# Patient Record
Sex: Female | Born: 1955
Health system: Southern US, Community
[De-identification: ages and names within clinical notes are randomized; demographics above are authoritative.]

## PROBLEM LIST (undated history)

## (undated) DIAGNOSIS — I82403 Acute embolism and thrombosis of unspecified deep veins of lower extremity, bilateral: Secondary | ICD-10-CM

## (undated) DIAGNOSIS — N12 Tubulo-interstitial nephritis, not specified as acute or chronic: Secondary | ICD-10-CM

## (undated) DIAGNOSIS — J9601 Acute respiratory failure with hypoxia: Secondary | ICD-10-CM

## (undated) DIAGNOSIS — G43909 Migraine, unspecified, not intractable, without status migrainosus: Secondary | ICD-10-CM

## (undated) DIAGNOSIS — T827XXA Infection and inflammatory reaction due to other cardiac and vascular devices, implants and grafts, initial encounter: Secondary | ICD-10-CM

## (undated) DIAGNOSIS — J189 Pneumonia, unspecified organism: Secondary | ICD-10-CM

## (undated) DIAGNOSIS — I10 Essential (primary) hypertension: Secondary | ICD-10-CM

## (undated) DIAGNOSIS — E039 Hypothyroidism, unspecified: Secondary | ICD-10-CM

## (undated) DIAGNOSIS — N189 Chronic kidney disease, unspecified: Secondary | ICD-10-CM

## (undated) DIAGNOSIS — Z6839 Body mass index (BMI) 39.0-39.9, adult: Secondary | ICD-10-CM

## (undated) DIAGNOSIS — I509 Heart failure, unspecified: Secondary | ICD-10-CM

## (undated) DIAGNOSIS — R0600 Dyspnea, unspecified: Secondary | ICD-10-CM

## (undated) DIAGNOSIS — I2699 Other pulmonary embolism without acute cor pulmonale: Secondary | ICD-10-CM

## (undated) DIAGNOSIS — I639 Cerebral infarction, unspecified: Secondary | ICD-10-CM

## (undated) DIAGNOSIS — R079 Chest pain, unspecified: Secondary | ICD-10-CM

## (undated) DIAGNOSIS — R0602 Shortness of breath: Secondary | ICD-10-CM

## (undated) HISTORY — DX: Acute embolism and thrombosis of unspecified deep veins of lower extremity, bilateral: I82.403

## (undated) HISTORY — PX: CHOLECYSTECTOMY: SHX55

## (undated) HISTORY — DX: Pneumonia, unspecified organism: J18.9

## (undated) HISTORY — DX: Chest pain, unspecified: R07.9

## (undated) HISTORY — DX: Acute respiratory failure with hypoxia: J96.01

## (undated) HISTORY — DX: Shortness of breath: R06.02

## (undated) HISTORY — DX: Other pulmonary embolism without acute cor pulmonale: I26.99

## (undated) HISTORY — DX: Body mass index (BMI) 39.0-39.9, adult: Z68.39

## (undated) HISTORY — DX: Tubulo-interstitial nephritis, not specified as acute or chronic: N12

## (undated) HISTORY — DX: Infection and inflammatory reaction due to other cardiac and vascular devices, implants and grafts, initial encounter: T82.7XXA

---

## 2004-09-08 HISTORY — PX: CARDIAC CATHETERIZATION: SHX172

## 2015-05-16 DIAGNOSIS — L509 Urticaria, unspecified: Secondary | ICD-10-CM

## 2015-05-16 DIAGNOSIS — L299 Pruritus, unspecified: Secondary | ICD-10-CM | POA: Insufficient documentation

## 2015-05-16 HISTORY — DX: Urticaria, unspecified: L50.9

## 2015-05-16 HISTORY — DX: Pruritus, unspecified: L29.9

## 2015-06-18 ENCOUNTER — Ambulatory Visit: Payer: Medicare PPO | Admitting: Allergy and Immunology

## 2015-07-02 ENCOUNTER — Ambulatory Visit: Payer: Medicare PPO | Admitting: Allergy and Immunology

## 2015-11-08 DIAGNOSIS — M545 Low back pain: Secondary | ICD-10-CM | POA: Diagnosis not present

## 2015-11-08 DIAGNOSIS — R358 Other polyuria: Secondary | ICD-10-CM | POA: Diagnosis not present

## 2015-11-08 DIAGNOSIS — N3091 Cystitis, unspecified with hematuria: Secondary | ICD-10-CM | POA: Diagnosis not present

## 2015-11-21 DIAGNOSIS — K529 Noninfective gastroenteritis and colitis, unspecified: Secondary | ICD-10-CM | POA: Diagnosis not present

## 2015-11-22 DIAGNOSIS — R109 Unspecified abdominal pain: Secondary | ICD-10-CM | POA: Diagnosis not present

## 2015-11-22 DIAGNOSIS — K429 Umbilical hernia without obstruction or gangrene: Secondary | ICD-10-CM | POA: Diagnosis not present

## 2016-02-12 DIAGNOSIS — M542 Cervicalgia: Secondary | ICD-10-CM | POA: Diagnosis not present

## 2016-02-12 DIAGNOSIS — K861 Other chronic pancreatitis: Secondary | ICD-10-CM | POA: Diagnosis not present

## 2016-04-12 DIAGNOSIS — E785 Hyperlipidemia, unspecified: Secondary | ICD-10-CM | POA: Diagnosis not present

## 2016-04-12 DIAGNOSIS — N189 Chronic kidney disease, unspecified: Secondary | ICD-10-CM | POA: Diagnosis not present

## 2016-04-12 DIAGNOSIS — F419 Anxiety disorder, unspecified: Secondary | ICD-10-CM | POA: Diagnosis not present

## 2016-04-12 DIAGNOSIS — I509 Heart failure, unspecified: Secondary | ICD-10-CM | POA: Diagnosis not present

## 2016-04-12 DIAGNOSIS — N183 Chronic kidney disease, stage 3 (moderate): Secondary | ICD-10-CM | POA: Diagnosis not present

## 2016-04-12 DIAGNOSIS — R4182 Altered mental status, unspecified: Secondary | ICD-10-CM | POA: Diagnosis not present

## 2016-04-12 DIAGNOSIS — E7800 Pure hypercholesterolemia, unspecified: Secondary | ICD-10-CM | POA: Diagnosis not present

## 2016-04-12 DIAGNOSIS — E039 Hypothyroidism, unspecified: Secondary | ICD-10-CM | POA: Diagnosis not present

## 2016-04-12 DIAGNOSIS — I129 Hypertensive chronic kidney disease with stage 1 through stage 4 chronic kidney disease, or unspecified chronic kidney disease: Secondary | ICD-10-CM | POA: Diagnosis not present

## 2016-04-12 DIAGNOSIS — K219 Gastro-esophageal reflux disease without esophagitis: Secondary | ICD-10-CM | POA: Diagnosis not present

## 2016-04-12 DIAGNOSIS — I130 Hypertensive heart and chronic kidney disease with heart failure and stage 1 through stage 4 chronic kidney disease, or unspecified chronic kidney disease: Secondary | ICD-10-CM | POA: Diagnosis not present

## 2016-04-12 DIAGNOSIS — I639 Cerebral infarction, unspecified: Secondary | ICD-10-CM | POA: Diagnosis not present

## 2016-04-13 DIAGNOSIS — R9431 Abnormal electrocardiogram [ECG] [EKG]: Secondary | ICD-10-CM | POA: Diagnosis not present

## 2016-04-13 DIAGNOSIS — I639 Cerebral infarction, unspecified: Secondary | ICD-10-CM | POA: Diagnosis not present

## 2016-04-14 DIAGNOSIS — R4781 Slurred speech: Secondary | ICD-10-CM | POA: Diagnosis not present

## 2016-04-14 DIAGNOSIS — R079 Chest pain, unspecified: Secondary | ICD-10-CM | POA: Diagnosis not present

## 2016-04-22 DIAGNOSIS — E785 Hyperlipidemia, unspecified: Secondary | ICD-10-CM | POA: Diagnosis not present

## 2016-04-22 DIAGNOSIS — Z1389 Encounter for screening for other disorder: Secondary | ICD-10-CM | POA: Diagnosis not present

## 2016-04-22 DIAGNOSIS — Z8673 Personal history of transient ischemic attack (TIA), and cerebral infarction without residual deficits: Secondary | ICD-10-CM | POA: Diagnosis not present

## 2016-06-07 DIAGNOSIS — R1013 Epigastric pain: Secondary | ICD-10-CM | POA: Diagnosis not present

## 2016-06-07 DIAGNOSIS — R079 Chest pain, unspecified: Secondary | ICD-10-CM | POA: Diagnosis not present

## 2016-06-07 DIAGNOSIS — R0602 Shortness of breath: Secondary | ICD-10-CM | POA: Diagnosis not present

## 2016-06-09 ENCOUNTER — Other Ambulatory Visit: Payer: Self-pay | Admitting: Pharmacist

## 2016-06-09 NOTE — Patient Outreach (Signed)
Outreach call to [employer] regarding her request for follow up from the [facility] Medication Adherence Campaign. HIPAA identifiers verified and verbal consent received.   Patient reports that she has been taking her atorvastatin once daily as directed. However, reports that her dose has been reduced and she is currently taking atorvastatin 20 mg daily. Denies any missed doses or any barriers to taking her medications such as cost or side effects.   Patient reports that she has no medication questions or concerns at this time.  Harlow Asa, PharmD Clinical Pharmacist [facility] [phone]

## 2016-07-18 DIAGNOSIS — Z0000 Encounter for general adult medical examination without abnormal findings: Secondary | ICD-10-CM | POA: Diagnosis not present

## 2016-07-18 DIAGNOSIS — N390 Urinary tract infection, site not specified: Secondary | ICD-10-CM | POA: Diagnosis not present

## 2016-07-18 DIAGNOSIS — R300 Dysuria: Secondary | ICD-10-CM | POA: Diagnosis not present

## 2016-07-23 DIAGNOSIS — Z23 Encounter for immunization: Secondary | ICD-10-CM | POA: Diagnosis not present

## 2016-07-23 DIAGNOSIS — Z0000 Encounter for general adult medical examination without abnormal findings: Secondary | ICD-10-CM | POA: Diagnosis not present

## 2016-07-24 DIAGNOSIS — N318 Other neuromuscular dysfunction of bladder: Secondary | ICD-10-CM | POA: Diagnosis not present

## 2016-07-24 DIAGNOSIS — N3000 Acute cystitis without hematuria: Secondary | ICD-10-CM | POA: Diagnosis not present

## 2016-07-24 DIAGNOSIS — N358 Other urethral stricture: Secondary | ICD-10-CM | POA: Diagnosis not present

## 2016-07-24 DIAGNOSIS — N3021 Other chronic cystitis with hematuria: Secondary | ICD-10-CM | POA: Diagnosis not present

## 2016-07-29 DIAGNOSIS — M1711 Unilateral primary osteoarthritis, right knee: Secondary | ICD-10-CM | POA: Diagnosis not present

## 2016-08-04 DIAGNOSIS — Z1231 Encounter for screening mammogram for malignant neoplasm of breast: Secondary | ICD-10-CM | POA: Diagnosis not present

## 2016-08-05 DIAGNOSIS — N3020 Other chronic cystitis without hematuria: Secondary | ICD-10-CM | POA: Diagnosis not present

## 2016-08-08 DIAGNOSIS — S60222A Contusion of left hand, initial encounter: Secondary | ICD-10-CM | POA: Diagnosis not present

## 2016-09-18 DIAGNOSIS — N179 Acute kidney failure, unspecified: Secondary | ICD-10-CM | POA: Diagnosis not present

## 2016-09-18 DIAGNOSIS — R112 Nausea with vomiting, unspecified: Secondary | ICD-10-CM | POA: Diagnosis not present

## 2016-09-18 DIAGNOSIS — R1084 Generalized abdominal pain: Secondary | ICD-10-CM | POA: Diagnosis not present

## 2016-09-18 DIAGNOSIS — N3000 Acute cystitis without hematuria: Secondary | ICD-10-CM | POA: Diagnosis not present

## 2016-09-19 DIAGNOSIS — I10 Essential (primary) hypertension: Secondary | ICD-10-CM

## 2016-09-19 DIAGNOSIS — N390 Urinary tract infection, site not specified: Secondary | ICD-10-CM | POA: Diagnosis not present

## 2016-09-19 DIAGNOSIS — R109 Unspecified abdominal pain: Secondary | ICD-10-CM | POA: Diagnosis not present

## 2016-09-19 DIAGNOSIS — R1084 Generalized abdominal pain: Secondary | ICD-10-CM | POA: Diagnosis not present

## 2016-09-19 DIAGNOSIS — E785 Hyperlipidemia, unspecified: Secondary | ICD-10-CM

## 2016-09-19 DIAGNOSIS — Z8673 Personal history of transient ischemic attack (TIA), and cerebral infarction without residual deficits: Secondary | ICD-10-CM

## 2016-09-19 DIAGNOSIS — N3000 Acute cystitis without hematuria: Secondary | ICD-10-CM | POA: Diagnosis not present

## 2016-09-19 DIAGNOSIS — R112 Nausea with vomiting, unspecified: Secondary | ICD-10-CM | POA: Diagnosis not present

## 2016-09-19 DIAGNOSIS — N183 Chronic kidney disease, stage 3 (moderate): Secondary | ICD-10-CM | POA: Diagnosis not present

## 2016-09-19 DIAGNOSIS — Z6841 Body Mass Index (BMI) 40.0 and over, adult: Secondary | ICD-10-CM | POA: Diagnosis not present

## 2016-09-19 DIAGNOSIS — Z79899 Other long term (current) drug therapy: Secondary | ICD-10-CM | POA: Diagnosis not present

## 2016-09-19 DIAGNOSIS — K219 Gastro-esophageal reflux disease without esophagitis: Secondary | ICD-10-CM | POA: Diagnosis not present

## 2016-09-19 DIAGNOSIS — E039 Hypothyroidism, unspecified: Secondary | ICD-10-CM

## 2016-09-19 DIAGNOSIS — N179 Acute kidney failure, unspecified: Secondary | ICD-10-CM | POA: Diagnosis not present

## 2016-09-21 NOTE — Progress Notes (Signed)
This is a no charge note   Transfer from [facility] per Dr. Dwyane Dee  61 year old lady with past medical history for hyperlipidemia, GERD, hypothyroidism, depression, pancreatitis, who presents with nausea, vomiting, abdominal pain for 3 days. Patient does not have diarrhea, fever or chills. CT abdomen/pelvis showed hiatal hernia, otherwise not impressive. Patient was found to have acute renal injury with Cre up to 7.2, BUN 53. Patient was treated with IV fluid for 48 hours with mild improvement of kidney function, creatinine improved from 7.2-->6.3. Patient had 4.5 L urine output today. CBC on admission normal. Vital signs stable. No fever, O2 sat 95% on room air, blood pressure 159/78, heart rate 80, respiration rate 18. Pt is accepted to med-surg bed as inpt. Renal, Dr. Jonnie Finner was consulted by EDP, will see pt in AM.  Ivor Costa, MD  [facility] Pager [phone]  If 7PM-7AM, please contact night-coverage [email or web address] Password [facility] 09/21/2016, 9:29 PM

## 2016-09-22 ENCOUNTER — Encounter (HOSPITAL_COMMUNITY): Payer: Self-pay | Admitting: Internal Medicine

## 2016-09-22 ENCOUNTER — Inpatient Hospital Stay (HOSPITAL_COMMUNITY): Payer: Medicare HMO

## 2016-09-22 ENCOUNTER — Inpatient Hospital Stay (HOSPITAL_COMMUNITY)
Admission: AD | Admit: 2016-09-22 | Discharge: 2016-10-14 | DRG: 682 | Disposition: A | Discharge status: Home / Self Care | Payer: Medicare HMO | Source: Other Acute Inpatient Hospital | Attending: Internal Medicine | Admitting: Internal Medicine

## 2016-09-22 DIAGNOSIS — K449 Diaphragmatic hernia without obstruction or gangrene: Secondary | ICD-10-CM | POA: Diagnosis present

## 2016-09-22 DIAGNOSIS — N183 Chronic kidney disease, stage 3 (moderate): Secondary | ICD-10-CM | POA: Diagnosis present

## 2016-09-22 DIAGNOSIS — N289 Disorder of kidney and ureter, unspecified: Secondary | ICD-10-CM | POA: Diagnosis not present

## 2016-09-22 DIAGNOSIS — K5900 Constipation, unspecified: Secondary | ICD-10-CM | POA: Diagnosis present

## 2016-09-22 DIAGNOSIS — N12 Tubulo-interstitial nephritis, not specified as acute or chronic: Secondary | ICD-10-CM | POA: Diagnosis not present

## 2016-09-22 DIAGNOSIS — K219 Gastro-esophageal reflux disease without esophagitis: Secondary | ICD-10-CM | POA: Diagnosis present

## 2016-09-22 DIAGNOSIS — Z6841 Body Mass Index (BMI) 40.0 and over, adult: Secondary | ICD-10-CM

## 2016-09-22 DIAGNOSIS — Z8249 Family history of ischemic heart disease and other diseases of the circulatory system: Secondary | ICD-10-CM

## 2016-09-22 DIAGNOSIS — Z8673 Personal history of transient ischemic attack (TIA), and cerebral infarction without residual deficits: Secondary | ICD-10-CM

## 2016-09-22 DIAGNOSIS — E876 Hypokalemia: Secondary | ICD-10-CM | POA: Diagnosis not present

## 2016-09-22 DIAGNOSIS — E039 Hypothyroidism, unspecified: Secondary | ICD-10-CM | POA: Diagnosis not present

## 2016-09-22 DIAGNOSIS — I10 Essential (primary) hypertension: Secondary | ICD-10-CM | POA: Diagnosis not present

## 2016-09-22 DIAGNOSIS — D631 Anemia in chronic kidney disease: Secondary | ICD-10-CM | POA: Diagnosis not present

## 2016-09-22 DIAGNOSIS — J189 Pneumonia, unspecified organism: Secondary | ICD-10-CM | POA: Diagnosis not present

## 2016-09-22 DIAGNOSIS — N170 Acute kidney failure with tubular necrosis: Secondary | ICD-10-CM | POA: Diagnosis not present

## 2016-09-22 DIAGNOSIS — R059 Cough, unspecified: Secondary | ICD-10-CM

## 2016-09-22 DIAGNOSIS — I130 Hypertensive heart and chronic kidney disease with heart failure and stage 1 through stage 4 chronic kidney disease, or unspecified chronic kidney disease: Secondary | ICD-10-CM | POA: Diagnosis present

## 2016-09-22 DIAGNOSIS — N179 Acute kidney failure, unspecified: Principal | ICD-10-CM | POA: Diagnosis present

## 2016-09-22 DIAGNOSIS — D649 Anemia, unspecified: Secondary | ICD-10-CM | POA: Diagnosis not present

## 2016-09-22 DIAGNOSIS — N10 Acute tubulo-interstitial nephritis: Secondary | ICD-10-CM | POA: Diagnosis present

## 2016-09-22 DIAGNOSIS — D638 Anemia in other chronic diseases classified elsewhere: Secondary | ICD-10-CM | POA: Diagnosis present

## 2016-09-22 DIAGNOSIS — Z880 Allergy status to penicillin: Secondary | ICD-10-CM

## 2016-09-22 DIAGNOSIS — E669 Obesity, unspecified: Secondary | ICD-10-CM | POA: Diagnosis present

## 2016-09-22 DIAGNOSIS — Z7902 Long term (current) use of antithrombotics/antiplatelets: Secondary | ICD-10-CM | POA: Diagnosis not present

## 2016-09-22 DIAGNOSIS — E785 Hyperlipidemia, unspecified: Secondary | ICD-10-CM | POA: Diagnosis present

## 2016-09-22 DIAGNOSIS — E872 Acidosis: Secondary | ICD-10-CM | POA: Diagnosis present

## 2016-09-22 DIAGNOSIS — R2689 Other abnormalities of gait and mobility: Secondary | ICD-10-CM

## 2016-09-22 DIAGNOSIS — R05 Cough: Secondary | ICD-10-CM | POA: Diagnosis not present

## 2016-09-22 DIAGNOSIS — K861 Other chronic pancreatitis: Secondary | ICD-10-CM | POA: Diagnosis present

## 2016-09-22 DIAGNOSIS — R0602 Shortness of breath: Secondary | ICD-10-CM | POA: Diagnosis not present

## 2016-09-22 DIAGNOSIS — Z882 Allergy status to sulfonamides status: Secondary | ICD-10-CM

## 2016-09-22 DIAGNOSIS — Z4901 Encounter for fitting and adjustment of extracorporeal dialysis catheter: Secondary | ICD-10-CM | POA: Diagnosis not present

## 2016-09-22 DIAGNOSIS — N390 Urinary tract infection, site not specified: Secondary | ICD-10-CM | POA: Diagnosis not present

## 2016-09-22 DIAGNOSIS — Z79899 Other long term (current) drug therapy: Secondary | ICD-10-CM

## 2016-09-22 DIAGNOSIS — N189 Chronic kidney disease, unspecified: Secondary | ICD-10-CM | POA: Diagnosis not present

## 2016-09-22 DIAGNOSIS — D696 Thrombocytopenia, unspecified: Secondary | ICD-10-CM | POA: Diagnosis not present

## 2016-09-22 DIAGNOSIS — R112 Nausea with vomiting, unspecified: Secondary | ICD-10-CM | POA: Diagnosis not present

## 2016-09-22 DIAGNOSIS — N185 Chronic kidney disease, stage 5: Secondary | ICD-10-CM

## 2016-09-22 DIAGNOSIS — K8681 Exocrine pancreatic insufficiency: Secondary | ICD-10-CM | POA: Diagnosis present

## 2016-09-22 DIAGNOSIS — I951 Orthostatic hypotension: Secondary | ICD-10-CM | POA: Diagnosis present

## 2016-09-22 DIAGNOSIS — R100 Acute abdomen: Secondary | ICD-10-CM | POA: Diagnosis not present

## 2016-09-22 HISTORY — DX: Urinary tract infection, site not specified: N39.0

## 2016-09-22 HISTORY — DX: Cerebral infarction, unspecified: I63.9

## 2016-09-22 HISTORY — DX: Chronic kidney disease, unspecified: N18.9

## 2016-09-22 HISTORY — DX: Heart failure, unspecified: I50.9

## 2016-09-22 HISTORY — DX: Hypothyroidism, unspecified: E03.9

## 2016-09-22 HISTORY — DX: Essential (primary) hypertension: I10

## 2016-09-22 HISTORY — DX: Nausea with vomiting, unspecified: R11.2

## 2016-09-22 HISTORY — DX: Chronic kidney disease, stage 5: N17.9

## 2016-09-22 LAB — CBC
HCT: 31.7 % — ABNORMAL LOW (ref 36.0–46.0)
Hemoglobin: 10 g/dL — ABNORMAL LOW (ref 12.0–15.0)
MCH: 25.3 pg — AB (ref 26.0–34.0)
MCHC: 31.5 g/dL (ref 30.0–36.0)
MCV: 80.3 fL (ref 78.0–100.0)
Platelets: 167 10*3/uL (ref 150–400)
RBC: 3.95 MIL/uL (ref 3.87–5.11)
RDW: 17 % — ABNORMAL HIGH (ref 11.5–15.5)
WBC: 6.2 10*3/uL (ref 4.0–10.5)

## 2016-09-22 LAB — URINALYSIS, ROUTINE W REFLEX MICROSCOPIC
Bacteria, UA: NONE SEEN
Bilirubin Urine: NEGATIVE
Glucose, UA: >=500 mg/dL — AB
KETONES UR: NEGATIVE mg/dL
Nitrite: NEGATIVE
PROTEIN: NEGATIVE mg/dL
Specific Gravity, Urine: 1.005 (ref 1.005–1.030)
Squamous Epithelial / LPF: NONE SEEN
pH: 7 (ref 5.0–8.0)

## 2016-09-22 LAB — COMPREHENSIVE METABOLIC PANEL
ALT: 10 U/L — ABNORMAL LOW (ref 14–54)
ANION GAP: 12 (ref 5–15)
AST: 12 U/L — ABNORMAL LOW (ref 15–41)
Albumin: 2.7 g/dL — ABNORMAL LOW (ref 3.5–5.0)
Alkaline Phosphatase: 54 U/L (ref 38–126)
BUN: 32 mg/dL — ABNORMAL HIGH (ref 6–20)
CHLORIDE: 116 mmol/L — AB (ref 101–111)
CO2: 16 mmol/L — AB (ref 22–32)
Calcium: 8.5 mg/dL — ABNORMAL LOW (ref 8.9–10.3)
Creatinine, Ser: 6.19 mg/dL — ABNORMAL HIGH (ref 0.44–1.00)
GFR calc non Af Amer: 7 mL/min — ABNORMAL LOW (ref >60)
GFR, EST AFRICAN AMERICAN: 8 mL/min — AB (ref >60)
Glucose, Bld: 94 mg/dL (ref 65–99)
POTASSIUM: 3.7 mmol/L (ref 3.5–5.1)
SODIUM: 144 mmol/L (ref 135–145)
Total Bilirubin: 0.4 mg/dL (ref 0.3–1.2)
Total Protein: 6 g/dL — ABNORMAL LOW (ref 6.5–8.1)

## 2016-09-22 LAB — PROTEIN / CREATININE RATIO, URINE
Creatinine, Urine: 25.31 mg/dL
Protein Creatinine Ratio: 1.58 mg/mg{Cre} — ABNORMAL HIGH (ref 0.00–0.15)
Total Protein, Urine: 40 mg/dL

## 2016-09-22 LAB — BASIC METABOLIC PANEL
Anion gap: 6 (ref 5–15)
BUN: 33 mg/dL — AB (ref 6–20)
CALCIUM: 8.1 mg/dL — AB (ref 8.9–10.3)
CO2: 18 mmol/L — ABNORMAL LOW (ref 22–32)
Chloride: 117 mmol/L — ABNORMAL HIGH (ref 101–111)
Creatinine, Ser: 6.06 mg/dL — ABNORMAL HIGH (ref 0.44–1.00)
GFR calc Af Amer: 8 mL/min — ABNORMAL LOW (ref >60)
GFR, EST NON AFRICAN AMERICAN: 7 mL/min — AB (ref >60)
GLUCOSE: 110 mg/dL — AB (ref 65–99)
POTASSIUM: 4 mmol/L (ref 3.5–5.1)
Sodium: 141 mmol/L (ref 135–145)

## 2016-09-22 LAB — OSMOLALITY, URINE: Osmolality, Ur: 320 mOsm/kg (ref 300–900)

## 2016-09-22 LAB — CREATININE, URINE, RANDOM: Creatinine, Urine: 25.4 mg/dL

## 2016-09-22 LAB — URIC ACID: Uric Acid, Serum: 3.3 mg/dL (ref 2.3–6.6)

## 2016-09-22 LAB — OSMOLALITY: Osmolality: 308 mOsm/kg — ABNORMAL HIGH (ref 275–295)

## 2016-09-22 LAB — SODIUM, URINE, RANDOM: Sodium, Ur: 115 mmol/L

## 2016-09-22 MED ORDER — CLOPIDOGREL BISULFATE 75 MG PO TABS
75.0000 mg | ORAL_TABLET | Freq: Every day | ORAL | Status: DC
  Administered 2016-09-22 – 2016-09-27 (×6): 75 mg via ORAL
  Filled 2016-09-22 (×6): qty 1

## 2016-09-22 MED ORDER — SODIUM CHLORIDE 0.9 % IV SOLN
INTRAVENOUS | Status: DC
  Administered 2016-09-22 – 2016-09-24 (×7): via INTRAVENOUS

## 2016-09-22 MED ORDER — ATORVASTATIN CALCIUM 40 MG PO TABS
40.0000 mg | ORAL_TABLET | Freq: Every day | ORAL | Status: DC
  Administered 2016-09-22 – 2016-09-26 (×5): 40 mg via ORAL
  Filled 2016-09-22 (×5): qty 1

## 2016-09-22 MED ORDER — CITALOPRAM HYDROBROMIDE 20 MG PO TABS
20.0000 mg | ORAL_TABLET | Freq: Every day | ORAL | Status: DC
  Administered 2016-09-22 – 2016-10-14 (×23): 20 mg via ORAL
  Filled 2016-09-22 (×23): qty 1

## 2016-09-22 MED ORDER — ONDANSETRON HCL 4 MG/2ML IJ SOLN
4.0000 mg | Freq: Four times a day (QID) | INTRAMUSCULAR | Status: DC | PRN
  Administered 2016-09-22 – 2016-10-08 (×4): 4 mg via INTRAVENOUS
  Filled 2016-09-22 (×5): qty 2

## 2016-09-22 MED ORDER — HEPARIN SODIUM (PORCINE) 5000 UNIT/ML IJ SOLN
5000.0000 [IU] | Freq: Three times a day (TID) | INTRAMUSCULAR | Status: DC
  Administered 2016-09-22 – 2016-09-30 (×22): 5000 [IU] via SUBCUTANEOUS
  Filled 2016-09-22 (×20): qty 1

## 2016-09-22 MED ORDER — PANTOPRAZOLE SODIUM 40 MG PO TBEC
40.0000 mg | DELAYED_RELEASE_TABLET | Freq: Every day | ORAL | Status: DC
  Administered 2016-09-22 – 2016-09-23 (×2): 40 mg via ORAL
  Filled 2016-09-22 (×2): qty 1

## 2016-09-22 MED ORDER — LEVOTHYROXINE SODIUM 100 MCG PO TABS
100.0000 ug | ORAL_TABLET | Freq: Every day | ORAL | Status: DC
  Administered 2016-09-22 – 2016-10-14 (×24): 100 ug via ORAL
  Filled 2016-09-22 (×24): qty 1

## 2016-09-22 MED ORDER — DEXTROSE 5 % IV SOLN
1.0000 g | INTRAVENOUS | Status: DC
  Administered 2016-09-22 – 2016-09-24 (×3): 1 g via INTRAVENOUS
  Filled 2016-09-22 (×3): qty 10

## 2016-09-22 NOTE — Progress Notes (Signed)
Despite very good UOP, creat is only down to 6.19 this morning.  Nephrology to consult in AM.

## 2016-09-22 NOTE — Progress Notes (Addendum)
[facility] PLAN OF CARE NOTE Patient: Rachael [name] [id number]:[id number]   PCP: No primary care provider on file. DOB: 12-06-1955   DOA: 09/22/2016   DOS: 09/22/2016    Patient was admitted by my colleague Dr. Alcario Drought  earlier on 09/22/2016. I have reviewed the H&P as well as assessment and plan and agree with the same. Important changes in the plan are listed below.  Plan of care: Principal Problem:   Acute renal failure superimposed on chronic kidney disease ([place]) Active Problems:   UTI (urinary tract infection)   Nausea and vomiting   HTN (hypertension)   Hypothyroidism will follow nephrology Recommendation. Not volume overloaded, not uremic.  Discuss with Dr Hollie Salk. CT abdomen in other facility 09/19/2016 shows no acute abnormalities other than hiatal hernia. Ultrasound abdomen 09/21/2016 shows no evidence of obstruction, no hydronephrosis, bilateral renal size roughly 9 cm Obtain urine sodium, urine analysis, urine creatinine, urine osmolality, urine urea nitrogen, creatinine recheck, serum osmolality, uric acid, SPEP.  Cough  Since 3 weeks ? Pleural effusion ? Pneumonia No acute abnormality on the chest x-ray.  Abnormal density on the x-ray. Patient will require a repeat follow-up x-ray in 4 week time period. If no resolution patient will require a noncontrast CT scan as an outpatient.  Author: Berle Mull, MD [facility] Pager: [phone] 09/22/2016 11:00 AM   If 7PM-7AM, please contact night-coverage at [email or web address], password [facility]

## 2016-09-22 NOTE — H&P (Signed)
History and Physical    Rachael [name] DVV:[id number] DOB: 20-Dec-1955 DOA: 09/22/2016   PCP: No primary care provider on file. Chief Complaint: No chief complaint on file.   HPI: Rachael [name] is a 61 y.o. female with medical history significant for hyperlipidemia, GERD, hypothyroidism, depression, pancreatitis, who presents with nausea, vomiting, abdominal pain for 3 days. Patient does not have diarrhea, fever or chills. CT abdomen/pelvis showed hiatal hernia, otherwise not impressive. Patient was admitted to [facility] on 1/11.  Patient was found to have acute renal injury with Cre up to 7.2, BUN 53. Patient was treated with IV fluid for 48 hours with mild improvement of kidney function, creatinine improved from 7.2-->6.3.  Last draw was 0500 on 1/14 which showed 6.3 creat.  Patient had 4.5 L urine output today, by report. CBC on admission normal.  Patient was transferred to [facility] due to concern for persistent AKF.   Review of Systems: As per HPI otherwise 10 point review of systems negative.    Past Medical History:  Diagnosis Date  . CHF (congestive heart failure) ([place])    Heart cath in 2006  . CKD (chronic kidney disease)    Baseline creat 1.4-1.7  . CVA (cerebral vascular accident) ([place])    No residual deficit  . HTN (hypertension)   . Hypothyroidism     Past Surgical History:  Procedure Laterality Date  . CARDIAC CATHETERIZATION  2006  . CHOLECYSTECTOMY       reports that she has never smoked. She does not have any smokeless tobacco history on file. She reports that she does not drink alcohol or use drugs.  Allergies  Allergen Reactions  . Penicillins Rash    Tolerates rocephin  . Sulfa Antibiotics Rash    Family History  Problem Relation Age of Onset  . Hypertension Mother   . Stroke Mother   . Coronary artery disease Mother     Late onset  . Hypertension Father   . Coronary artery disease Father     Late onset  . Hypertension Sister   . Hypertension  Brother       Prior to Admission medications   Medication Sig Start Date End Date Taking? Authorizing Provider  atorvastatin (LIPITOR) 20 MG tablet Take 20 mg by mouth daily.    Historical Provider, MD  cetirizine (ZYRTEC) 10 MG tablet Take 10 mg by mouth 2 (two) times daily.    Historical Provider, MD  citalopram (CELEXA) 40 MG tablet Take 40 mg by mouth daily.    Historical Provider, MD  clopidogrel (PLAVIX) 75 MG tablet Take 75 mg by mouth daily.    Historical Provider, MD  Fluocinolone Acetonide (DERMA-SMOOTHE/FS BODY) 0.01 % OIL Apply topically daily.    Historical Provider, MD  levothyroxine (SYNTHROID, LEVOTHROID) 100 MCG tablet Take 100 mcg by mouth daily before breakfast.    Historical Provider, MD  montelukast (SINGULAIR) 10 MG tablet Take 10 mg by mouth at bedtime.    Historical Provider, MD  pantoprazole (PROTONIX) 40 MG tablet Take 40 mg by mouth daily.    Historical Provider, MD  ranitidine (ZANTAC) 150 MG capsule Take 150 mg by mouth 2 (two) times daily.    Historical Provider, MD    Physical Exam: Vitals:   09/22/16 0155  BP: (!) 161/65  Pulse: 73  Resp: 18  Temp: 98.9 F (37.2 C)  TempSrc: Oral  SpO2: 99%  Weight: 106.7 kg (235 lb 3.7 oz)      Constitutional: NAD, calm,  comfortable Eyes: PERRL, lids and conjunctivae normal ENMT: Mucous membranes are moist. Posterior pharynx clear of any exudate or lesions.Normal dentition.  Neck: normal, supple, no masses, no thyromegaly Respiratory: clear to auscultation bilaterally, no wheezing, no crackles. Normal respiratory effort. No accessory muscle use.  Cardiovascular: Regular rate and rhythm, no murmurs / rubs / gallops. No extremity edema. 2+ pedal pulses. No carotid bruits.  Abdomen: no tenderness, no masses palpated. No hepatosplenomegaly. Bowel sounds positive.  Musculoskeletal: no clubbing / cyanosis. No joint deformity upper and lower extremities. Good ROM, no contractures. Normal muscle tone.  Skin: no rashes,  lesions, ulcers. No induration Neurologic: CN 2-12 grossly intact. Sensation intact, DTR normal. Strength 5/5 in all 4.  Psychiatric: Normal judgment and insight. Alert and oriented x 3. Normal mood.    Labs on Admission: I have personally reviewed following labs and imaging studies  CBC: No results for input(s): WBC, NEUTROABS, HGB, HCT, MCV, PLT in the last 168 hours. Basic Metabolic Panel: No results for input(s): NA, K, CL, CO2, GLUCOSE, BUN, CREATININE, CALCIUM, MG, PHOS in the last 168 hours. GFR: CrCl cannot be calculated (No order found.). Liver Function Tests: No results for input(s): AST, ALT, ALKPHOS, BILITOT, PROT, ALBUMIN in the last 168 hours. No results for input(s): LIPASE, AMYLASE in the last 168 hours. No results for input(s): AMMONIA in the last 168 hours. Coagulation Profile: No results for input(s): INR, PROTIME in the last 168 hours. Cardiac Enzymes: No results for input(s): CKTOTAL, CKMB, CKMBINDEX, TROPONINI in the last 168 hours. BNP (last 3 results) No results for input(s): PROBNP in the last 8760 hours. HbA1C: No results for input(s): HGBA1C in the last 72 hours. CBG: No results for input(s): GLUCAP in the last 168 hours. Lipid Profile: No results for input(s): CHOL, HDL, LDLCALC, TRIG, CHOLHDL, LDLDIRECT in the last 72 hours. Thyroid Function Tests: No results for input(s): TSH, T4TOTAL, FREET4, T3FREE, THYROIDAB in the last 72 hours. Anemia Panel: No results for input(s): VITAMINB12, FOLATE, FERRITIN, TIBC, IRON, RETICCTPCT in the last 72 hours. Urine analysis: No results found for: COLORURINE, APPEARANCEUR, LABSPEC, PHURINE, GLUCOSEU, HGBUR, BILIRUBINUR, KETONESUR, PROTEINUR, UROBILINOGEN, NITRITE, LEUKOCYTESUR Sepsis Labs: @LABRCNTIP (procalcitonin:4,lacticidven:4) )No results found for this or any previous visit (from the past 240 hour(s)).   Radiological Exams on Admission: No results found.  EKG: Independently  reviewed.  Assessment/Plan Principal Problem:   Acute renal failure superimposed on chronic kidney disease (HCC) Active Problems:   UTI (urinary tract infection)   Nausea and vomiting   HTN (hypertension)   Hypothyroidism    1. ARF on CKD - 1. Had creat of 6.3 almost 24 hours ago, however, reportedly had as much as 4.5L output today at [place].  This seems quite possible as she has already had 200cc UOP here at [facility] since arrival less than 2 hours ago! 2. I am suspicious that patient may be in the "post ATN diuresis" phase of renal recovery. 3. Sending down stat labs to see where electrolytes are and creatinine is. 4. Replace electrolyte deficiencies PRN 5. Continue NS at 125 cc/hr for now 6. Strict intake and output 7. EDP called nephrology for AM consult so they will see patient in AM (although if she is truly in the recovery diuresis phase of ATN then they may not need to do much or possibly even need to see the patient) 2. UTI - continue rocephin 3. N/V - PRN zofran, presumably due to UTI 4. HTN - home meds appear to be on hold 5. Hypothyroidism -  continue synthroid   DVT prophylaxis: Heparin [place] Code Status: Full Family Communication: No family in room Consults called: Dr. Jonnie Finner consulted by EDP and will see patient in AM Admission status: Admit to inpatient   Etta Quill DO [facility] Pager [phone] from 7PM-7AM  If 7AM-7PM, please contact the day physician for the patient [email or web address] Password [facility]  09/22/2016, 2:34 AM

## 2016-09-22 NOTE — Progress Notes (Signed)
New Admission Note:  Arrival Method: EMS stretcher Mental Orientation: Alert and oriented x 4 Telemetry: N/A Assessment: Completed Skin: Warm dry and intact  IV: NSL  Pain: Denies  Tubes: Foley  Safety Measures: Safety Fall Prevention Plan was given, discussed and signed. Admission: Completed [address] [place] Orientation: Patient has been orientated to the room, unit and the staff. Family: Non e  Orders have been reviewed and implemented. Will continue to monitor the patient. Call light has been placed within reach and bed alarm has been activated.   Sima Matas BSN, RN  Phone Number: [phone]

## 2016-09-22 NOTE — Consult Note (Signed)
[facility] Consult Note     Date: 09/22/2016                  Patient Name:  Rachael [name]  MRN: [id number]  DOB: [birth year]  Age / Sex: 61 y.o., female         PCP: No primary care provider on file.                 Service Requesting Consult: [facility]                 Reason for Consult: Acute on chronic CKD            Chief Complaint: n/v  HPI: Pt is a 73F with a PMH significant for CHF, CKD Stage III, h/o CVA, hypothyroidism, and chronic pancreatitis (per pt) who is now seen in consultation at the request of Dr. Posey Pronto for evaluation and recommendations surroudning acute on chronic CKD.  Briefly, pt started having n/v with poor PO intake starting on Dec 30th.  Tried to treat at home with fluids and bowel rest as able.  Vomited 2-3 times daily.  Didn't get much better so went to ED at [facility] 1/11 where CT abd/pelvis was done showing hiatal hernia. Cr was up to 7.3.  IVF x 48 hours, Cr down to 6.19.  Transferred here for further management.  Denies NSAIDs, ACEi, or OTC supplements.  Still urinating.  US abdomen without hydro.  Past Medical History:  Diagnosis Date  . CHF (congestive heart failure) ([place])    Heart cath in 2006  . CKD (chronic kidney disease)    Baseline creat 1.4-1.7  . CVA (cerebral vascular accident) ([place])    No residual deficit  . HTN (hypertension)   . Hypothyroidism     Past Surgical History:  Procedure Laterality Date  . CARDIAC CATHETERIZATION  2006  . CHOLECYSTECTOMY      Family History  Problem Relation Age of Onset  . Hypertension Mother   . Stroke Mother   . Coronary artery disease Mother     Late onset  . Hypertension Father   . Coronary artery disease Father     Late onset  . Hypertension Sister   . Hypertension Brother    Social History:  reports that she has never smoked. She has never used smokeless tobacco. She reports that she does not drink alcohol or use drugs.  Allergies:  Allergies  Allergen  Reactions  . Penicillins Rash and Other (See Comments)    Tolerates rocephin  . Sulfa Antibiotics Rash    Medications Prior to Admission  Medication Sig Dispense Refill  . atorvastatin (LIPITOR) 40 MG tablet Take 40 mg by mouth daily.     . citalopram (CELEXA) 40 MG tablet Take 40 mg by mouth daily.    . clopidogrel (PLAVIX) 75 MG tablet Take 75 mg by mouth daily.    [name] levothyroxine (SYNTHROID, LEVOTHROID) 100 MCG tablet Take 100 mcg by mouth daily before breakfast.    . pantoprazole (PROTONIX) 40 MG tablet Take 40 mg by mouth daily.      Results for orders placed or performed during the hospital encounter of 09/22/16 (from the past 48 hour(s))  Comprehensive metabolic panel     Status: Abnormal   Collection Time: 09/22/16  2:20 AM  Result Value Ref Range   Sodium 144 135 - 145 mmol/L   Potassium 3.7 3.5 - 5.1 mmol/L   Chloride 116 (H) 101 - 111 mmol/L  CO2 16 (L) 22 - 32 mmol/L   Glucose, Bld 94 65 - 99 mg/dL   BUN 32 (H) 6 - 20 mg/dL   Creatinine, Ser 6.19 (H) 0.44 - 1.00 mg/dL   Calcium 8.5 (L) 8.9 - 10.3 mg/dL   Total Protein 6.0 (L) 6.5 - 8.1 g/dL   Albumin 2.7 (L) 3.5 - 5.0 g/dL   AST 12 (L) 15 - 41 U/L   ALT 10 (L) 14 - 54 U/L   Alkaline Phosphatase 54 38 - 126 U/L   Total Bilirubin 0.4 0.3 - 1.2 mg/dL   GFR calc non Af Amer 7 (L) >60 mL/min   GFR calc Af Amer 8 (L) >60 mL/min    Comment: (NOTE) The eGFR has been calculated using the CKD EPI equation. This calculation has not been validated in all clinical situations. eGFR's persistently <60 mL/min signify possible Chronic Kidney Disease.    Anion gap 12 5 - 15  CBC     Status: Abnormal   Collection Time: 09/22/16  2:20 AM  Result Value Ref Range   WBC 6.2 4.0 - 10.5 K/uL   RBC 3.95 3.87 - 5.11 MIL/uL   Hemoglobin 10.0 (L) 12.0 - 15.0 g/dL   HCT 31.7 (L) 36.0 - 46.0 %   MCV 80.3 78.0 - 100.0 fL   MCH 25.3 (L) 26.0 - 34.0 pg   MCHC 31.5 30.0 - 36.0 g/dL   RDW 17.0 (H) 11.5 - 15.5 %   Platelets 167 150 - 400  K/uL   Dg Chest 2 View  Result Date: 09/22/2016 CLINICAL DATA:  Nonproductive cough.  Shortness of breath EXAM: CHEST  2 VIEW COMPARISON:  CT 06/07/2016 . FINDINGS: Mediastinum and hilar structures normal. Cardiomegaly with normal pulmonary vascularity. Questionable density is noted over the anterior lung bases on lateral view. Follow-up PA lateral chest x-ray is suggested. This density persists CT can be obtained. IMPRESSION: 1. Questionable density is noted over the anterior aspect of the lower lung bases on lateral view. Follow-up chest x-ray suggested for further evaluation. This density persists nonenhanced chest CT is suggested to further evaluate. 2. Cardiomegaly.  No pulmonary venous congestion. Electronically Signed   By: Marcello Moores  Register   On: 09/22/2016 12:09    ROS: all other systems reviewed and are negative except as per HPI  Blood pressure (!) 138/51, pulse 83, temperature 98.3 F (36.8 C), temperature source Oral, resp. rate 17, height 5' 2"  (1.575 m), weight 106 kg (233 lb 11 oz), SpO2 96 %. Physical Exam  GEN: laying in bed, NAD HEENT: EOMI, PERRL, MMM NECK: supple PULM: CTAB CV RRR ABD mildly diffusely tender EXT; no LE edema NEURO AAO x 4  Assessment/Plan  1.  Acute kidney injury superimposed on CKD III- likely due to prolonged PO intake.  Still urinating- non, oliguric.  Expect this to take several days to resolve.  No indications for HD at present.  Will send UA UP/C, SPEP, serum free light chains as initial workup.  2.  Anion gap metabolic acidosis: PO sodium bicarb 1300 TID  3.  Chronic pancreatitis: unclear if this is the precipitant of her n/v  4.  UTI- on CTX.     Madelon Lips MD 09/22/2016, 3:54 PM

## 2016-09-23 LAB — CBC
HCT: 30.9 % — ABNORMAL LOW (ref 36.0–46.0)
Hemoglobin: 9.8 g/dL — ABNORMAL LOW (ref 12.0–15.0)
MCH: 25.3 pg — AB (ref 26.0–34.0)
MCHC: 31.7 g/dL (ref 30.0–36.0)
MCV: 79.8 fL (ref 78.0–100.0)
PLATELETS: 143 10*3/uL — AB (ref 150–400)
RBC: 3.87 MIL/uL (ref 3.87–5.11)
RDW: 16.9 % — ABNORMAL HIGH (ref 11.5–15.5)
WBC: 5.5 10*3/uL (ref 4.0–10.5)

## 2016-09-23 LAB — RENAL FUNCTION PANEL
Albumin: 2.5 g/dL — ABNORMAL LOW (ref 3.5–5.0)
Anion gap: 8 (ref 5–15)
BUN: 31 mg/dL — ABNORMAL HIGH (ref 6–20)
CALCIUM: 8.1 mg/dL — AB (ref 8.9–10.3)
CO2: 16 mmol/L — ABNORMAL LOW (ref 22–32)
CREATININE: 5.82 mg/dL — AB (ref 0.44–1.00)
Chloride: 120 mmol/L — ABNORMAL HIGH (ref 101–111)
GFR calc Af Amer: 8 mL/min — ABNORMAL LOW (ref >60)
GFR calc non Af Amer: 7 mL/min — ABNORMAL LOW (ref >60)
GLUCOSE: 94 mg/dL (ref 65–99)
Phosphorus: 4.6 mg/dL (ref 2.5–4.6)
Potassium: 4 mmol/L (ref 3.5–5.1)
SODIUM: 144 mmol/L (ref 135–145)

## 2016-09-23 LAB — PROTEIN ELECTROPHORESIS, SERUM
A/G Ratio: 0.9 (ref 0.7–1.7)
Albumin ELP: 2.6 g/dL — ABNORMAL LOW (ref 2.9–4.4)
Alpha-1-Globulin: 0.3 g/dL (ref 0.0–0.4)
Alpha-2-Globulin: 0.9 g/dL (ref 0.4–1.0)
Beta Globulin: 0.9 g/dL (ref 0.7–1.3)
GLOBULIN, TOTAL: 3 g/dL (ref 2.2–3.9)
Gamma Globulin: 0.9 g/dL (ref 0.4–1.8)
PDF: 0
TOTAL PROTEIN ELP: 5.6 g/dL — AB (ref 6.0–8.5)

## 2016-09-23 LAB — URINE CULTURE: CULTURE: NO GROWTH

## 2016-09-23 LAB — UREA NITROGEN, URINE: UREA NITROGEN UR: 122 mg/dL

## 2016-09-23 MED ORDER — PROMETHAZINE HCL 25 MG/ML IJ SOLN
12.5000 mg | Freq: Four times a day (QID) | INTRAMUSCULAR | Status: DC | PRN
  Administered 2016-09-23 – 2016-10-04 (×7): 12.5 mg via INTRAVENOUS
  Filled 2016-09-23 (×9): qty 1

## 2016-09-23 MED ORDER — SODIUM BICARBONATE 650 MG PO TABS
1300.0000 mg | ORAL_TABLET | Freq: Two times a day (BID) | ORAL | Status: DC
  Administered 2016-09-23: 1300 mg via ORAL
  Filled 2016-09-23 (×2): qty 2

## 2016-09-23 MED ORDER — SODIUM BICARBONATE 650 MG PO TABS
1300.0000 mg | ORAL_TABLET | Freq: Three times a day (TID) | ORAL | Status: DC
  Administered 2016-09-23 – 2016-09-24 (×3): 1300 mg via ORAL
  Filled 2016-09-23 (×3): qty 2

## 2016-09-23 MED ORDER — PROMETHAZINE HCL 25 MG RE SUPP: 12.5000 mg | Freq: Four times a day (QID) | RECTAL | Status: DC | PRN

## 2016-09-23 MED ORDER — SENNOSIDES-DOCUSATE SODIUM 8.6-50 MG PO TABS
1.0000 | ORAL_TABLET | Freq: Two times a day (BID) | ORAL | Status: DC
  Administered 2016-09-23 – 2016-10-13 (×28): 1 via ORAL
  Filled 2016-09-23 (×34): qty 1

## 2016-09-23 MED ORDER — FAMOTIDINE 20 MG PO TABS
20.0000 mg | ORAL_TABLET | Freq: Every day | ORAL | Status: DC
  Administered 2016-09-23 – 2016-10-14 (×22): 20 mg via ORAL
  Filled 2016-09-23 (×22): qty 1

## 2016-09-23 MED ORDER — SODIUM CHLORIDE 0.9 % IV BOLUS (SEPSIS)
500.0000 mL | Freq: Once | INTRAVENOUS | Status: AC
  Administered 2016-09-23: 500 mL via INTRAVENOUS

## 2016-09-23 MED ORDER — TAMSULOSIN HCL 0.4 MG PO CAPS
0.4000 mg | ORAL_CAPSULE | Freq: Every day | ORAL | Status: DC
  Administered 2016-09-23 – 2016-10-13 (×21): 0.4 mg via ORAL
  Filled 2016-09-23 (×21): qty 1

## 2016-09-23 MED ORDER — PROMETHAZINE HCL 25 MG PO TABS
12.5000 mg | ORAL_TABLET | Freq: Four times a day (QID) | ORAL | Status: DC | PRN
Start: 2016-09-23 — End: 2016-10-14
  Administered 2016-09-24 – 2016-09-29 (×3): 12.5 mg via ORAL
  Filled 2016-09-23 (×5): qty 1

## 2016-09-23 MED ORDER — POLYETHYLENE GLYCOL 3350 17 G PO PACK
17.0000 g | PACK | Freq: Every day | ORAL | Status: DC
  Filled 2016-09-23 (×15): qty 1

## 2016-09-23 NOTE — Progress Notes (Signed)
[facility] Progress Note  Patient: Rachael [name] Minor KLK:[id number]   PCP: No primary care provider on file. DOB: 01-29-1956   DOA: 09/22/2016   DOS: 09/23/2016   Date of Service: the patient was seen and examined on 09/23/2016  Brief hospital course: Pt. with PMH of CKD, HTN, hypothyroidism; admitted on 09/22/2016, with complaint of nausea and vomiting, was found to have AKI. Patient was [facility] from [facility] for nephrology opinion. Currently further plan is continue supportive management.  Assessment and Plan: 1. Acute renal failure superimposed on chronic kidney disease ([place])  Suspected UTI Postobstructive/post ATN diuresis.  Patient had chronic kidney disease stage III. Appears to be having worsening of her poor oral intake secondary to nausea and vomiting. Patient denies taking any ACE inhibitor's or any NSAIDs. Patient was on some antibiotics the name of which she does not know. Continues to urinate roughly on 4 L in 24 hour time period. FeNa based on lab work on 09/22/2016 19.6, suggesting post renal or obstructive etiology. Continue Foley catheter. Initiate the patient on Flomax. Attempt voiding trial on 09/24/2016. Continue IV fluids Appreciate nephrology input. SPEP hypoalbuminemia without any M spike. UA appears to be bland. Urine culture pending. Patient was started on IV ceftriaxone which I would continue present No evidence of volume overload, no evidence of uremia. No indication for HD at present.  2. Hypothyroidism. Continue Synthroid.  3. Recurrent nausea and vomiting. Constipation CT abdomen in other facility 09/19/2016 shows no acute abnormalities other than hiatal hernia. Ultrasound abdomen 09/21/2016 shows no evidence of obstruction, no hydronephrosis, bilateral renal size roughly 9 cm  Continue Zofran, add Phenergan. Patient is able to tolerate the diet. Starting the patient on MiraLAX and Senokot.  4. GERD. Changing PPI to  Pepcid  Bowel regimen: last BM prior to admission Diet: renal die DVT Prophylaxis: subcutaneous Heparin  Advance goals of care discussion: FULL CODE  Family Communication: no family was present at bedside, at the time of interview.   Disposition:  Discharge to HOME. Expected discharge date: 09/26/2016, stabilization of renal function  Consultants: nephrology  Procedures: none  Antibiotics: Anti-infectives    Start     Dose/Rate Route Frequency Ordered Stop   09/22/16 0300  cefTRIAXone (ROCEPHIN) 1 g in dextrose 5 % 50 mL IVPB     1 g 100 mL/hr over 30 Minutes Intravenous Every 24 hours 09/22/16 0226        Subjective: Feeling better, had an episode of vomiting yesterday as well as nausea overnight, currently resolved.  Objective: Physical Exam: Vitals:   09/22/16 1646 09/22/16 2300 09/23/16 0500 09/23/16 1046  BP: (!) 151/81 126/66 (!) 113/51 (!) 144/70  Pulse: 85 79 78 78  Resp: 17 18 20 19   Temp: 98.5 F (36.9 C) 98.7 F (37.1 C) 99.2 F (37.3 C) 97.9 F (36.6 C)  TempSrc: Oral Oral Oral Oral  SpO2: 99% 96% 96% 98%  Weight:  106 kg (233 lb 11 oz)    Height:        Intake/Output Summary (Last 24 hours) at 09/23/16 1446 Last data filed at 09/23/16 1100  Gross per 24 hour  Intake             2084 ml  Output             3325 ml  Net            -1241 ml   Filed Weights   09/22/16 0155 09/22/16 0300 09/22/16 2300  Weight: 106.7 kg (235 lb 3.7 oz) 106 kg (233 lb 11 oz) 106 kg (233 lb 11 oz)    General: Alert, Awake and Oriented to Time, Place and Person. Appear in mild distress, affect appropriate Eyes: PERRL, Conjunctiva normal ENT: Oral Mucosa clear moist. Neck: no JVD, no Abnormal Mass Or lumps Cardiovascular: S1 and S2 Present, no Murmur, Respiratory: Bilateral Air entry equal and Decreased, no use of accessory muscle, Clear to Auscultation, no Crackles, no wheezes Abdomen: Bowel Sound present, Soft and no tenderness Skin: no redness, no Rash, no  induration Extremities: no Pedal edema, no calf tenderness Neurologic: Grossly no focal neuro deficit. Bilaterally Equal motor strength  Data Reviewed: CBC:  Recent Labs Lab 09/22/16 0220 09/23/16 0827  WBC 6.2 5.5  HGB 10.0* 9.8*  HCT 31.7* 30.9*  MCV 80.3 79.8  PLT 167 191*   Basic Metabolic Panel:  Recent Labs Lab 09/22/16 0220 09/22/16 1703 09/23/16 0827  NA 144 141 144  K 3.7 4.0 4.0  CL 116* 117* 120*  CO2 16* 18* 16*  GLUCOSE 94 110* 94  BUN 32* 33* 31*  CREATININE 6.19* 6.06* 5.82*  CALCIUM 8.5* 8.1* 8.1*  PHOS  --   --  4.6    Liver Function Tests:  Recent Labs Lab 09/22/16 0220 09/23/16 0827  AST 12*  --   ALT 10*  --   ALKPHOS 54  --   BILITOT 0.4  --   PROT 6.0*  --   ALBUMIN 2.7* 2.5*   No results for input(s): LIPASE, AMYLASE in the last 168 hours. No results for input(s): AMMONIA in the last 168 hours. Coagulation Profile: No results for input(s): INR, PROTIME in the last 168 hours. Cardiac Enzymes: No results for input(s): CKTOTAL, CKMB, CKMBINDEX, TROPONINI in the last 168 hours. BNP (last 3 results) No results for input(s): PROBNP in the last 8760 hours.  CBG: No results for input(s): GLUCAP in the last 168 hours.  Studies: No results found.   Scheduled Meds: . atorvastatin  40 mg Oral Daily  . cefTRIAXone (ROCEPHIN)  IV  1 g Intravenous Q24H  . citalopram  20 mg Oral Daily  . clopidogrel  75 mg Oral Daily  . famotidine  20 mg Oral Daily  . heparin  5,000 Units Subcutaneous Q8H  . levothyroxine  100 mcg Oral QAC breakfast  . polyethylene glycol  17 g Oral Daily  . senna-docusate  1 tablet Oral BID  . sodium bicarbonate  1,300 mg Oral TID  . tamsulosin  0.4 mg Oral Daily   Continuous Infusions: . sodium chloride 125 mL/hr at 09/23/16 0321   PRN Meds: ondansetron (ZOFRAN) IV, promethazine **OR** promethazine **OR** promethazine  Time spent: 30 minutes  Author: Berle Mull, MD [facility] Pager:  [phone] 09/23/2016 2:46 PM  If 7PM-7AM, please contact night-coverage at [email or web address], password [facility]

## 2016-09-23 NOTE — Progress Notes (Signed)
[facility] Progress Note    Assessment/ Plan:    1.  Acute kidney injury superimposed on CKD III- likely due to prolonged PO intake.  Still urinating- non, oliguric and is improving.  Expect this to take several days to resolve.  No indications for HD at present.  UA with some glucose but isn't diabetic, elevated UP/C- ? If plasma cell dyscrasia is possible.  followup studies.  If abnormal may need to consider biopsy but no plans at present  2.  Anion gap metabolic acidosis: PO sodium bicarb 1300 TID  3.  Chronic pancreatitis: unclear if this is the precipitant of her n/v  4.  UTI- on CTX.    Subjective:    Still having some n/v, got phenergan this AM and is sleeping   Objective:   BP (!) 144/70 (BP Location: Left Arm)   Pulse 78   Temp 97.9 F (36.6 C) (Oral)   Resp 19   Ht 5\' 2"  (1.575 m)   Wt 106 kg (233 lb 11 oz)   SpO2 98%   BMI 42.74 kg/m   Intake/Output Summary (Last 24 hours) at 09/23/16 1205 Last data filed at 09/23/16 1100  Gross per 24 hour  Intake             2304 ml  Output             4225 ml  Net            -1921 ml   Weight change: -0.7 kg (-1 lb 8.7 oz)  Physical Exam: GEN: laying in bed, NAD HEENT: EOMI, PERRL, MMM NECK: supple PULM: CTAB CV RRR ABD mildly diffusely tender EXT; no LE edema NEURO AAO x 4  Imaging: Dg Chest 2 View  Result Date: 09/22/2016 CLINICAL DATA:  Nonproductive cough.  Shortness of breath EXAM: CHEST  2 VIEW COMPARISON:  CT 06/07/2016 . FINDINGS: Mediastinum and hilar structures normal. Cardiomegaly with normal pulmonary vascularity. Questionable density is noted over the anterior lung bases on lateral view. Follow-up PA lateral chest x-ray is suggested. This density persists CT can be obtained. IMPRESSION: 1. Questionable density is noted over the anterior aspect of the lower lung bases on lateral view. Follow-up chest x-ray suggested for further evaluation. This density persists nonenhanced chest CT is  suggested to further evaluate. 2. Cardiomegaly.  No pulmonary venous congestion. Electronically Signed   By: Marcello Moores  Register   On: 09/22/2016 12:09    Labs: BMET  Recent Labs Lab 09/22/16 0220 09/22/16 1703 09/23/16 0827  NA 144 141 144  K 3.7 4.0 4.0  CL 116* 117* 120*  CO2 16* 18* 16*  GLUCOSE 94 110* 94  BUN 32* 33* 31*  CREATININE 6.19* 6.06* 5.82*  CALCIUM 8.5* 8.1* 8.1*  PHOS  --   --  4.6   CBC  Recent Labs Lab 09/22/16 0220 09/23/16 0827  WBC 6.2 5.5  HGB 10.0* 9.8*  HCT 31.7* 30.9*  MCV 80.3 79.8  PLT 167 143*    Medications:    . atorvastatin  40 mg Oral Daily  . cefTRIAXone (ROCEPHIN)  IV  1 g Intravenous Q24H  . citalopram  20 mg Oral Daily  . clopidogrel  75 mg Oral Daily  . heparin  5,000 Units Subcutaneous Q8H  . levothyroxine  100 mcg Oral QAC breakfast  . pantoprazole  40 mg Oral Daily  . polyethylene glycol  17 g Oral Daily  . senna-docusate  1 tablet Oral BID  . sodium  bicarbonate  1,300 mg Oral BID  . tamsulosin  0.4 mg Oral Daily      Madelon Lips, MD 09/23/2016, 12:05 PM

## 2016-09-24 LAB — RENAL FUNCTION PANEL
ALBUMIN: 2.5 g/dL — AB (ref 3.5–5.0)
ANION GAP: 8 (ref 5–15)
BUN: 29 mg/dL — ABNORMAL HIGH (ref 6–20)
CALCIUM: 8.1 mg/dL — AB (ref 8.9–10.3)
CO2: 15 mmol/L — ABNORMAL LOW (ref 22–32)
Chloride: 120 mmol/L — ABNORMAL HIGH (ref 101–111)
Creatinine, Ser: 5.95 mg/dL — ABNORMAL HIGH (ref 0.44–1.00)
GFR calc Af Amer: 8 mL/min — ABNORMAL LOW (ref >60)
GFR calc non Af Amer: 7 mL/min — ABNORMAL LOW (ref >60)
Glucose, Bld: 97 mg/dL (ref 65–99)
PHOSPHORUS: 4.6 mg/dL (ref 2.5–4.6)
POTASSIUM: 3.8 mmol/L (ref 3.5–5.1)
Sodium: 143 mmol/L (ref 135–145)

## 2016-09-24 LAB — CBC
HEMATOCRIT: 30.9 % — AB (ref 36.0–46.0)
HEMOGLOBIN: 9.8 g/dL — AB (ref 12.0–15.0)
MCH: 25.1 pg — ABNORMAL LOW (ref 26.0–34.0)
MCHC: 31.7 g/dL (ref 30.0–36.0)
MCV: 79.2 fL (ref 78.0–100.0)
Platelets: 140 10*3/uL — ABNORMAL LOW (ref 150–400)
RBC: 3.9 MIL/uL (ref 3.87–5.11)
RDW: 17.1 % — AB (ref 11.5–15.5)
WBC: 5.5 10*3/uL (ref 4.0–10.5)

## 2016-09-24 LAB — KAPPA/LAMBDA LIGHT CHAINS
Kappa free light chain: 53.4 mg/L — ABNORMAL HIGH (ref 3.3–19.4)
Kappa, lambda light chain ratio: 1.49 (ref 0.26–1.65)
Lambda free light chains: 35.9 mg/L — ABNORMAL HIGH (ref 5.7–26.3)

## 2016-09-24 MED ORDER — METOCLOPRAMIDE HCL 5 MG PO TABS
5.0000 mg | ORAL_TABLET | Freq: Three times a day (TID) | ORAL | Status: DC
  Administered 2016-09-24 – 2016-10-03 (×24): 5 mg via ORAL
  Filled 2016-09-24 (×24): qty 1

## 2016-09-24 MED ORDER — SODIUM BICARBONATE 8.4 % IV SOLN
INTRAVENOUS | Status: AC
  Administered 2016-09-24 – 2016-09-25 (×3): via INTRAVENOUS
  Filled 2016-09-24 (×5): qty 150

## 2016-09-24 NOTE — Progress Notes (Signed)
[facility] Progress Note  Patient: Rachael [name] POE:[id number]   PCP: No primary care provider on file. DOB: [birth year]   DOA: 09/22/2016   DOS: 09/24/2016   Date of Service: the patient was seen and examined on 09/24/2016  Brief hospital course: Pt. with PMH of CKD, HTN, hypothyroidism; admitted on 09/22/2016, with complaint of nausea and vomiting, was found to have AKI. Patient was transferred to [facility] from [facility] for nephrology opinion. Currently further plan is continue supportive management.  Assessment and Plan: 1. Acute renal failure superimposed on chronic kidney disease ([place])  Suspected UTI Postobstructive/post ATN diuresis.  Patient had chronic kidney disease stage III. Appears to be having worsening of her poor oral intake secondary to nausea and vomiting. ACE inhibitor's or any NSAIDs. Patient was on some antibiotics--unclear which Continues to urinate roughly on 4 L in 24 hour time period. FeNa based on lab work on 09/22/2016 19.6, suggesting post renal or obstructive etiology. Continue Foley catheter-started the patient on Flomax. Attempt voiding trial on 09/24/2016. Continue IV fluidsWith bicarbonate Appreciate nephrology input. SPEP hypoalbuminemia without any M spike. UA appears to be bland. Urine culture 09/22/16 is negative so IV ceftriaxone was then discontinued No evidence of volume overload, no evidence of uremia. No indication for HD at present.  2. Hypothyroidism. Continue Synthroid.  3. Recurrent nausea and vomiting. Constipation CT abdomen in other facility 09/19/2016 shows no acute abnormalities other than hiatal hernia. Ultrasound abdomen 09/21/2016 shows no evidence of obstruction, no hydronephrosis, bilateral renal size roughly 9 cm We will add Reglan to medications  Continue Zofran, add Phenergan. Patient is able to tolerate the diet. Starting the patient on MiraLAX and Senokot.  4. GERD. Changing PPI to Pepcid  Bowel  regimen: last BM prior to admission Diet: renal die DVT Prophylaxis: subcutaneous Heparin  Advance goals of care discussion: FULL CODE  Family Communication: no family was present at bedside, at the time of interview.   Disposition:  Discharge to HOME. Expected discharge date: 09/26/2016, stabilization of renal function  Consultants: nephrology  Procedures: none  Antibiotics: Anti-infectives    Start     Dose/Rate Route Frequency Ordered Stop   09/22/16 0300  cefTRIAXone (ROCEPHIN) 1 g in dextrose 5 % 50 mL IVPB     1 g 100 mL/hr over 30 Minutes Intravenous Every 24 hours 09/22/16 0226        Subjective: Feeling better, had an episode of vomiting yesterday as well as nausea overnight, currently resolved.  Objective: Physical Exam: Vitals:   09/23/16 0500 09/23/16 1046 09/23/16 1745 09/24/16 1000  BP: (!) 113/51 (!) 144/70 (!) 155/74 (!) 146/62  Pulse: 78 78 81 72  Resp: 20 19 18 18   Temp: 99.2 F (37.3 C) 97.9 F (36.6 C) 98.4 F (36.9 C) 98.4 F (36.9 C)  TempSrc: Oral Oral Oral Oral  SpO2: 96% 98% 98% 98%  Weight:      Height:        Intake/Output Summary (Last 24 hours) at 09/24/16 1321 Last data filed at 09/24/16 0900  Gross per 24 hour  Intake             3460 ml  Output             1925 ml  Net             1535 ml   Filed Weights   09/22/16 0155 09/22/16 0300 09/22/16 2300  Weight: 106.7 kg (235 lb 3.7 oz) 106 kg (233 lb  11 oz) 106 kg (233 lb 11 oz)    General: Alert, Awake and Oriented to Time, Place and Person.  Eyes: PERRL, Conjunctiva normal ENT: Oral Mucosa clear moist. Neck: no JVD, no Abnormal Mass Or lumps Cardiovascular: S1 and S2 Present, no Murmur, Respiratory: Bilateral Air entry equal and Decreased, no use of accessory muscle, Clear to Auscultation Abdomen: Bowel Sound present, Soft and no tenderness Skin: no redness, no Rash, no induration Extremities: no Pedal edema, no calf tenderness Neurologic: Grossly no focal neuro deficit.  Bilaterally Equal motor strength  Data Reviewed: CBC:  Recent Labs Lab 09/22/16 0220 09/23/16 0827 09/24/16 0537  WBC 6.2 5.5 5.5  HGB 10.0* 9.8* 9.8*  HCT 31.7* 30.9* 30.9*  MCV 80.3 79.8 79.2  PLT 167 143* 080*   Basic Metabolic Panel:  Recent Labs Lab 09/22/16 0220 09/22/16 1703 09/23/16 0827 09/24/16 0537  NA 144 141 144 143  K 3.7 4.0 4.0 3.8  CL 116* 117* 120* 120*  CO2 16* 18* 16* 15*  GLUCOSE 94 110* 94 97  BUN 32* 33* 31* 29*  CREATININE 6.19* 6.06* 5.82* 5.95*  CALCIUM 8.5* 8.1* 8.1* 8.1*  PHOS  --   --  4.6 4.6    Liver Function Tests:  Recent Labs Lab 09/22/16 0220 09/23/16 0827 09/24/16 0537  AST 12*  --   --   ALT 10*  --   --   ALKPHOS 54  --   --   BILITOT 0.4  --   --   PROT 6.0*  --   --   ALBUMIN 2.7* 2.5* 2.5*   No results for input(s): LIPASE, AMYLASE in the last 168 hours. No results for input(s): AMMONIA in the last 168 hours. Coagulation Profile: No results for input(s): INR, PROTIME in the last 168 hours. Cardiac Enzymes: No results for input(s): CKTOTAL, CKMB, CKMBINDEX, TROPONINI in the last 168 hours. BNP (last 3 results) No results for input(s): PROBNP in the last 8760 hours.  CBG: No results for input(s): GLUCAP in the last 168 hours.  Studies: No results found.   Scheduled Meds: . atorvastatin  40 mg Oral Daily  . cefTRIAXone (ROCEPHIN)  IV  1 g Intravenous Q24H  . citalopram  20 mg Oral Daily  . clopidogrel  75 mg Oral Daily  . famotidine  20 mg Oral Daily  . heparin  5,000 Units Subcutaneous Q8H  . levothyroxine  100 mcg Oral QAC breakfast  . polyethylene glycol  17 g Oral Daily  . senna-docusate  1 tablet Oral BID  . tamsulosin  0.4 mg Oral Daily   Continuous Infusions: .  sodium bicarbonate  infusion 1000 mL     PRN Meds: ondansetron (ZOFRAN) IV, promethazine **OR** promethazine **OR** promethazine  Time spent: 30 minutes  Author: Berle Mull, MD [facility] Pager: [phone] 09/24/2016  1:21 PM  If 7PM-7AM, please contact night-coverage at [email or web address], password [facility]

## 2016-09-24 NOTE — Progress Notes (Signed)
  [facility] Progress Note    Assessment/ Plan:    1.  Acute kidney injury superimposed on CKD III- likely due to prolonged PO intake.  Still urinating- non, oliguric but creatinine has plateaued.  Glucose on UA but no h/o diabetes, adding HgbA1c, ANA, ANCA, SPEP unremarkable serum free light chains pending.  Considering biopsy if minimal improvement but on Plavix- will monitor for now as this can take several days to really get better in setting of prolonged poor PO intake.  2.  Mixed  Anion gap and nonanion gap metabolic acidosis: have switched IV normal saline to D5W with 3 amps bicarb; nonanion gap metabolic acidosis due to NS fluid resuscitation.    3.  Chronic pancreatitis: unclear if this is the precipitant of her n/v  4.  UTI- on CTX.    Subjective:    Still having some n/v, IVF stopped yesterday but creatinine has plateaued.     Objective:   BP (!) 146/62 (BP Location: Left Arm)   Pulse 72   Temp 98.4 F (36.9 C) (Oral)   Resp 18   Ht 5\' 2"  (1.575 m)   Wt 106 kg (233 lb 11 oz)   SpO2 98%   BMI 42.74 kg/m   Intake/Output Summary (Last 24 hours) at 09/24/16 1358 Last data filed at 09/24/16 0900  Gross per 24 hour  Intake             3460 ml  Output             1475 ml  Net             1985 ml   Weight change:   Physical Exam: GEN: laying in bed, NAD HEENT: EOMI, PERRL, MMM NECK: supple PULM: CTAB CV RRR ABD mildly diffusely tender EXT; no LE edema NEURO AAO x 4  Imaging: No results found.  Labs: BMET  Recent Labs Lab 09/22/16 0220 09/22/16 1703 09/23/16 0827 09/24/16 0537  NA 144 141 144 143  K 3.7 4.0 4.0 3.8  CL 116* 117* 120* 120*  CO2 16* 18* 16* 15*  GLUCOSE 94 110* 94 97  BUN 32* 33* 31* 29*  CREATININE 6.19* 6.06* 5.82* 5.95*  CALCIUM 8.5* 8.1* 8.1* 8.1*  PHOS  --   --  4.6 4.6   CBC  Recent Labs Lab 09/22/16 0220 09/23/16 0827 09/24/16 0537  WBC 6.2 5.5 5.5  HGB 10.0* 9.8* 9.8*  HCT 31.7* 30.9* 30.9*   MCV 80.3 79.8 79.2  PLT 167 143* 140*    Medications:    . atorvastatin  40 mg Oral Daily  . cefTRIAXone (ROCEPHIN)  IV  1 g Intravenous Q24H  . citalopram  20 mg Oral Daily  . clopidogrel  75 mg Oral Daily  . famotidine  20 mg Oral Daily  . heparin  5,000 Units Subcutaneous Q8H  . levothyroxine  100 mcg Oral QAC breakfast  . polyethylene glycol  17 g Oral Daily  . senna-docusate  1 tablet Oral BID  . tamsulosin  0.4 mg Oral Daily      Madelon Lips, MD 09/24/2016, 1:58 PM

## 2016-09-25 ENCOUNTER — Inpatient Hospital Stay (HOSPITAL_COMMUNITY): Payer: Medicare HMO

## 2016-09-25 LAB — CBC
HEMATOCRIT: 29.2 % — AB (ref 36.0–46.0)
HEMOGLOBIN: 9.5 g/dL — AB (ref 12.0–15.0)
MCH: 25.2 pg — AB (ref 26.0–34.0)
MCHC: 32.5 g/dL (ref 30.0–36.0)
MCV: 77.5 fL — ABNORMAL LOW (ref 78.0–100.0)
Platelets: 134 10*3/uL — ABNORMAL LOW (ref 150–400)
RBC: 3.77 MIL/uL — AB (ref 3.87–5.11)
RDW: 16.8 % — ABNORMAL HIGH (ref 11.5–15.5)
WBC: 5.3 10*3/uL (ref 4.0–10.5)

## 2016-09-25 LAB — RENAL FUNCTION PANEL
ANION GAP: 10 (ref 5–15)
Albumin: 2.5 g/dL — ABNORMAL LOW (ref 3.5–5.0)
BUN: 28 mg/dL — ABNORMAL HIGH (ref 6–20)
CALCIUM: 8.1 mg/dL — AB (ref 8.9–10.3)
CO2: 22 mmol/L (ref 22–32)
Chloride: 111 mmol/L (ref 101–111)
Creatinine, Ser: 5.67 mg/dL — ABNORMAL HIGH (ref 0.44–1.00)
GFR calc non Af Amer: 7 mL/min — ABNORMAL LOW (ref >60)
GFR, EST AFRICAN AMERICAN: 8 mL/min — AB (ref >60)
GLUCOSE: 96 mg/dL (ref 65–99)
PHOSPHORUS: 3.8 mg/dL (ref 2.5–4.6)
POTASSIUM: 3 mmol/L — AB (ref 3.5–5.1)
SODIUM: 143 mmol/L (ref 135–145)

## 2016-09-25 LAB — LIPASE, BLOOD: LIPASE: 32 U/L (ref 11–51)

## 2016-09-25 NOTE — Progress Notes (Signed)
[facility] Progress Note  Patient: Rachael [name] FGH:[id number]   PCP: No primary care provider on file. DOB: 10/26/1955   DOA: 09/22/2016   DOS: 09/25/2016   Date of Service: the patient was seen and examined on 09/25/2016  Brief hospital course: Pt. with PMH of CKD, HTN, hypothyroidism; admitted on 09/22/2016, with complaint of nausea and vomiting, was found to have AKI. Patient was transferred to [facility] from [facility] for nephrology opinion. Currently further plan is continue supportive management.  Assessment and Plan: 1. Acute renal failure superimposed on chronic kidney disease ([place])  Suspected UTI Postobstructive/post ATN diuresis.  Patient had chronic kidney disease stage III. Appears to be having worsening of her poor oral intake secondary to nausea and vomiting. ACE inhibitor's or any NSAIDs. Patient was on some antibiotics--unclear which Continues to urinate FeNa based on lab work on 09/22/2016 19.6, suggesting post renal or obstructive etiology. Continue Foley catheter-started the patient on Flomax. Attempt voiding trial on 09/24/2016. Continue IV fluids With bicarbonate Appreciate nephrology input. SPEP hypoalbuminemia without any M spike. UA appears to be bland. Urine culture 09/22/16 is negative so IV ceftriaxone was then discontinued Rest as per nephrologist-currently awaiting light chains-  2. Hypothyroidism. Continue Synthroid.  3. Recurrent nausea and vomiting. Constipation CT abdomen in other facility 09/19/2016 shows no acute abnormalities other than hiatal hernia. Ultrasound abdomen 09/21/2016 shows no evidence of obstruction, no hydronephrosis, bilateral renal size roughly 9 cm We will add Reglan to medications--this seemed to help to some extent This has been going on for a short period of time and is only made better by Phenergan Might need outpatient gastroenterology  Continue Zofran, add Phenergan. Patient is able to tolerate the  diet. Starting the patient on MiraLAX and Senokot.  4. GERD. Changing PPI to Pepcid  Bowel regimen: last BM prior to admission Diet: renal die DVT Prophylaxis: subcutaneous Heparin  Advance goals of care discussion: FULL CODE  Family Communication: no family was present at bedside, at the time of interview.   Disposition:  Discharge to HOME.  Start     Dose/Rate Route Frequency Ordered Stop   09/22/16 0300  cefTRIAXone (ROCEPHIN) 1 g in dextrose 5 % 50 mL IVPB  Status:  Discontinued     1 g 100 mL/hr over 30 Minutes Intravenous Every 24 hours 09/22/16 0226 09/24/16 1559      Subjective:  Feeling fair. Able to eat some extent. No nausea no vomiting. No chest pain Stool   Objective: Physical Exam: Vitals:   09/24/16 2010 09/24/16 2056 09/25/16 0505 09/25/16 1000  BP: (!) 161/70  125/60 (!) 158/70  Pulse: 74  75 81  Resp: 19  20 20   Temp: 98.6 F (37 C)  98.3 F (36.8 C) 98.7 F (37.1 C)  TempSrc: Oral  Oral Oral  SpO2: 100%  97% 98%  Weight:  105.5 kg (232 lb 8 oz)    Height:        Intake/Output Summary (Last 24 hours) at 09/25/16 1333 Last data filed at 09/25/16 0900  Gross per 24 hour  Intake          2285.83 ml  Output             2350 ml  Net           -64.17 ml   Filed Weights   09/22/16 0300 09/22/16 2300 09/24/16 2056  Weight: 106 kg (233 lb 11 oz) 106 kg (233 lb 11 oz) 105.5 kg (232 lb  8 oz)    General: Alert, Awake and Oriented to Time, Place and Person.  Eyes: PERRL, Conjunctiva normal ENT: Oral Mucosa clear moist. Neck: no JVD Cardiovascular: S1 and S2 Present, no Murmur Respiratory: Bilateral Air entry equal and Decreased, no use of accessory muscle, Clear to Auscultation Abdomen: Bowel Sound present, Soft and no tenderness, no rebound no guarding Skin: no redness, no Rash, no induration Extremities: no Pedal edema, no calf tenderness Neurologic: Grossly no focal neuro deficit.  Data Reviewed: CBC:  Recent Labs Lab 09/22/16 0220  09/23/16 0827 09/24/16 0537 09/25/16 0625  WBC 6.2 5.5 5.5 5.3  HGB 10.0* 9.8* 9.8* 9.5*  HCT 31.7* 30.9* 30.9* 29.2*  MCV 80.3 79.8 79.2 77.5*  PLT 167 143* 140* 379*   Basic Metabolic Panel:  Recent Labs Lab 09/22/16 0220 09/22/16 1703 09/23/16 0827 09/24/16 0537 09/25/16 0625  NA 144 141 144 143 143  K 3.7 4.0 4.0 3.8 3.0*  CL 116* 117* 120* 120* 111  CO2 16* 18* 16* 15* 22  GLUCOSE 94 110* 94 97 96  BUN 32* 33* 31* 29* 28*  CREATININE 6.19* 6.06* 5.82* 5.95* 5.67*  CALCIUM 8.5* 8.1* 8.1* 8.1* 8.1*  PHOS  --   --  4.6 4.6 3.8    Liver Function Tests:  Recent Labs Lab 09/22/16 0220 09/23/16 0827 09/24/16 0537 09/25/16 0625  AST 12*  --   --   --   ALT 10*  --   --   --   ALKPHOS 54  --   --   --   BILITOT 0.4  --   --   --   PROT 6.0*  --   --   --   ALBUMIN 2.7* 2.5* 2.5* 2.5*    Recent Labs Lab 09/25/16 0844  LIPASE 32   No results for input(s): AMMONIA in the last 168 hours. Coagulation Profile: No results for input(s): INR, PROTIME in the last 168 hours. Cardiac Enzymes: No results for input(s): CKTOTAL, CKMB, CKMBINDEX, TROPONINI in the last 168 hours. BNP (last 3 results) No results for input(s): PROBNP in the last 8760 hours.  CBG: No results for input(s): GLUCAP in the last 168 hours.  Studies: Dg Chest 2 View  Result Date: 09/25/2016 CLINICAL DATA:  Cough.  Hypertension. EXAM: CHEST  2 VIEW COMPARISON:  September 22, 2016 FINDINGS: There is no edema or consolidation. Heart size and pulmonary vascular normal. No adenopathy. There is aortic atherosclerosis. No bone lesions. IMPRESSION: No edema or consolidation. The ill-defined opacity overlying the heart anteriorly on the lateral view is no longer evident. Stable cardiac silhouette. There is aortic atherosclerosis. Electronically Signed   By: Lowella Grip III M.D.   On: 09/25/2016 07:32     Scheduled Meds: . atorvastatin  40 mg Oral Daily  . citalopram  20 mg Oral Daily  .  clopidogrel  75 mg Oral Daily  . famotidine  20 mg Oral Daily  . heparin  5,000 Units Subcutaneous Q8H  . levothyroxine  100 mcg Oral QAC breakfast  . metoCLOPramide  5 mg Oral TID AC  . polyethylene glycol  17 g Oral Daily  . senna-docusate  1 tablet Oral BID  . tamsulosin  0.4 mg Oral Daily   Continuous Infusions:  PRN Meds: ondansetron (ZOFRAN) IV, promethazine **OR** promethazine **OR** promethazine  Time spent: 15 minutes  Author: Berle Mull, MD [facility] Pager: [phone] 09/25/2016 1:33 PM  If 7PM-7AM, please contact night-coverage at [email or web address], password [facility]

## 2016-09-25 NOTE — Progress Notes (Signed)
[facility] Progress Note    Assessment/ Plan:    1.  Acute kidney injury superimposed on CKD III- likely due to prolonged poor PO intake.  Still urinating- non, oliguric. Glucose on UA but no h/o diabetes, adding HgbA1c, ANA, ANCA, SPEP unremarkable serum free light chains pending.  Considering biopsy if minimal improvement but on Plavix- will monitor for now as this can take several days to really get better in setting of prolonged poor PO intake.  2.  Mixed  Anion gap and nonanion gap metabolic acidosis: decrease rate of D5W with 3 amps bicarb to 75 mL/ hr   3.  Chronic pancreatitis: unclear if this is the precipitant of her n/v- still ongoing, consider GI c/s- ? Does she need EGD.  4.  UTI- s/p CTX.    Subjective:    Still having some n/v.     Objective:   BP (!) 158/70 (BP Location: Left Arm)   Pulse 81   Temp 98.7 F (37.1 C) (Oral)   Resp 20   Ht 5\' 2"  (1.575 m)   Wt 105.5 kg (232 lb 8 oz)   SpO2 98%   BMI 42.52 kg/m   Intake/Output Summary (Last 24 hours) at 09/25/16 1301 Last data filed at 09/25/16 0900  Gross per 24 hour  Intake          2285.83 ml  Output             2350 ml  Net           -64.17 ml   Weight change:   Physical Exam: GEN: laying in bed, NAD HEENT: EOMI, PERRL, MMM NECK: supple PULM: CTAB CV RRR ABD mildly diffusely tender EXT; no LE edema NEURO AAO x 4  Imaging: Dg Chest 2 View  Result Date: 09/25/2016 CLINICAL DATA:  Cough.  Hypertension. EXAM: CHEST  2 VIEW COMPARISON:  September 22, 2016 FINDINGS: There is no edema or consolidation. Heart size and pulmonary vascular normal. No adenopathy. There is aortic atherosclerosis. No bone lesions. IMPRESSION: No edema or consolidation. The ill-defined opacity overlying the heart anteriorly on the lateral view is no longer evident. Stable cardiac silhouette. There is aortic atherosclerosis. Electronically Signed   By: Lowella Grip III M.D.   On: 09/25/2016 07:32     Labs: BMET  Recent Labs Lab 09/22/16 0220 09/22/16 1703 09/23/16 0827 09/24/16 0537 09/25/16 0625  NA 144 141 144 143 143  K 3.7 4.0 4.0 3.8 3.0*  CL 116* 117* 120* 120* 111  CO2 16* 18* 16* 15* 22  GLUCOSE 94 110* 94 97 96  BUN 32* 33* 31* 29* 28*  CREATININE 6.19* 6.06* 5.82* 5.95* 5.67*  CALCIUM 8.5* 8.1* 8.1* 8.1* 8.1*  PHOS  --   --  4.6 4.6 3.8   CBC  Recent Labs Lab 09/22/16 0220 09/23/16 0827 09/24/16 0537 09/25/16 0625  WBC 6.2 5.5 5.5 5.3  HGB 10.0* 9.8* 9.8* 9.5*  HCT 31.7* 30.9* 30.9* 29.2*  MCV 80.3 79.8 79.2 77.5*  PLT 167 143* 140* 134*    Medications:    . atorvastatin  40 mg Oral Daily  . citalopram  20 mg Oral Daily  . clopidogrel  75 mg Oral Daily  . famotidine  20 mg Oral Daily  . heparin  5,000 Units Subcutaneous Q8H  . levothyroxine  100 mcg Oral QAC breakfast  . metoCLOPramide  5 mg Oral TID AC  . polyethylene glycol  17 g Oral Daily  . senna-docusate  1 tablet Oral BID  . tamsulosin  0.4 mg Oral Daily      Madelon Lips, MD 09/25/2016, 1:01 PM

## 2016-09-26 LAB — COMPREHENSIVE METABOLIC PANEL
ALT: 14 U/L (ref 14–54)
AST: 20 U/L (ref 15–41)
Albumin: 2.8 g/dL — ABNORMAL LOW (ref 3.5–5.0)
Alkaline Phosphatase: 51 U/L (ref 38–126)
Anion gap: 10 (ref 5–15)
BUN: 28 mg/dL — AB (ref 6–20)
CHLORIDE: 108 mmol/L (ref 101–111)
CO2: 25 mmol/L (ref 22–32)
CREATININE: 6.27 mg/dL — AB (ref 0.44–1.00)
Calcium: 8.6 mg/dL — ABNORMAL LOW (ref 8.9–10.3)
GFR calc Af Amer: 7 mL/min — ABNORMAL LOW (ref >60)
GFR, EST NON AFRICAN AMERICAN: 6 mL/min — AB (ref >60)
Glucose, Bld: 103 mg/dL — ABNORMAL HIGH (ref 65–99)
Potassium: 3.1 mmol/L — ABNORMAL LOW (ref 3.5–5.1)
SODIUM: 143 mmol/L (ref 135–145)
Total Bilirubin: 0.4 mg/dL (ref 0.3–1.2)
Total Protein: 6.2 g/dL — ABNORMAL LOW (ref 6.5–8.1)

## 2016-09-26 LAB — RENAL FUNCTION PANEL
ALBUMIN: 2.7 g/dL — AB (ref 3.5–5.0)
ANION GAP: 9 (ref 5–15)
BUN: 28 mg/dL — ABNORMAL HIGH (ref 6–20)
CALCIUM: 8.4 mg/dL — AB (ref 8.9–10.3)
CO2: 26 mmol/L (ref 22–32)
CREATININE: 6.07 mg/dL — AB (ref 0.44–1.00)
Chloride: 110 mmol/L (ref 101–111)
GFR calc non Af Amer: 7 mL/min — ABNORMAL LOW (ref >60)
GFR, EST AFRICAN AMERICAN: 8 mL/min — AB (ref >60)
Glucose, Bld: 95 mg/dL (ref 65–99)
Phosphorus: 4 mg/dL (ref 2.5–4.6)
Potassium: 3 mmol/L — ABNORMAL LOW (ref 3.5–5.1)
SODIUM: 145 mmol/L (ref 135–145)

## 2016-09-26 LAB — CBC
HCT: 31.3 % — ABNORMAL LOW (ref 36.0–46.0)
HEMOGLOBIN: 10 g/dL — AB (ref 12.0–15.0)
MCH: 24.9 pg — ABNORMAL LOW (ref 26.0–34.0)
MCHC: 31.9 g/dL (ref 30.0–36.0)
MCV: 77.9 fL — ABNORMAL LOW (ref 78.0–100.0)
PLATELETS: 147 10*3/uL — AB (ref 150–400)
RBC: 4.02 MIL/uL (ref 3.87–5.11)
RDW: 16.6 % — ABNORMAL HIGH (ref 11.5–15.5)
WBC: 6.1 10*3/uL (ref 4.0–10.5)

## 2016-09-26 LAB — HEMOGLOBIN A1C
Hgb A1c MFr Bld: 5.6 % (ref 4.8–5.6)
Mean Plasma Glucose: 114 mg/dL

## 2016-09-26 LAB — ANCA TITERS
Atypical P-ANCA titer: <1:20 {titer}
C-ANCA: <1:20 {titer}
P-ANCA: <1:20 {titer}

## 2016-09-26 LAB — ANTINUCLEAR ANTIBODIES, IFA: ANA Ab, IFA: NEGATIVE

## 2016-09-26 NOTE — Care Management Important Message (Signed)
Important Message  Patient Details  Name: Rachael [name] MRN: [id number] Date of Birth: [birth year]   Medicare Important Message Given:  Yes    Decorey Wahlert 09/26/2016, 1:32 PM

## 2016-09-26 NOTE — Progress Notes (Signed)
[facility] Progress Note  Patient: Rachael [name] URK:[id number]   PCP: No primary care provider on file. DOB: [birth year]   DOA: 09/22/2016   DOS: 09/25/2016   Date of Service: the patient was seen and examined on 09/25/2016  Brief hospital course: Pt. with PMH of CKD, HTN, hypothyroidism; admitted on 09/22/2016, with complaint of nausea and vomiting, was found to have AKI. Patient was transferred to [facility] from [facility] for nephrology opinion. Currently further plan is continue supportive management.  Assessment and Plan: 1. Acute renal failure superimposed on chronic kidney disease ([place])  Suspected UTI Postobstructive/post ATN diuresis.  Patient had chronic kidney disease stage III. Appears to be having worsening of her poor oral intake secondary to nausea and vomiting. ACE inhibitor's or any NSAIDs. Patient was on some antibiotics--unclear which Continues to urinate FeNa based on lab work on 09/22/2016 19.6, suggesting post renal or obstructive etiology. Continue Foley catheter-started the patient on Flomax. Attempt voiding trial on 09/24/2016. Appreciate nephrology input. SPEP hypoalbuminemia without any M spike. UA appears to be bland. Urine culture 09/22/16 is negative so IV ceftriaxone was then discontinued Rest as per nephrologist-currently awaiting light chains-complements ordered, HIV, Hepatitis work-up--note LFT's were wnl  Likely renal biopsy 1/22 and is off plavix  2. Hypothyroidism. Continue Synthroid.  3. Recurrent nausea and vomiting. Constipation CT abdomen in other facility 09/19/2016 shows no acute abnormalities other than hiatal hernia. Ultrasound abdomen 09/21/2016 shows no evidence of obstruction, no hydronephrosis, bilateral renal size roughly 9 cm We will add Reglan to medications--this seemed to help to some extent This has been going on for a short period of time and is only made better by Phenergan LFTs wnl eating better Hold  Gastroenterology  Continue Zofran, add Phenergan. Patient is able to tolerate the diet. Starting the patient on MiraLAX and Senokot.  4. GERD. Changing PPI to Pepcid  Bowel regimen: last BM prior to admission Diet: renal die DVT Prophylaxis: subcutaneous Heparin  Advance goals of care discussion: FULL CODE  Family Communication: no family was present at bedside, at the time of interview.   Disposition:  Discharge to HOME.  Start     Dose/Rate Route Frequency Ordered Stop   09/22/16 0300  cefTRIAXone (ROCEPHIN) 1 g in dextrose 5 % 50 mL IVPB  Status:  Discontinued     1 g 100 mL/hr over 30 Minutes Intravenous Every 24 hours 09/22/16 0226 09/24/16 1559      Subjective:  fAIR NO N/V/CP EATING BETTER    Objective: Physical Exam: Vitals:   09/24/16 2010 09/24/16 2056 09/25/16 0505 09/25/16 1000  BP: (!) 161/70  125/60 (!) 158/70  Pulse: 74  75 81  Resp: 19  20 20   Temp: 98.6 F (37 C)  98.3 F (36.8 C) 98.7 F (37.1 C)  TempSrc: Oral  Oral Oral  SpO2: 100%  97% 98%  Weight:  105.5 kg (232 lb 8 oz)    Height:        Intake/Output Summary (Last 24 hours) at 09/25/16 1333 Last data filed at 09/25/16 0900  Gross per 24 hour  Intake          2285.83 ml  Output             2350 ml  Net           -64.17 ml   Filed Weights   09/22/16 0300 09/22/16 2300 09/24/16 2056  Weight: 106 kg (233 lb 11 oz) 106 kg (233 lb 11 oz)  105.5 kg (232 lb 8 oz)    General: Alert, Awake and Oriented to Time, Place and Person.  Eyes: PERRL, Conjunctiva normal ENT: Oral Mucosa clear moist. Neck: no JVD Cardiovascular: S1 and S2 Present, no Murmur Respiratory: Bilateral Air entry equal and Decreased, no use of accessory muscle, Clear to Auscultation Abdomen: Bowel Sound present, Soft and no tenderness, no rebound no guarding Skin: no redness, no Rash, no induration Extremities: no Pedal edema, no calf tenderness Neurologic: Grossly no focal neuro deficit.  Data  Reviewed: CBC:  Recent Labs Lab 09/22/16 0220 09/23/16 0827 09/24/16 0537 09/25/16 0625  WBC 6.2 5.5 5.5 5.3  HGB 10.0* 9.8* 9.8* 9.5*  HCT 31.7* 30.9* 30.9* 29.2*  MCV 80.3 79.8 79.2 77.5*  PLT 167 143* 140* 034*   Basic Metabolic Panel:  Recent Labs Lab 09/22/16 0220 09/22/16 1703 09/23/16 0827 09/24/16 0537 09/25/16 0625  NA 144 141 144 143 143  K 3.7 4.0 4.0 3.8 3.0*  CL 116* 117* 120* 120* 111  CO2 16* 18* 16* 15* 22  GLUCOSE 94 110* 94 97 96  BUN 32* 33* 31* 29* 28*  CREATININE 6.19* 6.06* 5.82* 5.95* 5.67*  CALCIUM 8.5* 8.1* 8.1* 8.1* 8.1*  PHOS  --   --  4.6 4.6 3.8    Liver Function Tests:  Recent Labs Lab 09/22/16 0220 09/23/16 0827 09/24/16 0537 09/25/16 0625  AST 12*  --   --   --   ALT 10*  --   --   --   ALKPHOS 54  --   --   --   BILITOT 0.4  --   --   --   PROT 6.0*  --   --   --   ALBUMIN 2.7* 2.5* 2.5* 2.5*    Recent Labs Lab 09/25/16 0844  LIPASE 32   No results for input(s): AMMONIA in the last 168 hours. Coagulation Profile: No results for input(s): INR, PROTIME in the last 168 hours. Cardiac Enzymes: No results for input(s): CKTOTAL, CKMB, CKMBINDEX, TROPONINI in the last 168 hours. BNP (last 3 results) No results for input(s): PROBNP in the last 8760 hours.  CBG: No results for input(s): GLUCAP in the last 168 hours.  Studies: Dg Chest 2 View  Result Date: 09/25/2016 CLINICAL DATA:  Cough.  Hypertension. EXAM: CHEST  2 VIEW COMPARISON:  September 22, 2016 FINDINGS: There is no edema or consolidation. Heart size and pulmonary vascular normal. No adenopathy. There is aortic atherosclerosis. No bone lesions. IMPRESSION: No edema or consolidation. The ill-defined opacity overlying the heart anteriorly on the lateral view is no longer evident. Stable cardiac silhouette. There is aortic atherosclerosis. Electronically Signed   By: Lowella Grip III M.D.   On: 09/25/2016 07:32     Scheduled Meds: . atorvastatin  40 mg Oral  Daily  . citalopram  20 mg Oral Daily  . clopidogrel  75 mg Oral Daily  . famotidine  20 mg Oral Daily  . heparin  5,000 Units Subcutaneous Q8H  . levothyroxine  100 mcg Oral QAC breakfast  . metoCLOPramide  5 mg Oral TID AC  . polyethylene glycol  17 g Oral Daily  . senna-docusate  1 tablet Oral BID  . tamsulosin  0.4 mg Oral Daily   Continuous Infusions:  PRN Meds: ondansetron (ZOFRAN) IV, promethazine **OR** promethazine **OR** promethazine  Time spent: 15 minutes  Author: Berle Mull, MD [facility] Pager: [phone] 09/25/2016 1:33 PM  If 7PM-7AM, please contact night-coverage at [email or web address], password  [email or web address]

## 2016-09-26 NOTE — Progress Notes (Signed)
[facility] Progress Note    Assessment/ Plan:    1.  Acute kidney injury superimposed on CKD III- likely due to prolonged poor PO intake.  Still urinating- non, oliguric. Glucose on UA but no h/o diabetes, adding HgbA1c (unremarkable), ANA, ANCA pending; , SPEP unremarkable serum free light chains with ratio of 1.49.  Discussed with pt renal biopsy- agreement to proceed, stopped Plavix.  2.  Mixed  Anion gap and nonanion gap metabolic acidosis: resolving, off fluids  3.  Chronic pancreatitis/nv: unclear if this is the precipitant of her n/v- still ongoing, consider GI c/s- ? Does she need EGD.  I don't believe her symptoms are attributable to uremia at this time  4.  UTI- s/p CTX, urine culture negative   5.  H/o CVA: on Plavix, have stopped this in setting of plans for biopsy (off 1/19).  Subjective:    Fluids stopped, cr really leveling out at 6ish.     Objective:   BP (!) 150/88 (BP Location: Right Arm)   Pulse 81   Temp 98.5 F (36.9 C)   Resp 20   Ht 5\' 2"  (1.575 m)   Wt 105.3 kg (232 lb 2.3 oz)   SpO2 96%   BMI 42.46 kg/m   Intake/Output Summary (Last 24 hours) at 09/26/16 1332 Last data filed at 09/26/16 0900  Gross per 24 hour  Intake              780 ml  Output             3150 ml  Net            -2370 ml   Weight change: -0.161 kg (-5.7 oz)  Physical Exam: GEN: laying in bed, NAD HEENT: EOMI, PERRL, MMM NECK: supple PULM: CTAB CV RRR ABD mildly diffusely tender EXT; no LE edema NEURO AAO x 4  Imaging: Dg Chest 2 View  Result Date: 09/25/2016 CLINICAL DATA:  Cough.  Hypertension. EXAM: CHEST  2 VIEW COMPARISON:  September 22, 2016 FINDINGS: There is no edema or consolidation. Heart size and pulmonary vascular normal. No adenopathy. There is aortic atherosclerosis. No bone lesions. IMPRESSION: No edema or consolidation. The ill-defined opacity overlying the heart anteriorly on the lateral view is no longer evident. Stable cardiac  silhouette. There is aortic atherosclerosis. Electronically Signed   By: Lowella Grip III M.D.   On: 09/25/2016 07:32    Labs: BMET  Recent Labs Lab 09/22/16 0220 09/22/16 1703 09/23/16 0827 09/24/16 0537 09/25/16 0625 09/26/16 0748  NA 144 141 144 143 143 145  K 3.7 4.0 4.0 3.8 3.0* 3.0*  CL 116* 117* 120* 120* 111 110  CO2 16* 18* 16* 15* 22 26  GLUCOSE 94 110* 94 97 96 95  BUN 32* 33* 31* 29* 28* 28*  CREATININE 6.19* 6.06* 5.82* 5.95* 5.67* 6.07*  CALCIUM 8.5* 8.1* 8.1* 8.1* 8.1* 8.4*  PHOS  --   --  4.6 4.6 3.8 4.0   CBC  Recent Labs Lab 09/23/16 0827 09/24/16 0537 09/25/16 0625 09/26/16 0748  WBC 5.5 5.5 5.3 6.1  HGB 9.8* 9.8* 9.5* 10.0*  HCT 30.9* 30.9* 29.2* 31.3*  MCV 79.8 79.2 77.5* 77.9*  PLT 143* 140* 134* 147*    Medications:    . citalopram  20 mg Oral Daily  . clopidogrel  75 mg Oral Daily  . famotidine  20 mg Oral Daily  . heparin  5,000 Units Subcutaneous Q8H  . levothyroxine  100 mcg  Oral QAC breakfast  . metoCLOPramide  5 mg Oral TID AC  . polyethylene glycol  17 g Oral Daily  . senna-docusate  1 tablet Oral BID  . tamsulosin  0.4 mg Oral Daily      Madelon Lips, MD 09/26/2016, 1:32 PM

## 2016-09-27 LAB — RENAL FUNCTION PANEL
Albumin: 2.8 g/dL — ABNORMAL LOW (ref 3.5–5.0)
Anion gap: 11 (ref 5–15)
BUN: 31 mg/dL — ABNORMAL HIGH (ref 6–20)
CHLORIDE: 109 mmol/L (ref 101–111)
CO2: 22 mmol/L (ref 22–32)
CREATININE: 6.52 mg/dL — AB (ref 0.44–1.00)
Calcium: 8.9 mg/dL (ref 8.9–10.3)
GFR, EST AFRICAN AMERICAN: 7 mL/min — AB (ref >60)
GFR, EST NON AFRICAN AMERICAN: 6 mL/min — AB (ref >60)
Glucose, Bld: 109 mg/dL — ABNORMAL HIGH (ref 65–99)
POTASSIUM: 3.3 mmol/L — AB (ref 3.5–5.1)
Phosphorus: 3.9 mg/dL (ref 2.5–4.6)
Sodium: 142 mmol/L (ref 135–145)

## 2016-09-27 LAB — CBC
HCT: 32.8 % — ABNORMAL LOW (ref 36.0–46.0)
Hemoglobin: 10.3 g/dL — ABNORMAL LOW (ref 12.0–15.0)
MCH: 24.8 pg — ABNORMAL LOW (ref 26.0–34.0)
MCHC: 31.4 g/dL (ref 30.0–36.0)
MCV: 79 fL (ref 78.0–100.0)
PLATELETS: 152 10*3/uL (ref 150–400)
RBC: 4.15 MIL/uL (ref 3.87–5.11)
RDW: 16.6 % — ABNORMAL HIGH (ref 11.5–15.5)
WBC: 6.4 10*3/uL (ref 4.0–10.5)

## 2016-09-27 LAB — HIV ANTIBODY (ROUTINE TESTING W REFLEX): HIV Screen 4th Generation wRfx: NONREACTIVE

## 2016-09-27 LAB — RPR: RPR Ser Ql: NONREACTIVE

## 2016-09-27 NOTE — Progress Notes (Signed)
[facility] Progress Note  Patient: Rachael [name] FMB:[id number]   PCP: No primary care provider on file. DOB: 1956-09-05   DOA: 09/22/2016   DOS: 09/25/2016   Date of Service: the patient was seen and examined on 09/25/2016  Brief hospital course: Pt. with PMH of CKD, HTN, hypothyroidism; admitted on 09/22/2016, with complaint of nausea and vomiting, was found to have AKI. Patient was transferred to [facility] from [facility] for nephrology opinion. Currently further plan is continue supportive management.  Assessment and Plan: 1. Acute renal failure superimposed on chronic kidney disease ([place])  Suspected UTI Postobstructive/post ATN diuresis.  Patient had chronic kidney disease stage III. Appears to be having worsening of her poor oral intake secondary to nausea and vomiting. ACE inhibitor's or any NSAIDs. Patient was on some antibiotics--unclear which FeNa based on lab work on 09/22/2016 19.6=post renal or obstructive etiology. Continue Foley catheter-started the patient on Flomax. SPEP hypoalbuminemia without any M spike. Plan lambda light chains elevated At 53.4, lambda 35.9 HIV hepatitis complement RPR still pending, Anca ANA within normal limits UA appears to be bland. Urine culture 09/22/16 is negative so IV ceftriaxone was then discontinued note LFT's were wnl  Likely renal biopsy 1/25 and is off plavix as of 1/20 Patient is developing acidosis and may benefit from bicarbonate  2. Hypothyroidism. Continue Synthroid.  3. Recurrent nausea and vomiting. Constipation CT abdomen in other facility 09/19/2016 shows no acute abnormalities other than hiatal hernia. Ultrasound abdomen 09/21/2016 shows no evidence of obstruction, no hydronephrosis, bilateral renal size roughly 9 cm added Reglan to medications--this seemed to help to some extent Seems to have resolved  Continue Zofran, add Phenergan. Patient is able to tolerate the diet. Starting the patient on MiraLAX and  Senokot.  4. GERD. Changing PPI to Pepcid  Bowel regimen: last BM prior to admission Diet: renal die DVT Prophylaxis: subcutaneous Heparin  Advance goals of care discussion: FULL CODE  Family Communication: no family was present at bedside, at the time of interview.   Disposition:  Discharge to HOME.  Start     Dose/Rate Route Frequency Ordered Stop   09/22/16 0300  cefTRIAXone (ROCEPHIN) 1 g in dextrose 5 % 50 mL IVPB  Status:  Discontinued     1 g 100 mL/hr over 30 Minutes Intravenous Every 24 hours 09/22/16 0226 09/24/16 1559      Subjective:   BP this morning in no apparent distress Still passing reasonable amounts of urine   Objective: Physical Exam: Vitals:   09/24/16 2010 09/24/16 2056 09/25/16 0505 09/25/16 1000  BP: (!) 161/70  125/60 (!) 158/70  Pulse: 74  75 81  Resp: 19  20 20   Temp: 98.6 F (37 C)  98.3 F (36.8 C) 98.7 F (37.1 C)  TempSrc: Oral  Oral Oral  SpO2: 100%  97% 98%  Weight:  105.5 kg (232 lb 8 oz)    Height:        Intake/Output Summary (Last 24 hours) at 09/25/16 1333 Last data filed at 09/25/16 0900  Gross per 24 hour  Intake          2285.83 ml  Output             2350 ml  Net           -64.17 ml   Filed Weights   09/22/16 0300 09/22/16 2300 09/24/16 2056  Weight: 106 kg (233 lb 11 oz) 106 kg (233 lb 11 oz) 105.5 kg (232 lb 8  oz)    General: Alert, Awake and Oriented x3 Eyes: PERRL, Conjunctiva normal ENT: Oral Mucosa clear moist. Neck: no JVD Cardiovascular: S1 and S2 Present, no Murmur Respiratory: Bilateral Air entry equal-Clear to Auscultation Abdomen: Bowel Sound present, Soft and no tenderness, no rebound no guarding Skin: no redness, no Rash, no induration Extremities: no Pedal edema, no calf tenderness Neurologic: Grossly no focal neuro deficit.  Data Reviewed: CBC:  Recent Labs Lab 09/22/16 0220 09/23/16 0827 09/24/16 0537 09/25/16 0625  WBC 6.2 5.5 5.5 5.3  HGB 10.0* 9.8* 9.8* 9.5*  HCT 31.7* 30.9*  30.9* 29.2*  MCV 80.3 79.8 79.2 77.5*  PLT 167 143* 140* 778*   Basic Metabolic Panel:  Recent Labs Lab 09/22/16 0220 09/22/16 1703 09/23/16 0827 09/24/16 0537 09/25/16 0625  NA 144 141 144 143 143  K 3.7 4.0 4.0 3.8 3.0*  CL 116* 117* 120* 120* 111  CO2 16* 18* 16* 15* 22  GLUCOSE 94 110* 94 97 96  BUN 32* 33* 31* 29* 28*  CREATININE 6.19* 6.06* 5.82* 5.95* 5.67*  CALCIUM 8.5* 8.1* 8.1* 8.1* 8.1*  PHOS  --   --  4.6 4.6 3.8    Liver Function Tests:  Recent Labs Lab 09/22/16 0220 09/23/16 0827 09/24/16 0537 09/25/16 0625  AST 12*  --   --   --   ALT 10*  --   --   --   ALKPHOS 54  --   --   --   BILITOT 0.4  --   --   --   PROT 6.0*  --   --   --   ALBUMIN 2.7* 2.5* 2.5* 2.5*    Recent Labs Lab 09/25/16 0844  LIPASE 32   No results for input(s): AMMONIA in the last 168 hours. Coagulation Profile: No results for input(s): INR, PROTIME in the last 168 hours. Cardiac Enzymes: No results for input(s): CKTOTAL, CKMB, CKMBINDEX, TROPONINI in the last 168 hours. BNP (last 3 results) No results for input(s): PROBNP in the last 8760 hours.  CBG: No results for input(s): GLUCAP in the last 168 hours.  Studies: Dg Chest 2 View  Result Date: 09/25/2016 CLINICAL DATA:  Cough.  Hypertension. EXAM: CHEST  2 VIEW COMPARISON:  September 22, 2016 FINDINGS: There is no edema or consolidation. Heart size and pulmonary vascular normal. No adenopathy. There is aortic atherosclerosis. No bone lesions. IMPRESSION: No edema or consolidation. The ill-defined opacity overlying the heart anteriorly on the lateral view is no longer evident. Stable cardiac silhouette. There is aortic atherosclerosis. Electronically Signed   By: Lowella Grip III M.D.   On: 09/25/2016 07:32     Scheduled Meds: . atorvastatin  40 mg Oral Daily  . citalopram  20 mg Oral Daily  . clopidogrel  75 mg Oral Daily  . famotidine  20 mg Oral Daily  . heparin  5,000 Units Subcutaneous Q8H  . levothyroxine   100 mcg Oral QAC breakfast  . metoCLOPramide  5 mg Oral TID AC  . polyethylene glycol  17 g Oral Daily  . senna-docusate  1 tablet Oral BID  . tamsulosin  0.4 mg Oral Daily   Continuous Infusions:  PRN Meds: ondansetron (ZOFRAN) IV, promethazine **OR** promethazine **OR** promethazine  Time spent: 15 minutes  Author: Verneita Griffes, MD [facility] (P) [phone]  If 7PM-7AM, please contact night-coverage at [email or web address], password [email or web address]

## 2016-09-27 NOTE — Progress Notes (Signed)
  [facility] Progress Note    Assessment/ Plan:    1.  Acute kidney injury superimposed on CKD III- likely due to prolonged poor PO intake but does not improve off fluids.  Still urinating- non, oliguric. Glucose on UA but no h/o diabetes, adding HgbA1c (unremarkable), ANA, ANCA pending; complements, HIV, hepatitis pending, SPEP unremarkable serum free light chains with ratio of 1.49.  Discussed with pt renal biopsy- agreement to proceed, stopped Plavix.  2.  Mixed  Anion gap and nonanion gap metabolic acidosis: resolving, off fluids  3.  Chronic pancreatitis/nv: unclear if this is the precipitant of her n/v- still ongoing, better with reglan, consider uremia as well, unclear picture  4.  UTI- s/p CTX, urine culture negative   5.  H/o CVA: on Plavix, have stopped this in setting of plans for biopsy (off 1/19).  Subjective:    Eating a little more today but still not taking in a lot of Po.     Objective:   BP (!) 150/55 (BP Location: Right Arm)   Pulse 80   Temp 98.5 F (36.9 C) (Oral)   Resp 18   Ht 5\' 2"  (1.575 m)   Wt 105.3 kg (232 lb 2.3 oz)   SpO2 95%   BMI 42.46 kg/m   Intake/Output Summary (Last 24 hours) at 09/27/16 1344 Last data filed at 09/27/16 1011  Gross per 24 hour  Intake              720 ml  Output             3380 ml  Net            -2660 ml   Weight change:   Physical Exam: GEN: laying in bed, NAD HEENT: EOMI, PERRL, MMM NECK: supple PULM: CTAB CV RRR ABD mildly diffusely tender EXT; no LE edema NEURO AAO x 4  Imaging: No results found.  Labs: BMET  Recent Labs Lab 09/22/16 1703 09/23/16 0827 09/24/16 0537 09/25/16 0625 09/26/16 0748 09/26/16 1306 09/27/16 0512  NA 141 144 143 143 145 143 142  K 4.0 4.0 3.8 3.0* 3.0* 3.1* 3.3*  CL 117* 120* 120* 111 110 108 109  CO2 18* 16* 15* 22 26 25 22   GLUCOSE 110* 94 97 96 95 103* 109*  BUN 33* 31* 29* 28* 28* 28* 31*  CREATININE 6.06* 5.82* 5.95* 5.67* 6.07* 6.27*  6.52*  CALCIUM 8.1* 8.1* 8.1* 8.1* 8.4* 8.6* 8.9  PHOS  --  4.6 4.6 3.8 4.0  --  3.9   CBC  Recent Labs Lab 09/24/16 0537 09/25/16 0625 09/26/16 0748 09/27/16 0512  WBC 5.5 5.3 6.1 6.4  HGB 9.8* 9.5* 10.0* 10.3*  HCT 30.9* 29.2* 31.3* 32.8*  MCV 79.2 77.5* 77.9* 79.0  PLT 140* 134* 147* 152    Medications:    . citalopram  20 mg Oral Daily  . famotidine  20 mg Oral Daily  . heparin  5,000 Units Subcutaneous Q8H  . levothyroxine  100 mcg Oral QAC breakfast  . metoCLOPramide  5 mg Oral TID AC  . polyethylene glycol  17 g Oral Daily  . senna-docusate  1 tablet Oral BID  . tamsulosin  0.4 mg Oral Daily      Madelon Lips, MD 09/27/2016, 1:44 PM

## 2016-09-27 NOTE — Progress Notes (Signed)
Order seen for random renal biopsy.  Patient's last dose of Plavix was today at 9:30am  Can safely proceed with biopsy on 10/02/2016  If patient clinically improves and is ready for discharge prior to the 25th. this can be scheduled as outpatient.  Murrell Redden PA-C 09/27/2016 12:37 PM

## 2016-09-28 LAB — RENAL FUNCTION PANEL
ALBUMIN: 2.9 g/dL — AB (ref 3.5–5.0)
Anion gap: 9 (ref 5–15)
BUN: 38 mg/dL — AB (ref 6–20)
CO2: 21 mmol/L — AB (ref 22–32)
Calcium: 9 mg/dL (ref 8.9–10.3)
Chloride: 110 mmol/L (ref 101–111)
Creatinine, Ser: 6.92 mg/dL — ABNORMAL HIGH (ref 0.44–1.00)
GFR calc Af Amer: 7 mL/min — ABNORMAL LOW (ref >60)
GFR calc non Af Amer: 6 mL/min — ABNORMAL LOW (ref >60)
GLUCOSE: 90 mg/dL (ref 65–99)
PHOSPHORUS: 4.3 mg/dL (ref 2.5–4.6)
POTASSIUM: 3.6 mmol/L (ref 3.5–5.1)
Sodium: 140 mmol/L (ref 135–145)

## 2016-09-28 LAB — HCV COMMENT:

## 2016-09-28 LAB — HEPATITIS B SURFACE ANTIGEN: HEP B S AG: NEGATIVE

## 2016-09-28 LAB — C4 COMPLEMENT: Complement C4, Body Fluid: 68 mg/dL — ABNORMAL HIGH (ref 14–44)

## 2016-09-28 LAB — HEPATITIS B SURFACE ANTIBODY,QUALITATIVE: Hep B S Ab: NONREACTIVE

## 2016-09-28 LAB — C3 COMPLEMENT: C3 COMPLEMENT: 157 mg/dL (ref 82–167)

## 2016-09-28 LAB — HEPATITIS C ANTIBODY (REFLEX)

## 2016-09-28 MED ORDER — ALBUTEROL SULFATE (2.5 MG/3ML) 0.083% IN NEBU
3.0000 mL | INHALATION_SOLUTION | RESPIRATORY_TRACT | Status: DC
  Filled 2016-09-28: qty 3

## 2016-09-28 NOTE — Progress Notes (Signed)
  [facility] Progress Note    Assessment/ Plan:    1.  Acute kidney injury superimposed on CKD III- likely due to prolonged poor PO intake but does not improve off fluids.  Still urinating- non, oliguric. Glucose on UA but no h/o diabetes, adding HgbA1c (unremarkable), ANA, ANCA pending; complements, HIV, hepatitis pending, SPEP unremarkable serum free light chains with ratio of 1.49.  Discussed with pt renal biopsy- agreement to proceed, stopped Plavix- safe to proceed with renal biopsy on 1/25 per IR .  If creatinine plateaus I suppose we could send her home and have her come back as an outpatient; regardless she will need close nephrology followup.  Hopefully wont need dialysis.  2.  Mixed  Anion gap and nonanion gap metabolic acidosis: resolving, off fluids  3.  Chronic pancreatitis/nv: unclear if this is the precipitant of her n/v- still ongoing, better with reglan, consider uremia as well, unclear picture  4.  UTI- s/p CTX, urine culture negative   5.  H/o CVA: on Plavix, have stopped this in setting of plans for biopsy (off 1/19).  Subjective:    Nausea much improved today.      Objective:   BP (!) 128/59 (BP Location: Left Arm)   Pulse 83   Temp 98 F (36.7 C) (Oral)   Resp 16   Ht 5\' 2"  (1.575 m)   Wt 105.3 kg (232 lb 2.3 oz)   SpO2 95%   BMI 42.46 kg/m   Intake/Output Summary (Last 24 hours) at 09/28/16 1336 Last data filed at 09/28/16 0930  Gross per 24 hour  Intake              360 ml  Output             2050 ml  Net            -1690 ml   Weight change:   Physical Exam: GEN: laying in bed, NAD HEENT: EOMI, PERRL, MMM NECK: supple PULM: CTAB CV RRR ABD mildly diffusely tender EXT; no LE edema NEURO AAO x 4  Imaging: No results found.  Labs: BMET  Recent Labs Lab 09/23/16 0827 09/24/16 0537 09/25/16 0625 09/26/16 0748 09/26/16 1306 09/27/16 0512 09/28/16 0359  NA 144 143 143 145 143 142 140  K 4.0 3.8 3.0* 3.0* 3.1* 3.3*  3.6  CL 120* 120* 111 110 108 109 110  CO2 16* 15* 22 26 25 22  21*  GLUCOSE 94 97 96 95 103* 109* 90  BUN 31* 29* 28* 28* 28* 31* 38*  CREATININE 5.82* 5.95* 5.67* 6.07* 6.27* 6.52* 6.92*  CALCIUM 8.1* 8.1* 8.1* 8.4* 8.6* 8.9 9.0  PHOS 4.6 4.6 3.8 4.0  --  3.9 4.3   CBC  Recent Labs Lab 09/24/16 0537 09/25/16 0625 09/26/16 0748 09/27/16 0512  WBC 5.5 5.3 6.1 6.4  HGB 9.8* 9.5* 10.0* 10.3*  HCT 30.9* 29.2* 31.3* 32.8*  MCV 79.2 77.5* 77.9* 79.0  PLT 140* 134* 147* 152    Medications:    . citalopram  20 mg Oral Daily  . famotidine  20 mg Oral Daily  . heparin  5,000 Units Subcutaneous Q8H  . levothyroxine  100 mcg Oral QAC breakfast  . metoCLOPramide  5 mg Oral TID AC  . polyethylene glycol  17 g Oral Daily  . senna-docusate  1 tablet Oral BID  . tamsulosin  0.4 mg Oral Daily      Madelon Lips, MD 09/28/2016, 1:36 PM

## 2016-09-28 NOTE — Progress Notes (Signed)
[facility] Progress Note  Patient: Rachael [name] ZOX:[id number]   PCP: No primary care provider on file. DOB: 1956/09/05   DOA: 09/22/2016   DOS: 09/25/2016   Date of Service: the patient was seen and examined on 09/25/2016  Brief hospital course: Pt. with PMH of CKD, HTN, hypothyroidism; admitted on 09/22/2016, with complaint of nausea and vomiting, was found to have AKI. Patient was transferred to [facility] from [facility] for nephrology opinion. awiaiting resolution of kindey function Tentatively having renal biopsy 1/25 after plavix washout  Assessment and Plan: 1. Acute renal failure superimposed on chronic kidney disease ([place])  Suspected UTI Postobstructive/post ATN diuresis.  Patient had chronic kidney disease stage III. Appears to be having worsening of her poor oral intake secondary to nausea and vomiting. ACE inhibitor's or any NSAIDs. Patient was on some antibiotics--unclear which FeNa based on lab work on 09/22/2016 19.6=post renal or obstructive etiology. Continue Foley catheter-started the patient on Flomax. SPEP hypoalbuminemia without any M spike. Plasma lambda light chains elevated At 53.4, lambda 35.9 however ratio not elevated  complement still pending, Anca ANA HIV hepatitis RPR within normal limits UA appears to be bland. Urine culture 09/22/16 is negative so IV ceftriaxone was then discontinued note LFT's were wnl  Likely renal biopsy 1/25 and is off plavix as of 1/20 Expect if doesn't;t improve will probably require dialysis however no clear indication of this currently acutely needed  2. Hypothyroidism. Continue Synthroid 100 mcg daily  3. Recurrent nausea and vomiting. Constipation CT abdomen in other facility 09/19/2016 shows no acute abnormalities other than hiatal hernia. Ultrasound abdomen 09/21/2016 shows no evidence of obstruction, no hydronephrosis, bilateral renal size roughly 9 cm added Reglan to medications--this seemed to help to some  extent Seems to have resolved  Continue Zofran, add Phenergan. Patient is able to tolerate the diet. Starting the patient on MiraLAX and Senokot.  4. GERD. Changing PPI to Pepcid  Bowel regimen: last BM prior to admission Diet: renal die DVT Prophylaxis: subcutaneous Heparin  Advance goals of care discussion: FULL CODE  Family Communication: no family was present at bedside, at the time of interview.   Disposition:  Discharge to HOME.  Start     Dose/Rate Route Frequency Ordered Stop   09/22/16 0300  cefTRIAXone (ROCEPHIN) 1 g in dextrose 5 % 50 mL IVPB  Status:  Discontinued     1 g 100 mL/hr over 30 Minutes Intravenous Every 24 hours 09/22/16 0226 09/24/16 1559      Subjective:   Fair No new issues Sleepy but rousable  No cp No difficulty with diet Still with foley  Objective: Physical Exam: Vitals:   09/24/16 2010 09/24/16 2056 09/25/16 0505 09/25/16 1000  BP: (!) 161/70  125/60 (!) 158/70  Pulse: 74  75 81  Resp: 19  20 20   Temp: 98.6 F (37 C)  98.3 F (36.8 C) 98.7 F (37.1 C)  TempSrc: Oral  Oral Oral  SpO2: 100%  97% 98%  Weight:  105.5 kg (232 lb 8 oz)    Height:        Intake/Output Summary (Last 24 hours) at 09/25/16 1333 Last data filed at 09/25/16 0900  Gross per 24 hour  Intake          2285.83 ml  Output             2350 ml  Net           -64.17 ml   [employer]  09/22/16 0300 09/22/16 2300 09/24/16 2056  Weight: 106 kg (233 lb 11 oz) 106 kg (233 lb 11 oz) 105.5 kg (232 lb 8 oz)    General: Alert, Awake and Oriented x3 Eyes: PERRL, Conjunctiva normal, no ict ENT: Oral Mucosa clear moist. Neck: no JVD Cardiovascular: S1 and S2 Present, no Murmur Respiratory: Bilateral Air entry equal-Clear to Auscultation Abdomen: Bowel Sound present, Soft and no tenderness, no rebound no guarding Skin: no redness, no Rash, no induration Extremities: no Pedal edema, no calf tenderness Neurologic: Grossly no focal neuro deficit.  Data  Reviewed: CBC:  Recent Labs Lab 09/22/16 0220 09/23/16 0827 09/24/16 0537 09/25/16 0625  WBC 6.2 5.5 5.5 5.3  HGB 10.0* 9.8* 9.8* 9.5*  HCT 31.7* 30.9* 30.9* 29.2*  MCV 80.3 79.8 79.2 77.5*  PLT 167 143* 140* 672*   Basic Metabolic Panel:  Recent Labs Lab 09/22/16 0220 09/22/16 1703 09/23/16 0827 09/24/16 0537 09/25/16 0625  NA 144 141 144 143 143  K 3.7 4.0 4.0 3.8 3.0*  CL 116* 117* 120* 120* 111  CO2 16* 18* 16* 15* 22  GLUCOSE 94 110* 94 97 96  BUN 32* 33* 31* 29* 28*  CREATININE 6.19* 6.06* 5.82* 5.95* 5.67*  CALCIUM 8.5* 8.1* 8.1* 8.1* 8.1*  PHOS  --   --  4.6 4.6 3.8    Liver Function Tests:  Recent Labs Lab 09/22/16 0220 09/23/16 0827 09/24/16 0537 09/25/16 0625  AST 12*  --   --   --   ALT 10*  --   --   --   ALKPHOS 54  --   --   --   BILITOT 0.4  --   --   --   PROT 6.0*  --   --   --   ALBUMIN 2.7* 2.5* 2.5* 2.5*    Recent Labs Lab 09/25/16 0844  LIPASE 32   No results for input(s): AMMONIA in the last 168 hours. Coagulation Profile: No results for input(s): INR, PROTIME in the last 168 hours. Cardiac Enzymes: No results for input(s): CKTOTAL, CKMB, CKMBINDEX, TROPONINI in the last 168 hours. BNP (last 3 results) No results for input(s): PROBNP in the last 8760 hours.  CBG: No results for input(s): GLUCAP in the last 168 hours.  Studies: Dg Chest 2 View  Result Date: 09/25/2016 CLINICAL DATA:  Cough.  Hypertension. EXAM: CHEST  2 VIEW COMPARISON:  September 22, 2016 FINDINGS: There is no edema or consolidation. Heart size and pulmonary vascular normal. No adenopathy. There is aortic atherosclerosis. No bone lesions. IMPRESSION: No edema or consolidation. The ill-defined opacity overlying the heart anteriorly on the lateral view is no longer evident. Stable cardiac silhouette. There is aortic atherosclerosis. Electronically Signed   By: Lowella Grip III M.D.   On: 09/25/2016 07:32     Scheduled Meds: . atorvastatin  40 mg Oral  Daily  . citalopram  20 mg Oral Daily  . clopidogrel  75 mg Oral Daily  . famotidine  20 mg Oral Daily  . heparin  5,000 Units Subcutaneous Q8H  . levothyroxine  100 mcg Oral QAC breakfast  . metoCLOPramide  5 mg Oral TID AC  . polyethylene glycol  17 g Oral Daily  . senna-docusate  1 tablet Oral BID  . tamsulosin  0.4 mg Oral Daily   Continuous Infusions:  PRN Meds: ondansetron (ZOFRAN) IV, promethazine **OR** promethazine **OR** promethazine  Time spent: 15 minutes  Author: Verneita Griffes, MD [facility] (P) [phone]  If 7PM-7AM,  please contact night-coverage at [email or web address], password [facility]

## 2016-09-29 ENCOUNTER — Encounter (HOSPITAL_COMMUNITY): Payer: Self-pay | Admitting: Interventional Radiology

## 2016-09-29 ENCOUNTER — Inpatient Hospital Stay (HOSPITAL_COMMUNITY): Payer: Medicare HMO

## 2016-09-29 HISTORY — PX: IR GENERIC HISTORICAL: IMG1180011

## 2016-09-29 LAB — RENAL FUNCTION PANEL
ANION GAP: 10 (ref 5–15)
Albumin: 3 g/dL — ABNORMAL LOW (ref 3.5–5.0)
Albumin: 3 g/dL — ABNORMAL LOW (ref 3.5–5.0)
Anion gap: 12 (ref 5–15)
BUN: 43 mg/dL — ABNORMAL HIGH (ref 6–20)
BUN: 47 mg/dL — ABNORMAL HIGH (ref 6–20)
CALCIUM: 9.2 mg/dL (ref 8.9–10.3)
CHLORIDE: 110 mmol/L (ref 101–111)
CHLORIDE: 107 mmol/L (ref 101–111)
CO2: 18 mmol/L — AB (ref 22–32)
CO2: 20 mmol/L — AB (ref 22–32)
CREATININE: 7.32 mg/dL — AB (ref 0.44–1.00)
Calcium: 9.2 mg/dL (ref 8.9–10.3)
Creatinine, Ser: 7.2 mg/dL — ABNORMAL HIGH (ref 0.44–1.00)
GFR calc non Af Amer: 5 mL/min — ABNORMAL LOW (ref >60)
GFR calc non Af Amer: 5 mL/min — ABNORMAL LOW (ref >60)
GFR, EST AFRICAN AMERICAN: 6 mL/min — AB (ref >60)
GFR, EST AFRICAN AMERICAN: 6 mL/min — AB (ref >60)
GLUCOSE: 89 mg/dL (ref 65–99)
Glucose, Bld: 102 mg/dL — ABNORMAL HIGH (ref 65–99)
POTASSIUM: 3.7 mmol/L (ref 3.5–5.1)
Phosphorus: 4.8 mg/dL — ABNORMAL HIGH (ref 2.5–4.6)
Phosphorus: 5.1 mg/dL — ABNORMAL HIGH (ref 2.5–4.6)
Potassium: 3.9 mmol/L (ref 3.5–5.1)
SODIUM: 139 mmol/L (ref 135–145)
Sodium: 138 mmol/L (ref 135–145)

## 2016-09-29 LAB — CBC
HCT: 34.9 % — ABNORMAL LOW (ref 36.0–46.0)
HEMOGLOBIN: 11.2 g/dL — AB (ref 12.0–15.0)
MCH: 25.4 pg — AB (ref 26.0–34.0)
MCHC: 32.1 g/dL (ref 30.0–36.0)
MCV: 79.1 fL (ref 78.0–100.0)
PLATELETS: 163 10*3/uL (ref 150–400)
RBC: 4.41 MIL/uL (ref 3.87–5.11)
RDW: 16.6 % — ABNORMAL HIGH (ref 11.5–15.5)
WBC: 7.9 10*3/uL (ref 4.0–10.5)

## 2016-09-29 MED ORDER — LIDOCAINE HCL (PF) 1 % IJ SOLN
INTRAMUSCULAR | Status: AC
  Filled 2016-09-29: qty 10

## 2016-09-29 MED ORDER — HEPARIN SODIUM (PORCINE) 1000 UNIT/ML DIALYSIS
1000.0000 [IU] | INTRAMUSCULAR | Status: DC | PRN
  Administered 2016-10-01: 1000 [IU] via INTRAVENOUS_CENTRAL

## 2016-09-29 MED ORDER — SODIUM CHLORIDE 0.9 % IV SOLN: 100.0000 mL | INTRAVENOUS | Status: DC | PRN

## 2016-09-29 MED ORDER — PENTAFLUOROPROP-TETRAFLUOROETH EX AERO: 1.0000 "application " | INHALATION_SPRAY | CUTANEOUS | Status: DC | PRN

## 2016-09-29 MED ORDER — HEPARIN SODIUM (PORCINE) 1000 UNIT/ML IJ SOLN
INTRAMUSCULAR | Status: AC
  Filled 2016-09-29: qty 1

## 2016-09-29 MED ORDER — LIDOCAINE-PRILOCAINE 2.5-2.5 % EX CREA: 1.0000 "application " | TOPICAL_CREAM | CUTANEOUS | Status: DC | PRN

## 2016-09-29 MED ORDER — ALTEPLASE 2 MG IJ SOLR
2.0000 mg | Freq: Once | INTRAMUSCULAR | Status: AC | PRN
  Administered 2016-10-07: 2 mg

## 2016-09-29 MED ORDER — LIDOCAINE HCL (PF) 1 % IJ SOLN
INTRAMUSCULAR | Status: AC | PRN
  Administered 2016-09-29: 5 mL

## 2016-09-29 MED ORDER — LIDOCAINE HCL (PF) 1 % IJ SOLN: 5.0000 mL | INTRAMUSCULAR | Status: DC | PRN

## 2016-09-29 NOTE — Procedures (Signed)
Interventional Radiology Procedure Note  Procedure: Placement of a right IJ [place] non-tunneled HD catheter with additional CVC port.  Tip location:  Upper RA.  Complications: None  Estimated Blood Loss: 0  Recommendations: - Routine line care  Signed,  Criselda Peaches, MD

## 2016-09-29 NOTE — Progress Notes (Signed)
[facility] Progress Note    Assessment/ Plan:    1.  Acute kidney injury superimposed on CKD III- likely due to prolonged poor PO intake but does not improve off fluids.  Still urinating- non, oliguric. Glucose on UA but no h/o diabetes, adding HgbA1c (unremarkable), ANA, ANCA negative; complements WNL, HIV neg, hepatitis pending neg, SPEP unremarkable serum free light chains with ratio of 1.49.  Discussed with pt renal biopsy- agreement to proceed, stopped Plavix- safe to proceed with renal biopsy on 1/25 per Ir.  However, creatinine rising and she is exhibiting uremic symptoms.  Will get HD cath in and start HD while awaiting biopsy.  2.  Mixed  Anion gap and nonanion gap metabolic acidosis: oral bicarb  3.  Chronic pancreatitis/nv:still ongoing, better with reglan, think uremia likely now  4.  UTI- s/p CTX, urine culture negative--? AIN  5.  H/o CVA: on Plavix, have stopped this in setting of plans for biopsy (off 1/20).  Subjective:    Pt continues to have nausea when not getting antiemetics.  Cr still rising.  Feel this is not due to a GI cause or her chronic pancreatitis.  Feel this is uremia.     Objective:   BP 130/70 (BP Location: Left Arm)   Pulse 76   Temp 98.3 F (36.8 C) (Oral)   Resp 18   Ht 5\' 2"  (1.575 m)   Wt 105.6 kg (232 lb 12.9 oz)   SpO2 98%   BMI 42.58 kg/m   Intake/Output Summary (Last 24 hours) at 09/29/16 1058 Last data filed at 09/29/16 5188  Gross per 24 hour  Intake             1190 ml  Output             2800 ml  Net            -1610 ml   Weight change:   Physical Exam: GEN: laying in bed, NAD HEENT: EOMI, PERRL, MMM NECK: supple PULM: CTAB CV RRR ABD mildly diffusely tender EXT; no LE edema NEURO AAO x 4  Imaging: No results found.  Labs: BMET  Recent Labs Lab 09/23/16 0827 09/24/16 0537 09/25/16 4166 09/26/16 0748 09/26/16 1306 09/27/16 0512 09/28/16 0359 09/29/16 0659  NA 144 143 143 145 143 142  140 138  K 4.0 3.8 3.0* 3.0* 3.1* 3.3* 3.6 3.7  CL 120* 120* 111 110 108 109 110 110  CO2 16* 15* 22 26 25 22  21* 18*  GLUCOSE 94 97 96 95 103* 109* 90 102*  BUN 31* 29* 28* 28* 28* 31* 38* 43*  CREATININE 5.82* 5.95* 5.67* 6.07* 6.27* 6.52* 6.92* 7.20*  CALCIUM 8.1* 8.1* 8.1* 8.4* 8.6* 8.9 9.0 9.2  PHOS 4.6 4.6 3.8 4.0  --  3.9 4.3 4.8*   CBC  Recent Labs Lab 09/24/16 0537 09/25/16 0625 09/26/16 0748 09/27/16 0512  WBC 5.5 5.3 6.1 6.4  HGB 9.8* 9.5* 10.0* 10.3*  HCT 30.9* 29.2* 31.3* 32.8*  MCV 79.2 77.5* 77.9* 79.0  PLT 140* 134* 147* 152    Medications:    . citalopram  20 mg Oral Daily  . famotidine  20 mg Oral Daily  . heparin  5,000 Units Subcutaneous Q8H  . levothyroxine  100 mcg Oral QAC breakfast  . metoCLOPramide  5 mg Oral TID AC  . polyethylene glycol  17 g Oral Daily  . senna-docusate  1 tablet Oral BID  . tamsulosin  0.4 mg Oral  Daily      Madelon Lips, MD 09/29/2016, 10:58 AM

## 2016-09-29 NOTE — Progress Notes (Signed)
[facility] Progress Note  Patient: Rachael [name] NAT:[id number]   PCP: No primary care provider on file. DOB: [birth year]   DOA: 09/22/2016   DOS: 09/25/2016   Date of Service: the patient was seen and examined on 09/25/2016  Brief hospital course: Pt. with PMH of CKD, HTN, hypothyroidism; admitted on 09/22/2016, with complaint of nausea and vomiting, was found to have AKI. Patient was transferred to [facility] from [facility] for nephrology opinion. awiaiting resolution of kindey function Tentatively having renal biopsy 1/25 after plavix washout  Assessment and Plan: 1. Acute renal failure superimposed on chronic kidney disease ([place])  Suspected UTI Postobstructive/post ATN diuresis.  Patient had chronic kidney disease stage III. Appears to be having worsening of her poor oral intake secondary to nausea and vomiting. ACE inhibitor's or any NSAIDs. Patient was on some antibiotics--unclear which FeNa based on lab work on 09/22/2016 19.6=post renal or obstructive etiology. Continue Foley catheter-started the patient on Flomax. SPEP hypoalbuminemia without any M spike. Plasma lambda light chains elevated At 53.4, lambda 35.9 however ratio not elevated  complement C4 slighlty elevated.  C3 nl. still pending, Anca ANA HIV hepatitis RPR within normal limits UA appears to be bland. Urine culture 09/22/16 is negative so IV ceftriaxone was then discontinued note LFT's were wnl  Likely renal biopsy 1/25 and is off plavix as of 1/20 Expect if doesn't improve will probably require dialysis Nephrology planning to place [facility] and planning for dilaysis   2. Hypothyroidism. Continue Synthroid 100 mcg daily  3. Recurrent nausea and vomiting. Constipation CT abdomen in other facility 09/19/2016 shows no acute abnormalities other than hiatal hernia. Ultrasound abdomen 09/21/2016 shows no evidence of obstruction, no hydronephrosis, bilateral renal size roughly 9 cm added Reglan to  medications--this seemed to help to some extent Uremia might explain some symptoms?  Continue Zofran,Phenergan. Patient is able to tolerate the diet. Starting the patient on MiraLAX and Senokot.  4. GERD. Changing PPI to Pepcid  Bowel regimen: last BM prior to admission Diet: renal die DVT Prophylaxis: subcutaneous Heparin  Advance goals of care discussion: FULL CODE  Family Communication: no family was present at bedside, at the time of interview.   Disposition:  Discharge to HOME.  Start     Dose/Rate Route Frequency Ordered Stop   09/22/16 0300  cefTRIAXone (ROCEPHIN) 1 g in dextrose 5 % 50 mL IVPB  Status:  Discontinued     1 g 100 mL/hr over 30 Minutes Intravenous Every 24 hours 09/22/16 0226 09/24/16 1559      Subjective:   No new changes Eating drinking No specific other problem  Objective: Physical Exam: Vitals:   09/24/16 2010 09/24/16 2056 09/25/16 0505 09/25/16 1000  BP: (!) 161/70  125/60 (!) 158/70  Pulse: 74  75 81  Resp: 19  20 20   Temp: 98.6 F (37 C)  98.3 F (36.8 C) 98.7 F (37.1 C)  TempSrc: Oral  Oral Oral  SpO2: 100%  97% 98%  Weight:  105.5 kg (232 lb 8 oz)    Height:        Intake/Output Summary (Last 24 hours) at 09/25/16 1333 Last data filed at 09/25/16 0900  Gross per 24 hour  Intake          2285.83 ml  Output             2350 ml  Net           -64.17 ml   [employer]   09/22/16 0300  09/22/16 2300 09/24/16 2056  Weight: 106 kg (233 lb 11 oz) 106 kg (233 lb 11 oz) 105.5 kg (232 lb 8 oz)    General: Alert, Awake and Oriented x3 Eyes: PERRL, Conjunctiva normal, no ict ENT: Oral Mucosa clear moist. Neck: no JVD Cardiovascular: S1 and S2 Present, no Murmur Respiratory: Bilateral Air entry equal-Clear to Auscultation Abdomen: Bowel Sound present, Soft and no tenderness, no rebound no guarding Skin: no redness, no Rash, no induration Extremities: no Pedal edema, no calf tenderness Neurologic: Grossly no focal neuro  deficit.  Data Reviewed: CBC:  Recent Labs Lab 09/25/16 0844  LIPASE 32   No results for input(s): AMMONIA in the last 168 hours. Coagulation Profile: No results for input(s): INR, PROTIME in the last 168 hours. Cardiac Enzymes: No results for input(s): CKTOTAL, CKMB, CKMBINDEX, TROPONINI in the last 168 hours. BNP (last 3 results) No results for input(s): PROBNP in the last 8760 hours.  CBG: No results for input(s): GLUCAP in the last 168 hours.  Studies: Dg Chest 2 View  Result Date: 09/25/2016 CLINICAL DATA:  Cough.  Hypertension. EXAM: CHEST  2 VIEW COMPARISON:  September 22, 2016 FINDINGS: There is no edema or consolidation. Heart size and pulmonary vascular normal. No adenopathy. There is aortic atherosclerosis. No bone lesions. IMPRESSION: No edema or consolidation. The ill-defined opacity overlying the heart anteriorly on the lateral view is no longer evident. Stable cardiac silhouette. There is aortic atherosclerosis. Electronically Signed   By: Lowella Grip III M.D.   On: 09/25/2016 07:32     Scheduled Meds: . atorvastatin  40 mg Oral Daily  . citalopram  20 mg Oral Daily  . clopidogrel  75 mg Oral Daily  . famotidine  20 mg Oral Daily  . heparin  5,000 Units Subcutaneous Q8H  . levothyroxine  100 mcg Oral QAC breakfast  . metoCLOPramide  5 mg Oral TID AC  . polyethylene glycol  17 g Oral Daily  . senna-docusate  1 tablet Oral BID  . tamsulosin  0.4 mg Oral Daily   Continuous Infusions:  PRN Meds: ondansetron (ZOFRAN) IV, promethazine **OR** promethazine **OR** promethazine  Time spent: 15 minutes  Author: Verneita Griffes, MD [facility] (P) [phone]  If 7PM-7AM, please contact night-coverage at [email or web address], password [facility]

## 2016-09-30 LAB — RENAL FUNCTION PANEL
ALBUMIN: 3 g/dL — AB (ref 3.5–5.0)
Anion gap: 11 (ref 5–15)
BUN: 28 mg/dL — AB (ref 6–20)
CHLORIDE: 104 mmol/L (ref 101–111)
CO2: 21 mmol/L — ABNORMAL LOW (ref 22–32)
CREATININE: 4.52 mg/dL — AB (ref 0.44–1.00)
Calcium: 8.5 mg/dL — ABNORMAL LOW (ref 8.9–10.3)
GFR calc Af Amer: 11 mL/min — ABNORMAL LOW (ref >60)
GFR, EST NON AFRICAN AMERICAN: 10 mL/min — AB (ref >60)
GLUCOSE: 101 mg/dL — AB (ref 65–99)
PHOSPHORUS: 2.9 mg/dL (ref 2.5–4.6)
Potassium: 2.9 mmol/L — ABNORMAL LOW (ref 3.5–5.1)
Sodium: 136 mmol/L (ref 135–145)

## 2016-09-30 MED ORDER — POTASSIUM CHLORIDE CRYS ER 20 MEQ PO TBCR
40.0000 meq | EXTENDED_RELEASE_TABLET | Freq: Once | ORAL | Status: AC
Start: 2016-09-30 — End: 2016-09-30
  Administered 2016-09-30: 40 meq via ORAL
  Filled 2016-09-30: qty 2

## 2016-09-30 NOTE — Consult Note (Signed)
Chief Complaint: Patient was seen in consultation today for random renal biopsy at the request of Dr Laurena Bering  Referring Physician(s): Dr Laurena Bering  Supervising Physician: Daryll Brod  Patient Status: [facility] - In-pt  History of Present Illness: Rachael [name] is a 61 y.o. female    Acute on chronic renal failure N/V abd pain Worsening Cr level nontunneled HD catheter placed in IR 1/22  Need random renal biopsy per Nephrology On Plavix for Hx CVA Last dose 1/20 Scheduled for biopsy 1/25 in Radiology   Past Medical History:  Diagnosis Date  . CHF (congestive heart failure) ([place])    Heart cath in 2006  . CKD (chronic kidney disease)    Baseline creat 1.4-1.7  . CVA (cerebral vascular accident) ([place])    No residual deficit  . HTN (hypertension)   . Hypothyroidism     Past Surgical History:  Procedure Laterality Date  . CARDIAC CATHETERIZATION  2006  . CHOLECYSTECTOMY    . IR GENERIC HISTORICAL  09/29/2016   IR FLUORO GUIDE CV LINE RIGHT 09/29/2016 Jacqulynn Cadet, MD [facility]-INTERV RAD  . IR GENERIC HISTORICAL  09/29/2016   IR US GUIDE VASC ACCESS RIGHT [facility]    Allergies: Penicillins and Sulfa antibiotics  Medications: Prior to Admission medications   Medication Sig Start Date End Date Taking? Authorizing Provider  atorvastatin (LIPITOR) 40 MG tablet Take 40 mg by mouth daily.    Yes Historical Provider, MD  citalopram (CELEXA) 40 MG tablet Take 40 mg by mouth daily.   Yes Historical Provider, MD  clopidogrel (PLAVIX) 75 MG tablet Take 75 mg by mouth daily.   Yes Historical Provider, MD  levothyroxine (SYNTHROID, LEVOTHROID) 100 MCG tablet Take 100 mcg by mouth daily before breakfast.   Yes Historical Provider, MD  pantoprazole (PROTONIX) 40 MG tablet Take 40 mg by mouth daily.   Yes Historical Provider, MD     Family History  Problem Relation Age of Onset  . Hypertension Mother   . Stroke Mother   . Coronary artery disease Mother     Late onset  .  Hypertension Father   . Coronary artery disease Father     Late onset  . Hypertension Sister   . Hypertension Brother     Social History   Social History  . Marital status: Single    Spouse name: N/A  . Number of children: N/A  . Years of education: N/A   Social History Main Topics  . Smoking status: Never Smoker  . Smokeless tobacco: Never Used  . Alcohol use No  . Drug use: No  . Sexual activity: Not Asked   Other Topics Concern  . None   Social History Narrative  . None     Review of Systems: A 12 point ROS discussed and pertinent positives are indicated in the HPI above.  All other systems are negative.  Review of Systems  Constitutional: Positive for activity change, appetite change and fatigue. Negative for fever.  Respiratory: Negative for shortness of breath.   Cardiovascular: Negative for chest pain.  Gastrointestinal: Positive for abdominal pain and nausea. Negative for vomiting.  Neurological: Positive for weakness.  Psychiatric/Behavioral: Negative for behavioral problems and confusion.    Vital Signs: BP 131/74 (BP Location: Left Arm)   Pulse 91   Temp 98.5 F (36.9 C) (Oral)   Resp 15   Ht 5\' 2"  (1.575 m)   Wt 233 lb 11 oz (106 kg)   SpO2 95%  BMI 42.74 kg/m   Physical Exam  Constitutional: She is oriented to person, place, and time.  Cardiovascular: Normal rate and regular rhythm.   Pulmonary/Chest: Effort normal and breath sounds normal.  Abdominal: Soft. Bowel sounds are normal.  Musculoskeletal: Normal range of motion.  Neurological: She is alert and oriented to person, place, and time.  Skin: Skin is warm and dry.  Psychiatric: She has a normal mood and affect. Her behavior is normal. Judgment and thought content normal.  Nursing note and vitals reviewed.   Mallampati Score:  MD Evaluation Airway: WNL Heart: WNL Abdomen: WNL Chest/ Lungs: WNL ASA  Classification: 2 Mallampati/Airway Score: Two  Imaging: Dg Chest 2  View  Result Date: 09/25/2016 CLINICAL DATA:  Cough.  Hypertension. EXAM: CHEST  2 VIEW COMPARISON:  September 22, 2016 FINDINGS: There is no edema or consolidation. Heart size and pulmonary vascular normal. No adenopathy. There is aortic atherosclerosis. No bone lesions. IMPRESSION: No edema or consolidation. The ill-defined opacity overlying the heart anteriorly on the lateral view is no longer evident. Stable cardiac silhouette. There is aortic atherosclerosis. Electronically Signed   By: Lowella Grip III M.D.   On: 09/25/2016 07:32   Dg Chest 2 View  Result Date: 09/22/2016 CLINICAL DATA:  Nonproductive cough.  Shortness of breath EXAM: CHEST  2 VIEW COMPARISON:  CT 06/07/2016 . FINDINGS: Mediastinum and hilar structures normal. Cardiomegaly with normal pulmonary vascularity. Questionable density is noted over the anterior lung bases on lateral view. Follow-up PA lateral chest x-ray is suggested. This density persists CT can be obtained. IMPRESSION: 1. Questionable density is noted over the anterior aspect of the lower lung bases on lateral view. Follow-up chest x-ray suggested for further evaluation. This density persists nonenhanced chest CT is suggested to further evaluate. 2. Cardiomegaly.  No pulmonary venous congestion. Electronically Signed   By: Marcello Moores  Register   On: 09/22/2016 12:09   Ir Cyndy Freeze Guide Cv Line Right  Result Date: 09/29/2016 INDICATION: 61 year old female in need of hemodialysis access. EXAM: IR RIGHT FLOURO GUIDE CV LINE; IR ULTRASOUND GUIDANCE VASC ACCESS RIGHT MEDICATIONS: None ANESTHESIA/SEDATION: None FLUOROSCOPY TIME:  Fluoroscopy Time: 0 minutes 18 seconds (11 mGy). COMPLICATIONS: None immediate. PROCEDURE: Informed written consent was obtained from the patient after a thorough discussion of the procedural risks, benefits and alternatives. All questions were addressed. Maximal Sterile Barrier Technique was utilized including caps, mask, sterile gowns, sterile gloves,  sterile drape, hand hygiene and skin antiseptic. A timeout was performed prior to the initiation of the procedure. The right internal jugular vein was interrogated with ultrasound and found to be widely patent. An image was obtained and stored for the medical record. Local anesthesia was attained by infiltration with 1% lidocaine. A small dermatotomy was made. Under real-time sonographic guidance, the vessel was punctured with an 18 gauge needle. An Amplatz wire was advanced into the right heart and into the inferior vena cava. The needle was removed over the wire and a 20 cm Mahurkar non tunneled hemodialysis catheter with an additional central venous access port was advanced over the wire and position with the tip in the upper right atrium. The wire was removed. The catheter was secured to the skin with 0 Prolene suture. The catheter was found to work well and was flushed with heparinized saline. Sterile bandages were applied. The patient tolerated the procedure well. IMPRESSION: Successful placement of a 20 cm Mahurkar non tunneled hemodialysis catheter with additional central venous access port via a right internal jugular vein approach.  The catheter tips are in the upper right atrium and ready for immediate use. Electronically Signed   By: Jacqulynn Cadet M.D.   On: 09/29/2016 15:30   Ir US Guide Vasc Access Right  Result Date: 09/29/2016 INDICATION: 61 year old female in need of hemodialysis access. EXAM: IR RIGHT FLOURO GUIDE CV LINE; IR ULTRASOUND GUIDANCE VASC ACCESS RIGHT MEDICATIONS: None ANESTHESIA/SEDATION: None FLUOROSCOPY TIME:  Fluoroscopy Time: 0 minutes 18 seconds (11 mGy). COMPLICATIONS: None immediate. PROCEDURE: Informed written consent was obtained from the patient after a thorough discussion of the procedural risks, benefits and alternatives. All questions were addressed. Maximal Sterile Barrier Technique was utilized including caps, mask, sterile gowns, sterile gloves, sterile drape,  hand hygiene and skin antiseptic. A timeout was performed prior to the initiation of the procedure. The right internal jugular vein was interrogated with ultrasound and found to be widely patent. An image was obtained and stored for the medical record. Local anesthesia was attained by infiltration with 1% lidocaine. A small dermatotomy was made. Under real-time sonographic guidance, the vessel was punctured with an 18 gauge needle. An Amplatz wire was advanced into the right heart and into the inferior vena cava. The needle was removed over the wire and a 20 cm Mahurkar non tunneled hemodialysis catheter with an additional central venous access port was advanced over the wire and position with the tip in the upper right atrium. The wire was removed. The catheter was secured to the skin with 0 Prolene suture. The catheter was found to work well and was flushed with heparinized saline. Sterile bandages were applied. The patient tolerated the procedure well. IMPRESSION: Successful placement of a 20 cm Mahurkar non tunneled hemodialysis catheter with additional central venous access port via a right internal jugular vein approach. The catheter tips are in the upper right atrium and ready for immediate use. Electronically Signed   By: Jacqulynn Cadet M.D.   On: 09/29/2016 15:30    Labs:  CBC:  Recent Labs  09/25/16 0625 09/26/16 0748 09/27/16 0512 09/29/16 1318  WBC 5.3 6.1 6.4 7.9  HGB 9.5* 10.0* 10.3* 11.2*  HCT 29.2* 31.3* 32.8* 34.9*  PLT 134* 147* 152 163    COAGS: No results for input(s): INR, APTT in the last 8760 hours.  BMP:  Recent Labs  09/28/16 0359 09/29/16 0659 09/29/16 1318 09/30/16 0523  NA 140 138 139 136  K 3.6 3.7 3.9 2.9*  CL 110 110 107 104  CO2 21* 18* 20* 21*  GLUCOSE 90 102* 89 101*  BUN 38* 43* 47* 28*  CALCIUM 9.0 9.2 9.2 8.5*  CREATININE 6.92* 7.20* 7.32* 4.52*  GFRNONAA 6* 5* 5* 10*  GFRAA 7* 6* 6* 11*    LIVER FUNCTION TESTS:  Recent Labs   09/22/16 0220  09/26/16 1306  09/28/16 0359 09/29/16 0659 09/29/16 1318 09/30/16 0523  BILITOT 0.4  --  0.4  --   --   --   --   --   AST 12*  --  20  --   --   --   --   --   ALT 10*  --  14  --   --   --   --   --   ALKPHOS 54  --  51  --   --   --   --   --   PROT 6.0*  --  6.2*  --   --   --   --   --   ALBUMIN 2.7*  < >  2.8*  < > 2.9* 3.0* 3.0* 3.0*  < > = values in this interval not displayed.  TUMOR MARKERS: No results for input(s): AFPTM, CEA, CA199, CHROMGRNA in the last 8760 hours.  Assessment and Plan:  Acute on chronic renal failure Neph requesting renal biopsy Scheduled for 1/25---LD Plavix 1/20 Risks and Benefits discussed with the patient including, but not limited to bleeding, infection, damage to adjacent structures or low yield requiring additional tests. All of the patient's questions were answered, patient is agreeable to proceed. Consent signed and in chart.  Thank you for this interesting consult.  I greatly enjoyed meeting Latrell Reitan Stangler and look forward to participating in their care.  A copy of this report was sent to the requesting provider on this date.  Electronically Signed: Monia Sabal A 09/30/2016, 11:46 AM   I spent a total of 20 Minutes    in face to face in clinical consultation, greater than 50% of which was counseling/coordinating care for random renal biopsy

## 2016-09-30 NOTE — Progress Notes (Signed)
[facility] Progress Note    Subjective:    Lying in bed No issues with HD yesterday other than anxiety (wonders if can have med for next HD) Still making urine    Objective:   BP 131/74 (BP Location: Left Arm)   Pulse 91   Temp 98.5 F (36.9 C) (Oral)   Resp 15   Ht 5\' 2"  (1.575 m)   Wt 106 kg (233 lb 11 oz)   SpO2 95%   BMI 42.74 kg/m   Intake/Output Summary (Last 24 hours) at 09/30/16 1750 Last data filed at 09/30/16 1415  Gross per 24 hour  Intake              510 ml  Output             1650 ml  Net            -1140 ml   Weight change: 0.4 kg (14.1 oz)  Physical Exam: GEN: laying in bed, NAD No JVD Lungs clear anteriorly Regular, s1S2 No S3 Abd soft non tender No edema of LE's R IJ trialysis cath in place dry dressing No asterixus  Imaging: Ir Fluoro Guide Cv Line Right  Result Date: 09/29/2016 INDICATION: 61 year old female in need of hemodialysis access. EXAM: IR RIGHT FLOURO GUIDE CV LINE; IR ULTRASOUND GUIDANCE VASC ACCESS RIGHT MEDICATIONS: None ANESTHESIA/SEDATION: None FLUOROSCOPY TIME:  Fluoroscopy Time: 0 minutes 18 seconds (11 mGy). COMPLICATIONS: None immediate. PROCEDURE: Informed written consent was obtained from the patient after a thorough discussion of the procedural risks, benefits and alternatives. All questions were addressed. Maximal Sterile Barrier Technique was utilized including caps, mask, sterile gowns, sterile gloves, sterile drape, hand hygiene and skin antiseptic. A timeout was performed prior to the initiation of the procedure. The right internal jugular vein was interrogated with ultrasound and found to be widely patent. An image was obtained and stored for the medical record. Local anesthesia was attained by infiltration with 1% lidocaine. A small dermatotomy was made. Under real-time sonographic guidance, the vessel was punctured with an 18 gauge needle. An Amplatz wire was advanced into the right heart and into the  inferior vena cava. The needle was removed over the wire and a 20 cm Mahurkar non tunneled hemodialysis catheter with an additional central venous access port was advanced over the wire and position with the tip in the upper right atrium. The wire was removed. The catheter was secured to the skin with 0 Prolene suture. The catheter was found to work well and was flushed with heparinized saline. Sterile bandages were applied. The patient tolerated the procedure well. IMPRESSION: Successful placement of a 20 cm Mahurkar non tunneled hemodialysis catheter with additional central venous access port via a right internal jugular vein approach. The catheter tips are in the upper right atrium and ready for immediate use. Electronically Signed   By: Jacqulynn Cadet M.D.   On: 09/29/2016 15:30   Ir US Guide Vasc Access Right  Result Date: 09/29/2016 INDICATION: 61 year old female in need of hemodialysis access. EXAM: IR RIGHT FLOURO GUIDE CV LINE; IR ULTRASOUND GUIDANCE VASC ACCESS RIGHT MEDICATIONS: None ANESTHESIA/SEDATION: None FLUOROSCOPY TIME:  Fluoroscopy Time: 0 minutes 18 seconds (11 mGy). COMPLICATIONS: None immediate. PROCEDURE: Informed written consent was obtained from the patient after a thorough discussion of the procedural risks, benefits and alternatives. All questions were addressed. Maximal Sterile Barrier Technique was utilized including caps, mask, sterile gowns, sterile gloves, sterile drape, hand hygiene and skin antiseptic. A timeout was  performed prior to the initiation of the procedure. The right internal jugular vein was interrogated with ultrasound and found to be widely patent. An image was obtained and stored for the medical record. Local anesthesia was attained by infiltration with 1% lidocaine. A small dermatotomy was made. Under real-time sonographic guidance, the vessel was punctured with an 18 gauge needle. An Amplatz wire was advanced into the right heart and into the inferior vena  cava. The needle was removed over the wire and a 20 cm Mahurkar non tunneled hemodialysis catheter with an additional central venous access port was advanced over the wire and position with the tip in the upper right atrium. The wire was removed. The catheter was secured to the skin with 0 Prolene suture. The catheter was found to work well and was flushed with heparinized saline. Sterile bandages were applied. The patient tolerated the procedure well. IMPRESSION: Successful placement of a 20 cm Mahurkar non tunneled hemodialysis catheter with additional central venous access port via a right internal jugular vein approach. The catheter tips are in the upper right atrium and ready for immediate use. Electronically Signed   By: Jacqulynn Cadet M.D.   On: 09/29/2016 15:30    Labs: BMET  Recent Labs Lab 09/25/16 9518 09/26/16 0748 09/26/16 1306 09/27/16 0512 09/28/16 0359 09/29/16 0659 09/29/16 1318 09/30/16 0523  NA 143 145 143 142 140 138 139 136  K 3.0* 3.0* 3.1* 3.3* 3.6 3.7 3.9 2.9*  CL 111 110 108 109 110 110 107 104  CO2 22 26 25 22  21* 18* 20* 21*  GLUCOSE 96 95 103* 109* 90 102* 89 101*  BUN 28* 28* 28* 31* 38* 43* 47* 28*  CREATININE 5.67* 6.07* 6.27* 6.52* 6.92* 7.20* 7.32* 4.52*  CALCIUM 8.1* 8.4* 8.6* 8.9 9.0 9.2 9.2 8.5*  PHOS 3.8 4.0  --  3.9 4.3 4.8* 5.1* 2.9   CBC  Recent Labs Lab 09/25/16 0625 09/26/16 0748 09/27/16 0512 09/29/16 1318  WBC 5.3 6.1 6.4 7.9  HGB 9.5* 10.0* 10.3* 11.2*  HCT 29.2* 31.3* 32.8* 34.9*  MCV 77.5* 77.9* 79.0 79.1  PLT 134* 147* 152 163    Medications:    . citalopram  20 mg Oral Daily  . famotidine  20 mg Oral Daily  . heparin  5,000 Units Subcutaneous Q8H  . levothyroxine  100 mcg Oral QAC breakfast  . metoCLOPramide  5 mg Oral TID AC  . polyethylene glycol  17 g Oral Daily  . senna-docusate  1 tablet Oral BID  . tamsulosin  0.4 mg Oral Daily     Assessment/ Plan:    1. Acute kidney injury superimposed on CKD III-  likely due to prolonged poor PO intake but does not improve off fluids. Still urinating- non -oliguric. Glucose on UA but no h/o diabetes, A1C 5.6, ANA, ANCA negative; complements WNL, HIV neg, hepatitis B and C neg, SPEP unremarkable,  serum free light chains with ratio of 1.49.  Diagnosis obscure - for renal biopsy on 10/02/16. (stopped Plavix- safe to proceed with renal biopsy on 1/25 per IR).  Got temp HD cath and 1st HD 1/22. Will plan on HD again tomorrow in anticipation of bx on Thursday. 2. Hypokalemia - rec'd 40 K today po. 4K bath tomorrow. Check K pre HD 3. Chronic pancreatitis/nv:still ongoing, better with reglan 4. UTI- s/p CTX, urine culture negative--? AIN 5. H/o CVA: on Plavix, have stopped this in setting of plans for biopsy (off 1/20).   Jamal Maes, MD  [facility] [phone] Pager 09/30/2016, 5:50 PM

## 2016-09-30 NOTE — Progress Notes (Signed)
[facility] Progress Note  Patient: Rachael [name] RCB:[id number]   PCP: No primary care provider on file. DOB: 1956/08/22   DOA: 09/22/2016   DOS: 09/25/2016   Date of Service: the patient was seen and examined on 09/25/2016  Brief hospital course: Pt. with PMH of CKD, HTN, hypothyroidism; admitted on 09/22/2016, with complaint of nausea and vomiting, was found to have AKI. Patient was transferred to [facility] from [facility] for nephrology opinion. awiaiting resolution of kindey function Tentatively having renal biopsy 1/25 after plavix washout R sided HD cath placed 1/22  Had first Hd yesterday 1/22   Assessment and Plan: 1. Acute renal failure superimposed on chronic kidney disease ([place])  Suspected UTI Postobstructive/post ATN diuresis.  Patient had chronic kidney disease stage III. Appears to be having worsening of her poor oral intake secondary to nausea and vomiting. ACE inhibitor's or any NSAIDs. Patient was on some antibiotics--unclear which FeNa based on lab work on 09/22/2016 19.6=post renal or obstructive etiology. Continue Foley catheter-started the patient on Flomax. SPEP hypoalbuminemia without any M spike. Plasma lambda light chains elevated At 53.4, lambda 35.9 however ratio not elevated  complement C4 slighlty elevated.  C3 nl.  Anca ANA HIV hepatitis RPR within normal limits UA appears to be bland. Urine culture 09/22/16 is negative so IV ceftriaxone was then discontinued note LFT's were wnl  Likely renal biopsy 1/25 and is off plavix as of 1/20 TDC placed and first dialysis 1/22   2. Hypothyroidism. Continue Synthroid 100 mcg daily  3. Recurrent nausea and vomiting. Constipation CT abdomen in other facility 09/19/2016 shows no acute abnormalities other than hiatal hernia. Ultrasound abdomen 09/21/2016 shows no evidence of obstruction, no hydronephrosis, bilateral renal size roughly 9 cm added Reglan to medications--this seemed to help to some  extent Uremia might explain some symptoms?  Continue Zofran,Phenergan. Patient is able to tolerate the diet. Starting the patient on MiraLAX and Senokot.  4. GERD. Changing PPI to Pepcid  Bowel regimen: last BM prior to admission Diet: renal die DVT Prophylaxis: subcutaneous Heparin  Advance goals of care discussion: FULL CODE  Family Communication: no family was present at bedside, at the time of interview.    Disposition:  ? When will d/c--has to be determined if ESRD ofr not  Start     Dose/Rate Route Frequency Ordered Stop   09/22/16 0300  cefTRIAXone (ROCEPHIN) 1 g in dextrose 5 % 50 mL IVPB  Status:  Discontinued     1 g 100 mL/hr over 30 Minutes Intravenous Every 24 hours 09/22/16 0226 09/24/16 1559      Subjective:   Weak tired and not hungry but otherwise no specific fever chills pains  Objective: Physical Exam: Vitals:   09/24/16 2010 09/24/16 2056 09/25/16 0505 09/25/16 1000  BP: (!) 161/70  125/60 (!) 158/70  Pulse: 74  75 81  Resp: 19  20 20   Temp: 98.6 F (37 C)  98.3 F (36.8 C) 98.7 F (37.1 C)  TempSrc: Oral  Oral Oral  SpO2: 100%  97% 98%  Weight:  105.5 kg (232 lb 8 oz)    Height:        Intake/Output Summary (Last 24 hours) at 09/25/16 1333 Last data filed at 09/25/16 0900  Gross per 24 hour  Intake          2285.83 ml  Output             2350 ml  Net           -  64.17 ml   Filed Weights   09/22/16 0300 09/22/16 2300 09/24/16 2056  Weight: 106 kg (233 lb 11 oz) 106 kg (233 lb 11 oz) 105.5 kg (232 lb 8 oz)    General: Alert, Awake and Oriented x3 Eyes: PERRL, Conjunctiva normal, no ict ENT: Oral Mucosa clear moist. Neck: no JVD Cardiovascular: S1 and S2 Present, no Murmur Respiratory: Bilateral Air entry equal-Clear to Auscultation Abdomen: Bowel Sound present, Soft and no tenderness, no rebound no guarding Skin: no redness, no Rash, no induration Extremities: no Pedal edema, no calf tenderness Neurologic: Grossly no focal neuro  deficit.  Data Reviewed: CBC:  Recent Labs Lab 09/25/16 0844  LIPASE 32   No results for input(s): AMMONIA in the last 168 hours. Coagulation Profile: No results for input(s): INR, PROTIME in the last 168 hours. Cardiac Enzymes: No results for input(s): CKTOTAL, CKMB, CKMBINDEX, TROPONINI in the last 168 hours. BNP (last 3 results) No results for input(s): PROBNP in the last 8760 hours.  CBG: No results for input(s): GLUCAP in the last 168 hours.  Studies: Dg Chest 2 View  Result Date: 09/25/2016 CLINICAL DATA:  Cough.  Hypertension. EXAM: CHEST  2 VIEW COMPARISON:  September 22, 2016 FINDINGS: There is no edema or consolidation. Heart size and pulmonary vascular normal. No adenopathy. There is aortic atherosclerosis. No bone lesions. IMPRESSION: No edema or consolidation. The ill-defined opacity overlying the heart anteriorly on the lateral view is no longer evident. Stable cardiac silhouette. There is aortic atherosclerosis. Electronically Signed   By: Lowella Grip III M.D.   On: 09/25/2016 07:32     Scheduled Meds: . atorvastatin  40 mg Oral Daily  . citalopram  20 mg Oral Daily  . clopidogrel  75 mg Oral Daily  . famotidine  20 mg Oral Daily  . heparin  5,000 Units Subcutaneous Q8H  . levothyroxine  100 mcg Oral QAC breakfast  . metoCLOPramide  5 mg Oral TID AC  . polyethylene glycol  17 g Oral Daily  . senna-docusate  1 tablet Oral BID  . tamsulosin  0.4 mg Oral Daily   Continuous Infusions:  PRN Meds: ondansetron (ZOFRAN) IV, promethazine **OR** promethazine **OR** promethazine  Time spent: 15 minutes  Author: Verneita Griffes, MD [facility] (P) [phone]  If 7PM-7AM, please contact night-coverage at [email or web address], password [facility]

## 2016-09-30 NOTE — Care Management Important Message (Signed)
Important Message  Patient Details  Name: Rachael [name] MRN: [id number] Date of Birth: [birth year]   Medicare Important Message Given:  Yes    [name] [address] 09/30/2016, 12:03 PM

## 2016-10-01 LAB — RENAL FUNCTION PANEL
ALBUMIN: 3 g/dL — AB (ref 3.5–5.0)
Anion gap: 10 (ref 5–15)
BUN: 41 mg/dL — AB (ref 6–20)
CHLORIDE: 107 mmol/L (ref 101–111)
CO2: 20 mmol/L — ABNORMAL LOW (ref 22–32)
CREATININE: 6.27 mg/dL — AB (ref 0.44–1.00)
Calcium: 8.7 mg/dL — ABNORMAL LOW (ref 8.9–10.3)
GFR, EST AFRICAN AMERICAN: 7 mL/min — AB (ref >60)
GFR, EST NON AFRICAN AMERICAN: 6 mL/min — AB (ref >60)
Glucose, Bld: 99 mg/dL (ref 65–99)
PHOSPHORUS: 4.8 mg/dL — AB (ref 2.5–4.6)
POTASSIUM: 3.8 mmol/L (ref 3.5–5.1)
Sodium: 137 mmol/L (ref 135–145)

## 2016-10-01 LAB — CBC
HEMATOCRIT: 33.3 % — AB (ref 36.0–46.0)
Hemoglobin: 10.4 g/dL — ABNORMAL LOW (ref 12.0–15.0)
MCH: 24.9 pg — ABNORMAL LOW (ref 26.0–34.0)
MCHC: 31.2 g/dL (ref 30.0–36.0)
MCV: 79.9 fL (ref 78.0–100.0)
PLATELETS: 146 10*3/uL — AB (ref 150–400)
RBC: 4.17 MIL/uL (ref 3.87–5.11)
RDW: 17 % — AB (ref 11.5–15.5)
WBC: 7.2 10*3/uL (ref 4.0–10.5)

## 2016-10-01 MED ORDER — HEPARIN SODIUM (PORCINE) 5000 UNIT/ML IJ SOLN
5000.0000 [IU] | Freq: Three times a day (TID) | INTRAMUSCULAR | Status: DC
  Administered 2016-10-03 – 2016-10-14 (×33): 5000 [IU] via SUBCUTANEOUS
  Filled 2016-10-01 (×26): qty 1

## 2016-10-01 MED ORDER — ALPRAZOLAM 0.5 MG PO TABS
ORAL_TABLET | ORAL | Status: AC
  Administered 2016-10-01: 0.25 mg
  Filled 2016-10-01: qty 1

## 2016-10-01 MED ORDER — ALPRAZOLAM 0.25 MG PO TABS
0.2500 mg | ORAL_TABLET | Freq: Once | ORAL | Status: AC
  Administered 2016-10-01: 0.25 mg via ORAL

## 2016-10-01 NOTE — Consult Note (Signed)
            [facility] Primary Care Navigator  10/01/2016  Lorriann Hansmann Jepsen [birth year] [phone]   Went to see patient at the bedside to identify possible discharge needs but staff states that patient is in dialysis at the moment and would take about 3 hours for the procedure.  Will try another time to meet with patient when available.  For additional questions please contact:  Edwena Felty A. Flor Houdeshell, BSN, RN-BC [facility] PRIMARY CARE Navigator Cell: [phone]

## 2016-10-01 NOTE — Progress Notes (Signed)
Rachael [name] Progress Note    Subjective:    Getting HD today Needed alprazolam for anxiety No other c/o Anticipating diagnostic renal bx tomorrow    Objective:   BP 103/61   Pulse 92   Temp 98 F (36.7 C) (Oral)   Resp 14   Ht 5\' 2"  (1.575 m)   Wt 101.5 kg (223 lb 12.3 oz) Comment: Standing Weight  SpO2 96%   BMI 40.93 kg/m   Intake/Output Summary (Last 24 hours) at 10/01/16 0954 Last data filed at 10/01/16 0445  Gross per 24 hour  Intake              750 ml  Output             1425 ml  Net             -675 ml   Weight change: -4.5 kg (-9 lb 14.7 oz)  Physical Exam: On dialysis Mildly anxious No JVD Lungs clear anteriorly Regular, s1S2 No S3 Abd soft non tender LE's chubby.  No edema of LE's R IJ trialysis cath in place running at 250 No asterixus  Imaging: Ir Fluoro Guide Cv Line Right  Result Date: 09/29/2016 INDICATION: 61 year old female in need of hemodialysis access. EXAM: IR RIGHT FLOURO GUIDE CV LINE; IR ULTRASOUND GUIDANCE VASC ACCESS RIGHT MEDICATIONS: None ANESTHESIA/SEDATION: None FLUOROSCOPY TIME:  Fluoroscopy Time: 0 minutes 18 seconds (11 mGy). COMPLICATIONS: None immediate. PROCEDURE: Informed written consent was obtained from the patient after a thorough discussion of the procedural risks, benefits and alternatives. All questions were addressed. Maximal Sterile Barrier Technique was utilized including caps, mask, sterile gowns, sterile gloves, sterile drape, hand hygiene and skin antiseptic. A timeout was performed prior to the initiation of the procedure. The right internal jugular vein was interrogated with ultrasound and found to be widely patent. An image was obtained and stored for the medical record. Local anesthesia was attained by infiltration with 1% lidocaine. A small dermatotomy was made. Under real-time sonographic guidance, the vessel was punctured with an 18 gauge needle. An Amplatz wire was advanced into the right heart  and into the inferior vena cava. The needle was removed over the wire and a 20 cm Mahurkar non tunneled hemodialysis catheter with an additional central venous access port was advanced over the wire and position with the tip in the upper right atrium. The wire was removed. The catheter was secured to the skin with 0 Prolene suture. The catheter was found to work well and was flushed with heparinized saline. Sterile bandages were applied. The patient tolerated the procedure well. IMPRESSION: Successful placement of a 20 cm Mahurkar non tunneled hemodialysis catheter with additional central venous access port via a right internal jugular vein approach. The catheter tips are in the upper right atrium and ready for immediate use. Electronically Signed   By: Jacqulynn Cadet M.D.   On: 09/29/2016 15:30   Ir US Guide Vasc Access Right  Result Date: 09/29/2016 INDICATION: 61 year old female in need of hemodialysis access. EXAM: IR RIGHT FLOURO GUIDE CV LINE; IR ULTRASOUND GUIDANCE VASC ACCESS RIGHT MEDICATIONS: None ANESTHESIA/SEDATION: None FLUOROSCOPY TIME:  Fluoroscopy Time: 0 minutes 18 seconds (11 mGy). COMPLICATIONS: None immediate. PROCEDURE: Informed written consent was obtained from the patient after a thorough discussion of the procedural risks, benefits and alternatives. All questions were addressed. Maximal Sterile Barrier Technique was utilized including caps, mask, sterile gowns, sterile gloves, sterile drape, hand hygiene and skin antiseptic. A timeout was performed prior to  the initiation of the procedure. The right internal jugular vein was interrogated with ultrasound and found to be widely patent. An image was obtained and stored for the medical record. Local anesthesia was attained by infiltration with 1% lidocaine. A small dermatotomy was made. Under real-time sonographic guidance, the vessel was punctured with an 18 gauge needle. An Amplatz wire was advanced into the right heart and into the  inferior vena cava. The needle was removed over the wire and a 20 cm Mahurkar non tunneled hemodialysis catheter with an additional central venous access port was advanced over the wire and position with the tip in the upper right atrium. The wire was removed. The catheter was secured to the skin with 0 Prolene suture. The catheter was found to work well and was flushed with heparinized saline. Sterile bandages were applied. The patient tolerated the procedure well. IMPRESSION: Successful placement of a 20 cm Mahurkar non tunneled hemodialysis catheter with additional central venous access port via a right internal jugular vein approach. The catheter tips are in the upper right atrium and ready for immediate use. Electronically Signed   By: Jacqulynn Cadet M.D.   On: 09/29/2016 15:30    Recent Labs Lab 09/26/16 0748 09/26/16 1306 09/27/16 0512 09/28/16 0359 09/29/16 0659 09/29/16 1318 09/30/16 0523 10/01/16 0616  NA 145 143 142 140 138 139 136 137  K 3.0* 3.1* 3.3* 3.6 3.7 3.9 2.9* 3.8  CL 110 108 109 110 110 107 104 107  CO2 26 25 22  21* 18* 20* 21* 20*  GLUCOSE 95 103* 109* 90 102* 89 101* 99  BUN 28* 28* 31* 38* 43* 47* 28* 41*  CREATININE 6.07* 6.27* 6.52* 6.92* 7.20* 7.32* 4.52* 6.27*  CALCIUM 8.4* 8.6* 8.9 9.0 9.2 9.2 8.5* 8.7*  PHOS 4.0  --  3.9 4.3 4.8* 5.1* 2.9 4.8*   CBC  Recent Labs Lab 09/26/16 0748 09/27/16 0512 09/29/16 1318 10/01/16 0616  WBC 6.1 6.4 7.9 7.2  HGB 10.0* 10.3* 11.2* 10.4*  HCT 31.3* 32.8* 34.9* 33.3*  MCV 77.9* 79.0 79.1 79.9  PLT 147* 152 163 146*    Medications:    . citalopram  20 mg Oral Daily  . famotidine  20 mg Oral Daily  . heparin  5,000 Units Subcutaneous Q8H  . levothyroxine  100 mcg Oral QAC breakfast  . metoCLOPramide  5 mg Oral TID AC  . polyethylene glycol  17 g Oral Daily  . senna-docusate  1 tablet Oral BID  . tamsulosin  0.4 mg Oral Daily     Assessment/ Plan:    1. AKI on CKD III -  Initially thought dry,  non-resolved w/hydrationno offensive medicines. Proteinuria (UPC 1.5 gm) Glucose on UA but no h/o diabetes, A1C 5.6, ANA, ANCA negative; complements WNL, HIV neg, hepatitis B and C neg, SPEP unremarkable,  serum free light chains with ratio of 1.49.  Diagnosis obscure - for renal biopsy on 10/02/16. (stopped Plavix- safe to proceed with renal biopsy on 1/25 per IR).  Got temp HD cath and 1st HD 1/22, getting 2nd HD today. Creatinine increase 4.25->6.7 since last HD. 2. Hypokalemia - Pre HD K 3.8 after 40 po yesterday. Using 4K bath today. Check in AM 3. Chronic pancreatitis/nv - better with reglan 4. UTI- s/p CTX, urine culture negative--? AIN 5. H/o CVA: on Plavix, have stopped this in setting of plans for biopsy (off 1/20).   Rachael Maes, MD [name] [phone] Pager 10/01/2016, 9:54 AM

## 2016-10-01 NOTE — Progress Notes (Addendum)
[facility] PROGRESS NOTE  Rachael [name] XAJ:[id number] DOB: 08/15/1956 DOA: 09/22/2016  PCP: No primary care provider on file.  Brief History/Interval Summary: 61 year old female with a past medical history of chronic kidney disease, hypertension, hypothyroidism, presented with nausea, vomiting. Patient was found to have acute kidney injury. She was transferred to this facility from [facility]. Patient to be started on dialysis. Renal biopsy is planned for 1/25.  Reason for Visit: Acute kidney injury  Consultants: Nephrology  Procedures: Hemodialysis  Antibiotics: None  Subjective/Interval History: Patient seen at dialysis session. States that she is feeling better. She was a bit anxious earlier today. Was nauseous earlier today, but not currently.  ROS: Denies any chest pain or shortness of breath.  Objective:  Vital Signs  Vitals:   10/01/16 0900 10/01/16 0915 10/01/16 0929 10/01/16 1002  BP: (!) 86/49 (!) 115/58 103/61 121/61  Pulse: 86 87 92 92  Resp:    18  Temp:    97.1 F (36.2 C)  TempSrc:    Oral  SpO2:      Weight:    101.5 kg (223 lb 12.3 oz)  Height:        Intake/Output Summary (Last 24 hours) at 10/01/16 1443 Last data filed at 10/01/16 1002  Gross per 24 hour  Intake              480 ml  Output             1025 ml  Net             -545 ml   Filed Weights   09/30/16 0445 10/01/16 0700 10/01/16 1002  Weight: 106 kg (233 lb 11 oz) 101.5 kg (223 lb 12.3 oz) 101.5 kg (223 lb 12.3 oz)    General appearance: alert, cooperative, appears stated age and no distress Resp: clear to auscultation bilaterally Cardio: regular rate and rhythm, S1, S2 normal, no murmur, click, rub or gallop GI: soft, non-tender; bowel sounds normal; no masses,  no organomegaly Extremities: Minimal bilateral pedal edema Neurologic: No focal deficits  Lab Results:  Data Reviewed: I have personally reviewed following labs and imaging studies  CBC:  Recent  Labs Lab 09/25/16 0625 09/26/16 0748 09/27/16 0512 09/29/16 1318 10/01/16 0616  WBC 5.3 6.1 6.4 7.9 7.2  HGB 9.5* 10.0* 10.3* 11.2* 10.4*  HCT 29.2* 31.3* 32.8* 34.9* 33.3*  MCV 77.5* 77.9* 79.0 79.1 79.9  PLT 134* 147* 152 163 146*    Basic Metabolic Panel:  Recent Labs Lab 09/28/16 0359 09/29/16 0659 09/29/16 1318 09/30/16 0523 10/01/16 0616  NA 140 138 139 136 137  K 3.6 3.7 3.9 2.9* 3.8  CL 110 110 107 104 107  CO2 21* 18* 20* 21* 20*  GLUCOSE 90 102* 89 101* 99  BUN 38* 43* 47* 28* 41*  CREATININE 6.92* 7.20* 7.32* 4.52* 6.27*  CALCIUM 9.0 9.2 9.2 8.5* 8.7*  PHOS 4.3 4.8* 5.1* 2.9 4.8*    GFR: Estimated Creatinine Clearance: 10.4 mL/min (by C-G formula based on SCr of 6.27 mg/dL (H)).  Liver Function Tests:  Recent Labs Lab 09/26/16 1306  09/28/16 0359 09/29/16 0659 09/29/16 1318 09/30/16 0523 10/01/16 0616  AST 20  --   --   --   --   --   --   ALT 14  --   --   --   --   --   --   ALKPHOS 51  --   --   --   --   --   --  BILITOT 0.4  --   --   --   --   --   --   PROT 6.2*  --   --   --   --   --   --   ALBUMIN 2.8*  < > 2.9* 3.0* 3.0* 3.0* 3.0*  < > = values in this interval not displayed.   Recent Labs Lab 09/25/16 0844  LIPASE 32    Recent Results (from the past 240 hour(s))  Culture, Urine     Status: None   Collection Time: 09/22/16  6:02 PM  Result Value Ref Range Status   Specimen Description URINE, RANDOM  Final   Special Requests NONE  Final   Culture NO GROWTH  Final   Report Status 09/23/2016 FINAL  Final      Radiology Studies: Ir Fluoro Guide Cv Line Right  Result Date: 09/29/2016 INDICATION: 61 year old female in need of hemodialysis access. EXAM: IR RIGHT FLOURO GUIDE CV LINE; IR ULTRASOUND GUIDANCE VASC ACCESS RIGHT MEDICATIONS: None ANESTHESIA/SEDATION: None FLUOROSCOPY TIME:  Fluoroscopy Time: 0 minutes 18 seconds (11 mGy). COMPLICATIONS: None immediate. PROCEDURE: Informed written consent was obtained from the  patient after a thorough discussion of the procedural risks, benefits and alternatives. All questions were addressed. Maximal Sterile Barrier Technique was utilized including caps, mask, sterile gowns, sterile gloves, sterile drape, hand hygiene and skin antiseptic. A timeout was performed prior to the initiation of the procedure. The right internal jugular vein was interrogated with ultrasound and found to be widely patent. An image was obtained and stored for the medical record. Local anesthesia was attained by infiltration with 1% lidocaine. A small dermatotomy was made. Under real-time sonographic guidance, the vessel was punctured with an 18 gauge needle. An Amplatz wire was advanced into the right heart and into the inferior vena cava. The needle was removed over the wire and a 20 cm Mahurkar non tunneled hemodialysis catheter with an additional central venous access port was advanced over the wire and position with the tip in the upper right atrium. The wire was removed. The catheter was secured to the skin with 0 Prolene suture. The catheter was found to work well and was flushed with heparinized saline. Sterile bandages were applied. The patient tolerated the procedure well. IMPRESSION: Successful placement of a 20 cm Mahurkar non tunneled hemodialysis catheter with additional central venous access port via a right internal jugular vein approach. The catheter tips are in the upper right atrium and ready for immediate use. Electronically Signed   By: Jacqulynn Cadet M.D.   On: 09/29/2016 15:30   Ir US Guide Vasc Access Right  Result Date: 09/29/2016 INDICATION: 61 year old female in need of hemodialysis access. EXAM: IR RIGHT FLOURO GUIDE CV LINE; IR ULTRASOUND GUIDANCE VASC ACCESS RIGHT MEDICATIONS: None ANESTHESIA/SEDATION: None FLUOROSCOPY TIME:  Fluoroscopy Time: 0 minutes 18 seconds (11 mGy). COMPLICATIONS: None immediate. PROCEDURE: Informed written consent was obtained from the patient after a  thorough discussion of the procedural risks, benefits and alternatives. All questions were addressed. Maximal Sterile Barrier Technique was utilized including caps, mask, sterile gowns, sterile gloves, sterile drape, hand hygiene and skin antiseptic. A timeout was performed prior to the initiation of the procedure. The right internal jugular vein was interrogated with ultrasound and found to be widely patent. An image was obtained and stored for the medical record. Local anesthesia was attained by infiltration with 1% lidocaine. A small dermatotomy was made. Under real-time sonographic guidance, the vessel was punctured with an 18  gauge needle. An Amplatz wire was advanced into the right heart and into the inferior vena cava. The needle was removed over the wire and a 20 cm Mahurkar non tunneled hemodialysis catheter with an additional central venous access port was advanced over the wire and position with the tip in the upper right atrium. The wire was removed. The catheter was secured to the skin with 0 Prolene suture. The catheter was found to work well and was flushed with heparinized saline. Sterile bandages were applied. The patient tolerated the procedure well. IMPRESSION: Successful placement of a 20 cm Mahurkar non tunneled hemodialysis catheter with additional central venous access port via a right internal jugular vein approach. The catheter tips are in the upper right atrium and ready for immediate use. Electronically Signed   By: Jacqulynn Cadet M.D.   On: 09/29/2016 15:30     Medications:  Scheduled: . citalopram  20 mg Oral Daily  . famotidine  20 mg Oral Daily  . [START ON 10/03/2016] heparin  5,000 Units Subcutaneous Q8H  . levothyroxine  100 mcg Oral QAC breakfast  . metoCLOPramide  5 mg Oral TID AC  . polyethylene glycol  17 g Oral Daily  . senna-docusate  1 tablet Oral BID  . tamsulosin  0.4 mg Oral Daily   Continuous:  HQI:ONGEXB chloride, sodium chloride, alteplase, heparin,  lidocaine (PF), lidocaine (PF), lidocaine-prilocaine, ondansetron (ZOFRAN) IV, pentafluoroprop-tetrafluoroeth, promethazine **OR** promethazine **OR** promethazine  Assessment/Plan:  Principal Problem:   Acute renal failure superimposed on chronic kidney disease ([place]) Active Problems:   UTI (urinary tract infection)   Nausea and vomiting   HTN (hypertension)   Hypothyroidism   AKI (acute kidney injury) ([place])    Acute renal failure superimposed on chronic kidney disease Patient had chronic kidney disease stage III. Presented with acute renal failure. Etiology is unclear so far. Nephrology is following. Plan is for renal biopsy tomorrow. Urine cultures were negative so IV ceftriaxone was discontinued. Patient has been off of Plavix since 1/20. Dialysis per nephrology.    Hypothyroidism. Continue Synthroid 100 mcg daily  Recurrent nausea and vomiting. Constipation CT abdomen in other facility 09/19/2016 shows no acute abnormalities other than hiatal hernia. Ultrasound abdomen 09/21/2016 shows no evidence of obstruction, no hydronephrosis, bilateral renal size roughly 9 cm. Added Reglan to medications--this seemed to help to some extent. Uremia might explain some symptoms?  History of stroke Stable. Plavix on hold for renal biopsy.  Normocytic anemia Hemoglobin is stable. Likely due to anemia of chronic disease.  GERD. Stable  DVT Prophylaxis: SubCutaneous heparin Code Status: Full code  Family Communication: Discussed with patient  Disposition Plan: Management as outlined above. Mobilize.    LOS: 9 days   [place] [facility] Pager [phone] 10/01/2016, 2:43 PM  If 7PM-7AM, please contact night-coverage at [email or web address], password [facility]

## 2016-10-01 NOTE — Procedures (Signed)
I have personally attended this patient's dialysis session.    No vol off, 4K bath for K 3.8 (after 40 po replacement yesterday) R IJ temp cath (placed IR 1/22) running at 250 Pt anxious - given alprazolam 0.25 X2  Jamal Maes, MD [facility] [phone] Pager 10/01/2016, 10:03 AM   \

## 2016-10-02 ENCOUNTER — Inpatient Hospital Stay (HOSPITAL_COMMUNITY): Payer: Medicare HMO

## 2016-10-02 LAB — RENAL FUNCTION PANEL
ANION GAP: 9 (ref 5–15)
Albumin: 2.9 g/dL — ABNORMAL LOW (ref 3.5–5.0)
BUN: 28 mg/dL — ABNORMAL HIGH (ref 6–20)
CO2: 25 mmol/L (ref 22–32)
Calcium: 8.5 mg/dL — ABNORMAL LOW (ref 8.9–10.3)
Chloride: 103 mmol/L (ref 101–111)
Creatinine, Ser: 4.47 mg/dL — ABNORMAL HIGH (ref 0.44–1.00)
GFR calc Af Amer: 11 mL/min — ABNORMAL LOW (ref >60)
GFR calc non Af Amer: 10 mL/min — ABNORMAL LOW (ref >60)
GLUCOSE: 102 mg/dL — AB (ref 65–99)
POTASSIUM: 3.7 mmol/L (ref 3.5–5.1)
Phosphorus: 4.2 mg/dL (ref 2.5–4.6)
SODIUM: 137 mmol/L (ref 135–145)

## 2016-10-02 LAB — PROTIME-INR
INR: 1.01
Prothrombin Time: 13.3 seconds (ref 11.4–15.2)

## 2016-10-02 LAB — CBC
HCT: 33 % — ABNORMAL LOW (ref 36.0–46.0)
Hemoglobin: 10.4 g/dL — ABNORMAL LOW (ref 12.0–15.0)
MCH: 25.2 pg — AB (ref 26.0–34.0)
MCHC: 31.5 g/dL (ref 30.0–36.0)
MCV: 79.9 fL (ref 78.0–100.0)
PLATELETS: 128 10*3/uL — AB (ref 150–400)
RBC: 4.13 MIL/uL (ref 3.87–5.11)
RDW: 16.8 % — AB (ref 11.5–15.5)
WBC: 7.1 10*3/uL (ref 4.0–10.5)

## 2016-10-02 LAB — APTT: APTT: 28 s (ref 24–36)

## 2016-10-02 MED ORDER — FENTANYL CITRATE (PF) 100 MCG/2ML IJ SOLN
INTRAMUSCULAR | Status: AC | PRN
  Administered 2016-10-02 (×3): 25 ug via INTRAVENOUS

## 2016-10-02 MED ORDER — FENTANYL CITRATE (PF) 100 MCG/2ML IJ SOLN
INTRAMUSCULAR | Status: AC
  Filled 2016-10-02: qty 2

## 2016-10-02 MED ORDER — MIDAZOLAM HCL 2 MG/2ML IJ SOLN
INTRAMUSCULAR | Status: AC
  Filled 2016-10-02: qty 2

## 2016-10-02 MED ORDER — MIDAZOLAM HCL 2 MG/2ML IJ SOLN
INTRAMUSCULAR | Status: AC | PRN
  Administered 2016-10-02 (×2): 1 mg via INTRAVENOUS

## 2016-10-02 MED ORDER — LIDOCAINE HCL (PF) 1 % IJ SOLN
INTRAMUSCULAR | Status: AC
  Filled 2016-10-02: qty 10

## 2016-10-02 NOTE — Progress Notes (Signed)
[facility] PROGRESS NOTE  Rachael [name] PYP:[id number] DOB: 09/25/1955 DOA: 09/22/2016  PCP: No primary care provider on file.  Brief History/Interval Summary: 61 year old female with a past medical history of chronic kidney disease, hypertension, hypothyroidism, presented with nausea, vomiting. Patient was found to have acute kidney injury. She was transferred to this facility from [facility]. Patient to be started on dialysis. Renal biopsy is planned for 1/25.  Reason for Visit: Acute kidney injury  Consultants: Nephrology  Procedures:  Hemodialysis  Renal biopsy 1/25  Antibiotics: None  Subjective/Interval History: Patient seen after her renal biopsy. She mentioned that she had an episode of vomiting after supper last night. Denies any abdominal pain. No nausea, vomiting this morning.   ROS: Denies any chest pain or shortness of breath.  Objective:  Vital Signs  Vitals:   10/02/16 1010 10/02/16 1015 10/02/16 1032 10/02/16 1049  BP: (!) 87/55 96/64 100/60 118/67  Pulse: 69 68 68 79  Resp: 15 14    Temp:      TempSrc:      SpO2: 100% 100% 92%   Weight:      Height:        Intake/Output Summary (Last 24 hours) at 10/02/16 1106 Last data filed at 10/02/16 0600  Gross per 24 hour  Intake              600 ml  Output             1925 ml  Net            -1325 ml   Filed Weights   09/30/16 0445 10/01/16 0700 10/01/16 1002  Weight: 106 kg (233 lb 11 oz) 101.5 kg (223 lb 12.3 oz) 101.5 kg (223 lb 12.3 oz)    General appearance: alert, cooperative, appears stated age and no distress Resp: clear to auscultation bilaterally Cardio: regular rate and rhythm, S1, S2 normal, no murmur, click, rub or gallop GI: Abdomen could not be examined today as she had to lie in a pronated position due to her biopsy Extremities: Minimal bilateral pedal edema Neurologic: No focal deficits  Lab Results:  Data Reviewed: I have personally reviewed following labs and  imaging studies  CBC:  Recent Labs Lab 09/26/16 0748 09/27/16 0512 09/29/16 1318 10/01/16 0616  WBC 6.1 6.4 7.9 7.2  HGB 10.0* 10.3* 11.2* 10.4*  HCT 31.3* 32.8* 34.9* 33.3*  MCV 77.9* 79.0 79.1 79.9  PLT 147* 152 163 146*    Basic Metabolic Panel:  Recent Labs Lab 09/29/16 0659 09/29/16 1318 09/30/16 0523 10/01/16 0616 10/02/16 0443  NA 138 139 136 137 137  K 3.7 3.9 2.9* 3.8 3.7  CL 110 107 104 107 103  CO2 18* 20* 21* 20* 25  GLUCOSE 102* 89 101* 99 102*  BUN 43* 47* 28* 41* 28*  CREATININE 7.20* 7.32* 4.52* 6.27* 4.47*  CALCIUM 9.2 9.2 8.5* 8.7* 8.5*  PHOS 4.8* 5.1* 2.9 4.8* 4.2    GFR: Estimated Creatinine Clearance: 14.6 mL/min (by C-G formula based on SCr of 4.47 mg/dL (H)).  Liver Function Tests:  Recent Labs Lab 09/26/16 1306  09/29/16 0659 09/29/16 1318 09/30/16 0523 10/01/16 0616 10/02/16 0443  AST 20  --   --   --   --   --   --   ALT 14  --   --   --   --   --   --   ALKPHOS 51  --   --   --   --   --   --  BILITOT 0.4  --   --   --   --   --   --   PROT 6.2*  --   --   --   --   --   --   ALBUMIN 2.8*  < > 3.0* 3.0* 3.0* 3.0* 2.9*  < > = values in this interval not displayed.  No results for input(s): LIPASE, AMYLASE in the last 168 hours.  Recent Results (from the past 240 hour(s))  Culture, Urine     Status: None   Collection Time: 09/22/16  6:02 PM  Result Value Ref Range Status   Specimen Description URINE, RANDOM  Final   Special Requests NONE  Final   Culture NO GROWTH  Final   Report Status 09/23/2016 FINAL  Final      Radiology Studies: No results found.   Medications:  Scheduled: . citalopram  20 mg Oral Daily  . famotidine  20 mg Oral Daily  . fentaNYL      . [START ON 10/03/2016] heparin  5,000 Units Subcutaneous Q8H  . levothyroxine  100 mcg Oral QAC breakfast  . lidocaine (PF)      . metoCLOPramide  5 mg Oral TID AC  . midazolam      . polyethylene glycol  17 g Oral Daily  . senna-docusate  1 tablet Oral  BID  . tamsulosin  0.4 mg Oral Daily   Continuous:  XTA:VWPVXY chloride, sodium chloride, alteplase, heparin, lidocaine (PF), lidocaine-prilocaine, ondansetron (ZOFRAN) IV, pentafluoroprop-tetrafluoroeth, promethazine **OR** promethazine **OR** promethazine  Assessment/Plan:  Principal Problem:   Acute renal failure superimposed on chronic kidney disease ([place]) Active Problems:   UTI (urinary tract infection)   Nausea and vomiting   HTN (hypertension)   Hypothyroidism   AKI (acute kidney injury) ([place])    Acute renal failure superimposed on chronic kidney disease Patient had chronic kidney disease stage III. Presented with acute renal failure. Etiology is unclear so far. Nephrology is following. Patient underwent renal biopsy today. Urine cultures were negative so IV ceftriaxone was discontinued. Patient has been off of Plavix since 1/20. Dialysis per nephrology.    Hypothyroidism. Continue Synthroid 100 mcg daily  Recurrent nausea and vomiting. Constipation CT abdomen in other facility 09/19/2016 shows no acute abnormalities other than hiatal hernia. Ultrasound abdomen 09/21/2016 shows no evidence of obstruction, no hydronephrosis, bilateral renal size roughly 9 cm. , continues to have occasional vomiting despite Reglan. Treat symptomatically for now. Renal failure and dialysis could be contributing.  History of stroke Stable. Plavix on hold for renal biopsy.  Normocytic anemia Hemoglobin is stable. Likely due to anemia of chronic disease.  GERD. Stable  DVT Prophylaxis: SubCutaneous heparin on hold for biopsy. Code Status: Full code  Family Communication: Discussed with patient  Disposition Plan: Management as outlined above. Mobilize.    LOS: 10 days   [place] [facility] Pager [phone] 10/02/2016, 11:06 AM  If 7PM-7AM, please contact night-coverage at [email or web address], password [facility]

## 2016-10-02 NOTE — Progress Notes (Signed)
  [facility] Progress Note    Subjective:    Had HD 1/24 S/p renal biopsy today   Objective:   BP 118/67   Pulse 79   Temp 99.3 F (37.4 C) (Oral)   Resp 14   Ht 5\' 2"  (1.575 m)   Wt 101.5 kg (223 lb 12.3 oz)   SpO2 92%   BMI 40.93 kg/m   Intake/Output Summary (Last 24 hours) at 10/02/16 1113 Last data filed at 10/02/16 0600  Gross per 24 hour  Intake              600 ml  Output             1925 ml  Net            -1325 ml   Weight change: 0 kg (0 lb)  Physical Exam: Resting comfortable post bx without back pain or hematuria No JVD Lungs clear anteriorly Regular, s1S2 No S3 Abd soft non tender LE's chubby.  No edema of LE's R IJ trialysis cathwith dressing  Recent Labs Lab 09/27/16 0512 09/28/16 0359 09/29/16 0659 09/29/16 1318 09/30/16 0523 10/01/16 0616 10/02/16 0443  NA 142 140 138 139 136 137 137  K 3.3* 3.6 3.7 3.9 2.9* 3.8 3.7  CL 109 110 110 107 104 107 103  CO2 22 21* 18* 20* 21* 20* 25  GLUCOSE 109* 90 102* 89 101* 99 102*  BUN 31* 38* 43* 47* 28* 41* 28*  CREATININE 6.52* 6.92* 7.20* 7.32* 4.52* 6.27* 4.47*  CALCIUM 8.9 9.0 9.2 9.2 8.5* 8.7* 8.5*  PHOS 3.9 4.3 4.8* 5.1* 2.9 4.8* 4.2    Recent Labs Lab 09/26/16 0748 09/27/16 0512 09/29/16 1318 10/01/16 0616  WBC 6.1 6.4 7.9 7.2  HGB 10.0* 10.3* 11.2* 10.4*  HCT 31.3* 32.8* 34.9* 33.3*  MCV 77.9* 79.0 79.1 79.9  PLT 147* 152 163 146*    Medications:    . citalopram  20 mg Oral Daily  . famotidine  20 mg Oral Daily  . fentaNYL      . [START ON 10/03/2016] heparin  5,000 Units Subcutaneous Q8H  . levothyroxine  100 mcg Oral QAC breakfast  . lidocaine (PF)      . metoCLOPramide  5 mg Oral TID AC  . midazolam      . polyethylene glycol  17 g Oral Daily  . senna-docusate  1 tablet Oral BID  . tamsulosin  0.4 mg Oral Daily     Assessment/ Plan:    1. AKI on CKD III -  Initially thought dry, non-resolved w/hydration. No offensive medicines. Proteinuria (UPC  1.5 gm) Glucose on UA but no h/o diabetes, A1C 5.6, ANA, ANCA negative; complements WNL, HIV neg, hepatitis B and C neg, SPEP unremarkable, serum free light chains with ratio of 1.49.  Got temp HD cath and 1st HD 1/22, 2nd on 1/24. No HD indication today.  s/p renal biopsy today. Hopeful for prelim results maybe by Friday.  2. Hypokalemia - have been using 4K bath and repleting. Today 3.7. 3. Chronic pancreatitis/nv - better with reglan 4. UTI- s/p CTX, urine culture negative--? AIN 5. H/o CVA: on Plavix, have stopped this in setting of renal biopsy (off since 1/20).  Jamal Maes, MD [place] Pager 10/02/2016, 11:13 AM

## 2016-10-02 NOTE — Progress Notes (Signed)
Patient denies any pain or any discomfort.  No signs and symptoms of dizziness noted.  Encouraged her to notify the staff with pain or any kind of discomfort, verbalized understanding.

## 2016-10-02 NOTE — Sedation Documentation (Signed)
Patient IV leaking towards end of procedure. Attempting new IV.

## 2016-10-02 NOTE — Progress Notes (Signed)
Patient wanted to use the bathroom, insisted to use bed pan and tried to convince but patient refused and wanted to use bathroom for BM, (she is a low fall risk, independent,) ambulated good till the bathroom and she gave up, assisted her to the floor, couldn't hold her back as she is heavy.  Called help and used hoyer lift to put her back in the bed, checked VS BP 128/64, Po2 98%, P 76.  Patient said she blacked out for a second.  Notified Dr. Bonnielee Haff, via [place].  Put new fall risk band and also on bed alarm.  Patient apologized for not listening and said wouldn't do it again.  Patient denies any pain or any dizziness while in bed currently.  VS 128/64, P 76, R 18, PO2 98 RA, 98.2 temp.  Will continue to monitor.

## 2016-10-02 NOTE — Sedation Documentation (Signed)
Patient is resting comfortably. 

## 2016-10-02 NOTE — Procedures (Signed)
Interventional Radiology Procedure Note  Procedure: US guided core biopsy of right kidney  Complications: None  Estimated Blood Loss: < 10 mL  16 G core biopsy x 2 of LP cortex of right kidney.  Venetia Night. Kathlene Cote, M.D Pager:  [phone]

## 2016-10-03 LAB — CBC
HCT: 31.8 % — ABNORMAL LOW (ref 36.0–46.0)
Hemoglobin: 10.1 g/dL — ABNORMAL LOW (ref 12.0–15.0)
MCH: 25.5 pg — AB (ref 26.0–34.0)
MCHC: 31.8 g/dL (ref 30.0–36.0)
MCV: 80.3 fL (ref 78.0–100.0)
PLATELETS: 128 10*3/uL — AB (ref 150–400)
RBC: 3.96 MIL/uL (ref 3.87–5.11)
RDW: 17.1 % — AB (ref 11.5–15.5)
WBC: 7.5 10*3/uL (ref 4.0–10.5)

## 2016-10-03 LAB — RENAL FUNCTION PANEL
Albumin: 2.8 g/dL — ABNORMAL LOW (ref 3.5–5.0)
Anion gap: 10 (ref 5–15)
BUN: 38 mg/dL — AB (ref 6–20)
CHLORIDE: 105 mmol/L (ref 101–111)
CO2: 23 mmol/L (ref 22–32)
CREATININE: 6.12 mg/dL — AB (ref 0.44–1.00)
Calcium: 8.7 mg/dL — ABNORMAL LOW (ref 8.9–10.3)
GFR calc Af Amer: 8 mL/min — ABNORMAL LOW (ref >60)
GFR, EST NON AFRICAN AMERICAN: 7 mL/min — AB (ref >60)
Glucose, Bld: 101 mg/dL — ABNORMAL HIGH (ref 65–99)
Phosphorus: 4.9 mg/dL — ABNORMAL HIGH (ref 2.5–4.6)
Potassium: 3.5 mmol/L (ref 3.5–5.1)
Sodium: 138 mmol/L (ref 135–145)

## 2016-10-03 MED ORDER — CLOPIDOGREL BISULFATE 75 MG PO TABS
75.0000 mg | ORAL_TABLET | Freq: Every day | ORAL | Status: DC
  Administered 2016-10-03 – 2016-10-14 (×12): 75 mg via ORAL
  Filled 2016-10-03 (×12): qty 1

## 2016-10-03 MED ORDER — METOCLOPRAMIDE HCL 10 MG PO TABS
10.0000 mg | ORAL_TABLET | Freq: Three times a day (TID) | ORAL | Status: DC
  Administered 2016-10-03 – 2016-10-14 (×33): 10 mg via ORAL
  Filled 2016-10-03 (×31): qty 1
  Filled 2016-10-03: qty 2
  Filled 2016-10-03: qty 1

## 2016-10-03 NOTE — Progress Notes (Signed)
[facility] PROGRESS NOTE  Rachael [name] IDP:[id number] DOB: [birth year] DOA: 09/22/2016  PCP: No primary care provider on file.  Brief History/Interval Summary: 61 year old female with a past medical history of chronic kidney disease, hypertension, hypothyroidism, presented with nausea, vomiting. Patient was found to have acute kidney injury. She was transferred to this facility from [facility]. Patient to be started on dialysis. She underwent Renal biopsy 1/25.  Reason for Visit: Acute kidney injury  Consultants: Nephrology  Procedures:  Hemodialysis  Renal biopsy 1/25  Antibiotics: None  Subjective/Interval History: Patient had another episode of vomiting after supper yesterday. Denies any abdominal pain.   ROS: Denies any chest pain or shortness of breath.  Objective:  Vital Signs  Vitals:   10/02/16 1540 10/02/16 1659 10/02/16 2144 10/03/16 0629  BP: 118/62 100/60 (!) 118/57 123/65  Pulse: 78 75 86 83  Resp: 18 18 18 18   Temp:  98.9 F (37.2 C) 98.9 F (37.2 C) 98.7 F (37.1 C)  TempSrc:  Oral Oral Oral  SpO2: 96% 98% 98% 96%  Weight:   101.8 kg (224 lb 6.9 oz)   Height:        Intake/Output Summary (Last 24 hours) at 10/03/16 0917 Last data filed at 10/03/16 0600  Gross per 24 hour  Intake              840 ml  Output             1525 ml  Net             -685 ml   Filed Weights   10/01/16 0700 10/01/16 1002 10/02/16 2144  Weight: 101.5 kg (223 lb 12.3 oz) 101.5 kg (223 lb 12.3 oz) 101.8 kg (224 lb 6.9 oz)    General appearance: alert, cooperative, appears stated age and no distress Resp: clear to auscultation bilaterally Cardio: regular rate and rhythm, S1, S2 normal, no murmur, click, rub or gallop GI: Abdomen is soft. Nontender. Nondistended. No masses or organomegaly. Bowel sounds are present.  Extremities: Minimal bilateral pedal edema Neurologic: No focal deficits  Lab Results:  Data Reviewed: I have personally reviewed  following labs and imaging studies  CBC:  Recent Labs Lab 09/27/16 0512 09/29/16 1318 10/01/16 0616 10/02/16 1516 10/03/16 0507  WBC 6.4 7.9 7.2 7.1 7.5  HGB 10.3* 11.2* 10.4* 10.4* 10.1*  HCT 32.8* 34.9* 33.3* 33.0* 31.8*  MCV 79.0 79.1 79.9 79.9 80.3  PLT 152 163 146* 128* 128*    Basic Metabolic Panel:  Recent Labs Lab 09/29/16 1318 09/30/16 0523 10/01/16 0616 10/02/16 0443 10/03/16 0507  NA 139 136 137 137 138  K 3.9 2.9* 3.8 3.7 3.5  CL 107 104 107 103 105  CO2 20* 21* 20* 25 23  GLUCOSE 89 101* 99 102* 101*  BUN 47* 28* 41* 28* 38*  CREATININE 7.32* 4.52* 6.27* 4.47* 6.12*  CALCIUM 9.2 8.5* 8.7* 8.5* 8.7*  PHOS 5.1* 2.9 4.8* 4.2 4.9*    GFR: Estimated Creatinine Clearance: 10.7 mL/min (by C-G formula based on SCr of 6.12 mg/dL (H)).  Liver Function Tests:  Recent Labs Lab 09/26/16 1306  09/29/16 1318 09/30/16 0523 10/01/16 0616 10/02/16 0443 10/03/16 0507  AST 20  --   --   --   --   --   --   ALT 14  --   --   --   --   --   --   ALKPHOS 51  --   --   --   --   --   --  BILITOT 0.4  --   --   --   --   --   --   PROT 6.2*  --   --   --   --   --   --   ALBUMIN 2.8*  < > 3.0* 3.0* 3.0* 2.9* 2.8*  < > = values in this interval not displayed.  No results for input(s): LIPASE, AMYLASE in the last 168 hours.  No results found for this or any previous visit (from the past 240 hour(s)).    Radiology Studies: US Biopsy  Result Date: 10-27-16 CLINICAL DATA:  Acute on chronic kidney disease and need for random renal biopsy. EXAM: ULTRASOUND GUIDED CORE BIOPSY OF RIGHT KIDNEY MEDICATIONS: 2.0 mg IV Versed; 75 mcg IV Fentanyl Total Moderate Sedation Time: 15 minutes. The patient's level of consciousness and physiologic status were continuously monitored during the procedure by Radiology nursing. PROCEDURE: The procedure, risks, benefits, and alternatives were explained to the patient. Questions regarding the procedure were encouraged and answered. The  patient understands and consents to the procedure. A time out was performed prior to initiating the procedure. The right flank region was prepped with chlorhexidine in a sterile fashion, and a sterile drape was applied covering the operative field. A sterile gown and sterile gloves were used for the procedure. Local anesthesia was provided with 1% Lidocaine. Initial ultrasound was used to inspect both kidneys. The right was chosen for biopsy. Under ultrasound guidance, 2 separate 16 gauge core biopsy passes were made into lower pole cortex. Material was submitted in saline. Post biopsy imaging was performed with ultrasound. COMPLICATIONS: None. FINDINGS: Visualization of the right kidney was better by ultrasound. The left kidney also demonstrated more significant lower pole cortical thinning compared to the right. Solid tissue was obtained with biopsy. IMPRESSION: Ultrasound-guided core biopsy performed of the right kidney at the level of lower pole cortex. Electronically Signed   By: Aletta Edouard M.D.   On: 10-27-2016 11:59     Medications:  Scheduled: . citalopram  20 mg Oral Daily  . famotidine  20 mg Oral Daily  . heparin  5,000 Units Subcutaneous Q8H  . levothyroxine  100 mcg Oral QAC breakfast  . metoCLOPramide  10 mg Oral TID AC  . polyethylene glycol  17 g Oral Daily  . senna-docusate  1 tablet Oral BID  . tamsulosin  0.4 mg Oral Daily   Continuous:  FGH:WEXHBZ chloride, sodium chloride, alteplase, heparin, lidocaine (PF), lidocaine-prilocaine, ondansetron (ZOFRAN) IV, pentafluoroprop-tetrafluoroeth, promethazine **OR** promethazine **OR** promethazine  Assessment/Plan:  Principal Problem:   Acute renal failure superimposed on chronic kidney disease ([place]) Active Problems:   UTI (urinary tract infection)   Nausea and vomiting   HTN (hypertension)   Hypothyroidism   AKI (acute kidney injury) ([place])    Acute renal failure superimposed on chronic kidney disease Patient had  chronic kidney disease stage III. Presented with acute renal failure. Etiology is unclear so far. Nephrology is following. Patient underwent renal biopsy 2022-10-27. She was briefly on IV antibiotics. Cultures are negative. Patient has been off of Plavix since 1/20. Dialysis per nephrology.    Hypothyroidism. Continue Synthroid 100 mcg daily  Recurrent nausea and vomiting CT abdomen in other facility 09/19/2016 shows no acute abnormalities other than hiatal hernia. Ultrasound abdomen 09/21/2016 shows no evidence of obstruction, no hydronephrosis, bilateral renal size roughly 9 cm. , continues to have occasional vomiting despite Reglan. Unable to access the other ultrasound report. Will do ultrasound here to look at the  gallbladder. Treat symptomatically for now. Renal failure and dialysis could be contributing.  History of stroke Stable. Plavix was placed on hold for renal biopsy. We'll need to determine when this can be resumed.  Normocytic anemia Hemoglobin is stable. Likely due to anemia of chronic disease.  GERD. Stable  DVT Prophylaxis: SubCutaneous heparin on hold for biopsy. Code Status: Full code  Family Communication: Discussed with patient  Disposition Plan: Management as outlined above. Mobilize. PT evaluation.    LOS: 11 days   [facility] Pager [phone] 10/03/2016, 9:17 AM  If 7PM-7AM, please contact night-coverage at [email or web address], password [facility]

## 2016-10-03 NOTE — Evaluation (Signed)
Occupational Therapy Evaluation Patient Details Name: Rachael [name] MRN: [id number] DOB: May 24, 1956 Today's Date: 10/03/2016    History of Present Illness  61 year old female with a past medical history of chronic kidney disease, hypertension, hypothyroidism, presented with nausea, vomiting. Patient was found to have acute kidney injury. She was transferred to this facility from [facility]. Patient to be started on dialysis. She underwent Renal biopsy 1/25   Clinical Impression   PT admitted with renal biopsy with nausea. Pt currently with functional limitiations due to the deficits listed below (see OT problem list). PTA independent with all adls and iadls.  Pt will benefit from skilled OT to increase their independence and safety with adls and balance to allow discharge home . Pt BP obtained during session once symptomatic  Sitting 92/59 then supine in bed 105/67 (77). Pt with delayed response to symptoms and therapist noticing visual changes in patient prior to patient verbalizing to therapist. Pt closing eyes initially. Pt with delayed responses to therapist having say ABCs and needing cues to continue ABCs. Pt pausing but able to start again with cue. Pt until orthostatic appears to able at baseline with adls. OT to attempt another session to ensure balance with task mod I     Follow Up Recommendations  No OT follow up    Equipment Recommendations  None recommended by OT    Recommendations for Other Services       Precautions / Restrictions Precautions Precautions: Fall Precaution Comments: watch BP Restrictions Weight Bearing Restrictions: No      Mobility Bed Mobility Overal bed mobility: Independent                Transfers Overall transfer level: Modified independent                    Balance                                            ADL Overall ADL's : Needs assistance/impaired Eating/Feeding: Independent   Grooming:  Wash/dry hands;Brushing hair;Modified independent               Lower Body Dressing: Modified independent Lower Body Dressing Details (indicate cue type and reason): don socks Toilet Transfer: Minimal assistance           Functional mobility during ADLs: Minimal assistance General ADL Comments: Pt progressed from EOB sitting don socks to sink level. pt suddenly at sink level said "oh" pt responds yes to "are you dizzy" pt assisted to chair sitting with toe tapping. pt with BP obtained. pt with incr dizziness and (A)ed total+2 mod (A) to bed to ensure safety     Vision     Perception     Praxis      Pertinent Vitals/Pain Pain Assessment: No/denies pain     Hand Dominance Right   Extremity/Trunk Assessment Upper Extremity Assessment Upper Extremity Assessment: Overall WFL for tasks assessed   Lower Extremity Assessment Lower Extremity Assessment: RLE deficits/detail;LLE deficits/detail RLE Deficits / Details: edema noted LLE Deficits / Details: edema noted   Cervical / Trunk Assessment Cervical / Trunk Assessment: Normal   Communication Communication Communication: No difficulties   Cognition Arousal/Alertness: Awake/alert Behavior During Therapy: WFL for tasks assessed/performed Overall Cognitive Status: Within Functional Limits for tasks assessed  General Comments       Exercises       Shoulder Instructions      Home Living Family/patient expects to be discharged to:: Private residence Living Arrangements: Alone Available Help at Discharge: Family;Available PRN/intermittently (children check in on patient) Type of Home: Apartment Home Access: Level entry (one step on side walk to reach ground floor apt)     Home Layout: One level     Bathroom Shower/Tub: Teacher, early years/pre: Handicapped height Bathroom Accessibility: No   Home Equipment: Environmental consultant - 2 wheels;Bedside commode   Additional Comments:  driving, retired. dog teacup poodle      Prior Functioning/Environment Level of Independence: Independent                 OT Problem List: Decreased strength;Decreased activity tolerance;Impaired balance (sitting and/or standing);Decreased safety awareness;Decreased knowledge of use of DME or AE;Decreased knowledge of precautions;Cardiopulmonary status limiting activity;Obesity;Increased edema   OT Treatment/Interventions: Self-care/ADL training;Therapeutic exercise;Energy conservation;DME and/or AE instruction;Therapeutic activities;Patient/family education;Balance training    OT Goals(Current goals can be found in the care plan section) Acute Rehab OT Goals Patient Stated Goal: to return home to dog OT Goal Formulation: With patient Time For Goal Achievement: 10/17/16 Potential to Achieve Goals: Good  OT Frequency: Min 2X/week   Barriers to D/C: Decreased caregiver support          Co-evaluation              End of Session Equipment Utilized During Treatment: Gait belt Nurse Communication: Mobility status;Precautions  Activity Tolerance: Other (comment) (limited by dizziness decr BP) Patient left: in bed;with call bell/phone within reach;with bed alarm set;with nursing/sitter in room   Time: 2774-1287 OT Time Calculation (min): 33 min Charges:  OT General Charges $OT Visit: 1 Procedure OT Evaluation $OT Eval Moderate Complexity: 1 Procedure G-Codes:    Peri Maris 10/25/2016, 2:54 PM   Rachael [name]   OTR/L Pager: [phone] Office: [phone] .

## 2016-10-03 NOTE — Progress Notes (Signed)
[facility] Progress Note    Subjective:    Had HD 1/24 Renal biopsy 1/25 No results as of yet Had episode when up to BR "blacked out for a second"  Fine back in bed VS were stable   Objective:   BP 128/64 (BP Location: Right Arm)   Pulse 76   Temp 98.2 F (36.8 C) (Oral)   Resp 18   Ht 5\' 2"  (1.575 m)   Wt 101.8 kg (224 lb 6.9 oz)   SpO2 98%   BMI 41.05 kg/m   Intake/Output Summary (Last 24 hours) at 10/03/16 1424 Last data filed at 10/03/16 0900  Gross per 24 hour  Intake              960 ml  Output             1525 ml  Net             -565 ml   Weight change: 0.3 kg (10.6 oz)  Physical Exam: Resting comfortable post bx without back pain or hematuria No JVD Lungs clear anteriorly Regular, s1S2 No S3 Abd soft non tender LE's chubby.  No edema of LE's R IJ trialysis cathwith dressing (1/22)  Recent Labs Lab 09/28/16 0359 09/29/16 0659 09/29/16 1318 09/30/16 0523 10/01/16 0616 10/02/16 0443 10/03/16 0507  NA 140 138 139 136 137 137 138  K 3.6 3.7 3.9 2.9* 3.8 3.7 3.5  CL 110 110 107 104 107 103 105  CO2 21* 18* 20* 21* 20* 25 23  GLUCOSE 90 102* 89 101* 99 102* 101*  BUN 38* 43* 47* 28* 41* 28* 38*  CREATININE 6.92* 7.20* 7.32* 4.52* 6.27* 4.47* 6.12*  CALCIUM 9.0 9.2 9.2 8.5* 8.7* 8.5* 8.7*  PHOS 4.3 4.8* 5.1* 2.9 4.8* 4.2 4.9*    Recent Labs Lab 09/29/16 1318 10/01/16 0616 10/02/16 1516 10/03/16 0507  WBC 7.9 7.2 7.1 7.5  HGB 11.2* 10.4* 10.4* 10.1*  HCT 34.9* 33.3* 33.0* 31.8*  MCV 79.1 79.9 79.9 80.3  PLT 163 146* 128* 128*    Medications:    . citalopram  20 mg Oral Daily  . famotidine  20 mg Oral Daily  . heparin  5,000 Units Subcutaneous Q8H  . levothyroxine  100 mcg Oral QAC breakfast  . metoCLOPramide  10 mg Oral TID AC  . polyethylene glycol  17 g Oral Daily  . senna-docusate  1 tablet Oral BID  . tamsulosin  0.4 mg Oral Daily    Background:  30F with a PMH significant for CHF, CKD Stage III, h/o CVA,  hypothyroidism, and chronic pancreatitis (per pt) with AKI on CKD. Baseline creatinine 1.4-17. Had n/v with poor PO intake starting on Dec 30th.  Tried to treat at home with fluids and bowel rest as able.  Vomited 2-3 times daily.  Didn't get much better so went to ED at [facility] 1/11 where CT abd/pelvis was done showing hiatal hernia. Cr was up to 7.3.  IVF x 48 hours, Cr down to 6.19.  Transferred here for further management. No NSAIDs, ACEi, or OTC supplements.  Still urinating. US abdomen without hydro. Serologic tests all neg. HD initiated 1/22. Renal biopsy 1/25.   Assessment/ Plan:    1. AKI on CKD III (baseline creatinine 1.4-1.7) -  Initially thought dry, non-resolved w/hydration. No offensive medicines. Proteinuria (UPC 1.5 gm) Glucose on UA but no h/o diabetes, A1C 5.6, ANA, ANCA negative; complements WNL, HIV neg, hepatitis B and C neg, SPEP unremarkable,  serum free light chains with ratio of 1.49.  Got temp HD cath and 1st HD 1/22, 2nd on 1/24. No HD indication today. s/p renal biopsy 1/25. No results at this time. Creatinine still rising in the interdialytic interval.  Anticipate HD tomorrow. No heparin. 2. Hypokalemia - have been using 4K bath and repleting. Today 3.7. 3. Chronic pancreatitis/nv - better with reglan 4. UTI- s/p CTX, urine culture negative--? AIN 5. H/o CVA: on Plavix, have stopped this in setting of renal biopsy (off since 1/20). Would like to keep off a bit longer.  Jamal Maes, MD [facility] [phone] Pager 10/03/2016, 2:24 PM

## 2016-10-03 NOTE — Progress Notes (Signed)
PT Cancellation Note  Patient Details Name: Rachael [name] MRN: [id number] DOB: 10-Apr-1956   Cancelled Treatment:    Reason Eval/Treat Not Completed: Medical issues which prohibited therapy (Patient orthostatic with OT, requested treatment be deferred today)   Shanna Cisco 10/03/2016, 3:12 PM

## 2016-10-03 NOTE — Care Management Important Message (Signed)
Important Message  Patient Details  Name: Rachael [name] MRN: [id number] Date of Birth: 22-Apr-1956   Medicare Important Message Given:  Yes    Nathen May 10/03/2016, 2:28 PM

## 2016-10-04 ENCOUNTER — Inpatient Hospital Stay (HOSPITAL_COMMUNITY): Payer: Medicare HMO

## 2016-10-04 DIAGNOSIS — D649 Anemia, unspecified: Secondary | ICD-10-CM

## 2016-10-04 LAB — RENAL FUNCTION PANEL
Albumin: 2.8 g/dL — ABNORMAL LOW (ref 3.5–5.0)
Anion gap: 11 (ref 5–15)
BUN: 43 mg/dL — AB (ref 6–20)
CALCIUM: 8.7 mg/dL — AB (ref 8.9–10.3)
CHLORIDE: 106 mmol/L (ref 101–111)
CO2: 21 mmol/L — AB (ref 22–32)
CREATININE: 6.98 mg/dL — AB (ref 0.44–1.00)
GFR, EST AFRICAN AMERICAN: 7 mL/min — AB (ref >60)
GFR, EST NON AFRICAN AMERICAN: 6 mL/min — AB (ref >60)
Glucose, Bld: 97 mg/dL (ref 65–99)
Phosphorus: 5.5 mg/dL — ABNORMAL HIGH (ref 2.5–4.6)
Potassium: 3.4 mmol/L — ABNORMAL LOW (ref 3.5–5.1)
Sodium: 138 mmol/L (ref 135–145)

## 2016-10-04 LAB — CBC
HCT: 31.1 % — ABNORMAL LOW (ref 36.0–46.0)
Hemoglobin: 9.9 g/dL — ABNORMAL LOW (ref 12.0–15.0)
MCH: 25.4 pg — AB (ref 26.0–34.0)
MCHC: 31.8 g/dL (ref 30.0–36.0)
MCV: 79.9 fL (ref 78.0–100.0)
PLATELETS: 124 10*3/uL — AB (ref 150–400)
RBC: 3.89 MIL/uL (ref 3.87–5.11)
RDW: 16.7 % — AB (ref 11.5–15.5)
WBC: 7.4 10*3/uL (ref 4.0–10.5)

## 2016-10-04 MED ORDER — ALPRAZOLAM 0.25 MG PO TABS
0.2500 mg | ORAL_TABLET | Freq: Every day | ORAL | Status: DC | PRN
  Administered 2016-10-04 – 2016-10-13 (×2): 0.25 mg via ORAL
  Filled 2016-10-04 (×3): qty 1

## 2016-10-04 MED ORDER — POTASSIUM CHLORIDE CRYS ER 20 MEQ PO TBCR
20.0000 meq | EXTENDED_RELEASE_TABLET | Freq: Once | ORAL | Status: AC
  Administered 2016-10-04: 20 meq via ORAL
  Filled 2016-10-04 (×2): qty 1

## 2016-10-04 NOTE — Progress Notes (Signed)
[facility] PROGRESS NOTE  Rachael [name] SWN:[id number] DOB: Jan 12, 1956 DOA: 09/22/2016  PCP: No primary care provider on file.  Brief History/Interval Summary: 61 year old female with a past medical history of chronic kidney disease, hypertension, hypothyroidism, presented with nausea, vomiting. Patient was found to have acute kidney injury. She was transferred to this facility from [facility]. Patient to be started on dialysis. She underwent Renal biopsy 1/25.  Reason for Visit: Acute kidney injury  Consultants: Nephrology  Procedures:  Hemodialysis  Renal biopsy 1/25  Antibiotics: None  Subjective/Interval History: Patient did not have any episodes of vomiting yesterday. Denies any abdominal pain. Denies any difficulty breathing.   ROS: Denies any chest pain or shortness of breath.  Objective:  Vital Signs  Vitals:   10/03/16 1705 10/03/16 2101 10/04/16 0417 10/04/16 0903  BP: (!) 116/54 (!) 125/58 (!) 112/54 126/62  Pulse: 79 83 85 83  Resp: 16 18 16 18   Temp: 98.4 F (36.9 C) 98.2 F (36.8 C) 98.9 F (37.2 C) 98.4 F (36.9 C)  TempSrc: Oral Oral Oral Oral  SpO2: 94% 97% 92% 96%  Weight:  102.5 kg (226 lb)    Height:        Intake/Output Summary (Last 24 hours) at 10/04/16 1347 Last data filed at 10/04/16 0900  Gross per 24 hour  Intake              480 ml  Output             1450 ml  Net             -970 ml   Filed Weights   10/01/16 1002 10/02/16 2144 10/03/16 2101  Weight: 101.5 kg (223 lb 12.3 oz) 101.8 kg (224 lb 6.9 oz) 102.5 kg (226 lb)    General appearance: alert, cooperative, appears stated age and no distress Resp: clear to auscultation bilaterally Cardio: regular rate and rhythm, S1, S2 normal, no murmur, click, rub or gallop GI: Abdomen is soft. Nontender. Nondistended. No masses or organomegaly. Bowel sounds are present.  Extremities: Minimal bilateral pedal edema Neurologic: No focal deficits  Lab Results:  Data  Reviewed: I have personally reviewed following labs and imaging studies  CBC:  Recent Labs Lab 09/29/16 1318 10/01/16 0616 10/02/16 1516 10/03/16 0507  WBC 7.9 7.2 7.1 7.5  HGB 11.2* 10.4* 10.4* 10.1*  HCT 34.9* 33.3* 33.0* 31.8*  MCV 79.1 79.9 79.9 80.3  PLT 163 146* 128* 128*    Basic Metabolic Panel:  Recent Labs Lab 09/30/16 0523 10/01/16 0616 10/02/16 0443 10/03/16 0507 10/04/16 0530  NA 136 137 137 138 138  K 2.9* 3.8 3.7 3.5 3.4*  CL 104 107 103 105 106  CO2 21* 20* 25 23 21*  GLUCOSE 101* 99 102* 101* 97  BUN 28* 41* 28* 38* 43*  CREATININE 4.52* 6.27* 4.47* 6.12* 6.98*  CALCIUM 8.5* 8.7* 8.5* 8.7* 8.7*  PHOS 2.9 4.8* 4.2 4.9* 5.5*    GFR: Estimated Creatinine Clearance: 9.4 mL/min (by C-G formula based on SCr of 6.98 mg/dL (H)).  Liver Function Tests:  Recent Labs Lab 09/30/16 0523 10/01/16 0616 10/02/16 0443 10/03/16 0507 10/04/16 0530  ALBUMIN 3.0* 3.0* 2.9* 2.8* 2.8*      Radiology Studies: US Abdomen Complete  Result Date: 10/04/2016 CLINICAL DATA:  Nausea and vomiting, remote cholecystectomy, chronic renal disease EXAM: ABDOMEN ULTRASOUND COMPLETE COMPARISON:  09/19/2016, 09/21/2017 FINDINGS: Gallbladder: Surgically absent Common bile duct: Diameter: 2.4 mm, difficult to visualize because of bowel gas.  Liver: Limited assessment of the left lobe secondary to bowel gas. Imaged portion of the liver demonstrates no definite focal abnormality or biliary dilatation. Patent portal vein with normal hepatopetal flow. IVC: Limited visualization because of body habitus Pancreas: Visualized portion unremarkable. Spleen: Size and appearance within normal limits. Right Kidney: Length: 8.8 cm. Cortical thinning. Normal echogenicity. No hydronephrosis. No significant focal abnormality Left Kidney: Length: 9.2 cm. Similar cortical thinning. No definite focal abnormality or hydronephrosis Abdominal aorta: No aneurysm visualized. Other findings: None. IMPRESSION:  Limited because of body habitus and bowel gas. Remote cholecystectomy No definite acute intra-abdominal finding by ultrasound Electronically Signed   By: Jerilynn Mages.  Shick M.D.   On: 10/04/2016 10:06     Medications:  Scheduled: . citalopram  20 mg Oral Daily  . clopidogrel  75 mg Oral Daily  . famotidine  20 mg Oral Daily  . heparin  5,000 Units Subcutaneous Q8H  . levothyroxine  100 mcg Oral QAC breakfast  . metoCLOPramide  10 mg Oral TID AC  . polyethylene glycol  17 g Oral Daily  . senna-docusate  1 tablet Oral BID  . tamsulosin  0.4 mg Oral Daily   Continuous:  AJO:INOMVE chloride, sodium chloride, alteplase, heparin, lidocaine (PF), lidocaine-prilocaine, ondansetron (ZOFRAN) IV, pentafluoroprop-tetrafluoroeth, promethazine **OR** promethazine **OR** promethazine  Assessment/Plan:  Principal Problem:   Acute renal failure superimposed on chronic kidney disease ([place]) Active Problems:   UTI (urinary tract infection)   Nausea and vomiting   HTN (hypertension)   Hypothyroidism   AKI (acute kidney injury) ([place])    Acute renal failure superimposed on chronic kidney disease Patient had chronic kidney disease stage III. Presented with acute renal failure. Etiology is unclear so far. Nephrology is following. Patient underwent renal biopsy 1/25. She was briefly on IV antibiotics. Cultures are negative. Dialysis per nephrology.    Hypothyroidism. Continue Synthroid 100 mcg daily  Recurrent nausea and vomiting CT abdomen in other facility 09/19/2016 shows no acute abnormalities other than hiatal hernia. Ultrasound abdomen 09/21/2016 shows no evidence of obstruction, no hydronephrosis, bilateral renal size roughly 9 cm. Continues to have occasional vomiting despite Reglan. Ultrasound repeated. No obvious abnormalities noted. She is status post cholecystectomy. Renal failure and dialysis could be contributing. Dose of Reglan was increased yesterday. No vomiting in the last 24 hours. Continue  to monitor.   History of stroke Stable. Plavix was placed on hold for renal biopsy. Discussed with Dr. Kathlene Cote yesterday. He stated that it was okay to resume her Plavix.  Normocytic anemia Hemoglobin has been stable. Likely due to anemia of chronic disease. Check hemoglobin tomorrow.  GERD. Stable  DVT Prophylaxis: SubCutaneous heparin Code Status: Full code  Family Communication: Discussed with patient  Disposition Plan: Management as outlined above. Mobilize. PT evaluation.    LOS: 12 days   [place] [facility] Pager [phone] 10/04/2016, 1:47 PM  If 7PM-7AM, please contact night-coverage at [email or web address], password [facility]

## 2016-10-04 NOTE — Progress Notes (Signed)
PT Cancellation Note  Patient Details Name: Rachael [name] MRN: [id number] DOB: [birth year]   Cancelled Treatment:    Reason Eval/Treat Not Completed: Patient at procedure or test/unavailable.  Pt taken to HD before this therapist could see her.  Will see for evaluation as able 1/28. 10/04/2016  Rachael [name], [place] [phone]  (pager)  Rachael [name] Rachael [name] 10/04/2016, 2:02 PM

## 2016-10-04 NOTE — Procedures (Signed)
I have personally attended this patient's dialysis session. BP soft low 90's Taking off no volume Temp cath running at 250 4K bath (K 3.4) No heparin Anxious - has prn xanax if needed    Jamal Maes, MD [place] Pager 10/04/2016, 3:59 PM

## 2016-10-04 NOTE — Progress Notes (Signed)
Rachael [name] Progress Note    Subjective:    Seen in HD Says feels "pretty good" No back pain or hematuria Renal bx 1/25 - hoping for prelim results on Monday    Objective:   BP (!) 106/55   Pulse 76   Temp 98.2 F (36.8 C) (Oral)   Resp 16   Ht 5\' 2"  (1.575 m)   Wt 101.8 kg (224 lb 6.9 oz)   SpO2 96%   BMI 41.05 kg/m   Intake/Output Summary (Last 24 hours) at 10/04/16 1554 Last data filed at 10/04/16 0900  Gross per 24 hour  Intake              480 ml  Output             1450 ml  Net             -970 ml   Weight change: 0.713 kg (1 lb 9.2 oz)  Physical Exam: Seen in HD Resting comfortably BP soft 90's No JVD Lungs clear anteriorly Regular, s1S2 No S3 Abd soft non tender LE's chubby.  No edema of LE's R IJ trialysis cathwith dressing (1/22) in use  Recent Labs Lab 09/29/16 0659 09/29/16 1318 09/30/16 0523 10/01/16 0616 10/02/16 0443 10/03/16 0507 10/04/16 0530  NA 138 139 136 137 137 138 138  K 3.7 3.9 2.9* 3.8 3.7 3.5 3.4*  CL 110 107 104 107 103 105 106  CO2 18* 20* 21* 20* 25 23 21*  GLUCOSE 102* 89 101* 99 102* 101* 97  BUN 43* 47* 28* 41* 28* 38* 43*  CREATININE 7.20* 7.32* 4.52* 6.27* 4.47* 6.12* 6.98*  CALCIUM 9.2 9.2 8.5* 8.7* 8.5* 8.7* 8.7*  PHOS 4.8* 5.1* 2.9 4.8* 4.2 4.9* 5.5*    Recent Labs Lab 10/01/16 0616 10/02/16 1516 10/03/16 0507 10/04/16 1456  WBC 7.2 7.1 7.5 7.4  HGB 10.4* 10.4* 10.1* 9.9*  HCT 33.3* 33.0* 31.8* 31.1*  MCV 79.9 79.9 80.3 79.9  PLT 146* 128* 128* 124*    Medications:    . citalopram  20 mg Oral Daily  . clopidogrel  75 mg Oral Daily  . famotidine  20 mg Oral Daily  . heparin  5,000 Units Subcutaneous Q8H  . levothyroxine  100 mcg Oral QAC breakfast  . metoCLOPramide  10 mg Oral TID AC  . polyethylene glycol  17 g Oral Daily  . senna-docusate  1 tablet Oral BID  . tamsulosin  0.4 mg Oral Daily    Background:  15F with a PMH significant for CHF, CKD Stage III, h/o CVA,  hypothyroidism, and chronic pancreatitis (per pt) with AKI on CKD. Baseline creatinine 1.4-17. Had n/v with poor PO intake starting on Dec 30th.  Tried to treat at home with fluids and bowel rest as able.  Vomited 2-3 times daily.  Didn't get much better so went to ED at [facility] 1/[facility] where CT abd/pelvis was done showing hiatal hernia. Cr was up to 7.3.  IVF x 48 hours, Cr down to 6.19.  Transferred here for further management. No NSAIDs, ACEi, or OTC supplements.  Non-oliguric. [place] without hydro, kidneys 8.8, 9.2. Serologic tests all neg. HD initiated 1/22 via temp cath placed in IR (1/22). Renal biopsy 1/25.   Assessment/ Plan:    1. AKI on CKD III (baseline creatinine 1.4-1.7)  Initially thought dry, non-resolved w/hydration. No offensive medicines. Proteinuria (UPC 1.5 gm) Glucose on UA but no h/o diabetes, A1C 5.6, ANA, ANCA negative; complements WNL, HIV  neg, hepatitis B and C neg, SPEP unremarkable, serum free light chains with ratio of 1.49.  Got temp HD cath and 1st HD 1/22, 2nd on 1/24, 3rd today. s/p renal biopsy 1/25. No results at this time. Creatinine still rising in the interdialytic interval.   2. Hypokalemia - have been using 4K bath and repleting. Today 3.4 3. Chronic pancreatitis - no N/V at this time 4. UTI- s/p CTX, urine culture negative--? AIN 5. H/o CVA: on Plavix, have stopped this in setting of renal biopsy (off since 1/20). Would like to keep off a bit longer.  Jamal Maes, MD [name] [phone] Pager 10/04/2016, 3:54 PM

## 2016-10-05 LAB — RENAL FUNCTION PANEL
ALBUMIN: 2.8 g/dL — AB (ref 3.5–5.0)
Anion gap: 8 (ref 5–15)
BUN: 17 mg/dL (ref 6–20)
CO2: 25 mmol/L (ref 22–32)
CREATININE: 4.07 mg/dL — AB (ref 0.44–1.00)
Calcium: 8.2 mg/dL — ABNORMAL LOW (ref 8.9–10.3)
Chloride: 105 mmol/L (ref 101–111)
GFR calc Af Amer: 13 mL/min — ABNORMAL LOW (ref >60)
GFR, EST NON AFRICAN AMERICAN: 11 mL/min — AB (ref >60)
GLUCOSE: 98 mg/dL (ref 65–99)
PHOSPHORUS: 2.3 mg/dL — AB (ref 2.5–4.6)
Potassium: 3.4 mmol/L — ABNORMAL LOW (ref 3.5–5.1)
SODIUM: 138 mmol/L (ref 135–145)

## 2016-10-05 LAB — CBC
HEMATOCRIT: 30.4 % — AB (ref 36.0–46.0)
Hemoglobin: 9.5 g/dL — ABNORMAL LOW (ref 12.0–15.0)
MCH: 25 pg — ABNORMAL LOW (ref 26.0–34.0)
MCHC: 31.3 g/dL (ref 30.0–36.0)
MCV: 80 fL (ref 78.0–100.0)
PLATELETS: 126 10*3/uL — AB (ref 150–400)
RBC: 3.8 MIL/uL — ABNORMAL LOW (ref 3.87–5.11)
RDW: 16.9 % — AB (ref 11.5–15.5)
WBC: 6.9 10*3/uL (ref 4.0–10.5)

## 2016-10-05 MED ORDER — PANTOPRAZOLE SODIUM 40 MG PO TBEC
40.0000 mg | DELAYED_RELEASE_TABLET | Freq: Every day | ORAL | Status: DC
  Administered 2016-10-05 – 2016-10-06 (×2): 40 mg via ORAL
  Filled 2016-10-05 (×2): qty 1

## 2016-10-05 MED ORDER — POTASSIUM CHLORIDE CRYS ER 20 MEQ PO TBCR
30.0000 meq | EXTENDED_RELEASE_TABLET | Freq: Once | ORAL | Status: AC
  Administered 2016-10-05: 30 meq via ORAL
  Filled 2016-10-05: qty 1

## 2016-10-05 NOTE — Evaluation (Signed)
Physical Therapy Evaluation Patient Details Name: Rachael [name] MRN: [id number] DOB: 1955/12/22 Today's Date: 10/05/2016   History of Present Illness   61 year old female with a past medical history of chronic kidney disease, hypertension, hypothyroidism, presented with nausea, vomiting. Patient was found to have acute kidney injury. She was transferred to this facility from [facility]. Patient to be started on dialysis. She underwent Renal biopsy 1/25  Clinical Impression  Patient presents with problems listed below.  Will benefit from acute PT to maximize functional independence prior to discharge home.  Patient moves fairly well.  Limited by hypotension - only able to sit EOB.  As BP improves, feel patient will improve with mobility.  Do not anticipate any f/u PT needs for d/c at this time.  Will continue to assess.    Follow Up Recommendations No PT follow up;Supervision - Intermittent    Equipment Recommendations  None recommended by PT    Recommendations for Other Services       Precautions / Restrictions Precautions Precautions: Fall Precaution Comments: watch BP Restrictions Weight Bearing Restrictions: No      Mobility  Bed Mobility Overal bed mobility: Independent             General bed mobility comments: Patient able to don socks sitting EOB.  Patient reports feeling lightheaded in sitting.  Encouraged ankle pumps.  Patient sat EOB x 5 minutes.  Patient reports increase in lightheadedness and nausea.  BP 99/62.   Patient returned to supine.  Transfers                 General transfer comment: Unable to tolerate  Ambulation/Gait                Stairs            Wheelchair Mobility    Modified Rankin (Stroke Patients Only)       Balance Overall balance assessment: Needs assistance Sitting-balance support: No upper extremity supported;Feet supported Sitting balance-Leahy Scale: Good                                        Pertinent Vitals/Pain Pain Assessment: No/denies pain    Home Living Family/patient expects to be discharged to:: Private residence Living Arrangements: Alone Available Help at Discharge: Family;Available PRN/intermittently (children check on patient) Type of Home: Apartment Home Access: Level entry     Home Layout: One level Home Equipment: Walker - 2 wheels;Bedside commode Additional Comments: driving, retired. dog teacup poodle    Prior Function Level of Independence: Independent               Hand Dominance   Dominant Hand: Right    Extremity/Trunk Assessment   Upper Extremity Assessment Upper Extremity Assessment: Defer to OT evaluation    Lower Extremity Assessment Lower Extremity Assessment: Generalized weakness (Edema noted BLE's)       Communication   Communication: No difficulties  Cognition Arousal/Alertness: Awake/alert Behavior During Therapy: WFL for tasks assessed/performed Overall Cognitive Status: Within Functional Limits for tasks assessed                      General Comments      Exercises General Exercises - Lower Extremity Ankle Circles/Pumps: AROM;Both;10 reps;Seated (Encouraged throughout day)   Assessment/Plan    PT Assessment Patient needs continued PT services  PT Problem List Decreased strength;Decreased activity tolerance;Decreased balance;Decreased  mobility;Decreased knowledge of use of DME;Cardiopulmonary status limiting activity;Obesity          PT Treatment Interventions DME instruction;Gait training;Functional mobility training;Therapeutic activities;Therapeutic exercise;Balance training;Patient/family education    PT Goals (Current goals can be found in the Care Plan section)  Acute Rehab PT Goals Patient Stated Goal: Return home PT Goal Formulation: With patient Time For Goal Achievement: 10/12/16 Potential to Achieve Goals: Good    Frequency Min 3X/week   Barriers to discharge Decreased  caregiver support Lives alone.  PRN assist from family.    Co-evaluation               End of Session   Activity Tolerance: Patient limited by fatigue;Treatment limited secondary to medical complications (Comment) (Decrease in BP) Patient left: in bed;with call bell/phone within reach;with bed alarm set           Time: 1410-1421 PT Time Calculation (min) (ACUTE ONLY): 11 min   Charges:   PT Evaluation $PT Eval Moderate Complexity: 1 Procedure     PT G Codes:        Despina Pole 13-Oct-2016, 6:59 PM Carita Pian. Sanjuana Kava, [place] Pager [phone]

## 2016-10-05 NOTE — Progress Notes (Signed)
[facility] PROGRESS NOTE  Rachael [name] QMV:[id number] DOB: Feb 05, 1956 DOA: 09/22/2016  PCP: No primary care provider on file.  Brief History/Interval Summary: 61 year old female with a past medical history of chronic kidney disease, hypertension, hypothyroidism, presented with nausea, vomiting. Patient was found to have acute kidney injury. She was transferred to this facility from [facility]. Patient to be started on dialysis. She underwent Renal biopsy 1/25.  Reason for Visit: Acute kidney injury  Consultants: Nephrology  Procedures:  Hemodialysis  Renal biopsy 1/25  Antibiotics: None  Subjective/Interval History: Patient reports nausea yesterday but no vomiting. She does report a history of GERD and does take PPI at home. No other complaints.  ROS: Denies any chest pain or shortness of breath.  Objective:  Vital Signs  Vitals:   10/04/16 1755 10/04/16 2027 10/05/16 0644 10/05/16 0700  BP: 121/61 (!) 143/48 95/65   Pulse: 78 89 84   Resp: 18 18 18    Temp: 98.5 F (36.9 C) 99.3 F (37.4 C) 98.4 F (36.9 C) 98.6 F (37 C)  TempSrc: Oral Oral Oral Oral  SpO2: 96% 94% 95% 96%  Weight: 101.6 kg (223 lb 15.8 oz)     Height:        Intake/Output Summary (Last 24 hours) at 10/05/16 0923 Last data filed at 10/05/16 0644  Gross per 24 hour  Intake              240 ml  Output             1903 ml  Net            -1663 ml   Filed Weights   10/03/16 2101 10/04/16 1400 10/04/16 1755  Weight: 102.5 kg (226 lb) 101.8 kg (224 lb 6.9 oz) 101.6 kg (223 lb 15.8 oz)    General appearance: alert, cooperative, appears stated age and no distress Resp: clear to auscultation bilaterally Cardio: regular rate and rhythm, S1, S2 normal, no murmur, click, rub or gallop GI: Abdomen is soft. Nontender. Nondistended. No masses or organomegaly. Bowel sounds are present.  Extremities: Minimal bilateral pedal edema Neurologic: No focal deficits  Lab Results:  Data  Reviewed: I have personally reviewed following labs and imaging studies  CBC:  Recent Labs Lab 10/01/16 0616 10/02/16 1516 10/03/16 0507 10/04/16 1456 10/05/16 0541  WBC 7.2 7.1 7.5 7.4 6.9  HGB 10.4* 10.4* 10.1* 9.9* 9.5*  HCT 33.3* 33.0* 31.8* 31.1* 30.4*  MCV 79.9 79.9 80.3 79.9 80.0  PLT 146* 128* 128* 124* 126*    Basic Metabolic Panel:  Recent Labs Lab 10/01/16 0616 10/02/16 0443 10/03/16 0507 10/04/16 0530 10/05/16 0541  NA 137 137 138 138 138  K 3.8 3.7 3.5 3.4* 3.4*  CL 107 103 105 106 105  CO2 20* 25 23 21* 25  GLUCOSE 99 102* 101* 97 98  BUN 41* 28* 38* 43* 17  CREATININE 6.27* 4.47* 6.12* 6.98* 4.07*  CALCIUM 8.7* 8.5* 8.7* 8.7* 8.2*  PHOS 4.8* 4.2 4.9* 5.5* 2.3*    GFR: Estimated Creatinine Clearance: 16 mL/min (by C-G formula based on SCr of 4.07 mg/dL (H)).  Liver Function Tests:  Recent Labs Lab 10/01/16 0616 10/02/16 0443 10/03/16 0507 10/04/16 0530 10/05/16 0541  ALBUMIN 3.0* 2.9* 2.8* 2.8* 2.8*      Radiology Studies: US Abdomen Complete  Result Date: 10/04/2016 CLINICAL DATA:  Nausea and vomiting, remote cholecystectomy, chronic renal disease EXAM: ABDOMEN ULTRASOUND COMPLETE COMPARISON:  09/19/2016, 09/21/2017 FINDINGS: Gallbladder: Surgically absent Common bile  duct: Diameter: 2.4 mm, difficult to visualize because of bowel gas. Liver: Limited assessment of the left lobe secondary to bowel gas. Imaged portion of the liver demonstrates no definite focal abnormality or biliary dilatation. Patent portal vein with normal hepatopetal flow. IVC: Limited visualization because of body habitus Pancreas: Visualized portion unremarkable. Spleen: Size and appearance within normal limits. Right Kidney: Length: 8.8 cm. Cortical thinning. Normal echogenicity. No hydronephrosis. No significant focal abnormality Left Kidney: Length: 9.2 cm. Similar cortical thinning. No definite focal abnormality or hydronephrosis Abdominal aorta: No aneurysm visualized.  Other findings: None. IMPRESSION: Limited because of body habitus and bowel gas. Remote cholecystectomy No definite acute intra-abdominal finding by ultrasound Electronically Signed   By: Jerilynn Mages.  Shick M.D.   On: 10/04/2016 10:06     Medications:  Scheduled: . citalopram  20 mg Oral Daily  . clopidogrel  75 mg Oral Daily  . famotidine  20 mg Oral Daily  . heparin  5,000 Units Subcutaneous Q8H  . levothyroxine  100 mcg Oral QAC breakfast  . metoCLOPramide  10 mg Oral TID AC  . pantoprazole  40 mg Oral Q1200  . polyethylene glycol  17 g Oral Daily  . senna-docusate  1 tablet Oral BID  . tamsulosin  0.4 mg Oral Daily   Continuous:  VBT:YOMAYO chloride, sodium chloride, ALPRAZolam, alteplase, heparin, lidocaine (PF), lidocaine-prilocaine, ondansetron (ZOFRAN) IV, pentafluoroprop-tetrafluoroeth, promethazine **OR** promethazine **OR** promethazine  Assessment/Plan:  Principal Problem:   Acute renal failure superimposed on chronic kidney disease ([place]) Active Problems:   UTI (urinary tract infection)   Nausea and vomiting   HTN (hypertension)   Hypothyroidism   AKI (acute kidney injury) ([place])    Acute renal failure superimposed on chronic kidney disease Patient had chronic kidney disease stage III. Presented with acute renal failure. Etiology is unclear so far. Nephrology is following. Patient underwent renal biopsy 1/25. She was briefly on IV antibiotics. Cultures are negative. Dialysis per nephrology. Last dialyzed 1/27.   Hypothyroidism. Continue Synthroid 100 mcg daily  Recurrent nausea and vomiting CT abdomen in other facility 09/19/2016 shows no acute abnormalities other than hiatal hernia. Ultrasound abdomen 09/21/2016 shows no evidence of obstruction, no hydronephrosis, bilateral renal size roughly 9 cm. Continues to have occasional vomiting despite Reglan. Ultrasound repeated. No obvious abnormalities noted. She is status post cholecystectomy. Renal failure and dialysis could  be contributing. Dose of Reglan was increased 1/26. Protonix initiated today. No vomiting in the last 48 hours. Continue to monitor.   History of stroke Stable. Plavix was placed on hold for renal biopsy. Plavix reinitiated after discussions with Dr. Kathlene Cote 1/26.   Normocytic anemia Hemoglobin has been stable. Likely due to anemia of chronic disease. Hemoglobin is stable.  GERD. Stable. PPI.  DVT Prophylaxis: SubCutaneous heparin Code Status: Full code  Family Communication: Discussed with patient  Disposition Plan: Management as outlined above. Mobilize. PT evaluation.    LOS: 13 days   [place] [facility] Pager [phone] 10/05/2016, 9:23 AM  If 7PM-7AM, please contact night-coverage at [email or web address], password [facility]

## 2016-10-05 NOTE — Progress Notes (Signed)
[facility] Progress Note    Subjective:    HD 1/27 well tolerated No back pain or hematuria Renal bx 1/25 - hoping for prelim results on Monday Feeling nauseous again (she says common with her chronic pancreatitis)    Objective:   BP 95/65 (BP Location: Right Arm)   Pulse 84   Temp 98.6 F (37 C) (Oral)   Resp 18   Ht 5\' 2"  (1.575 m)   Wt 101.6 kg (223 lb 15.8 oz)   SpO2 96%   BMI 40.97 kg/m   Intake/Output Summary (Last 24 hours) at 10/05/16 1335 Last data filed at 10/05/16 0900  Gross per 24 hour  Intake              360 ml  Output             2153 ml  Net            -1793 ml   Weight change: -0.713 kg (-1 lb 9.2 oz)  Physical Exam: Resting comfortably VS as noted No JVD Lungs clear anteriorly Regular, s1S2 No S3 Abd soft non tender LE's chubby. No pitting edema R IJ trialysis cath with dressing (1/22)   Recent Labs Lab 09/29/16 1318 09/30/16 0523 10/01/16 0616 10/02/16 0443 10/03/16 0507 10/04/16 0530 10/05/16 0541  NA 139 136 137 137 138 138 138  K 3.9 2.9* 3.8 3.7 3.5 3.4* 3.4*  CL 107 104 107 103 105 106 105  CO2 20* 21* 20* 25 23 21* 25  GLUCOSE 89 101* 99 102* 101* 97 98  BUN 47* 28* 41* 28* 38* 43* 17  CREATININE 7.32* 4.52* 6.27* 4.47* 6.12* 6.98* 4.07*  CALCIUM 9.2 8.5* 8.7* 8.5* 8.7* 8.7* 8.2*  PHOS 5.1* 2.9 4.8* 4.2 4.9* 5.5* 2.3*    Recent Labs Lab 10/02/16 1516 10/03/16 0507 10/04/16 1456 10/05/16 0541  WBC 7.1 7.5 7.4 6.9  HGB 10.4* 10.1* 9.9* 9.5*  HCT 33.0* 31.8* 31.1* 30.4*  MCV 79.9 80.3 79.9 80.0  PLT 128* 128* 124* 126*    Medications:    . citalopram  20 mg Oral Daily  . clopidogrel  75 mg Oral Daily  . famotidine  20 mg Oral Daily  . heparin  5,000 Units Subcutaneous Q8H  . levothyroxine  100 mcg Oral QAC breakfast  . metoCLOPramide  10 mg Oral TID AC  . pantoprazole  40 mg Oral Q1200  . polyethylene glycol  17 g Oral Daily  . senna-docusate  1 tablet Oral BID  . tamsulosin  0.4 mg Oral  Daily   sodium chloride, sodium chloride, ALPRAZolam, alteplase, heparin, lidocaine (PF), lidocaine-prilocaine, ondansetron (ZOFRAN) IV, pentafluoroprop-tetrafluoroeth, promethazine **OR** promethazine **OR** promethazine  Background:  28F with a PMH significant for CHF, CKD Stage III, h/o CVA, hypothyroidism, and chronic pancreatitis (per pt) with AKI on CKD. Baseline creatinine 1.4-17. Had n/v with poor PO intake starting on Dec 30th.  Tried to treat at home with fluids and bowel rest as able.  Vomited 2-3 times daily.  Didn't get much better so went to ED at [facility] 1/11 where CT abd/pelvis was done showing hiatal hernia. Cr was up to 7.3.  IVF x 48 hours, Cr down to 6.19.  Transferred here for further management. No NSAIDs, ACEi, or OTC supplements.  Non-oliguric. [place] without hydro, kidneys 8.8, 9.2. Serologic tests all neg. HD initiated 1/22 via temp cath placed in IR (1/22). Renal biopsy 1/25.   Assessment/ Plan:    1. AKI on CKD III (  baseline creatinine 1.4-1.7)  Initially thought dry, non-resolved w/hydration. No offensive medicines. Proteinuria (UPC 1.5 gm). Glucosuria on UA but no h/o diabetes, A1C 5.6, ANA, ANCA negative; complements WNL, HIV neg, hepatitis B and C neg, SPEP unremarkable, serum free light chains with ratio of 1.49.  Temp HD cath and 1st HD 1/22, 2nd on 1/24, 3rd 1/27. s/p renal biopsy 1/25. No results at this time. Creatinine still rising in the interdialytic interval. Trend labs, anticipate next HD on Tuesday. Will call for prelim bx results Monday. 2. Hypokalemia - have been using 4K bath and repleting po almost daily. 3.4 today. Give 30 K po X 1 3. Chronic pancreatitis - recurrent nausea. Prn zofran 4. UTI- s/p CTX, urine culture negative--? AIN 5. H/o CVA: was on Plavix, stopped this in setting of renal biopsy (off since 1/20). Would like to continue to hold for now (minimally a couple of weeks post renal bx)  Jamal Maes, MD [place]  Pager 10/05/2016, 1:35 PM

## 2016-10-06 LAB — RENAL FUNCTION PANEL
ANION GAP: 8 (ref 5–15)
Albumin: 2.9 g/dL — ABNORMAL LOW (ref 3.5–5.0)
BUN: 29 mg/dL — AB (ref 6–20)
CHLORIDE: 108 mmol/L (ref 101–111)
CO2: 23 mmol/L (ref 22–32)
Calcium: 8.7 mg/dL — ABNORMAL LOW (ref 8.9–10.3)
Creatinine, Ser: 5.61 mg/dL — ABNORMAL HIGH (ref 0.44–1.00)
GFR, EST AFRICAN AMERICAN: 9 mL/min — AB (ref >60)
GFR, EST NON AFRICAN AMERICAN: 7 mL/min — AB (ref >60)
Glucose, Bld: 94 mg/dL (ref 65–99)
PHOSPHORUS: 4.2 mg/dL (ref 2.5–4.6)
POTASSIUM: 4 mmol/L (ref 3.5–5.1)
Sodium: 139 mmol/L (ref 135–145)

## 2016-10-06 MED ORDER — RENA-VITE PO TABS
1.0000 | ORAL_TABLET | Freq: Every day | ORAL | Status: DC
  Administered 2016-10-06 – 2016-10-13 (×8): 1 via ORAL
  Filled 2016-10-06 (×8): qty 1

## 2016-10-06 NOTE — Progress Notes (Signed)
Physical Therapy Treatment Patient Details Name: Rachael [name] MRN: [id number] DOB: [birth year] Today's Date: 10/06/2016    History of Present Illness  61 year old female with a past medical history of chronic kidney disease, hypertension, hypothyroidism, presented with nausea, vomiting. Patient was found to have acute kidney injury. She was transferred to this facility from [facility]. Patient to be started on dialysis. She underwent Renal biopsy 1/25    PT Comments    Pt orthostatic with OT this AM. Unable to tolerate OOB requiring return to supine. PT treatment, therefore, concentrated on BLE exercises in supine. Pt is scheduled for HD tomorrow, 10-07-16.  Follow Up Recommendations  No PT follow up;Supervision - Intermittent     Equipment Recommendations  None recommended by PT    Recommendations for Other Services       Precautions / Restrictions Precautions Precautions: Fall;Other (comment) Precaution Comments: watch BP    Mobility  Bed Mobility Overal bed mobility: Independent                Transfers Overall transfer level: Needs assistance   Transfers: Sit to/from Stand Sit to Stand: Min guard         General transfer comment: pushing up from bed surface  Ambulation/Gait                 Stairs            Wheelchair Mobility    Modified Rankin (Stroke Patients Only)       Balance                                    Cognition Arousal/Alertness: Awake/alert Behavior During Therapy: WFL for tasks assessed/performed Overall Cognitive Status: Within Functional Limits for tasks assessed                      Exercises General Exercises - Lower Extremity Ankle Circles/Pumps: AROM;Both;20 reps;Supine Quad Sets: AROM;Both;10 reps;Supine Gluteal Sets: AROM;Supine;Both;10 reps Heel Slides: AROM;Right;Left;10 reps;Supine Hip ABduction/ADduction: AROM;Right;Left;10 reps;Supine Straight Leg Raises:  AROM;Right;Left;10 reps;Supine Other Exercises Other Exercises: bridging x 10    General Comments        Pertinent Vitals/Pain Pain Assessment: No/denies pain    Home Living                      Prior Function            PT Goals (current goals can now be found in the care plan section) Acute Rehab PT Goals Patient Stated Goal: Return home PT Goal Formulation: With patient Time For Goal Achievement: 10/12/16 Potential to Achieve Goals: Good Progress towards PT goals: Not progressing toward goals - comment (orthostatic)    Frequency    Min 3X/week      PT Plan Current plan remains appropriate    Co-evaluation             End of Session   Activity Tolerance: Patient tolerated treatment well Patient left: in bed;with call bell/phone within reach;with bed alarm set     Time: 0921-0932 PT Time Calculation (min) (ACUTE ONLY): 11 min  Charges:  $Therapeutic Exercise: 8-22 mins                    G Codes:      Lorriane Shire 10/06/2016, 9:45 AM

## 2016-10-06 NOTE — Progress Notes (Signed)
Occupational Therapy Treatment Patient Details Name: Rachael [name] MRN: [id number] DOB: March 12, 1956 Today's Date: 10/06/2016    History of present illness  61 year old female with a past medical history of chronic kidney disease, hypertension, hypothyroidism, presented with nausea, vomiting. Patient was found to have acute kidney injury. She was transferred to this facility from [facility]. Patient to be started on dialysis. She underwent Renal biopsy 1/25   OT comments  Pt with orthostatic BP and unable to static stand longer than 1 minute. Pt symptomatic and requiring return to supine. Pt provided ace wraps this session attempting to help stabilize BP. Pt unable to tolerate. RN made aware. Pt with marks on bil ankles from socks due to edema in bil LE.    Follow Up Recommendations  No OT follow up    Equipment Recommendations  None recommended by OT    Recommendations for Other Services      Precautions / Restrictions Precautions Precautions: Fall Precaution Comments: watch BP       Mobility Bed Mobility Overal bed mobility: Independent                Transfers Overall transfer level: Needs assistance   Transfers: Sit to/from Stand Sit to Stand: Min guard         General transfer comment: pushing up from bed surface    Balance                                   ADL                                         General ADL Comments: pt provided bil LE x2 ace wraps on each leg to help with orthostatic BP. pt 2 minutes supine, 2 minutes sitting and able to static stand 1 minute total and required return to sitting. pt once sitting machine cycled x2 until finally achieving BP reading. pt symptomatic.       Vision                     Perception     Praxis      Cognition   Behavior During Therapy: [facility] for tasks assessed/performed Overall Cognitive Status: Within Functional Limits for tasks assessed                        Extremity/Trunk Assessment               Exercises General Exercises - Lower Extremity Ankle Circles/Pumps: AROM;Both;10 reps;Supine   Shoulder Instructions       General Comments      Pertinent Vitals/ Pain       Pain Assessment: No/denies pain  Home Living                                          Prior Functioning/Environment              Frequency  Min 2X/week        Progress Toward Goals  OT Goals(current goals can now be found in the care plan section)  Progress towards OT goals: Progressing toward goals  Acute Rehab OT Goals Patient Stated Goal: Return home  OT Goal Formulation: With patient Time For Goal Achievement: 10/17/16 Potential to Achieve Goals: Good ADL Goals Pt Will Perform Grooming: Independently;standing Pt Will Transfer to Toilet: Independently;ambulating;regular height toilet Additional ADL Goal #1: Pt will complete adl task at sink level for > 2 minutes without symtpoms  Plan Discharge plan remains appropriate    Co-evaluation                 End of Session Equipment Utilized During Treatment: Gait belt   Activity Tolerance Treatment limited secondary to medical complications (Comment)   Patient Left in bed;with call bell/phone within reach;with bed alarm set;with nursing/sitter in room   Nurse Communication Mobility status;Precautions        Time: 1834-3735 OT Time Calculation (min): 25 min  Charges: OT General Charges $OT Visit: 1 Procedure OT Treatments $Therapeutic Activity: 23-37 mins  Parke Poisson B 10/06/2016, 9:21 AM  Jeri Modena   OTR/L Pager: [phone] Office: [phone] .

## 2016-10-06 NOTE — Progress Notes (Signed)
[facility] PROGRESS NOTE  Rachael [name] MHD:[id number] DOB: [birth year] DOA: 09/22/2016  PCP: No primary care provider on file.  Brief History/Interval Summary: 61 year old female with a past medical history of chronic kidney disease, hypertension, hypothyroidism, presented with nausea, vomiting. Patient was found to have acute kidney injury. She was transferred to this facility from [facility]. Patient to be started on dialysis. She underwent Renal biopsy 1/25.  Reason for Visit: Acute kidney injury  Consultants: Nephrology  Procedures:  Hemodialysis  Renal biopsy 1/25  Antibiotics: None  Subjective/Interval History: Patient reports getting dizzy when she got up from the bed today. Blood pressure did drop. Denies any chest pain. Feels normal while lying on the bed. Did have an episode of nausea yesterday but no vomiting.   ROS: Denies any chest pain or shortness of breath.  Objective:  Vital Signs  Vitals:   10/06/16 0915 10/06/16 0917 10/06/16 0918 10/06/16 0938  BP: 123/77 112/73 117/68 (!) 130/50  Pulse: 88 94  84  Resp:    18  Temp:    98.4 F (36.9 C)  TempSrc:    Oral  SpO2:    95%  Weight:      Height:        Intake/Output Summary (Last 24 hours) at 10/06/16 0939 Last data filed at 10/06/16 0900  Gross per 24 hour  Intake              840 ml  Output              375 ml  Net              465 ml   Filed Weights   10/03/16 2101 10/04/16 1400 10/04/16 1755  Weight: 102.5 kg (226 lb) 101.8 kg (224 lb 6.9 oz) 101.6 kg (223 lb 15.8 oz)    General appearance: alert, cooperative, appears stated age and no distress Resp: clear to auscultation bilaterally Cardio: regular rate and rhythm, S1, S2 normal, no murmur, click, rub or gallop GI: Abdomen is soft. Nontender. Nondistended. No masses or organomegaly. Bowel sounds are present.  Neurologic: No focal deficits  Lab Results:  Data Reviewed: I have personally reviewed following labs and imaging  studies  CBC:  Recent Labs Lab 10/01/16 0616 10/02/16 1516 10/03/16 0507 10/04/16 1456 10/05/16 0541  WBC 7.2 7.1 7.5 7.4 6.9  HGB 10.4* 10.4* 10.1* 9.9* 9.5*  HCT 33.3* 33.0* 31.8* 31.1* 30.4*  MCV 79.9 79.9 80.3 79.9 80.0  PLT 146* 128* 128* 124* 126*    Basic Metabolic Panel:  Recent Labs Lab 10/02/16 0443 10/03/16 0507 10/04/16 0530 10/05/16 0541 10/06/16 0527  NA 137 138 138 138 139  K 3.7 3.5 3.4* 3.4* 4.0  CL 103 105 106 105 108  CO2 25 23 21* 25 23  GLUCOSE 102* 101* 97 98 94  BUN 28* 38* 43* 17 29*  CREATININE 4.47* 6.12* 6.98* 4.07* 5.61*  CALCIUM 8.5* 8.7* 8.7* 8.2* 8.7*  PHOS 4.2 4.9* 5.5* 2.3* 4.2    GFR: Estimated Creatinine Clearance: 11.6 mL/min (by C-G formula based on SCr of 5.61 mg/dL (H)).  Liver Function Tests:  Recent Labs Lab 10/02/16 0443 10/03/16 0507 10/04/16 0530 10/05/16 0541 10/06/16 0527  ALBUMIN 2.9* 2.8* 2.8* 2.8* 2.9*      Radiology Studies: US Abdomen Complete  Result Date: 10/04/2016 CLINICAL DATA:  Nausea and vomiting, remote cholecystectomy, chronic renal disease EXAM: ABDOMEN ULTRASOUND COMPLETE COMPARISON:  09/19/2016, 09/21/2017 FINDINGS: Gallbladder: Surgically absent Common bile duct:  Diameter: 2.4 mm, difficult to visualize because of bowel gas. Liver: Limited assessment of the left lobe secondary to bowel gas. Imaged portion of the liver demonstrates no definite focal abnormality or biliary dilatation. Patent portal vein with normal hepatopetal flow. IVC: Limited visualization because of body habitus Pancreas: Visualized portion unremarkable. Spleen: Size and appearance within normal limits. Right Kidney: Length: 8.8 cm. Cortical thinning. Normal echogenicity. No hydronephrosis. No significant focal abnormality Left Kidney: Length: 9.2 cm. Similar cortical thinning. No definite focal abnormality or hydronephrosis Abdominal aorta: No aneurysm visualized. Other findings: None. IMPRESSION: Limited because of body  habitus and bowel gas. Remote cholecystectomy No definite acute intra-abdominal finding by ultrasound Electronically Signed   By: Jerilynn Mages.  Shick M.D.   On: 10/04/2016 10:06     Medications:  Scheduled: . citalopram  20 mg Oral Daily  . clopidogrel  75 mg Oral Daily  . famotidine  20 mg Oral Daily  . heparin  5,000 Units Subcutaneous Q8H  . levothyroxine  100 mcg Oral QAC breakfast  . metoCLOPramide  10 mg Oral TID AC  . multivitamin  1 tablet Oral QHS  . pantoprazole  40 mg Oral Q1200  . polyethylene glycol  17 g Oral Daily  . senna-docusate  1 tablet Oral BID  . tamsulosin  0.4 mg Oral Daily   Continuous:  FOY:DXAJOI chloride, sodium chloride, ALPRAZolam, alteplase, heparin, lidocaine (PF), lidocaine-prilocaine, ondansetron (ZOFRAN) IV, pentafluoroprop-tetrafluoroeth, promethazine **OR** promethazine **OR** promethazine  Assessment/Plan:  Principal Problem:   Acute renal failure superimposed on chronic kidney disease ([place]) Active Problems:   UTI (urinary tract infection)   Nausea and vomiting   HTN (hypertension)   Hypothyroidism   AKI (acute kidney injury) ([place])    Acute renal failure superimposed on chronic kidney disease Patient had chronic kidney disease stage III. Presented with acute renal failure. Etiology is unclear so far. Nephrology is following. Patient underwent renal biopsy 1/25. She was briefly on IV antibiotics. Cultures are negative. Dialysis per nephrology. Last dialyzed 1/27.   Hypothyroidism. Continue Synthroid 100 mcg daily  Recurrent nausea and vomiting Patient has significantly improved. No vomiting in the last 3 days. CT abdomen in other facility 09/19/2016 shows no acute abnormalities other than hiatal hernia. Ultrasound abdomen 09/21/2016 shows no evidence of obstruction, no hydronephrosis, bilateral renal size roughly 9 cm. Ultrasound repeated. No obvious abnormalities noted. She is status post cholecystectomy. Renal failure and dialysis could be  contributing. Dose of Reglan was increased 1/26. Protonix initiated 1/28. Continue to monitor.   Orthostatic hypotension Perhaps due to excessive volume being pulled during dialysis. TED stockings for now.  History of stroke Stable. Plavix was placed on hold for renal biopsy. Plavix reinitiated after discussions with Dr. Kathlene Cote 1/26.   Normocytic anemia Hemoglobin has been stable. Likely due to anemia of chronic disease. Hemoglobin is stable.  GERD. Stable. PPI.  DVT Prophylaxis: SubCutaneous heparin Code Status: Full code  Family Communication: Discussed with patient  Disposition Plan: Management as outlined above. Mobilize. PT evaluation.    LOS: 14 days   [facility] Pager [phone] 10/06/2016, 9:39 AM  If 7PM-7AM, please contact night-coverage at [email or web address], password [facility]

## 2016-10-06 NOTE — Progress Notes (Signed)
Subjective: Interval History: has no complaint .  Objective: Vital signs in last 24 hours: Temp:  [98.2 F (36.8 C)-98.9 F (37.2 C)] 98.9 F (37.2 C) (01/29 0442) Pulse Rate:  [80-94] 94 (01/29 0917) Resp:  [18-20] 20 (01/29 0442) BP: (92-126)/(42-77) 117/68 (01/29 0918) SpO2:  [94 %-98 %] 94 % (01/29 0442) Weight change:   Intake/Output from previous day: 01/28 0701 - 01/29 0700 In: 720 [P.O.:720] Out: 625 [Urine:625] Intake/Output this shift: No intake/output data recorded.  General appearance: alert, cooperative, no distress, moderately obese and pale Neck: RIJ cath Resp: clear to auscultation bilaterally Cardio: S1, S2 normal and systolic murmur: holosystolic 2/6, blowing at apex GI: obese, pos bs, soft Extremities: extremities normal, atraumatic, no cyanosis or edema  Lab Results:  Recent Labs  10/04/16 1456 10/05/16 0541  WBC 7.4 6.9  HGB 9.9* 9.5*  HCT 31.1* 30.4*  PLT 124* 126*   BMET:  Recent Labs  10/05/16 0541 10/06/16 0527  NA 138 139  K 3.4* 4.0  CL 105 108  CO2 25 23  GLUCOSE 98 94  BUN 17 29*  CREATININE 4.07* 5.61*  CALCIUM 8.2* 8.7*   No results for input(s): PTH in the last 72 hours. Iron Studies: No results for input(s): IRON, TIBC, TRANSFERRIN, FERRITIN in the last 72 hours.  Studies/Results: US Abdomen Complete  Result Date: 10/04/2016 CLINICAL DATA:  Nausea and vomiting, remote cholecystectomy, chronic renal disease EXAM: ABDOMEN ULTRASOUND COMPLETE COMPARISON:  09/19/2016, 09/21/2017 FINDINGS: Gallbladder: Surgically absent Common bile duct: Diameter: 2.4 mm, difficult to visualize because of bowel gas. Liver: Limited assessment of the left lobe secondary to bowel gas. Imaged portion of the liver demonstrates no definite focal abnormality or biliary dilatation. Patent portal vein with normal hepatopetal flow. IVC: Limited visualization because of body habitus Pancreas: Visualized portion unremarkable. Spleen: Size and appearance  within normal limits. Right Kidney: Length: 8.8 cm. Cortical thinning. Normal echogenicity. No hydronephrosis. No significant focal abnormality Left Kidney: Length: 9.2 cm. Similar cortical thinning. No definite focal abnormality or hydronephrosis Abdominal aorta: No aneurysm visualized. Other findings: None. IMPRESSION: Limited because of body habitus and bowel gas. Remote cholecystectomy No definite acute intra-abdominal finding by ultrasound Electronically Signed   By: Jerilynn Mages.  Shick M.D.   On: 10/04/2016 10:06    I have reviewed the patient's current medications.  Assessment/Plan: 1 CKD/AKI not sure much AKI but could be some aTN.  Awaiting BX.  Will plan HD in am , no urine, follow preHD chem 2 Anemia check Fe , will eval for esa 3 HPTH check 4 Obesity 5 Chronic pancreatitis 6 hypo thyroid P BX, Hd in am, check Fe , PTH    LOS: 14 days   Glendon Fiser L 10/06/2016,9:27 AM

## 2016-10-06 NOTE — Consult Note (Signed)
            [facility] Primary Care Navigator  10/06/2016  Rachael [name] 10-12-1955 [id number]  Able to see patient at the bedside to identify possible discharge needs. Patient reports having nausea, abdominal pain and vomiting episodes on her [birth year] that led to this admission.  Patient endorses Rachael [name] with [facility] as her primary care provider.    Patient shared using [name] delivery and [facility] in [place] to obtain medications without difficulty.   Patient manages her own medications at home using "pill box" system weekly.   Patient reports being able to drive self prior to admission but her children (who live close by) or her sister Rachael [name]) will be able to provide transportation to her doctors' appointments.  Patient reports living alone and independent with care needs prior to admission. Sister will be her primary caregiver at home if needed per patient.   Discharge plan is home according to patient with family's assistance.  Patient voiced understanding to call primary care provider's office when she gets home, for a post discharge follow-up appointment within a week or sooner if needed. Patient letter (with PCP's contact number) was provided as a reminder.  Patient denies any care management needs or concerns at this time. [facility] care management contact information provided for any future needs that may arise.  For additional questions please contact:  Rachael [name], BSN, RN-BC [facility] PRIMARY CARE Navigator Cell: [phone]

## 2016-10-06 NOTE — Progress Notes (Signed)
TED hose ordered.

## 2016-10-07 LAB — IRON AND TIBC
Iron: 44 ug/dL (ref 28–170)
SATURATION RATIOS: 17 % (ref 10.4–31.8)
TIBC: 265 ug/dL (ref 250–450)
UIBC: 221 ug/dL

## 2016-10-07 LAB — RENAL FUNCTION PANEL
ANION GAP: 11 (ref 5–15)
Albumin: 3 g/dL — ABNORMAL LOW (ref 3.5–5.0)
BUN: 36 mg/dL — ABNORMAL HIGH (ref 6–20)
CHLORIDE: 106 mmol/L (ref 101–111)
CO2: 20 mmol/L — AB (ref 22–32)
Calcium: 8.6 mg/dL — ABNORMAL LOW (ref 8.9–10.3)
Creatinine, Ser: 6.82 mg/dL — ABNORMAL HIGH (ref 0.44–1.00)
GFR, EST AFRICAN AMERICAN: 7 mL/min — AB (ref >60)
GFR, EST NON AFRICAN AMERICAN: 6 mL/min — AB (ref >60)
Glucose, Bld: 104 mg/dL — ABNORMAL HIGH (ref 65–99)
POTASSIUM: 3.5 mmol/L (ref 3.5–5.1)
Phosphorus: 4.4 mg/dL (ref 2.5–4.6)
Sodium: 137 mmol/L (ref 135–145)

## 2016-10-07 LAB — CBC
HCT: 30.1 % — ABNORMAL LOW (ref 36.0–46.0)
HEMOGLOBIN: 9.7 g/dL — AB (ref 12.0–15.0)
MCH: 25.7 pg — AB (ref 26.0–34.0)
MCHC: 32.2 g/dL (ref 30.0–36.0)
MCV: 79.8 fL (ref 78.0–100.0)
PLATELETS: 115 10*3/uL — AB (ref 150–400)
RBC: 3.77 MIL/uL — AB (ref 3.87–5.11)
RDW: 17.2 % — ABNORMAL HIGH (ref 11.5–15.5)
WBC: 5.5 10*3/uL (ref 4.0–10.5)

## 2016-10-07 MED ORDER — PREDNISONE 50 MG PO TABS
60.0000 mg | ORAL_TABLET | Freq: Every day | ORAL | Status: DC
  Administered 2016-10-07 – 2016-10-14 (×8): 60 mg via ORAL
  Filled 2016-10-07 (×8): qty 1

## 2016-10-07 MED ORDER — ALTEPLASE 2 MG IJ SOLR
INTRAMUSCULAR | Status: AC
  Administered 2016-10-07: 2 mg
  Filled 2016-10-07: qty 2

## 2016-10-07 MED ORDER — ALTEPLASE 2 MG IJ SOLR
INTRAMUSCULAR | Status: AC
  Administered 2016-10-07: 2 mg
  Filled 2016-10-07: qty 4

## 2016-10-07 NOTE — Progress Notes (Signed)
Subjective: Interval History: has no complaint, making urine.  Objective: Vital signs in last 24 hours: Temp:  [98.3 F (36.8 C)-98.7 F (37.1 C)] 98.5 F (36.9 C) (01/30 1000) Pulse Rate:  [74-90] 74 (01/30 1000) Resp:  [17-18] 18 (01/30 1000) BP: (92-129)/(49-66) 129/49 (01/30 1000) SpO2:  [95 %-98 %] 98 % (01/30 1000) Weight change:   Intake/Output from previous day: 01/29 0701 - 01/30 0700 In: 720 [P.O.:720] Out: 2350 [Urine:2350] Intake/Output this shift: Total I/O In: 240 [P.O.:240] Out: -   General appearance: alert, cooperative, no distress, moderately obese and pale Neck: RIJ cath Resp: clear to auscultation bilaterally Cardio: S1, S2 normal and systolic murmur: holosystolic 2/6, blowing at apex GI: obese, pos bs, soft, liver down 4 cm Extremities: edema 1+  Lab Results:  Recent Labs  10/04/16 1456 10/05/16 0541  WBC 7.4 6.9  HGB 9.9* 9.5*  HCT 31.1* 30.4*  PLT 124* 126*   BMET:  Recent Labs  10/06/16 0527 10/07/16 0608  NA 139 137  K 4.0 3.5  CL 108 106  CO2 23 20*  GLUCOSE 94 104*  BUN 29* 36*  CREATININE 5.61* 6.82*  CALCIUM 8.7* 8.6*   No results for input(s): PTH in the last 72 hours. Iron Studies: No results for input(s): IRON, TIBC, TRANSFERRIN, FERRITIN in the last 72 hours.  Studies/Results: No results found.  I have reviewed the patient's current medications. Prior to Admission:  Prescriptions Prior to Admission  Medication Sig Dispense Refill Last Dose  . atorvastatin (LIPITOR) 40 MG tablet Take 40 mg by mouth daily.    Past Week at Unknown time  . citalopram (CELEXA) 40 MG tablet Take 40 mg by mouth daily.   Past Week at Unknown time  . clopidogrel (PLAVIX) 75 MG tablet Take 75 mg by mouth daily.   Past Week at Unknown time  . levothyroxine (SYNTHROID, LEVOTHROID) 100 MCG tablet Take 100 mcg by mouth daily before breakfast.   Past Week at Unknown time  . pantoprazole (PROTONIX) 40 MG tablet Take 40 mg by mouth daily.   Past  Week at Unknown time    Assessment/Plan: 1 AKI/CKD3 Bx show Interstitial Nephritis with some atherosclerotic changes but signif functional tiss.  Nonoliguric but no clearance , will do HD. Most likely offender, Pantoprozole 2 Hypothyroid 3 Anemia 4 Obesity 5 Chronic pancreatitis 6 GERD use H2 blocker  P Pred, Hd, stop PPI   LOS: 15 days   Batul Diego L 10/07/2016,11:57 AM

## 2016-10-07 NOTE — Progress Notes (Signed)
Patient in hemodialysis unit for dialysis treatment.  HD catheter ports aspirated poorly very poor.  Dr. Jimmy Footman notified and orders received to TPA for 2 hours and then resume dialysis.

## 2016-10-07 NOTE — Progress Notes (Signed)
[facility] PROGRESS NOTE  Rachael [name] AJO:[id number] DOB: Aug 06, 1956 DOA: 09/22/2016  PCP: No primary care provider on file.  Brief History/Interval Summary: 61 year old female with a past medical history of chronic kidney disease, hypertension, hypothyroidism, presented with nausea, vomiting. Patient was found to have acute kidney injury. She was transferred to this facility from [facility]. Patient to be started on dialysis. She underwent Renal biopsy 1/25. Plan is for dialysis today.  Reason for Visit: Acute kidney injury  Consultants: Nephrology  Procedures:  Hemodialysis  Renal biopsy 1/25  Antibiotics: None  Subjective/Interval History: Patient feels well. Denies any complaints today. No further episodes of nausea or vomiting.   ROS: Denies any chest pain or shortness of breath.  Objective:  Vital Signs  Vitals:   10/06/16 0938 10/06/16 1831 10/06/16 2200 10/07/16 0600  BP: (!) 130/50 106/66 118/64 92/62  Pulse: 84 90 88 83  Resp: 18 18 18 17   Temp: 98.4 F (36.9 C) 98.3 F (36.8 C) 98.7 F (37.1 C) 98.4 F (36.9 C)  TempSrc: Oral Oral Oral Oral  SpO2: 95% 95% 96% 96%  Weight:      Height:        Intake/Output Summary (Last 24 hours) at 10/07/16 0954 Last data filed at 10/06/16 2300  Gross per 24 hour  Intake              480 ml  Output             2350 ml  Net            -1870 ml   Filed Weights   10/03/16 2101 10/04/16 1400 10/04/16 1755  Weight: 102.5 kg (226 lb) 101.8 kg (224 lb 6.9 oz) 101.6 kg (223 lb 15.8 oz)    General appearance: alert, cooperative, appears stated age and no distress Resp: clear to auscultation bilaterally Cardio: regular rate and rhythm, S1, S2 normal, no murmur, click, rub or gallop GI: Abdomen is soft. Nontender. Nondistended. No masses or organomegaly. Bowel sounds are present.  Neurologic: No focal deficits  Lab Results:  Data Reviewed: I have personally reviewed following labs and imaging  studies  CBC:  Recent Labs Lab 10/01/16 0616 10/02/16 1516 10/03/16 0507 10/04/16 1456 10/05/16 0541  WBC 7.2 7.1 7.5 7.4 6.9  HGB 10.4* 10.4* 10.1* 9.9* 9.5*  HCT 33.3* 33.0* 31.8* 31.1* 30.4*  MCV 79.9 79.9 80.3 79.9 80.0  PLT 146* 128* 128* 124* 126*    Basic Metabolic Panel:  Recent Labs Lab 10/03/16 0507 10/04/16 0530 10/05/16 0541 10/06/16 0527 10/07/16 0608  NA 138 138 138 139 137  K 3.5 3.4* 3.4* 4.0 3.5  CL 105 106 105 108 106  CO2 23 21* 25 23 20*  GLUCOSE 101* 97 98 94 104*  BUN 38* 43* 17 29* 36*  CREATININE 6.12* 6.98* 4.07* 5.61* 6.82*  CALCIUM 8.7* 8.7* 8.2* 8.7* 8.6*  PHOS 4.9* 5.5* 2.3* 4.2 4.4    GFR: Estimated Creatinine Clearance: 9.5 mL/min (by C-G formula based on SCr of 6.82 mg/dL (H)).  Liver Function Tests:  Recent Labs Lab 10/03/16 0507 10/04/16 0530 10/05/16 0541 10/06/16 0527 10/07/16 9470  ALBUMIN 2.8* 2.8* 2.8* 2.9* 3.0*      Radiology Studies: No results found.   Medications:  Scheduled: . citalopram  20 mg Oral Daily  . clopidogrel  75 mg Oral Daily  . famotidine  20 mg Oral Daily  . heparin  5,000 Units Subcutaneous Q8H  . levothyroxine  100 mcg  Oral QAC breakfast  . metoCLOPramide  10 mg Oral TID AC  . multivitamin  1 tablet Oral QHS  . pantoprazole  40 mg Oral Q1200  . polyethylene glycol  17 g Oral Daily  . senna-docusate  1 tablet Oral BID  . tamsulosin  0.4 mg Oral Daily   Continuous:  FUX:NATFTD chloride, sodium chloride, ALPRAZolam, alteplase, heparin, lidocaine (PF), lidocaine-prilocaine, ondansetron (ZOFRAN) IV, pentafluoroprop-tetrafluoroeth, promethazine **OR** promethazine **OR** promethazine  Assessment/Plan:  Principal Problem:   Acute renal failure superimposed on chronic kidney disease ([place]) Active Problems:   UTI (urinary tract infection)   Nausea and vomiting   HTN (hypertension)   Hypothyroidism   AKI (acute kidney injury) ([place])    Acute renal failure superimposed on chronic  kidney disease Patient had chronic kidney disease stage III. Presented with acute renal failure. Etiology is unclear so far. Nephrology is following. Patient underwent renal biopsy 1/25. She was briefly on IV antibiotics. Cultures are negative. Dialysis per nephrology. Last dialyzed 1/27. ADDENDUM: Apparently the biopsy shows interstitial nephritis. It is felt that the offender could be pantoprazole. And this has been discontinued. Prednisone has been initiated.   Hypothyroidism. Continue Synthroid 100 mcg daily  Recurrent nausea and vomiting Patient has significantly improved. No vomiting in the last 3 days. CT abdomen in other facility 09/19/2016 shows no acute abnormalities other than hiatal hernia. Ultrasound abdomen 09/21/2016 shows no evidence of obstruction, no hydronephrosis, bilateral renal size roughly 9 cm. Ultrasound repeated. No obvious abnormalities noted. She is status post cholecystectomy. Renal failure and dialysis could be contributing. Dose of Reglan was increased 1/26. Due to her history of GERD and persistent nausea and vomiting Protonix was reinitiated on 1/28, but in view of the pathology findings with Protonix being the likely culprit for her renal failure, this will be stopped.   Orthostatic hypotension Perhaps due to excessive volume being pulled during dialysis. TED stockings for now.  History of stroke Stable. Plavix was placed on hold for renal biopsy. Plavix reinitiated after discussions with Dr. Kathlene Cote 1/26.   Normocytic anemia Hemoglobin has been stable. Likely due to anemia of chronic disease. Hemoglobin is stable.  GERD. Stable. Stop PPI as discussed above.  DVT Prophylaxis: SubCutaneous heparin Code Status: Full code  Family Communication: Discussed with patient  Disposition Plan: Management as outlined above. Mobilize. PT evaluation.    LOS: 15 days   [facility] Pager [phone] 10/07/2016, 9:54 AM  If 7PM-7AM, please  contact night-coverage at [email or web address], password [facility]

## 2016-10-08 DIAGNOSIS — N179 Acute kidney failure, unspecified: Principal | ICD-10-CM

## 2016-10-08 DIAGNOSIS — I10 Essential (primary) hypertension: Secondary | ICD-10-CM

## 2016-10-08 DIAGNOSIS — N170 Acute kidney failure with tubular necrosis: Secondary | ICD-10-CM

## 2016-10-08 DIAGNOSIS — N183 Chronic kidney disease, stage 3 (moderate): Secondary | ICD-10-CM

## 2016-10-08 DIAGNOSIS — N3000 Acute cystitis without hematuria: Secondary | ICD-10-CM

## 2016-10-08 DIAGNOSIS — R112 Nausea with vomiting, unspecified: Secondary | ICD-10-CM

## 2016-10-08 LAB — RENAL FUNCTION PANEL
ALBUMIN: 2.9 g/dL — AB (ref 3.5–5.0)
ANION GAP: 10 (ref 5–15)
BUN: 37 mg/dL — ABNORMAL HIGH (ref 6–20)
CO2: 20 mmol/L — ABNORMAL LOW (ref 22–32)
Calcium: 8.8 mg/dL — ABNORMAL LOW (ref 8.9–10.3)
Chloride: 105 mmol/L (ref 101–111)
Creatinine, Ser: 6.64 mg/dL — ABNORMAL HIGH (ref 0.44–1.00)
GFR calc Af Amer: 7 mL/min — ABNORMAL LOW (ref >60)
GFR calc non Af Amer: 6 mL/min — ABNORMAL LOW (ref >60)
GLUCOSE: 216 mg/dL — AB (ref 65–99)
Phosphorus: 4.1 mg/dL (ref 2.5–4.6)
Potassium: 4.2 mmol/L (ref 3.5–5.1)
SODIUM: 135 mmol/L (ref 135–145)

## 2016-10-08 LAB — PTH, INTACT AND CALCIUM
Calcium, Total (PTH): 8.7 mg/dL (ref 8.7–10.3)
PTH: 111 pg/mL — AB (ref 15–65)

## 2016-10-08 NOTE — Progress Notes (Signed)
PT Cancellation Note  Patient Details Name: Rachael [name] MRN: [id number] DOB: 06/15/1956   Cancelled Treatment:    Reason Eval/Treat Not Completed: Medical issues which prohibited therapy.  Pt nauseated with phenergan.  Will see tomorrow as able. 10/08/2016  Rachael [name], [place] [phone]  (pager)   Rachael [name] 10/08/2016, 6:02 PM

## 2016-10-08 NOTE — Progress Notes (Signed)
[facility] PROGRESS NOTE  Rachael [name] NLG:[id number] DOB: 1956/05/30 DOA: 09/22/2016  PCP: No primary care provider on file.   Subjective/Interval History: CO2 is having dialysis earlier today, denies any new complaints.  Brief History/Interval Summary: 61 year old female with a past medical history of chronic kidney disease, hypertension, hypothyroidism, presented with nausea, vomiting. Patient was found to have acute kidney injury. She was transferred to this facility from [facility]. Patient to be started on dialysis. She underwent Renal biopsy 1/25. Plan is for dialysis today.  Reason for Visit: Acute kidney injury  Consultants: Nephrology  Procedures:  Hemodialysis  Renal biopsy 1/25  Antibiotics: None   ROS: Denies any chest pain or shortness of breath.  Objective:  Vital Signs  Vitals:   10/08/16 1030 10/08/16 1100 10/08/16 1130 10/08/16 1145  BP: 132/79 123/74 122/74 117/78  Pulse: 95 92 89 91  Resp: 16 16 16 16   Temp:    97.1 F (36.2 C)  TempSrc:    Oral  SpO2:      Weight:    102 kg (224 lb 13.9 oz)  Height:        Intake/Output Summary (Last 24 hours) at 10/08/16 1305 Last data filed at 10/08/16 1145  Gross per 24 hour  Intake              240 ml  Output             1725 ml  Net            -1485 ml   Filed Weights   10/07/16 2154 10/08/16 0720 10/08/16 1145  Weight: 101.6 kg (224 lb) 102 kg (224 lb 13.9 oz) 102 kg (224 lb 13.9 oz)    General appearance: alert, cooperative, appears stated age and no distress Resp: clear to auscultation bilaterally Cardio: regular rate and rhythm, S1, S2 normal, no murmur, click, rub or gallop GI: Abdomen is soft. Nontender. Nondistended. No masses or organomegaly. Bowel sounds are present.  Neurologic: No focal deficits  Lab Results:  Data Reviewed: I have personally reviewed following labs and imaging studies  CBC:  Recent Labs Lab 10/02/16 1516 10/03/16 0507 10/04/16 1456  10/05/16 0541 10/07/16 1418  WBC 7.1 7.5 7.4 6.9 5.5  HGB 10.4* 10.1* 9.9* 9.5* 9.7*  HCT 33.0* 31.8* 31.1* 30.4* 30.1*  MCV 79.9 80.3 79.9 80.0 79.8  PLT 128* 128* 124* 126* 115*    Basic Metabolic Panel:  Recent Labs Lab 10/04/16 0530 10/05/16 0541 10/06/16 0527 10/07/16 0608 10/07/16 1418 10/08/16 0607  NA 138 138 139 137  --  135  K 3.4* 3.4* 4.0 3.5  --  4.2  CL 106 105 108 106  --  105  CO2 21* 25 23 20*  --  20*  GLUCOSE 97 98 94 104*  --  216*  BUN 43* 17 29* 36*  --  37*  CREATININE 6.98* 4.07* 5.61* 6.82*  --  6.64*  CALCIUM 8.7* 8.2* 8.7* 8.6* 8.7 8.8*  PHOS 5.5* 2.3* 4.2 4.4  --  4.1    GFR: Estimated Creatinine Clearance: 9.8 mL/min (by C-G formula based on SCr of 6.64 mg/dL (H)).  Liver Function Tests:  Recent Labs Lab 10/04/16 0530 10/05/16 0541 10/06/16 0527 10/07/16 0608 10/08/16 0607  ALBUMIN 2.8* 2.8* 2.9* 3.0* 2.9*      Radiology Studies: No results found.   Medications:  Scheduled: . citalopram  20 mg Oral Daily  . clopidogrel  75 mg Oral Daily  . famotidine  20 mg Oral Daily  . heparin  5,000 Units Subcutaneous Q8H  . levothyroxine  100 mcg Oral QAC breakfast  . metoCLOPramide  10 mg Oral TID AC  . multivitamin  1 tablet Oral QHS  . polyethylene glycol  17 g Oral Daily  . predniSONE  60 mg Oral Q breakfast  . senna-docusate  1 tablet Oral BID  . tamsulosin  0.4 mg Oral Daily   Continuous:  MEQ:ASTMHD chloride, sodium chloride, ALPRAZolam, heparin, lidocaine (PF), lidocaine-prilocaine, ondansetron (ZOFRAN) IV, pentafluoroprop-tetrafluoroeth, promethazine **OR** promethazine **OR** promethazine  Assessment/Plan:  Principal Problem:   Acute renal failure superimposed on chronic kidney disease ([facility]) Active Problems:   UTI (urinary tract infection)   Nausea and vomiting   HTN (hypertension)   Hypothyroidism   AKI (acute kidney injury) ([place])    Acute renal failure superimposed on chronic kidney disease -Patient had  chronic kidney disease stage III. No recent baseline of creatinine. -Presented with acute renal failure. Etiology is unclear so far. Nephrology is following.  -Patient underwent renal biopsy 1/25 and showed interstitial nephritis. -Per nephrology this could be secondary to pantoprazole, this is discontinued, patient started on prednisone.  Hypothyroidism. Continue Synthroid 100 mcg daily  Recurrent nausea and vomiting Patient has significantly improved. No vomiting in the last 3 days. CT abdomen in other facility 09/19/2016 shows no acute abnormalities other than hiatal hernia. Ultrasound abdomen 09/21/2016 shows no evidence of obstruction, no hydronephrosis, bilateral renal size roughly 9 cm. Ultrasound repeated. No obvious abnormalities noted. She is status post cholecystectomy. Renal failure and dialysis could be contributing. Dose of Reglan was increased 1/26. Due to her history of GERD and persistent nausea and vomiting Protonix was reinitiated on 1/28, but in view of the pathology findings with Protonix being the likely culprit for her renal failure, this will be stopped.  started on  Orthostatic hypotension Perhaps due to excessive volume being pulled during dialysis. TED stockings for now.  History of stroke Stable. Plavix was placed on hold for renal biopsy. Plavix reinitiated after discussions with Dr. Kathlene Cote 1/26.   Normocytic anemia Hemoglobin has been stable. Likely due to anemia of chronic disease. Hemoglobin is stable.  GERD. Stable. Stop PPI as discussed above.  DVT Prophylaxis: SubCutaneous heparin Code Status: Full code  Family Communication: Discussed with patient  Disposition Plan: Management as outlined above. Mobilize. PT evaluation.    LOS: 16 days   [facility] Pager [phone] 10/08/2016, 1:05 PM  If 7PM-7AM, please contact night-coverage at [email or web address], password [facility]

## 2016-10-08 NOTE — Progress Notes (Signed)
OT Cancellation Note  Patient Details Name: Rachael [name] MRN: [id number] DOB: Dec 21, 1955   Cancelled Treatment:    Reason Eval/Treat Not Completed: Patient at procedure or test/ unavailable (HD)  Parke Poisson B 10/08/2016, 9:58 AM

## 2016-10-08 NOTE — Care Management Important Message (Signed)
Important Message  Patient Details  Name: Rachael [name] MRN: [id number] Date of Birth: [birth year]   Medicare Important Message Given:  Yes    Keimya Briddell Montine Circle 10/08/2016, 12:43 PM

## 2016-10-08 NOTE — Progress Notes (Signed)
Subjective: Interval History: has no complaint .  Objective: Vital signs in last 24 hours: Temp:  [97 F (36.1 C)-98.6 F (37 C)] 97 F (36.1 C) (01/31 0743) Pulse Rate:  [74-111] 89 (01/31 0830) Resp:  [15-18] 15 (01/31 0830) BP: (122-135)/(49-90) 122/80 (01/31 0830) SpO2:  [95 %-98 %] 98 % (01/31 0738) Weight:  [101.6 kg (224 lb)-102 kg (224 lb 13.9 oz)] 102 kg (224 lb 13.9 oz) (01/31 0720) Weight change:   Intake/Output from previous day: 01/30 0701 - 01/31 0700 In: 480 [P.O.:480] Out: 2076 [Urine:2075; Emesis/NG output:1] Intake/Output this shift: No intake/output data recorded.  General appearance: alert, cooperative, no distress and moderately obese Neck: RIJ cath Resp: clear to auscultation bilaterally Cardio: S1, S2 normal and systolic murmur: holosystolic 2/6, blowing at apex GI: obese, pos bs, soft,  Extremities: extremities normal, atraumatic, no cyanosis or edema  Lab Results:  Recent Labs  10/07/16 1418  WBC 5.5  HGB 9.7*  HCT 30.1*  PLT 115*   BMET:  Recent Labs  10/07/16 0608 10/08/16 0607  NA 137 135  K 3.5 4.2  CL 106 105  CO2 20* 20*  GLUCOSE 104* 216*  BUN 36* 37*  CREATININE 6.82* 6.64*  CALCIUM 8.6* 8.8*   No results for input(s): PTH in the last 72 hours. Iron Studies:  Recent Labs  10/07/16 1418  IRON 44  TIBC 265    Studies/Results: No results found.  I have reviewed the patient's current medications.  Assessment/Plan: 1 AKI AIN, on bx. On Pred. Still HD dependent. Follow preHd Crs.  Nonoligurid 2 Anemia  3 Chronic pancreatitis 4 Obesity 5 ^ Glu from Pred P HD, esa, Pred, follow Cr, glu    LOS: 16 days   Shiva Sahagian L 10/08/2016,9:17 AM

## 2016-10-08 NOTE — Procedures (Signed)
I was present at this session.  I have reviewed the session itself and made appropriate changes. Cath TPA x 2,now 350 flow.  bp low 100s tol well.    Darryon Bastin L 1/31/20189:16 AM

## 2016-10-09 DIAGNOSIS — E039 Hypothyroidism, unspecified: Secondary | ICD-10-CM

## 2016-10-09 LAB — RENAL FUNCTION PANEL
ALBUMIN: 3.1 g/dL — AB (ref 3.5–5.0)
ANION GAP: 10 (ref 5–15)
BUN: 23 mg/dL — ABNORMAL HIGH (ref 6–20)
CALCIUM: 8.7 mg/dL — AB (ref 8.9–10.3)
CO2: 25 mmol/L (ref 22–32)
CREATININE: 3.88 mg/dL — AB (ref 0.44–1.00)
Chloride: 104 mmol/L (ref 101–111)
GFR calc Af Amer: 13 mL/min — ABNORMAL LOW (ref >60)
GFR calc non Af Amer: 11 mL/min — ABNORMAL LOW (ref >60)
GLUCOSE: 149 mg/dL — AB (ref 65–99)
PHOSPHORUS: 4 mg/dL (ref 2.5–4.6)
Potassium: 3.5 mmol/L (ref 3.5–5.1)
SODIUM: 139 mmol/L (ref 135–145)

## 2016-10-09 NOTE — Progress Notes (Signed)
Physical Therapy Treatment Patient Details Name: Rachael [name] MRN: [id number] DOB: May 21, 1956 Today's Date: 10/09/2016    History of Present Illness  61 year old female with a past medical history of chronic kidney disease, hypertension, hypothyroidism, presented with nausea, vomiting. Patient was found to have acute kidney injury. She was transferred to this facility from [facility]. Patient to be started on dialysis. She underwent Renal biopsy 1/25    PT Comments    For the first time up with no s/s of orthostatic BP, pt was likely fairly close to her baseline function with some general weakness.  Pt still wary or dizziness/presyncope, but none of these happened today.   Follow Up Recommendations  No PT follow up;Supervision - Intermittent     Equipment Recommendations  None recommended by PT    Recommendations for Other Services       Precautions / Restrictions Precautions Precautions: Fall    Mobility  Bed Mobility Overal bed mobility: Independent                Transfers Overall transfer level: Modified independent   Transfers: Sit to/from Stand Sit to Stand: Modified independent (Device/Increase time)         General transfer comment: stood appropriately with hand on stationary object to see if her dizziness returned  Ambulation/Gait Ambulation/Gait assistance: Supervision Ambulation Distance (Feet): 200 Feet Assistive device: Rolling walker (2 wheeled) Gait Pattern/deviations: Step-through pattern   Gait velocity interpretation: at or above normal speed for age/gender General Gait Details: steady with good quality gait.  Slower   Stairs            Wheelchair Mobility    Modified Rankin (Stroke Patients Only)       Balance Overall balance assessment: Needs assistance   Sitting balance-Leahy Scale: Good       Standing balance-Leahy Scale: Good                      Cognition Arousal/Alertness: Awake/alert Behavior  During Therapy: WFL for tasks assessed/performed Overall Cognitive Status: Within Functional Limits for tasks assessed                      Exercises      General Comments        Pertinent Vitals/Pain Pain Assessment: No/denies pain    Home Living                      Prior Function            PT Goals (current goals can now be found in the care plan section) Acute Rehab PT Goals Patient Stated Goal: Return home PT Goal Formulation: With patient Time For Goal Achievement: 10/12/16 Potential to Achieve Goals: Good Progress towards PT goals: Progressing toward goals    Frequency    Min 3X/week      PT Plan Current plan remains appropriate    Co-evaluation             End of Session   Activity Tolerance: Patient tolerated treatment well Patient left: with call bell/phone within reach;in chair;with chair alarm set     Time: 1136-1150 PT Time Calculation (min) (ACUTE ONLY): 14 min  Charges:  $Gait Training: 8-22 mins                    G CodesTessie Fass [name] 10/09/2016, 12:27 PM 10/09/2016  Donnella Sham, PT  [phone]  (pager)

## 2016-10-09 NOTE — Progress Notes (Signed)
Occupational Therapy Treatment Patient Details Name: Rachael [name] MRN: [id number] DOB: 04/26/1956 Today's Date: 10/09/2016    History of present illness  61 year old female with a past medical history of chronic kidney disease, hypertension, hypothyroidism, presented with nausea, vomiting. Patient was found to have acute kidney injury. She was transferred to this facility from [facility]. Patient to be started on dialysis. She underwent Renal biopsy 1/25   OT comments  Pt without symptoms of hypotension this visit with ambulation, toileting and standing activity at sink. Overall, performing at a modified independent level. Pt reports feeling well and is eager to go home.  Follow Up Recommendations  No OT follow up    Equipment Recommendations  None recommended by OT    Recommendations for Other Services      Precautions / Restrictions Precautions Precautions: Fall       Mobility Bed Mobility Overal bed mobility: Independent             General bed mobility comments: bed flat  Transfers Overall transfer level: Modified independent   Transfers: Sit to/from Stand Sit to Stand: Modified independent (Device/Increase time)         General transfer comment: cued stand momentarily and assess symptoms of hypotension    Balance Overall balance assessment: Needs assistance   Sitting balance-Leahy Scale: Good       Standing balance-Leahy Scale: Good                     ADL Overall ADL's : Needs assistance/impaired Eating/Feeding: Independent   Grooming: Standing;Wash/dry hands;Modified independent           Upper Body Dressing : Set up;Sitting Upper Body Dressing Details (indicate cue type and reason): doffed back opening gown Lower Body Dressing: Modified independent Lower Body Dressing Details (indicate cue type and reason): don socks Toilet Transfer: Modified Independent;Ambulation;Regular Toilet   Toileting- Clothing Manipulation and  Hygiene: Modified independent;Sit to/from stand         General ADL Comments: Pt without hypotension today.       Vision                     Perception     Praxis      Cognition   Behavior During Therapy: WFL for tasks assessed/performed Overall Cognitive Status: Within Functional Limits for tasks assessed                       Extremity/Trunk Assessment               Exercises     Shoulder Instructions       General Comments      Pertinent Vitals/ Pain       Pain Assessment: No/denies pain  Home Living                                          Prior Functioning/Environment              Frequency           Progress Toward Goals  OT Goals(current goals can now be found in the care plan section)  Progress towards OT goals: Goals met/education completed, patient discharged from OT  Acute Rehab OT Goals Patient Stated Goal: Return home Time For Goal Achievement: 10/17/16 Potential to Achieve Goals: Good  Plan All goals met  and education completed, patient discharged from OT services    Co-evaluation                 End of Session     Activity Tolerance Patient tolerated treatment well   Patient Left in bed;with call bell/phone within reach;with family/visitor present   Nurse Communication          Time: 9037-9558 OT Time Calculation (min): 15 min  Charges: OT General Charges $OT Visit: 1 Procedure OT Treatments $Self Care/Home Management : 8-22 mins  Malka So 10/09/2016, 2:40 PM  [phone]

## 2016-10-09 NOTE — Progress Notes (Signed)
Subjective: Interval History: Sleeping comfortably this morning. No complaints.   Objective: Vital signs in last 24 hours: Temp:  [97.1 F (36.2 C)-98.6 F (37 C)] 98.5 F (36.9 C) (02/01 0500) Pulse Rate:  [80-95] 80 (02/01 0500) Resp:  [16-20] 20 (02/01 0500) BP: (107-132)/(64-81) 107/64 (02/01 0500) SpO2:  [93 %-96 %] 95 % (02/01 0500) Weight:  [224 lb 13.9 oz (102 kg)-225 lb 12 oz (102.4 kg)] 225 lb 12 oz (102.4 kg) (01/31 2128) Weight change: 13.9 oz (0.394 kg)  Intake/Output from previous day: 01/31 0701 - 02/01 0700 In: 1200 [P.O.:1200] Out: 1350 [Urine:1350] Intake/Output this shift: No intake/output data recorded.  General appearance: alert, cooperative, no distress and moderately obese Neck: RIJ cath Resp: clear to auscultation bilaterally Cardio: S1, S2 normal and systolic murmur: holosystolic 2/6, blowing at apex GI: obese, pos bs, soft,  Extremities: extremities normal, atraumatic, 1+ edema with compression hose in place   Lab Results:  Recent Labs  10/07/16 1418  WBC 5.5  HGB 9.7*  HCT 30.1*  PLT 115*   BMET:   Recent Labs  10/08/16 0607 10/09/16 0536  NA 135 139  K 4.2 3.5  CL 105 104  CO2 20* 25  GLUCOSE 216* 149*  BUN 37* 23*  CREATININE 6.64* 3.88*  CALCIUM 8.8* 8.7*    Recent Labs  10/07/16 1418  PTH 111*  Comment   Iron Studies:   Recent Labs  10/07/16 1418  IRON 44  TIBC 265    Studies/Results: No results found.  I have reviewed the patient's current medications.  Assessment/Plan: 1 Acute on CKD 2/2 to AIN most likely offending agent PPI. Nonoliguric. Significant improvement in Cr from 6.64 >> 3.88.  On Prednisone day three. Continue HD.  2 Anemia. Giving ESA.  3 Chronic pancreatitis 4 Obesity 5 ^ Glu from Pred 6. GERD. Using H2 blocker given PPI induced AIN.  7. Thrombocytopenia    LOS: 17 days   [name] 10/09/2016,8:53 AM I have seen and examined this patient and agree with the plan of care see  my note .  Ermina Oberman L 10/10/2016, 4:26 PM

## 2016-10-09 NOTE — Progress Notes (Signed)
Subjective: Interval History: has no complaint .  Objective: Vital signs in last 24 hours: Temp:  [97.1 F (36.2 C)-98.6 F (37 C)] 98.5 F (36.9 C) (02/01 0500) Pulse Rate:  [80-92] 80 (02/01 0500) Resp:  [16-20] 20 (02/01 0500) BP: (107-124)/(64-78) 107/64 (02/01 0500) SpO2:  [93 %-96 %] 95 % (02/01 0500) Weight:  [102 kg (224 lb 13.9 oz)-102.4 kg (225 lb 12 oz)] 102.4 kg (225 lb 12 oz) (01/31 2128) Weight change: 0.394 kg (13.9 oz)  Intake/Output from previous day: 01/31 0701 - 02/01 0700 In: 1200 [P.O.:1200] Out: 1350 [Urine:1350] Intake/Output this shift: No intake/output data recorded.  General appearance: alert, cooperative, no distress and moderately obese Neck: RIJ cath Resp: clear to auscultation bilaterally Cardio: S1, S2 normal and systolic murmur: holosystolic 2/6, blowing at apex GI: obese, pos bs, soft Extremities: obese, soft, pos bs,   extrem  No signif edema  Lab Results:  Recent Labs  10/07/16 1418  WBC 5.5  HGB 9.7*  HCT 30.1*  PLT 115*   BMET:  Recent Labs  10/08/16 0607 10/09/16 0536  NA 135 139  K 4.2 3.5  CL 105 104  CO2 20* 25  GLUCOSE 216* 149*  BUN 37* 23*  CREATININE 6.64* 3.88*  CALCIUM 8.8* 8.7*    Recent Labs  10/07/16 1418  PTH 111*  Comment   Iron Studies:  Recent Labs  10/07/16 1418  IRON 44  TIBC 265    Studies/Results: No results found.  I have reviewed the patient's current medications.  Assessment/Plan: 1 AKI nonoliguric AIN on Pred.  Hopefully will recover 2 Obesity 3 ^BS from Pred 4 Chronic pancreatitis P check chem in am , decide about HD, cont Pred    LOS: 17 days   Zilphia Kozinski L 10/09/2016,10:41 AM

## 2016-10-09 NOTE — Progress Notes (Signed)
[facility] PROGRESS NOTE  Rachael [name] EVO:[id number] DOB: 07-30-1956 DOA: 09/22/2016  PCP: No primary care provider on file.   Subjective/Interval History: Denies any complaints this morning, not ambulating as supposed to be secondary to orthostatic hypotension  Brief History/Interval Summary: 61 year old female with a past medical history of chronic kidney disease, hypertension, hypothyroidism, presented with nausea, vomiting. Patient was found to have acute kidney injury. She was transferred to this facility from [facility]. Patient to be started on dialysis. She underwent Renal biopsy 1/25. Plan is for dialysis today.  Reason for Visit: Acute kidney injury  Consultants: Nephrology  Procedures:  Hemodialysis  Renal biopsy 1/25  Antibiotics: None   ROS: Denies any chest pain or shortness of breath.  Objective:  Vital Signs  Vitals:   10/08/16 1145 10/08/16 1749 10/08/16 2128 10/09/16 0500  BP: 117/78 117/65 124/73 107/64  Pulse: 91 87 90 80  Resp: 16 18 19 20   Temp: 97.1 F (36.2 C) 98.2 F (36.8 C) 98.6 F (37 C) 98.5 F (36.9 C)  TempSrc: Oral Oral Oral Oral  SpO2:  96% 93% 95%  Weight: 102 kg (224 lb 13.9 oz)  102.4 kg (225 lb 12 oz)   Height:        Intake/Output Summary (Last 24 hours) at 10/09/16 1149 Last data filed at 10/09/16 0600  Gross per 24 hour  Intake             1200 ml  Output             1350 ml  Net             -150 ml   Filed Weights   10/08/16 0720 10/08/16 1145 10/08/16 2128  Weight: 102 kg (224 lb 13.9 oz) 102 kg (224 lb 13.9 oz) 102.4 kg (225 lb 12 oz)    General appearance: alert, cooperative, appears stated age and no distress Resp: clear to auscultation bilaterally Cardio: regular rate and rhythm, S1, S2 normal, no murmur, click, rub or gallop GI: Abdomen is soft. Nontender. Nondistended. No masses or organomegaly. Bowel sounds are present.  Neurologic: No focal deficits  Lab Results:  Data Reviewed: I  have personally reviewed following labs and imaging studies  CBC:  Recent Labs Lab 10/02/16 1516 10/03/16 0507 10/04/16 1456 10/05/16 0541 10/07/16 1418  WBC 7.1 7.5 7.4 6.9 5.5  HGB 10.4* 10.1* 9.9* 9.5* 9.7*  HCT 33.0* 31.8* 31.1* 30.4* 30.1*  MCV 79.9 80.3 79.9 80.0 79.8  PLT 128* 128* 124* 126* 115*    Basic Metabolic Panel:  Recent Labs Lab 10/05/16 0541 10/06/16 0527 10/07/16 0608 10/07/16 1418 10/08/16 0607 10/09/16 0536  NA 138 139 137  --  135 139  K 3.4* 4.0 3.5  --  4.2 3.5  CL 105 108 106  --  105 104  CO2 25 23 20*  --  20* 25  GLUCOSE 98 94 104*  --  216* 149*  BUN 17 29* 36*  --  37* 23*  CREATININE 4.07* 5.61* 6.82*  --  6.64* 3.88*  CALCIUM 8.2* 8.7* 8.6* 8.7 8.8* 8.7*  PHOS 2.3* 4.2 4.4  --  4.1 4.0    GFR: Estimated Creatinine Clearance: 16.9 mL/min (by C-G formula based on SCr of 3.88 mg/dL (H)).  Liver Function Tests:  Recent Labs Lab 10/05/16 0541 10/06/16 0527 10/07/16 0608 10/08/16 0607 10/09/16 0536  ALBUMIN 2.8* 2.9* 3.0* 2.9* 3.1*      Radiology Studies: No results found.  Medications:  Scheduled: . citalopram  20 mg Oral Daily  . clopidogrel  75 mg Oral Daily  . famotidine  20 mg Oral Daily  . heparin  5,000 Units Subcutaneous Q8H  . levothyroxine  100 mcg Oral QAC breakfast  . metoCLOPramide  10 mg Oral TID AC  . multivitamin  1 tablet Oral QHS  . polyethylene glycol  17 g Oral Daily  . predniSONE  60 mg Oral Q breakfast  . senna-docusate  1 tablet Oral BID  . tamsulosin  0.4 mg Oral Daily   Continuous:  SWN:IOEVOJ chloride, sodium chloride, ALPRAZolam, heparin, lidocaine (PF), lidocaine-prilocaine, ondansetron (ZOFRAN) IV, pentafluoroprop-tetrafluoroeth, promethazine **OR** promethazine **OR** promethazine  Assessment/Plan:  Principal Problem:   Acute renal failure superimposed on chronic kidney disease (HCC) Active Problems:   UTI (urinary tract infection)   Nausea and vomiting   HTN (hypertension)    Hypothyroidism   AKI (acute kidney injury) ([place])    Acute renal failure superimposed on chronic kidney disease -Patient had chronic kidney disease stage III. No recent baseline of creatinine. -Presented with acute renal failure. Etiology is unclear so far. Nephrology is following.  -Patient underwent renal biopsy 1/25 and showed interstitial nephritis. -Per nephrology this could be secondary to pantoprazole, this is discontinued, patient started on prednisone. Continue to monitor urine output and creatinine, check BMP in a.m.  Hypothyroidism. Continue Synthroid 100 mcg daily  Recurrent nausea and vomiting Patient has significantly improved. No vomiting in the last 3 days. CT abdomen in other facility 09/19/2016 shows no acute abnormalities other than hiatal hernia. Ultrasound abdomen 09/21/2016 shows no evidence of obstruction, no hydronephrosis, bilateral renal size roughly 9 cm. Ultrasound repeated. No obvious abnormalities noted. She is status post cholecystectomy. Renal failure and dialysis could be contributing. Dose of Reglan was increased 1/26. Due to her history of GERD and persistent nausea and vomiting Protonix was reinitiated on 1/28, but in view of the pathology findings with Protonix being the likely culprit for her renal failure, this will be stopped.  started on  Orthostatic hypotension Perhaps due to excessive volume being pulled during dialysis. TED stockings for now.  History of stroke Stable. Plavix was placed on hold for renal biopsy. Plavix reinitiated after discussions with Dr. Kathlene Cote 1/26.   Normocytic anemia Hemoglobin has been stable. Likely due to anemia of chronic disease. Hemoglobin is stable.  GERD. Stable. Stop PPI as discussed above.  DVT Prophylaxis: SubCutaneous heparin Code Status: Full code  Family Communication: Discussed with patient  Disposition Plan: Management as outlined above. Mobilize. PT evaluation.    LOS: 17 days   [place] [facility] Pager [phone] 10/09/2016, 11:49 AM  If 7PM-7AM, please contact night-coverage at [email or web address], password [facility]

## 2016-10-10 LAB — RENAL FUNCTION PANEL
ALBUMIN: 3.1 g/dL — AB (ref 3.5–5.0)
Anion gap: 12 (ref 5–15)
BUN: 38 mg/dL — AB (ref 6–20)
CHLORIDE: 104 mmol/L (ref 101–111)
CO2: 22 mmol/L (ref 22–32)
CREATININE: 5.28 mg/dL — AB (ref 0.44–1.00)
Calcium: 8.9 mg/dL (ref 8.9–10.3)
GFR calc Af Amer: 9 mL/min — ABNORMAL LOW (ref >60)
GFR, EST NON AFRICAN AMERICAN: 8 mL/min — AB (ref >60)
GLUCOSE: 97 mg/dL (ref 65–99)
PHOSPHORUS: 4.8 mg/dL — AB (ref 2.5–4.6)
Potassium: 2.9 mmol/L — ABNORMAL LOW (ref 3.5–5.1)
Sodium: 138 mmol/L (ref 135–145)

## 2016-10-10 MED ORDER — POTASSIUM CHLORIDE CRYS ER 20 MEQ PO TBCR
40.0000 meq | EXTENDED_RELEASE_TABLET | Freq: Once | ORAL | Status: AC
  Administered 2016-10-10: 40 meq via ORAL
  Filled 2016-10-10: qty 2

## 2016-10-10 NOTE — Progress Notes (Signed)
Asked Dr. Hartford Poli if foley can be remove, provider said no.

## 2016-10-10 NOTE — Progress Notes (Signed)
Subjective: Interval History: Feels well this AM. No complaints.   Objective: Vital signs in last 24 hours: Temp:  [98 F (36.7 C)-98.3 F (36.8 C)] 98.3 F (36.8 C) (02/02 0453) Pulse Rate:  [77-85] 77 (02/02 0453) Resp:  [19-20] 20 (02/02 0453) BP: (109-146)/(58-71) 109/58 (02/02 0453) SpO2:  [94 %-96 %] 95 % (02/02 0453) Weight:  [221 lb 12.8 oz (100.6 kg)] 221 lb 12.8 oz (100.6 kg) (02/01 2148) Weight change: -3 lb 1.1 oz (-1.392 kg)  Intake/Output from previous day: 02/01 0701 - 02/02 0700 In: 600 [P.O.:600] Out: 1075 [Urine:1075] Intake/Output this shift: No intake/output data recorded.  General appearance: alert, cooperative, no distress and moderately obese Neck: RIJ cath Resp: clear to auscultation bilaterally Cardio: S1, S2 normal and systolic murmur: holosystolic 2/6, blowing at apex GI: obese, pos bs, soft,  Extremities: extremities normal, atraumatic, trace LE edema   Lab Results:  Recent Labs  10/07/16 1418  WBC 5.5  HGB 9.7*  HCT 30.1*  PLT 115*   BMET:   Recent Labs  10/08/16 0607 10/09/16 0536  NA 135 139  K 4.2 3.5  CL 105 104  CO2 20* 25  GLUCOSE 216* 149*  BUN 37* 23*  CREATININE 6.64* 3.88*  CALCIUM 8.8* 8.7*    Recent Labs  10/07/16 1418  PTH 111*  Comment   Iron Studies:   Recent Labs  10/07/16 1418  IRON 44  TIBC 265    Studies/Results: No results found.  I have reviewed the patient's current medications.  Assessment/Plan: 1 Acute on CKD 2/2 to AIN most likely offending agent PPI. Nonoliguric.  On Prednisone day four. Pre-HD Cr 5.2 (improved form prior 6.6). Will hold off on repeat HD today. If Cr tomorrow <5 will remove HD catheter. Anticipate patient to recover.  2. Hypokalemia: Will give Kdur 40 mEq. Will follow.  3 Anemia. Giving ESA.  4 Chronic pancreatitis 5 Obesity 6. GERD. Using H2 blocker given PPI induced AIN.     LOS: 18 days   [name] 10/10/2016,8:40 AM  I have seen and examined this  patient and agree with the plan of care seen, eval, examined, patient counseled, discussed with resident.  Pre HD Cr seems to be trending down. .  Akesha Uresti L 10/10/2016, 4:00 PM

## 2016-10-10 NOTE — Progress Notes (Signed)
[facility] PROGRESS NOTE  Rachael [name] ION:[id number] DOB: [birth year] DOA: 09/22/2016  PCP: No primary care provider on file.   Subjective/Interval History: No complaints this morning.  Brief History/Interval Summary:  61 year old female with a past medical history of chronic kidney disease, hypertension, hypothyroidism, presented with nausea, vomiting. Patient was found to have acute kidney injury. She was transferred to this facility from [facility]. Patient to be started on dialysis. She underwent Renal biopsy 1/25. Plan is for dialysis today.  Reason for Visit: Acute kidney injury  Consultants: Nephrology  Procedures:  Hemodialysis  Renal biopsy 1/25  Antibiotics: None   ROS: Denies any chest pain or shortness of breath.  Objective:  Vital Signs  Vitals:   10/09/16 0910 10/09/16 2148 10/10/16 0453 10/10/16 1004  BP: 110/71 (!) 146/69 (!) 109/58 (!) 107/50  Pulse: 85 79 77 79  Resp: 20 19 20 19   Temp: 98 F (36.7 C) 98.2 F (36.8 C) 98.3 F (36.8 C) 97.7 F (36.5 C)  TempSrc: Oral Oral Oral Oral  SpO2: 96% 94% 95% 95%  Weight:  100.6 kg (221 lb 12.8 oz)    Height:        Intake/Output Summary (Last 24 hours) at 10/10/16 1318 Last data filed at 10/10/16 1049  Gross per 24 hour  Intake              840 ml  Output              925 ml  Net              -85 ml   Filed Weights   10/08/16 1145 10/08/16 2128 10/09/16 2148  Weight: 102 kg (224 lb 13.9 oz) 102.4 kg (225 lb 12 oz) 100.6 kg (221 lb 12.8 oz)    General appearance: alert, cooperative, appears stated age and no distress Resp: clear to auscultation bilaterally Cardio: regular rate and rhythm, S1, S2 normal, no murmur, click, rub or gallop GI: Abdomen is soft. Nontender. Nondistended. No masses or organomegaly. Bowel sounds are present.  Neurologic: No focal deficits  Lab Results:  Data Reviewed: I have personally reviewed following labs and imaging studies  CBC:  Recent  Labs Lab 10/04/16 1456 10/05/16 0541 10/07/16 1418  WBC 7.4 6.9 5.5  HGB 9.9* 9.5* 9.7*  HCT 31.1* 30.4* 30.1*  MCV 79.9 80.0 79.8  PLT 124* 126* 115*    Basic Metabolic Panel:  Recent Labs Lab 10/06/16 0527 10/07/16 0608 10/07/16 1418 10/08/16 0607 10/09/16 0536 10/10/16 0703  NA 139 137  --  135 139 138  K 4.0 3.5  --  4.2 3.5 2.9*  CL 108 106  --  105 104 104  CO2 23 20*  --  20* 25 22  GLUCOSE 94 104*  --  216* 149* 97  BUN 29* 36*  --  37* 23* 38*  CREATININE 5.61* 6.82*  --  6.64* 3.88* 5.28*  CALCIUM 8.7* 8.6* 8.7 8.8* 8.7* 8.9  PHOS 4.2 4.4  --  4.1 4.0 4.8*    GFR: Estimated Creatinine Clearance: 12.3 mL/min (by C-G formula based on SCr of 5.28 mg/dL (H)).  Liver Function Tests:  Recent Labs Lab 10/06/16 0527 10/07/16 2440 10/08/16 0607 10/09/16 0536 10/10/16 0703  ALBUMIN 2.9* 3.0* 2.9* 3.1* 3.1*      Radiology Studies: No results found.   Medications:  Scheduled: . citalopram  20 mg Oral Daily  . clopidogrel  75 mg Oral Daily  . famotidine  20  mg Oral Daily  . heparin  5,000 Units Subcutaneous Q8H  . levothyroxine  100 mcg Oral QAC breakfast  . metoCLOPramide  10 mg Oral TID AC  . multivitamin  1 tablet Oral QHS  . polyethylene glycol  17 g Oral Daily  . predniSONE  60 mg Oral Q breakfast  . senna-docusate  1 tablet Oral BID  . tamsulosin  0.4 mg Oral Daily   Continuous:  SKA:JGOTLX chloride, sodium chloride, ALPRAZolam, heparin, lidocaine (PF), lidocaine-prilocaine, ondansetron (ZOFRAN) IV, pentafluoroprop-tetrafluoroeth, promethazine **OR** promethazine **OR** promethazine  Assessment/Plan:  Principal Problem:   Acute renal failure superimposed on chronic kidney disease (HCC) Active Problems:   UTI (urinary tract infection)   Nausea and vomiting   HTN (hypertension)   Hypothyroidism   AKI (acute kidney injury) ([place])    Acute renal failure superimposed on chronic kidney disease -Patient had chronic kidney disease stage  III. No recent baseline of creatinine.Presented with creatinine of 6.1. -Presented with acute renal failure. Etiology is unclear so far. Nephrology is following.  -Patient underwent renal biopsy 1/25 and showed interstitial nephritis. -Per nephrology this could be secondary to pantoprazole, this is discontinued, patient started on prednisone. -Creatinine today is 5.2, made 1 L of urine yesterday, continue to follow urine output/creatinine in a.m. Check BMP.  Hypothyroidism. Continue Synthroid 100 mcg daily  Recurrent nausea and vomiting Patient has significantly improved. No vomiting in the last 3 days. CT abdomen in other facility 09/19/2016 shows no acute abnormalities other than hiatal hernia. Ultrasound abdomen 09/21/2016 shows no evidence of obstruction, no hydronephrosis, bilateral renal size roughly 9 cm. Ultrasound repeated. No obvious abnormalities noted. She is status post cholecystectomy. Renal failure and dialysis could be contributing. Dose of Reglan was increased 1/26. Due to her history of GERD and persistent nausea and vomiting Protonix was reinitiated on 1/28, but in view of the pathology findings with Protonix being the likely culprit for her renal failure, this will be stopped.  started on  Orthostatic hypotension Perhaps due to excessive volume being pulled during dialysis. TED stockings for now.  History of stroke Stable. Plavix was placed on hold for renal biopsy. Plavix reinitiated after discussions with Dr. Kathlene Cote 1/26.   Normocytic anemia Hemoglobin has been stable. Likely due to anemia of chronic disease. Hemoglobin is stable.  GERD. Stable. Stop PPI as discussed above.  DVT Prophylaxis: SubCutaneous heparin Code Status: Full code  Family Communication: Discussed with patient  Disposition Plan: Management as outlined above. Mobilize. PT evaluation.    LOS: 18 days   [place] [facility] Pager [phone] 10/10/2016, 1:18 PM  If 7PM-7AM,  please contact night-coverage at [email or web address], password [facility]

## 2016-10-11 LAB — RENAL FUNCTION PANEL
Albumin: 2.9 g/dL — ABNORMAL LOW (ref 3.5–5.0)
Anion gap: 12 (ref 5–15)
BUN: 43 mg/dL — AB (ref 6–20)
CHLORIDE: 105 mmol/L (ref 101–111)
CO2: 22 mmol/L (ref 22–32)
CREATININE: 5.68 mg/dL — AB (ref 0.44–1.00)
Calcium: 8.8 mg/dL — ABNORMAL LOW (ref 8.9–10.3)
GFR calc Af Amer: 8 mL/min — ABNORMAL LOW (ref >60)
GFR calc non Af Amer: 7 mL/min — ABNORMAL LOW (ref >60)
GLUCOSE: 128 mg/dL — AB (ref 65–99)
POTASSIUM: 3.6 mmol/L (ref 3.5–5.1)
Phosphorus: 3.9 mg/dL (ref 2.5–4.6)
Sodium: 139 mmol/L (ref 135–145)

## 2016-10-11 NOTE — Progress Notes (Signed)
Subjective: Interval History: Feels well this AM. No complaints.   Objective: Vital signs in last 24 hours: Temp:  [97.7 F (36.5 C)-98.3 F (36.8 C)] 98.3 F (36.8 C) (02/03 0606) Pulse Rate:  [76-80] 76 (02/03 0606) Resp:  [18-20] 20 (02/03 0606) BP: (107-135)/(10-63) 135/63 (02/03 0606) SpO2:  [95 %-99 %] 96 % (02/03 0606) Weight:  [100.7 kg (221 lb 14.4 oz)] 100.7 kg (221 lb 14.4 oz) (02/02 2055) Weight change: 0.045 kg (1.6 oz)  Intake/Output from previous day: 02/02 0701 - 02/03 0700 In: 960 [P.O.:960] Out: 1000 [Urine:1000] Intake/Output this shift: No intake/output data recorded.  General appearance: alert, cooperative, no distress and moderately obese Neck: RIJ cath Resp: clear to auscultation bilaterally Cardio: S1, S2 normal and systolic murmur: holosystolic 2/6 GI: obese, pos bs, soft,  Extremities: extremities normal, atraumatic, no LE edema  Lab Results: No results for input(s): WBC, HGB, HCT, PLT in the last 72 hours. BMET:   Recent Labs  10/10/16 0703 10/11/16 0422  NA 138 139  K 2.9* 3.6  CL 104 105  CO2 22 22  GLUCOSE 97 128*  BUN 38* 43*  CREATININE 5.28* 5.68*  CALCIUM 8.9 8.8*   No results for input(s): PTH in the last 72 hours. Iron Studies:  No results for input(s): IRON, TIBC, TRANSFERRIN, FERRITIN in the last 72 hours.  Studies/Results: No results found.  I have reviewed the patient's current medications.  Assessment/Plan: Rachael [name] is a 61 year old female with a PMH  chronic kidney disease III, hypertension, hypothyroidism, presented with nausea, vomiting found to have AKI due to AIN likely due to chronic PPI  Use.   1 Acute on CKD 2/2 to AIN most likely offending agent PPI. Nonoliguric. On Prednisone day five. Small increase in creatine from yesterday. Will continue to monitor. No need for HD currently Not uremic, or vol xs 2. Hypokalemia, resolved 3 Anemia. Giving ESA 4 Chronic pancreatitis 5 Obesity 6. GERD. Using H2 blocker  given PPI induced AIN.     LOS: 19 days   Smiley Houseman 10/11/2016,7:21 AM  I have seen and examined this patient and agree with the plan of care seen, eval, examined,patient counseled., Discussed with resident .  [place] L 10/11/2016, 12:07 PM

## 2016-10-11 NOTE — Progress Notes (Signed)
[facility] PROGRESS NOTE  Rachael [name] ZGY:[id number] DOB: [birth year] DOA: 09/22/2016  PCP: No primary care provider on file.   Subjective/Interval History: She denies any complaints this morning.  Brief History/Interval Summary:  61 year old female with a past medical history of chronic kidney disease, hypertension, hypothyroidism, presented with nausea, vomiting. Patient was found to have acute kidney injury. She was transferred to this facility from [facility] Patient to be started on dialysis. She underwent Renal biopsy 1/25. Plan is for dialysis today.  Reason for Visit: Acute kidney injury  Consultants: Nephrology  Procedures:  Hemodialysis  Renal biopsy 1/25  Antibiotics: None   ROS: Denies any chest pain or shortness of breath.  Objective:  Vital Signs  Vitals:   10/10/16 1717 10/10/16 2055 10/11/16 0606 10/11/16 0900  BP: 112/61 (!) 123/10 135/63 136/69  Pulse: 76 80 76 77  Resp: 18 19 20 20   Temp: 98.2 F (36.8 C) 97.8 F (36.6 C) 98.3 F (36.8 C) 98 F (36.7 C)  TempSrc: Oral Oral Oral Oral  SpO2: 96% 99% 96% 96%  Weight:  100.7 kg (221 lb 14.4 oz)    Height:        Intake/Output Summary (Last 24 hours) at 10/11/16 1304 Last data filed at 10/11/16 1000  Gross per 24 hour  Intake              960 ml  Output             1350 ml  Net             -390 ml   Filed Weights   10/08/16 2128 10/09/16 2148 10/10/16 2055  Weight: 102.4 kg (225 lb 12 oz) 100.6 kg (221 lb 12.8 oz) 100.7 kg (221 lb 14.4 oz)    General appearance: alert, cooperative, appears stated age and no distress Resp: clear to auscultation bilaterally Cardio: regular rate and rhythm, S1, S2 normal, no murmur, click, rub or gallop GI: Abdomen is soft. Nontender. Nondistended. No masses or organomegaly. Bowel sounds are present.  Neurologic: No focal deficits  Lab Results:  Data Reviewed: I have personally reviewed following labs and imaging studies  CBC:  Recent  Labs Lab 10/04/16 1456 10/05/16 0541 10/07/16 1418  WBC 7.4 6.9 5.5  HGB 9.9* 9.5* 9.7*  HCT 31.1* 30.4* 30.1*  MCV 79.9 80.0 79.8  PLT 124* 126* 115*    Basic Metabolic Panel:  Recent Labs Lab 10/07/16 0608 10/07/16 1418 10/08/16 0607 10/09/16 0536 10/10/16 0703 10/11/16 0422  NA 137  --  135 139 138 139  K 3.5  --  4.2 3.5 2.9* 3.6  CL 106  --  105 104 104 105  CO2 20*  --  20* 25 22 22   GLUCOSE 104*  --  216* 149* 97 128*  BUN 36*  --  37* 23* 38* 43*  CREATININE 6.82*  --  6.64* 3.88* 5.28* 5.68*  CALCIUM 8.6* 8.7 8.8* 8.7* 8.9 8.8*  PHOS 4.4  --  4.1 4.0 4.8* 3.9    GFR: Estimated Creatinine Clearance: 11.4 mL/min (by C-G formula based on SCr of 5.68 mg/dL (H)).  Liver Function Tests:  Recent Labs Lab 10/07/16 5916 10/08/16 0607 10/09/16 0536 10/10/16 0703 10/11/16 0422  ALBUMIN 3.0* 2.9* 3.1* 3.1* 2.9*      Radiology Studies: No results found.   Medications:  Scheduled: . citalopram  20 mg Oral Daily  . clopidogrel  75 mg Oral Daily  . famotidine  20 mg Oral  Daily  . heparin  5,000 Units Subcutaneous Q8H  . levothyroxine  100 mcg Oral QAC breakfast  . metoCLOPramide  10 mg Oral TID AC  . multivitamin  1 tablet Oral QHS  . polyethylene glycol  17 g Oral Daily  . predniSONE  60 mg Oral Q breakfast  . senna-docusate  1 tablet Oral BID  . tamsulosin  0.4 mg Oral Daily   Continuous:  FHQ:RFXJOI chloride, sodium chloride, ALPRAZolam, heparin, lidocaine (PF), lidocaine-prilocaine, ondansetron (ZOFRAN) IV, pentafluoroprop-tetrafluoroeth, promethazine **OR** promethazine **OR** promethazine  Assessment/Plan:  Principal Problem:   Acute renal failure superimposed on chronic kidney disease (HCC) Active Problems:   UTI (urinary tract infection)   Nausea and vomiting   HTN (hypertension)   Hypothyroidism   AKI (acute kidney injury) ([place])    Acute renal failure superimposed on chronic kidney disease -Patient had chronic kidney disease  stage III. No recent baseline of creatinine.Presented with creatinine of 6.1. -Presented with acute renal failure. Etiology is unclear so far. Nephrology is following.  -Patient underwent renal biopsy 1/25 and showed interstitial nephritis. -Per nephrology this could be secondary to pantoprazole, this is discontinued, patient started on prednisone. -Creatinine today is 5.6, urine output is 1 L, continue current management with prednisone. -I appreciate nephrology's help.  Hypothyroidism. Continue Synthroid 100 mcg daily  Recurrent nausea and vomiting Patient has significantly improved. No vomiting in the last 3 days. CT abdomen in other facility 09/19/2016 shows no acute abnormalities other than hiatal hernia. Ultrasound abdomen 09/21/2016 shows no evidence of obstruction, no hydronephrosis, bilateral renal size roughly 9 cm. Ultrasound repeated. No obvious abnormalities noted. She is status post cholecystectomy. Renal failure and dialysis could be contributing. Dose of Reglan was increased 1/26. Due to her history of GERD and persistent nausea and vomiting Protonix was reinitiated on 1/28, but in view of the pathology findings with Protonix being the likely culprit for her renal failure, this will be stopped.  started on  Orthostatic hypotension Perhaps due to excessive volume being pulled during dialysis. TED stockings for now.  History of stroke Stable. Plavix was placed on hold for renal biopsy. Plavix reinitiated after discussions with Dr. Kathlene Cote 1/26.   Normocytic anemia Hemoglobin has been stable. Likely due to anemia of chronic disease. Hemoglobin is stable.  GERD. Stable. Stop PPI as discussed above.  DVT Prophylaxis: SubCutaneous heparin Code Status: Full code  Family Communication: Discussed with patient  Disposition Plan: Management as outlined above. Mobilize. PT evaluation.    LOS: 19 days   [place] [facility] Pager [phone] 10/11/2016, 1:04  PM  If 7PM-7AM, please contact night-coverage at [email or web address], password [facility]

## 2016-10-12 LAB — RENAL FUNCTION PANEL
ALBUMIN: 3 g/dL — AB (ref 3.5–5.0)
ANION GAP: 8 (ref 5–15)
BUN: 49 mg/dL — AB (ref 6–20)
CHLORIDE: 110 mmol/L (ref 101–111)
CO2: 22 mmol/L (ref 22–32)
Calcium: 9.1 mg/dL (ref 8.9–10.3)
Creatinine, Ser: 5.68 mg/dL — ABNORMAL HIGH (ref 0.44–1.00)
GFR calc Af Amer: 8 mL/min — ABNORMAL LOW (ref >60)
GFR, EST NON AFRICAN AMERICAN: 7 mL/min — AB (ref >60)
GLUCOSE: 94 mg/dL (ref 65–99)
PHOSPHORUS: 5.2 mg/dL — AB (ref 2.5–4.6)
POTASSIUM: 3.7 mmol/L (ref 3.5–5.1)
Sodium: 140 mmol/L (ref 135–145)

## 2016-10-12 NOTE — Progress Notes (Signed)
[facility] PROGRESS NOTE  Rachael [name] GQQ:[id number] DOB: [birth year] DOA: 09/22/2016  PCP: No primary care provider on file.   Subjective/Interval History: No changes clinically, denies any complaints this morning.  Brief History/Interval Summary:  61 year old female with a past medical history of chronic kidney disease, hypertension, hypothyroidism, presented with nausea, vomiting. Patient was found to have acute kidney injury. She was transferred to this facility from [facility]. Patient to be started on dialysis. She underwent Renal biopsy 1/25. Plan is for dialysis today.  Reason for Visit: Acute kidney injury  Consultants: Nephrology  Procedures:  Hemodialysis  Renal biopsy 1/25  Antibiotics: None   ROS: Denies any chest pain or shortness of breath.  Objective:  Vital Signs  Vitals:   10/11/16 1900 10/11/16 2155 10/12/16 0611 10/12/16 0900  BP: 130/66 134/64 119/72 103/74  Pulse: 80 74 71 77  Resp: 18 18 18 18   Temp: 98.2 F (36.8 C) 98 F (36.7 C) 98.3 F (36.8 C) 99 F (37.2 C)  TempSrc: Oral Oral Oral Oral  SpO2: 98% 99% 95% 98%  Weight:  99.7 kg (219 lb 11.2 oz)    Height:        Intake/Output Summary (Last 24 hours) at 10/12/16 1233 Last data filed at 10/12/16 1000  Gross per 24 hour  Intake              720 ml  Output             2175 ml  Net            -1455 ml   Filed Weights   10/09/16 2148 10/10/16 2055 10/11/16 2155  Weight: 100.6 kg (221 lb 12.8 oz) 100.7 kg (221 lb 14.4 oz) 99.7 kg (219 lb 11.2 oz)    General appearance: alert, cooperative, appears stated age and no distress Resp: clear to auscultation bilaterally Cardio: regular rate and rhythm, S1, S2 normal, no murmur, click, rub or gallop GI: Abdomen is soft. Nontender. Nondistended. No masses or organomegaly. Bowel sounds are present.  Neurologic: No focal deficits  Lab Results:  Data Reviewed: I have personally reviewed following labs and imaging  studies  CBC:  Recent Labs Lab 10/07/16 1418  WBC 5.5  HGB 9.7*  HCT 30.1*  MCV 79.8  PLT 115*    Basic Metabolic Panel:  Recent Labs Lab 10/08/16 0607 10/09/16 0536 10/10/16 0703 10/11/16 0422 10/12/16 0450  NA 135 139 138 139 140  K 4.2 3.5 2.9* 3.6 3.7  CL 105 104 104 105 110  CO2 20* 25 22 22 22   GLUCOSE 216* 149* 97 128* 94  BUN 37* 23* 38* 43* 49*  CREATININE 6.64* 3.88* 5.28* 5.68* 5.68*  CALCIUM 8.8* 8.7* 8.9 8.8* 9.1  PHOS 4.1 4.0 4.8* 3.9 5.2*    GFR: Estimated Creatinine Clearance: 11.3 mL/min (by C-G formula based on SCr of 5.68 mg/dL (H)).  Liver Function Tests:  Recent Labs Lab 10/08/16 0607 10/09/16 0536 10/10/16 0703 10/11/16 0422 10/12/16 0450  ALBUMIN 2.9* 3.1* 3.1* 2.9* 3.0*      Radiology Studies: No results found.   Medications:  Scheduled: . citalopram  20 mg Oral Daily  . clopidogrel  75 mg Oral Daily  . famotidine  20 mg Oral Daily  . heparin  5,000 Units Subcutaneous Q8H  . levothyroxine  100 mcg Oral QAC breakfast  . metoCLOPramide  10 mg Oral TID AC  . multivitamin  1 tablet Oral QHS  . polyethylene glycol  17  g Oral Daily  . predniSONE  60 mg Oral Q breakfast  . senna-docusate  1 tablet Oral BID  . tamsulosin  0.4 mg Oral Daily   Continuous:  ERD:EYCXKG chloride, sodium chloride, ALPRAZolam, heparin, lidocaine (PF), lidocaine-prilocaine, ondansetron (ZOFRAN) IV, pentafluoroprop-tetrafluoroeth, promethazine **OR** promethazine **OR** promethazine  Assessment/Plan:  Principal Problem:   Acute renal failure superimposed on chronic kidney disease (HCC) Active Problems:   UTI (urinary tract infection)   Nausea and vomiting   HTN (hypertension)   Hypothyroidism   AKI (acute kidney injury) ([place])    Acute renal failure superimposed on chronic kidney disease -Patient had chronic kidney disease stage III. No recent baseline of creatinine.Presented with creatinine of 6.1. -Presented with acute renal failure.  Etiology is unclear so far. Nephrology is following.  -Patient underwent renal biopsy 1/25 and showed interstitial nephritis. -Per nephrology this could be secondary to pantoprazole, this is discontinued, patient started on prednisone. -Creatinine plateaued at 5.6, 2.4 L of urine output, continue to follow output/creatinine -Per nephrology if creatinine stable and continues to have good urine output remove the access and discharge in 1-2 days.  Hypothyroidism. Continue Synthroid 100 mcg daily  Recurrent nausea and vomiting Patient has significantly improved. No vomiting in the last 3 days. CT abdomen in other facility 09/19/2016 shows no acute abnormalities other than hiatal hernia. Ultrasound abdomen 09/21/2016 shows no evidence of obstruction, no hydronephrosis, bilateral renal size roughly 9 cm. Ultrasound repeated. No obvious abnormalities noted. She is status post cholecystectomy. Renal failure and dialysis could be contributing. Dose of Reglan was increased 1/26. Due to her history of GERD and persistent nausea and vomiting Protonix was reinitiated on 1/28, but in view of the pathology findings with Protonix being the likely culprit for her renal failure, this will be stopped.  started on  Orthostatic hypotension Perhaps due to excessive volume being pulled during dialysis. TED stockings for now.  History of stroke Stable. Plavix was placed on hold for renal biopsy. Plavix reinitiated after discussions with Dr. Kathlene Cote 1/26.   Normocytic anemia Hemoglobin has been stable. Likely due to anemia of chronic disease. Hemoglobin is stable.  GERD. Stable. Stop PPI as discussed above.  DVT Prophylaxis: SubCutaneous heparin Code Status: Full code  Family Communication: Discussed with patient  Disposition Plan: Management as outlined above. Mobilize. PT evaluation.    LOS: 20 days   [place] [facility] Pager [phone] 10/12/2016, 12:33 PM  If 7PM-7AM, please contact  night-coverage at [email or web address], password [facility]

## 2016-10-12 NOTE — Progress Notes (Signed)
Subjective: Interval History: Feeling well this morning. Tolerating PO well. No complaints.   Objective: Vital signs in last 24 hours: Temp:  [98 F (36.7 C)-98.3 F (36.8 C)] 98.3 F (36.8 C) (02/04 0611) Pulse Rate:  [71-80] 71 (02/04 0611) Resp:  [18-20] 18 (02/04 0611) BP: (119-136)/(64-72) 119/72 (02/04 0611) SpO2:  [95 %-99 %] 95 % (02/04 0611) Weight:  [219 lb 11.2 oz (99.7 kg)] 219 lb 11.2 oz (99.7 kg) (02/03 2155) Weight change: -2 lb 3.2 oz (-0.998 kg)  Intake/Output from previous day: 02/03 0701 - 02/04 0700 In: 720 [P.O.:720] Out: 2475 [Urine:2475] Intake/Output this shift: No intake/output data recorded.  General appearance: alert, cooperative, no distress and moderately obese Neck: RIJ cath Resp: clear to auscultation bilaterally Cardio: S1, S2 normal and systolic murmur: holosystolic 2/6 GI: obese, pos bs, soft  Extremities: extremities normal, atraumatic, no LE edema  Lab Results: No results for input(s): WBC, HGB, HCT, PLT in the last 72 hours. BMET:   Recent Labs  10/11/16 0422 10/12/16 0450  NA 139 140  K 3.6 3.7  CL 105 110  CO2 22 22  GLUCOSE 128* 94  BUN 43* 49*  CREATININE 5.68* 5.68*  CALCIUM 8.8* 9.1   No results for input(s): PTH in the last 72 hours. Iron Studies:  No results for input(s): IRON, TIBC, TRANSFERRIN, FERRITIN in the last 72 hours.  Studies/Results: No results found.  I have reviewed the patient's current medications.  Assessment/Plan: [name] is a 61 year old female with a PMH  chronic kidney disease III, hypertension, hypothyroidism, presented with nausea, vomiting found to have AKI due to AIN likely due to chronic PPI  Use.   1 Acute on CKD 2/2 to AIN most likely offending agent PPI. Nonoliguric. On Prednisone day 6. Will plan to decrease to 40 mg after one week therapy from 60 mg. Cr/BUN plateau.If remains stable on repeat checks go remove access. Will continue to monitor. No need for HD currently as patient not  uremic, acedemic or with signs of volume overload.  If stable, will pull HD cath and f/u outpatient 2. Hypokalemia, resolved 3 Anemia. Giving ESA 4 Chronic pancreatitis 5 Obesity 6. GERD. Using H2 blocker given PPI induced AIN.     LOS: 20 days   [name] 10/12/2016,8:02 AM I have seen and examined this patient and agree with the plan of care seen, eval,examined, patient counseled, discussed with resident. .  [name] 10/12/2016, 1:47 PM

## 2016-10-13 LAB — RENAL FUNCTION PANEL
ALBUMIN: 3 g/dL — AB (ref 3.5–5.0)
Anion gap: 12 (ref 5–15)
BUN: 54 mg/dL — AB (ref 6–20)
CO2: 19 mmol/L — ABNORMAL LOW (ref 22–32)
CREATININE: 5.89 mg/dL — AB (ref 0.44–1.00)
Calcium: 9.1 mg/dL (ref 8.9–10.3)
Chloride: 107 mmol/L (ref 101–111)
GFR, EST AFRICAN AMERICAN: 8 mL/min — AB (ref >60)
GFR, EST NON AFRICAN AMERICAN: 7 mL/min — AB (ref >60)
Glucose, Bld: 101 mg/dL — ABNORMAL HIGH (ref 65–99)
PHOSPHORUS: 5.1 mg/dL — AB (ref 2.5–4.6)
POTASSIUM: 3.5 mmol/L (ref 3.5–5.1)
Sodium: 138 mmol/L (ref 135–145)

## 2016-10-13 NOTE — Care Management Important Message (Signed)
Important Message  Patient Details  Name: Rachael [name] MRN: [id number] Date of Birth: 09-May-1956   Medicare Important Message Given:  Yes    [name] Montine Circle 10/13/2016, 4:53 PM

## 2016-10-13 NOTE — Progress Notes (Signed)
[facility] PROGRESS NOTE  Rachael [name] RKY:[id number] DOB: 07/29/1956 DOA: 09/22/2016  PCP: No primary care provider on file.   Subjective/Interval History: No complaints earlier today, no changes clinically, plans to taper prednisone in a.m. noted.  Brief History/Interval Summary:  61 year old female with a past medical history of chronic kidney disease, hypertension, hypothyroidism, presented with nausea, vomiting. Patient was found to have acute kidney injury. She was transferred to this facility from [facility]. Patient to be started on dialysis. She underwent Renal biopsy 1/25. Plan is for dialysis today.  Reason for Visit: Acute kidney injury  Consultants: Nephrology  Procedures:  Hemodialysis  Renal biopsy 1/25  Antibiotics: None   ROS: Denies any chest pain or shortness of breath.  Objective:  Vital Signs  Vitals:   10/12/16 1900 10/12/16 2038 10/13/16 0532 10/13/16 1042  BP: 110/74 127/65 109/65 117/66  Pulse: 80 77 72 78  Resp: 18 18 18 18   Temp: 98.9 F (37.2 C) 98.7 F (37.1 C) 98.2 F (36.8 C) 98 F (36.7 C)  TempSrc: Oral Oral Oral Oral  SpO2: 98% 95% 96% 96%  Weight:  100.5 kg (221 lb 9 oz)    Height:        Intake/Output Summary (Last 24 hours) at 10/13/16 1319 Last data filed at 10/13/16 1000  Gross per 24 hour  Intake              720 ml  Output             1575 ml  Net             -855 ml   Filed Weights   10/10/16 2055 10/11/16 2155 10/12/16 2038  Weight: 100.7 kg (221 lb 14.4 oz) 99.7 kg (219 lb 11.2 oz) 100.5 kg (221 lb 9 oz)    General appearance: alert, cooperative, appears stated age and no distress Resp: clear to auscultation bilaterally Cardio: regular rate and rhythm, S1, S2 normal, no murmur, click, rub or gallop GI: Abdomen is soft. Nontender. Nondistended. No masses or organomegaly. Bowel sounds are present.  Neurologic: No focal deficits  Lab Results:  Data Reviewed: I have personally reviewed  following labs and imaging studies  CBC:  Recent Labs Lab 10/07/16 1418  WBC 5.5  HGB 9.7*  HCT 30.1*  MCV 79.8  PLT 115*    Basic Metabolic Panel:  Recent Labs Lab 10/09/16 0536 10/10/16 0703 10/11/16 0422 10/12/16 0450 10/13/16 0510  NA 139 138 139 140 138  K 3.5 2.9* 3.6 3.7 3.5  CL 104 104 105 110 107  CO2 25 22 22 22  19*  GLUCOSE 149* 97 128* 94 101*  BUN 23* 38* 43* 49* 54*  CREATININE 3.88* 5.28* 5.68* 5.68* 5.89*  CALCIUM 8.7* 8.9 8.8* 9.1 9.1  PHOS 4.0 4.8* 3.9 5.2* 5.1*    GFR: Estimated Creatinine Clearance: 11 mL/min (by C-G formula based on SCr of 5.89 mg/dL (H)).  Liver Function Tests:  Recent Labs Lab 10/09/16 0536 10/10/16 0703 10/11/16 0422 10/12/16 0450 10/13/16 0510  ALBUMIN 3.1* 3.1* 2.9* 3.0* 3.0*      Radiology Studies: No results found.   Medications:  Scheduled: . citalopram  20 mg Oral Daily  . clopidogrel  75 mg Oral Daily  . famotidine  20 mg Oral Daily  . heparin  5,000 Units Subcutaneous Q8H  . levothyroxine  100 mcg Oral QAC breakfast  . metoCLOPramide  10 mg Oral TID AC  . multivitamin  1 tablet Oral  QHS  . polyethylene glycol  17 g Oral Daily  . predniSONE  60 mg Oral Q breakfast  . senna-docusate  1 tablet Oral BID   Continuous:  WEX:HBZJIR chloride, sodium chloride, ALPRAZolam, heparin, lidocaine (PF), lidocaine-prilocaine, ondansetron (ZOFRAN) IV, pentafluoroprop-tetrafluoroeth, promethazine **OR** promethazine **OR** promethazine  Assessment/Plan:  Principal Problem:   Acute renal failure superimposed on chronic kidney disease (HCC) Active Problems:   UTI (urinary tract infection)   Nausea and vomiting   HTN (hypertension)   Hypothyroidism   AKI (acute kidney injury) ([place])    Acute renal failure superimposed on chronic kidney disease -Patient had chronic kidney disease stage III. No recent baseline of creatinine.Presented with creatinine of 6.1. -Presented with acute renal failure. Etiology is  unclear so far. Nephrology is following.  -Patient underwent renal biopsy 1/25 and showed interstitial nephritis. -Per nephrology this could be secondary to pantoprazole, this is discontinued, patient started on prednisone. -Creatinine plateaued at 5.6, 2.4 L of urine output, continue to follow output/creatinine -Per nephrology if creatinine stable and continues to have good urine output remove the access and discharge in 1-2 days. -No changes in the plan. Follow BMP in a.m.  Hypothyroidism. Continue Synthroid 100 mcg daily  Recurrent nausea and vomiting Patient has significantly improved. No vomiting in the last 3 days. CT abdomen in other facility 09/19/2016 shows no acute abnormalities other than hiatal hernia. Ultrasound abdomen 09/21/2016 shows no evidence of obstruction, no hydronephrosis, bilateral renal size roughly 9 cm. Ultrasound repeated. No obvious abnormalities noted. She is status post cholecystectomy. Renal failure and dialysis could be contributing. Dose of Reglan was increased 1/26. Due to her history of GERD and persistent nausea and vomiting Protonix was reinitiated on 1/28, but in view of the pathology findings with Protonix being the likely culprit for her renal failure, this will be stopped.  started on  Orthostatic hypotension Perhaps due to excessive volume being pulled during dialysis. TED stockings for now.  History of stroke Stable. Plavix was placed on hold for renal biopsy. Plavix reinitiated after discussions with Dr. Kathlene Cote 1/26.   Normocytic anemia Hemoglobin has been stable. Likely due to anemia of chronic disease. Hemoglobin is stable.  GERD. Stable. Stop PPI as discussed above.  DVT Prophylaxis: SubCutaneous heparin Code Status: Full code  Family Communication: Discussed with patient  Disposition Plan: Management as outlined above. Mobilize. PT evaluation.    LOS: 21 days   [place] [facility] Pager [phone] 10/13/2016, 1:19  PM  If 7PM-7AM, please contact night-coverage at [email or web address], password [facility]

## 2016-10-13 NOTE — Progress Notes (Signed)
Subjective: Interval History: No complaints. Feeling well this morning.    Objective: Vital signs in last 24 hours: Temp:  [98.2 F (36.8 C)-99 F (37.2 C)] 98.2 F (36.8 C) (02/05 0532) Pulse Rate:  [72-80] 72 (02/05 0532) Resp:  [18] 18 (02/05 0532) BP: (103-127)/(65-74) 109/65 (02/05 0532) SpO2:  [95 %-98 %] 96 % (02/05 0532) Weight:  [221 lb 9 oz (100.5 kg)] 221 lb 9 oz (100.5 kg) (02/04 2038) Weight change: 1 lb 13.8 oz (0.845 kg)  Intake/Output from previous day: 02/04 0701 - 02/05 0700 In: 720 [P.O.:720] Out: 1525 [Urine:1525] Intake/Output this shift: No intake/output data recorded.  General appearance: alert, cooperative, no distress and moderately obese Neck: RIJ cath Resp: clear to auscultation bilaterally Cardio: S1, S2 normal and systolic murmur: holosystolic 2/6 GI: obese, pos bs, soft  Extremities: extremities normal, atraumatic, no LE edema  Lab Results: No results for input(s): WBC, HGB, HCT, PLT in the last 72 hours. BMET:   Recent Labs  10/12/16 0450 10/13/16 0510  NA 140 138  K 3.7 3.5  CL 110 107  CO2 22 19*  GLUCOSE 94 101*  BUN 49* 54*  CREATININE 5.68* 5.89*  CALCIUM 9.1 9.1   No results for input(s): PTH in the last 72 hours. Iron Studies:  No results for input(s): IRON, TIBC, TRANSFERRIN, FERRITIN in the last 72 hours.  Studies/Results: No results found.  I have reviewed the patient's current medications.  Assessment/Plan: Ms. Pates is a 61 year old female with a PMH  chronic kidney disease III, hypertension, hypothyroidism, presented with nausea, vomiting found to have AKI due to AIN likely due to chronic PPI  Use.   1 Acute on CKD 2/2 to AIN most likely offending agent PPI. Nonoliguric. On Prednisone day 7. Will plan to decrease to 40 mg tomorrow. Cr/BUN plateau. . Will continue to monitor. No need for HD currently.  If stable, will pull HD cath and f/u outpatient. 2 Anemia. Giving ESA 3 Chronic pancreatitis 4 Obesity 5. GERD.  Using H2 blocker given PPI induced AIN.     LOS: 21 days   [name] 10/13/2016,7:40 AM    Renal Attending: Orthostatic today.  Will stop Flomax.  No rush to d/c dialysis catheter.  Slow worsening  Kyoko Elsea C

## 2016-10-13 NOTE — Progress Notes (Signed)
Physical Therapy Treatment Patient Details Name: Rachael [name] MRN: [id number] DOB: July 05, 1956 Today's Date: 10/13/2016    History of Present Illness  61 year old female with a past medical history of chronic kidney disease, hypertension, hypothyroidism, presented with nausea, vomiting. Patient was found to have acute kidney injury. She was transferred to this facility from [facility]. Patient to be started on dialysis. She underwent Renal biopsy 1/25    PT Comments    Gait stability has improved back closer to pt's baseline, pt no longer needing the RW for stability, but still mildly unsteady on initial ambulation when I suspect she is mildly orthostatic.   Follow Up Recommendations  No PT follow up;Supervision - Intermittent     Equipment Recommendations  None recommended by PT    Recommendations for Other Services       Precautions / Restrictions Precautions Precautions: Fall (minimal risk) Restrictions Weight Bearing Restrictions: No    Mobility  Bed Mobility Overal bed mobility: Independent             General bed mobility comments: bed flat  Transfers Overall transfer level: Modified independent   Transfers: Sit to/from Stand Sit to Stand: Modified independent (Device/Increase time)         General transfer comment: cued stand momentarily and assess symptoms of hypotension  Also marched in place at sign of a little dizziness.  Ambulation/Gait Ambulation/Gait assistance: Min guard Ambulation Distance (Feet): 250 Feet Assistive device: None Gait Pattern/deviations: Step-through pattern   Gait velocity interpretation: at or above normal speed for age/gender General Gait Details: Initially mildly unsteady, but not LOB.  Improvement over course of gait with pt able to scan and increase cadence appreciably.   Stairs            Wheelchair Mobility    Modified Rankin (Stroke Patients Only)       Balance Overall balance assessment: Needs  assistance   Sitting balance-Leahy Scale: Good       Standing balance-Leahy Scale: Good                      Cognition Arousal/Alertness: Awake/alert Behavior During Therapy: WFL for tasks assessed/performed Overall Cognitive Status: Within Functional Limits for tasks assessed                      Exercises      General Comments        Pertinent Vitals/Pain Pain Assessment: No/denies pain    Home Living                      Prior Function            PT Goals (current goals can now be found in the care plan section) Acute Rehab PT Goals Patient Stated Goal: Return home PT Goal Formulation: With patient Time For Goal Achievement: 10/19/16 Potential to Achieve Goals: Good Progress towards PT goals: Progressing toward goals    Frequency    Min 3X/week      PT Plan Current plan remains appropriate    Co-evaluation             End of Session   Activity Tolerance: Patient tolerated treatment well Patient left: in bed;with call bell/phone within reach;with bed alarm set     Time: 2376-2831 PT Time Calculation (min) (ACUTE ONLY): 15 min  Charges:  $Gait Training: 8-22 mins  G CodesTessie Fass Adenike Shidler 10/13/2016, 6:03 PM 10/13/2016  Donnella Sham, PT [phone]  (pager)

## 2016-10-13 NOTE — Progress Notes (Signed)
Occupational Therapy Treatment Patient Details Name: Rachael [name] MRN: [id number] DOB: 01/06/1956 Today's Date: 10/13/2016    History of present illness  61 year old female with a past medical history of chronic kidney disease, hypertension, hypothyroidism, presented with nausea, vomiting. Patient was found to have acute kidney injury. She was transferred to this facility from [facility]. Patient to be started on dialysis. She underwent Renal biopsy 1/25   OT comments  Pt orthostatic this session. Supine 127/69. Sit 123/79. Stand 94/71. Stand after 1 min 87/41. Pt began to feel "light headed". Pt sitting in chair and educated to call for assistance with mobility due to orthostasis. Pt verbalized understanding. Will continue to follow acutely.   Follow Up Recommendations  No OT follow up    Equipment Recommendations  None recommended by OT    Recommendations for Other Services      Precautions / Restrictions Precautions Precautions: Fall Precaution Comments: watch BP       Mobility Bed Mobility Overal bed mobility: Independent                Transfers    S                  Balance     Sitting balance-Leahy Scale: Good       Standing balance-Leahy Scale: Good                     ADL                                       Functional mobility during ADLs: Supervision/safety. S with mobility General ADL Comments: Pt limited due to orthostatic. Able to complete ADL tasks with overall set up; howver, limited due ot orthostasis today.      Vision                     Perception     Praxis      Cognition   Behavior During Therapy: WFL for tasks assessed/performed Overall Cognitive Status: Within Functional Limits for tasks assessed                       Extremity/Trunk Assessment               Exercises     Shoulder Instructions       General Comments      Pertinent Vitals/ Pain        Pain Assessment: No/denies pain  Home Living                                          Prior Functioning/Environment              Frequency  Min 2X/week        Progress Toward Goals  OT Goals(current goals can now be found in the care plan section)  Progress towards OT goals: Progressing toward goals  Acute Rehab OT Goals Patient Stated Goal: Return home OT Goal Formulation: With patient Time For Goal Achievement: 10/17/16 Potential to Achieve Goals: Good ADL Goals Pt Will Perform Grooming: Independently;standing Pt Will Transfer to Toilet: Independently;ambulating;regular height toilet Additional ADL Goal #1: Pt will complete adl task at sink level for > 2 minutes without symtpoms  Plan Discharge plan remains appropriate    Co-evaluation                 End of Session     Activity Tolerance Treatment limited secondary to medical complications (Comment) (orthostatic)   Patient Left in chair;with call bell/phone within reach;with chair alarm set   Nurse Communication Mobility status;Other (comment) (BP)        Time: 1150-1210 OT Time Calculation (min): 20 min  Charges: OT General Charges $OT Visit: 1 Procedure OT Treatments $Self Care/Home Management : 8-22 mins  Hsiao,HILLARY 10/13/2016, 12:23 PM   [facility], OTR/L  [phone] 10/13/2016

## 2016-10-14 LAB — RENAL FUNCTION PANEL
ALBUMIN: 3 g/dL — AB (ref 3.5–5.0)
Anion gap: 14 (ref 5–15)
BUN: 56 mg/dL — AB (ref 6–20)
CALCIUM: 9.2 mg/dL (ref 8.9–10.3)
CHLORIDE: 107 mmol/L (ref 101–111)
CO2: 17 mmol/L — ABNORMAL LOW (ref 22–32)
CREATININE: 5.66 mg/dL — AB (ref 0.44–1.00)
GFR calc Af Amer: 8 mL/min — ABNORMAL LOW (ref >60)
GFR, EST NON AFRICAN AMERICAN: 7 mL/min — AB (ref >60)
Glucose, Bld: 88 mg/dL (ref 65–99)
PHOSPHORUS: 5.2 mg/dL — AB (ref 2.5–4.6)
Potassium: 3.6 mmol/L (ref 3.5–5.1)
Sodium: 138 mmol/L (ref 135–145)

## 2016-10-14 MED ORDER — FAMOTIDINE 20 MG PO TABS: 20.0000 mg | ORAL_TABLET | Freq: Two times a day (BID) | ORAL | 0 refills | Status: DC

## 2016-10-14 MED ORDER — PREDNISONE 20 MG PO TABS: 40.0000 mg | ORAL_TABLET | Freq: Every day | ORAL | Status: DC

## 2016-10-14 MED ORDER — PREDNISONE 20 MG PO TABS: 40.0000 mg | ORAL_TABLET | Freq: Every day | ORAL | 0 refills | Status: DC

## 2016-10-14 NOTE — Progress Notes (Signed)
Subjective: Interval History: No complaints. Feeling well this morning.    Objective: Vital signs in last 24 hours: Temp:  [98 F (36.7 C)-98.7 F (37.1 C)] 98 F (36.7 C) (02/06 0912) Pulse Rate:  [66-78] 74 (02/06 0912) Resp:  [16-18] 18 (02/06 0912) BP: (117-124)/(59-67) 124/67 (02/06 0459) SpO2:  [95 %-97 %] 97 % (02/06 0459) Weight:  [218 lb (98.9 kg)] 218 lb (98.9 kg) (02/05 2204) Weight change: -3 lb 9 oz (-1.616 kg)  Intake/Output from previous day: 02/05 0701 - 02/06 0700 In: 720 [P.O.:720] Out: 1975 [Urine:1975] Intake/Output this shift: Total I/O In: 240 [P.O.:240] Out: 300 [Urine:300]  General appearance: alert, cooperative, no distress and moderately obese Neck: RIJ cath Resp: clear to auscultation bilaterally Cardio: S1, S2 normal and systolic murmur: holosystolic 2/6 GI: obese, pos bs, soft  Extremities: extremities normal, atraumatic, no LE edema  Lab Results: No results for input(s): WBC, HGB, HCT, PLT in the last 72 hours. BMET:   Recent Labs  10/13/16 0510 10/14/16 0633  NA 138 138  K 3.5 3.6  CL 107 107  CO2 19* 17*  GLUCOSE 101* 88  BUN 54* 56*  CREATININE 5.89* 5.66*  CALCIUM 9.1 9.2   No results for input(s): PTH in the last 72 hours. Iron Studies:  No results for input(s): IRON, TIBC, TRANSFERRIN, FERRITIN in the last 72 hours.  Studies/Results: No results found.  I have reviewed the patient's current medications.  Assessment/Plan: Rachael [name] is a 61 year old female with a PMH  chronic kidney disease III, hypertension, hypothyroidism, presented with nausea, vomiting found to have AKI due to AIN likely due to chronic PPI  Use.   1 Acute on CKD 2/2 to AIN most likely offending agent PPI. Nonoliguric. On Prednisone day 8. Have decreased dose to 40mg  daily. Cr/BUN plateau. Mild increase in acidosis.No need for HD currently.  Plan to d/c but leave HD catheter in place. Follow up outpatient.  2 Anemia. Giving ESA 3 Chronic  pancreatitis 4 Obesity 5. GERD. Using H2 blocker given PPI induced AIN.     LOS: 22 days   Rachael [name] 10/14/2016,9:34 AM    Renal Attending: Stable renal fct.  Will keep Rachael [name] for now and flush with heparin before DC.[name] She will get labs in a week at our office and f/u with Rachael [name] in 1-2 weeks Rachael [name] C

## 2016-10-14 NOTE — Discharge Summary (Signed)
Physician Discharge Summary  Rachael [name] WPY:[id number] DOB: Oct 21, 1955 DOA: 09/22/2016  PCP: Ronita Hipps, MD  Admit date: 09/22/2016 Discharge date: 10/14/2016  Admitted From: Home Disposition: Home  Recommendations for Outpatient Follow-up:  1. Follow up with PCP in 1-2 weeks 2. Please obtain BMP/CBC in one week  Home Health: NA Equipment/Devices:NA  Discharge Condition: Stable CODE STATUS: Full Code Diet recommendation: Diet renal with fluid restriction Fluid restriction: 1200 mL Fluid; Room service appropriate? Yes; Fluid consistency: Thin  Brief/Interim Summary: 61 year old female with a past medical history of chronic kidney disease, hypertension, hypothyroidism, presented with nausea, vomiting. Patient was found to have acute kidney injury. She was transferred to this facility from [facility]. Patient to be started on dialysis. She underwent Renal biopsy 1/25.   Discharge Diagnoses:  Principal Problem:   Acute renal failure superimposed on chronic kidney disease ([place]) Active Problems:   UTI (urinary tract infection)   Nausea and vomiting   HTN (hypertension)   Hypothyroidism   AKI (acute kidney injury) ([place])    Acute renal failure superimposed on chronic kidney disease -Patient has CKD stage III. No recent baseline of cr, (in 11/2013 was 1.7). Presented with creatinine of 6.1. -Patient underwent renal biopsy 1/25 and showed acute interstitial nephritis. -Per nephrology this could be secondary to pantoprazole, this is discontinued, patient started on prednisone. -Patient underwent 4 sessions of dialysis, she had prolonged hospital stay for 22 days. -Creatinine plateaued at 5.6, she is nonoliguric making good urine from 1.5-2.5 L per day. -Creatinine continue to be stable, 5.6 on day of discharge. -Discussed with Dr. Florene Glen on discharge recommended not to remove the dialysis catheter on discharge. -Patient to follow-up with [facility] Hollie Salk of [facility], discharged on 40 mg of prednisone.  Hypothyroidism. Continue Synthroid 100 mcg daily  Recurrent nausea and vomiting -Significant nausea and vomiting, CT scan done in [facility] on 09/19/2016 shows no acute abnormalities. -Has hiatal hernia and history of GERD that this could be the culprit. -Currently not on PPI, started on Pepcid, treated with as needed Reglan while she was in the hospital. -This is resolved.  Orthostatic hypotension Perhaps due to excessive volume being pulled during dialysis. TED stockings for now.  History of stroke Stable. Plavix was placed on hold for renal biopsy. Plavix reinitiated after the kidney biopsy.  Normocytic anemia Hemoglobin has been stable. Likely due to anemia of chronic disease. Hemoglobin is stable.  GERD. -This is stable, Protonix discontinued as it was suspected to be the offending medication for the acute interstitial nephritis. -Discharged on famotidine   Discharge Instructions   Allergies as of 10/14/2016      Reactions   Penicillins Rash, Other (See Comments)   Tolerates rocephin   Sulfa Antibiotics Rash      Medication List    STOP taking these medications   pantoprazole 40 MG tablet Commonly known as:  PROTONIX     TAKE these medications   atorvastatin 40 MG tablet Commonly known as:  LIPITOR Take 40 mg by mouth daily.   citalopram 40 MG tablet Commonly known as:  CELEXA Take 40 mg by mouth daily.   clopidogrel 75 MG tablet Commonly known as:  PLAVIX Take 75 mg by mouth daily.   famotidine 20 MG tablet Commonly known as:  PEPCID Take 1 tablet (20 mg total) by mouth 2 (two) times daily.   levothyroxine 100 MCG tablet Commonly known as:  SYNTHROID, LEVOTHROID Take 100 mcg by mouth daily before breakfast.  predniSONE 20 MG tablet Commonly known as:  DELTASONE Take 2 tablets (40 mg total) by mouth daily with breakfast. Start taking on:  10/15/2016       Allergies  Allergen  Reactions  . Penicillins Rash and Other (See Comments)    Tolerates rocephin  . Sulfa Antibiotics Rash    Consultations:  Treatment Team:   Madelon Lips, MD   Procedures (Echo, Carotid, EGD, Colonoscopy, ERCP)   Radiological studies: Dg Chest 2 View  Result Date: 09/25/2016 CLINICAL DATA:  Cough.  Hypertension. EXAM: CHEST  2 VIEW COMPARISON:  September 22, 2016 FINDINGS: There is no edema or consolidation. Heart size and pulmonary vascular normal. No adenopathy. There is aortic atherosclerosis. No bone lesions. IMPRESSION: No edema or consolidation. The ill-defined opacity overlying the heart anteriorly on the lateral view is no longer evident. Stable cardiac silhouette. There is aortic atherosclerosis. Electronically Signed   By: Lowella Grip III M.D.   On: 09/25/2016 07:32   Dg Chest 2 View  Result Date: 09/22/2016 CLINICAL DATA:  Nonproductive cough.  Shortness of breath EXAM: CHEST  2 VIEW COMPARISON:  CT 06/07/2016 . FINDINGS: Mediastinum and hilar structures normal. Cardiomegaly with normal pulmonary vascularity. Questionable density is noted over the anterior lung bases on lateral view. Follow-up PA lateral chest x-ray is suggested. This density persists CT can be obtained. IMPRESSION: 1. Questionable density is noted over the anterior aspect of the lower lung bases on lateral view. Follow-up chest x-ray suggested for further evaluation. This density persists nonenhanced chest CT is suggested to further evaluate. 2. Cardiomegaly.  No pulmonary venous congestion. Electronically Signed   By: Marcello Moores  Register   On: 09/22/2016 12:09   US Abdomen Complete  Result Date: 10/04/2016 CLINICAL DATA:  Nausea and vomiting, remote cholecystectomy, chronic renal disease EXAM: ABDOMEN ULTRASOUND COMPLETE COMPARISON:  09/19/2016, 09/21/2017 FINDINGS: Gallbladder: Surgically absent Common bile duct: Diameter: 2.4 mm, difficult to visualize because of bowel gas. Liver: Limited assessment of  the left lobe secondary to bowel gas. Imaged portion of the liver demonstrates no definite focal abnormality or biliary dilatation. Patent portal vein with normal hepatopetal flow. IVC: Limited visualization because of body habitus Pancreas: Visualized portion unremarkable. Spleen: Size and appearance within normal limits. Right Kidney: Length: 8.8 cm. Cortical thinning. Normal echogenicity. No hydronephrosis. No significant focal abnormality Left Kidney: Length: 9.2 cm. Similar cortical thinning. No definite focal abnormality or hydronephrosis Abdominal aorta: No aneurysm visualized. Other findings: None. IMPRESSION: Limited because of body habitus and bowel gas. Remote cholecystectomy No definite acute intra-abdominal finding by ultrasound Electronically Signed   By: Jerilynn Mages.  Shick M.D.   On: 10/04/2016 10:06   US Biopsy  Result Date: 10/02/2016 CLINICAL DATA:  Acute on chronic kidney disease and need for random renal biopsy. EXAM: ULTRASOUND GUIDED CORE BIOPSY OF RIGHT KIDNEY MEDICATIONS: 2.0 mg IV Versed; 75 mcg IV Fentanyl Total Moderate Sedation Time: 15 minutes. The patient's level of consciousness and physiologic status were continuously monitored during the procedure by Radiology nursing. PROCEDURE: The procedure, risks, benefits, and alternatives were explained to the patient. Questions regarding the procedure were encouraged and answered. The patient understands and consents to the procedure. A time out was performed prior to initiating the procedure. The right flank region was prepped with chlorhexidine in a sterile fashion, and a sterile drape was applied covering the operative field. A sterile gown and sterile gloves were used for the procedure. Local anesthesia was provided with 1% Lidocaine. Initial ultrasound was used to inspect both kidneys.  The right was chosen for biopsy. Under ultrasound guidance, 2 separate 16 gauge core biopsy passes were made into lower pole cortex. Material was submitted in  saline. Post biopsy imaging was performed with ultrasound. COMPLICATIONS: None. FINDINGS: Visualization of the right kidney was better by ultrasound. The left kidney also demonstrated more significant lower pole cortical thinning compared to the right. Solid tissue was obtained with biopsy. IMPRESSION: Ultrasound-guided core biopsy performed of the right kidney at the level of lower pole cortex. Electronically Signed   By: Aletta Edouard M.D.   On: 10/02/2016 11:59   Ir Fluoro Guide Cv Line Right  Result Date: 09/29/2016 INDICATION: 61 year old female in need of hemodialysis access. EXAM: IR RIGHT FLOURO GUIDE CV LINE; IR ULTRASOUND GUIDANCE VASC ACCESS RIGHT MEDICATIONS: None ANESTHESIA/SEDATION: None FLUOROSCOPY TIME:  Fluoroscopy Time: 0 minutes 18 seconds (11 mGy). COMPLICATIONS: None immediate. PROCEDURE: Informed written consent was obtained from the patient after a thorough discussion of the procedural risks, benefits and alternatives. All questions were addressed. Maximal Sterile Barrier Technique was utilized including caps, mask, sterile gowns, sterile gloves, sterile drape, hand hygiene and skin antiseptic. A timeout was performed prior to the initiation of the procedure. The right internal jugular vein was interrogated with ultrasound and found to be widely patent. An image was obtained and stored for the medical record. Local anesthesia was attained by infiltration with 1% lidocaine. A small dermatotomy was made. Under real-time sonographic guidance, the vessel was punctured with an 18 gauge needle. An Amplatz wire was advanced into the right heart and into the inferior vena cava. The needle was removed over the wire and a 20 cm Mahurkar non tunneled hemodialysis catheter with an additional central venous access port was advanced over the wire and position with the tip in the upper right atrium. The wire was removed. The catheter was secured to the skin with 0 Prolene suture. The catheter was found  to work well and was flushed with heparinized saline. Sterile bandages were applied. The patient tolerated the procedure well. IMPRESSION: Successful placement of a 20 cm Mahurkar non tunneled hemodialysis catheter with additional central venous access port via a right internal jugular vein approach. The catheter tips are in the upper right atrium and ready for immediate use. Electronically Signed   By: Jacqulynn Cadet M.D.   On: 09/29/2016 15:30   Ir US Guide Vasc Access Right  Result Date: 09/29/2016 INDICATION: 61 year old female in need of hemodialysis access. EXAM: IR RIGHT FLOURO GUIDE CV LINE; IR ULTRASOUND GUIDANCE VASC ACCESS RIGHT MEDICATIONS: None ANESTHESIA/SEDATION: None FLUOROSCOPY TIME:  Fluoroscopy Time: 0 minutes 18 seconds (11 mGy). COMPLICATIONS: None immediate. PROCEDURE: Informed written consent was obtained from the patient after a thorough discussion of the procedural risks, benefits and alternatives. All questions were addressed. Maximal Sterile Barrier Technique was utilized including caps, mask, sterile gowns, sterile gloves, sterile drape, hand hygiene and skin antiseptic. A timeout was performed prior to the initiation of the procedure. The right internal jugular vein was interrogated with ultrasound and found to be widely patent. An image was obtained and stored for the medical record. Local anesthesia was attained by infiltration with 1% lidocaine. A small dermatotomy was made. Under real-time sonographic guidance, the vessel was punctured with an 18 gauge needle. An Amplatz wire was advanced into the right heart and into the inferior vena cava. The needle was removed over the wire and a 20 cm Mahurkar non tunneled hemodialysis catheter with an additional central venous access port was advanced over  the wire and position with the tip in the upper right atrium. The wire was removed. The catheter was secured to the skin with 0 Prolene suture. The catheter was found to work well and  was flushed with heparinized saline. Sterile bandages were applied. The patient tolerated the procedure well. IMPRESSION: Successful placement of a 20 cm Mahurkar non tunneled hemodialysis catheter with additional central venous access port via a right internal jugular vein approach. The catheter tips are in the upper right atrium and ready for immediate use. Electronically Signed   By: Jacqulynn Cadet M.D.   On: 09/29/2016 15:30     Subjective:  Discharge Exam: Vitals:   10/13/16 2117 10/13/16 2204 10/14/16 0459 10/14/16 0912  BP: (!) 123/59  124/67   Pulse: 78  66 74  Resp: 18  16 18   Temp: 73.7 F (37.1 C)  98.6 F (37 C) 98 F (36.7 C)  TempSrc:    Oral  SpO2: 95%  97% 98%  Weight:  98.9 kg (218 lb)    Height:       General: Pt is alert, awake, not in acute distress Cardiovascular: RRR, S1/S2 +, no rubs, no gallops Respiratory: CTA bilaterally, no wheezing, no rhonchi Abdominal: Soft, NT, ND, bowel sounds + Extremities: no edema, no cyanosis   The results of significant diagnostics from this hospitalization (including imaging, microbiology, ancillary and laboratory) are listed below for reference.    Microbiology: No results found for this or any previous visit (from the past 240 hour(s)).   Labs: BNP (last 3 results) No results for input(s): BNP in the last 8760 hours. Basic Metabolic Panel:  Recent Labs Lab 10/10/16 0703 10/11/16 0422 10/12/16 0450 10/13/16 0510 10/14/16 0633  NA 138 139 140 138 138  K 2.9* 3.6 3.7 3.5 3.6  CL 104 105 110 107 107  CO2 22 22 22  19* 17*  GLUCOSE 97 128* 94 101* 88  BUN 38* 43* 49* 54* 56*  CREATININE 5.28* 5.68* 5.68* 5.89* 5.66*  CALCIUM 8.9 8.8* 9.1 9.1 9.2  PHOS 4.8* 3.9 5.2* 5.1* 5.2*   Liver Function Tests:  Recent Labs Lab 10/10/16 0703 10/11/16 0422 10/12/16 0450 10/13/16 0510 10/14/16 0633  ALBUMIN 3.1* 2.9* 3.0* 3.0* 3.0*   No results for input(s): LIPASE, AMYLASE in the last 168 hours. No results for  input(s): AMMONIA in the last 168 hours. CBC:  Recent Labs Lab 10/07/16 1418  WBC 5.5  HGB 9.7*  HCT 30.1*  MCV 79.8  PLT 115*   Cardiac Enzymes: No results for input(s): CKTOTAL, CKMB, CKMBINDEX, TROPONINI in the last 168 hours. BNP: Invalid input(s): POCBNP CBG: No results for input(s): GLUCAP in the last 168 hours. D-Dimer No results for input(s): DDIMER in the last 72 hours. Hgb A1c No results for input(s): HGBA1C in the last 72 hours. Lipid Profile No results for input(s): CHOL, HDL, LDLCALC, TRIG, CHOLHDL, LDLDIRECT in the last 72 hours. Thyroid function studies No results for input(s): TSH, T4TOTAL, T3FREE, THYROIDAB in the last 72 hours.  Invalid input(s): FREET3 Anemia work up No results for input(s): VITAMINB12, FOLATE, FERRITIN, TIBC, IRON, RETICCTPCT in the last 72 hours. Urinalysis    Component Value Date/Time   COLORURINE COLORLESS (A) 09/22/2016 1802   APPEARANCEUR CLEAR 09/22/2016 1802   LABSPEC 1.005 09/22/2016 1802   PHURINE 7.0 09/22/2016 1802   GLUCOSEU >=500 (A) 09/22/2016 1802   HGBUR SMALL (A) 09/22/2016 1802   BILIRUBINUR NEGATIVE 09/22/2016 [place] 09/22/2016 1802  PROTEINUR NEGATIVE 09/22/2016 1802   NITRITE NEGATIVE 09/22/2016 1802   LEUKOCYTESUR TRACE (A) 09/22/2016 1802   Sepsis Labs Invalid input(s): PROCALCITONIN,  WBC,  LACTICIDVEN Microbiology No results found for this or any previous visit (from the past 240 hour(s)).   Time coordinating discharge: Over 30 minutes  SIGNED:   Birdie Hopes, MD  [facility] 10/14/2016, 10:25 AM Pager   If 7PM-7AM, please contact night-coverage [email or web address] Password [email or web address]

## 2016-10-14 NOTE — Progress Notes (Signed)
Patient discharge teaching given, including activity, diet, follow-up appoints, and medications. Patient verbalized understanding of all discharge instructions. IV access was d/c'd. Vitals are stable. Skin is intact except as charted in most recent assessments. Pt to be escorted out by NT, to be driven home by family.  Bowden Boody, MBA, BSN, RN 

## 2016-10-20 DIAGNOSIS — N183 Chronic kidney disease, stage 3 (moderate): Secondary | ICD-10-CM | POA: Diagnosis not present

## 2016-10-20 DIAGNOSIS — D631 Anemia in chronic kidney disease: Secondary | ICD-10-CM | POA: Diagnosis not present

## 2016-10-21 DIAGNOSIS — D729 Disorder of white blood cells, unspecified: Secondary | ICD-10-CM | POA: Diagnosis not present

## 2016-10-23 ENCOUNTER — Emergency Department (HOSPITAL_COMMUNITY): Payer: Medicare HMO

## 2016-10-23 ENCOUNTER — Telehealth: Payer: Self-pay | Admitting: Nephrology

## 2016-10-23 ENCOUNTER — Inpatient Hospital Stay (HOSPITAL_COMMUNITY)
Admission: EM | Admit: 2016-10-23 | Discharge: 2016-10-30 | DRG: 315 | Disposition: A | Discharge status: Home / Self Care | Payer: Medicare HMO | Attending: Internal Medicine | Admitting: Internal Medicine

## 2016-10-23 ENCOUNTER — Encounter (HOSPITAL_COMMUNITY): Payer: Self-pay

## 2016-10-23 DIAGNOSIS — Z79899 Other long term (current) drug therapy: Secondary | ICD-10-CM

## 2016-10-23 DIAGNOSIS — Z882 Allergy status to sulfonamides status: Secondary | ICD-10-CM | POA: Diagnosis not present

## 2016-10-23 DIAGNOSIS — E876 Hypokalemia: Secondary | ICD-10-CM | POA: Diagnosis not present

## 2016-10-23 DIAGNOSIS — F329 Major depressive disorder, single episode, unspecified: Secondary | ICD-10-CM | POA: Diagnosis present

## 2016-10-23 DIAGNOSIS — Y712 Prosthetic and other implants, materials and accessory cardiovascular devices associated with adverse incidents: Secondary | ICD-10-CM | POA: Diagnosis not present

## 2016-10-23 DIAGNOSIS — N390 Urinary tract infection, site not specified: Secondary | ICD-10-CM | POA: Diagnosis not present

## 2016-10-23 DIAGNOSIS — R0602 Shortness of breath: Secondary | ICD-10-CM | POA: Diagnosis not present

## 2016-10-23 DIAGNOSIS — N185 Chronic kidney disease, stage 5: Secondary | ICD-10-CM | POA: Diagnosis not present

## 2016-10-23 DIAGNOSIS — A4902 Methicillin resistant Staphylococcus aureus infection, unspecified site: Secondary | ICD-10-CM | POA: Diagnosis not present

## 2016-10-23 DIAGNOSIS — I340 Nonrheumatic mitral (valve) insufficiency: Secondary | ICD-10-CM | POA: Diagnosis not present

## 2016-10-23 DIAGNOSIS — Z7952 Long term (current) use of systemic steroids: Secondary | ICD-10-CM | POA: Diagnosis not present

## 2016-10-23 DIAGNOSIS — Z7902 Long term (current) use of antithrombotics/antiplatelets: Secondary | ICD-10-CM | POA: Diagnosis not present

## 2016-10-23 DIAGNOSIS — I10 Essential (primary) hypertension: Secondary | ICD-10-CM | POA: Diagnosis present

## 2016-10-23 DIAGNOSIS — E785 Hyperlipidemia, unspecified: Secondary | ICD-10-CM | POA: Diagnosis not present

## 2016-10-23 DIAGNOSIS — N183 Chronic kidney disease, stage 3 (moderate): Secondary | ICD-10-CM | POA: Diagnosis not present

## 2016-10-23 DIAGNOSIS — Y838 Other surgical procedures as the cause of abnormal reaction of the patient, or of later complication, without mention of misadventure at the time of the procedure: Secondary | ICD-10-CM | POA: Diagnosis present

## 2016-10-23 DIAGNOSIS — Z7901 Long term (current) use of anticoagulants: Secondary | ICD-10-CM

## 2016-10-23 DIAGNOSIS — N179 Acute kidney failure, unspecified: Secondary | ICD-10-CM | POA: Diagnosis present

## 2016-10-23 DIAGNOSIS — Z8673 Personal history of transient ischemic attack (TIA), and cerebral infarction without residual deficits: Secondary | ICD-10-CM

## 2016-10-23 DIAGNOSIS — N10 Acute tubulo-interstitial nephritis: Secondary | ICD-10-CM | POA: Diagnosis not present

## 2016-10-23 DIAGNOSIS — Z888 Allergy status to other drugs, medicaments and biological substances status: Secondary | ICD-10-CM

## 2016-10-23 DIAGNOSIS — D649 Anemia, unspecified: Secondary | ICD-10-CM | POA: Diagnosis not present

## 2016-10-23 DIAGNOSIS — F320 Major depressive disorder, single episode, mild: Secondary | ICD-10-CM | POA: Diagnosis not present

## 2016-10-23 DIAGNOSIS — I132 Hypertensive heart and chronic kidney disease with heart failure and with stage 5 chronic kidney disease, or end stage renal disease: Secondary | ICD-10-CM | POA: Diagnosis not present

## 2016-10-23 DIAGNOSIS — F32A Depression, unspecified: Secondary | ICD-10-CM | POA: Diagnosis present

## 2016-10-23 DIAGNOSIS — D638 Anemia in other chronic diseases classified elsewhere: Secondary | ICD-10-CM | POA: Diagnosis present

## 2016-10-23 DIAGNOSIS — B957 Other staphylococcus as the cause of diseases classified elsewhere: Secondary | ICD-10-CM | POA: Diagnosis not present

## 2016-10-23 DIAGNOSIS — T827XXA Infection and inflammatory reaction due to other cardiac and vascular devices, implants and grafts, initial encounter: Secondary | ICD-10-CM

## 2016-10-23 DIAGNOSIS — I480 Paroxysmal atrial fibrillation: Secondary | ICD-10-CM | POA: Diagnosis present

## 2016-10-23 DIAGNOSIS — R002 Palpitations: Secondary | ICD-10-CM | POA: Diagnosis not present

## 2016-10-23 DIAGNOSIS — N12 Tubulo-interstitial nephritis, not specified as acute or chronic: Secondary | ICD-10-CM

## 2016-10-23 DIAGNOSIS — E039 Hypothyroidism, unspecified: Secondary | ICD-10-CM | POA: Diagnosis present

## 2016-10-23 DIAGNOSIS — I120 Hypertensive chronic kidney disease with stage 5 chronic kidney disease or end stage renal disease: Secondary | ICD-10-CM | POA: Diagnosis present

## 2016-10-23 DIAGNOSIS — T827XXD Infection and inflammatory reaction due to other cardiac and vascular devices, implants and grafts, subsequent encounter: Secondary | ICD-10-CM | POA: Diagnosis not present

## 2016-10-23 DIAGNOSIS — Z880 Allergy status to penicillin: Secondary | ICD-10-CM | POA: Diagnosis not present

## 2016-10-23 DIAGNOSIS — Z8249 Family history of ischemic heart disease and other diseases of the circulatory system: Secondary | ICD-10-CM

## 2016-10-23 DIAGNOSIS — I509 Heart failure, unspecified: Secondary | ICD-10-CM | POA: Diagnosis not present

## 2016-10-23 DIAGNOSIS — R7881 Bacteremia: Secondary | ICD-10-CM | POA: Diagnosis present

## 2016-10-23 DIAGNOSIS — R079 Chest pain, unspecified: Secondary | ICD-10-CM | POA: Diagnosis not present

## 2016-10-23 DIAGNOSIS — G9389 Other specified disorders of brain: Secondary | ICD-10-CM | POA: Diagnosis not present

## 2016-10-23 HISTORY — DX: Chronic kidney disease, stage 5: N18.5

## 2016-10-23 HISTORY — DX: Bacteremia: R78.81

## 2016-10-23 LAB — CBC WITH DIFFERENTIAL/PLATELET
Basophils Absolute: 0 10*3/uL (ref 0.0–0.1)
Basophils Relative: 0 %
EOS ABS: 0 10*3/uL (ref 0.0–0.7)
EOS PCT: 0 %
HCT: 34.1 % — ABNORMAL LOW (ref 36.0–46.0)
Hemoglobin: 11.1 g/dL — ABNORMAL LOW (ref 12.0–15.0)
LYMPHS PCT: 4 %
Lymphs Abs: 0.8 10*3/uL (ref 0.7–4.0)
MCH: 26.6 pg (ref 26.0–34.0)
MCHC: 32.6 g/dL (ref 30.0–36.0)
MCV: 81.6 fL (ref 78.0–100.0)
Monocytes Absolute: 0.3 10*3/uL (ref 0.1–1.0)
Monocytes Relative: 1 %
Neutro Abs: 18.9 10*3/uL — ABNORMAL HIGH (ref 1.7–7.7)
Neutrophils Relative %: 95 %
PLATELETS: 214 10*3/uL (ref 150–400)
RBC: 4.18 MIL/uL (ref 3.87–5.11)
RDW: 18.3 % — ABNORMAL HIGH (ref 11.5–15.5)
WBC: 20 10*3/uL — AB (ref 4.0–10.5)

## 2016-10-23 LAB — URINALYSIS, ROUTINE W REFLEX MICROSCOPIC
Bilirubin Urine: NEGATIVE
Ketones, ur: NEGATIVE mg/dL
NITRITE: NEGATIVE
Protein, ur: 30 mg/dL — AB
SPECIFIC GRAVITY, URINE: 1.014 (ref 1.005–1.030)
pH: 5 (ref 5.0–8.0)

## 2016-10-23 LAB — PROTIME-INR
INR: 1.07
Prothrombin Time: 13.9 seconds (ref 11.4–15.2)

## 2016-10-23 LAB — COMPREHENSIVE METABOLIC PANEL
ALK PHOS: 49 U/L (ref 38–126)
ALT: 21 U/L (ref 14–54)
AST: 20 U/L (ref 15–41)
Albumin: 3.8 g/dL (ref 3.5–5.0)
Anion gap: 15 (ref 5–15)
BUN: 45 mg/dL — ABNORMAL HIGH (ref 6–20)
CALCIUM: 9.1 mg/dL (ref 8.9–10.3)
CO2: 16 mmol/L — AB (ref 22–32)
Chloride: 107 mmol/L (ref 101–111)
Creatinine, Ser: 3.51 mg/dL — ABNORMAL HIGH (ref 0.44–1.00)
GFR calc non Af Amer: 13 mL/min — ABNORMAL LOW (ref >60)
GFR, EST AFRICAN AMERICAN: 15 mL/min — AB (ref >60)
Glucose, Bld: 231 mg/dL — ABNORMAL HIGH (ref 65–99)
POTASSIUM: 4.1 mmol/L (ref 3.5–5.1)
SODIUM: 138 mmol/L (ref 135–145)
TOTAL PROTEIN: 7.1 g/dL (ref 6.5–8.1)
Total Bilirubin: 0.9 mg/dL (ref 0.3–1.2)

## 2016-10-23 LAB — I-STAT CG4 LACTIC ACID, ED: Lactic Acid, Venous: 3.02 mmol/L (ref 0.5–1.9)

## 2016-10-23 MED ORDER — SODIUM CHLORIDE 0.9 % IV BOLUS (SEPSIS)
1000.0000 mL | Freq: Once | INTRAVENOUS | Status: AC
  Administered 2016-10-23: 1000 mL via INTRAVENOUS

## 2016-10-23 MED ORDER — DEXTROSE 5 % IV SOLN: 2.0000 g | Freq: Once | INTRAVENOUS | Status: DC

## 2016-10-23 MED ORDER — SODIUM CHLORIDE 0.9 % IV SOLN
2000.0000 mg | Freq: Once | INTRAVENOUS | Status: AC
  Administered 2016-10-23: 2000 mg via INTRAVENOUS
  Filled 2016-10-23: qty 2000

## 2016-10-23 MED ORDER — DEXTROSE 5 % IV SOLN
1.0000 g | INTRAVENOUS | Status: DC
  Filled 2016-10-23: qty 1

## 2016-10-23 MED ORDER — VANCOMYCIN HCL IN DEXTROSE 1-5 GM/200ML-% IV SOLN: 1000.0000 mg | Freq: Once | INTRAVENOUS | Status: DC

## 2016-10-23 MED ORDER — DEXTROSE 5 % IV SOLN
2.0000 g | Freq: Once | INTRAVENOUS | Status: AC
  Administered 2016-10-23: 2 g via INTRAVENOUS
  Filled 2016-10-23: qty 2

## 2016-10-23 MED ORDER — LEVOFLOXACIN IN D5W 750 MG/150ML IV SOLN: 750.0000 mg | Freq: Once | INTRAVENOUS | Status: DC

## 2016-10-23 MED ORDER — SODIUM CHLORIDE 0.9 % IV BOLUS (SEPSIS)
1000.0000 mL | Freq: Once | INTRAVENOUS | Status: AC
  Administered 2016-10-24: 1000 mL via INTRAVENOUS

## 2016-10-23 NOTE — ED Triage Notes (Signed)
Pt states that she was seen at PCP on Tuesday and had blood cultures done, was told today she has a blood infection and sent her to be admitted, pt is on dialysis.

## 2016-10-23 NOTE — Telephone Encounter (Signed)
Outpatient labs obtained 10/20/16 via [facility].  Cr of 3.94 with WBC ct of 20.4.    Discharged with permcath.  Reported chills.  I asked pt to get blood cultures which she did.    Blood cultures called to our office 2/15 growing GPCs in clusters.  Advised pt to come to [facility] ED for likely admission for treatment of bacteremia.    Will need repeat set of blood cultures, basic labs, antibiotics, removal of TDC, and rule out endocarditis.    Please note she is also on steroids for the treatment of acute interstitial nephritis secondary to proton pump inhibitor.  Please do not hesitate to contact me with questions.  I plan to round on her while she is inpatient.  Madelon Lips MD [facility] pgr [phone]

## 2016-10-23 NOTE — Progress Notes (Signed)
Pharmacy Antibiotic Note Rachael [name] is a 61 y.o. female admitted on 10/23/2016 with reported gram positive cocci bacteremia from outpatient cultures. Noted history for AIN presumed secondary to PPI that required 4 intermittent hemodialysis during recent admission (none during outpatient). Permcath currently in place but planning for removal. Pharmacy has been consulted for Cefepime and vancomycin dosing.  Plan: 1. Cefepime 1 gram IV every 24 hours 2. Vancomycin 2000 mg loading dose x 1 now;  Await nephrology plans and order further doses/levels as needed 3. Await final micro data and scale back abx as feasible   Height: 5\' 2"  (157.5 cm) Weight: 215 lb (97.5 kg) IBW/kg (Calculated) : 50.1  Temp (24hrs), Avg:98 F (36.7 C), Min:98 F (36.7 C), Max:98 F (36.7 C)   Recent Labs Lab 10/23/16 2055 10/23/16 2111  WBC 20.0*  --   LATICACIDVEN  --  3.02*    Estimated Creatinine Clearance: 11.2 mL/min (by C-G formula based on SCr of 5.66 mg/dL (H)).    Allergies  Allergen Reactions  . Penicillins Rash and Other (See Comments)    Tolerates rocephin  . Sulfa Antibiotics Rash    Antimicrobials this admission: 2/15 Cefepime  >>  2/15 vancomycin  >>   Dose adjustments this admission: n/a  Microbiology results: 2/12 BCx: GPC (from outpt [facility]) 2/15 BCx: pending   Vincenza Hews, PharmD, BCPS 10/23/2016, 9:55 PM

## 2016-10-23 NOTE — ED Notes (Signed)
Critical lab - reported to MD, will be next in room.

## 2016-10-23 NOTE — ED Provider Notes (Signed)
[facility] DEPT Provider Note   CSN: [id number] Arrival date & time: 10/23/16  2037     History   Chief Complaint Chief Complaint  Patient presents with  . Blood Infection    HPI Rachael [name] is a 61 y.o. female with a past medical history significant for CK D, CHF, CVA, hypertension, and recent acute interstitial nephritis status post several rounds of dialysis with dialysis catheter still in place in her right chest who presents at the direction of her nephrologist for bacteremia. Patient reports that discharged on February 6 after a stay for kidney failure. She says that since discharge, she is continued to have fevers, chills, fatigue, and generalized weakness. She denies any URI-like symptoms such as rhinorrhea, congestion, or cough. She denies nausea, vomiting, constipation, diarrhea, denies dysuria. She says she is urinating well. She denies any pain in her chest or abdomen. She denies any rashes. She denies a discomfort at the site of her dialysis catheter.  Patient says that due to the fatigue and chills, she went to her nephrologist several days ago and had blood work done. Patient was found to have a leukocytosis and elevated lactic acid by report. Patient also had cultures obtained which grew gram-positive cocci in clusters. Patient was then called and told to come to the ED for likely sepsis.  Patient had no other complaints on arrival.  HPI  Past Medical History:  Diagnosis Date  . CHF (congestive heart failure) ([place])    Heart cath in 2006  . CKD (chronic kidney disease)    Baseline creat 1.4-1.7  . CVA (cerebral vascular accident) ([place])    No residual deficit  . HTN (hypertension)   . Hypothyroidism     Patient Active Problem List   Diagnosis Date Noted  . Acute renal failure superimposed on chronic kidney disease ([place]) 09/22/2016  . UTI (urinary tract infection) 09/22/2016  . Nausea and vomiting 09/22/2016  . HTN (hypertension) 09/22/2016  . Hypothyroidism  09/22/2016  . AKI (acute kidney injury) ([place]) 09/22/2016  . Pruritus 05/16/2015  . Urticaria 05/16/2015    Past Surgical History:  Procedure Laterality Date  . CARDIAC CATHETERIZATION  2006  . CHOLECYSTECTOMY    . IR GENERIC HISTORICAL  09/29/2016   IR FLUORO GUIDE CV LINE RIGHT 09/29/2016 Jacqulynn Cadet, MD [facility]  . IR GENERIC HISTORICAL  09/29/2016   IR US GUIDE VASC ACCESS RIGHT [facility]    OB History    No data available       Home Medications    Prior to Admission medications   Medication Sig Start Date End Date Taking? Authorizing Provider  atorvastatin (LIPITOR) 40 MG tablet Take 40 mg by mouth daily.     Historical Provider, MD  citalopram (CELEXA) 40 MG tablet Take 40 mg by mouth daily.    Historical Provider, MD  clopidogrel (PLAVIX) 75 MG tablet Take 75 mg by mouth daily.    Historical Provider, MD  famotidine (PEPCID) 20 MG tablet Take 1 tablet (20 mg total) by mouth 2 (two) times daily. 10/14/16   Verlee Monte, MD  levothyroxine (SYNTHROID, LEVOTHROID) 100 MCG tablet Take 100 mcg by mouth daily before breakfast.    Historical Provider, MD  predniSONE (DELTASONE) 20 MG tablet Take 2 tablets (40 mg total) by mouth daily with breakfast. 10/15/16   Verlee Monte, MD    Family History Family History  Problem Relation Age of Onset  . Hypertension Mother   . Stroke Mother   .  Coronary artery disease Mother     Late onset  . Hypertension Father   . Coronary artery disease Father     Late onset  . Hypertension Sister   . Hypertension Brother     Social History Social History  Substance Use Topics  . Smoking status: Never Smoker  . Smokeless tobacco: Never Used  . Alcohol use No     Allergies   Penicillins and Sulfa antibiotics   Review of Systems Review of Systems  Constitutional: Positive for activity change, chills, fatigue and fever. Negative for diaphoresis.  HENT: Negative for congestion and rhinorrhea.   Eyes: Negative for visual  disturbance.  Respiratory: Negative for cough, chest tightness, shortness of breath and stridor.   Cardiovascular: Negative for chest pain, palpitations and leg swelling.  Gastrointestinal: Negative for abdominal distention, abdominal pain, constipation, diarrhea, nausea and vomiting.  Genitourinary: Negative for difficulty urinating, dysuria, flank pain, frequency, hematuria, menstrual problem, pelvic pain, vaginal bleeding and vaginal discharge.  Musculoskeletal: Negative for back pain and neck pain.  Skin: Negative for rash and wound.  Neurological: Positive for weakness (generaluized). Negative for dizziness, light-headedness, numbness and headaches.  Psychiatric/Behavioral: Negative for agitation and confusion.  All other systems reviewed and are negative.    Physical Exam Updated Vital Signs BP 122/99 (BP Location: Left Arm)   Pulse 112   Temp 98 F (36.7 C) (Oral)   Resp 18   Ht 5\' 2"  (1.575 m)   Wt 215 lb (97.5 kg)   SpO2 97%   BMI 39.32 kg/m   Physical Exam  Constitutional: She appears well-developed and well-nourished. No distress.  HENT:  Head: Normocephalic and atraumatic.  Mouth/Throat: Oropharynx is clear and moist. No oropharyngeal exudate.  Eyes: Conjunctivae are normal. Pupils are equal, round, and reactive to light. No scleral icterus.  Neck: Neck supple.  Cardiovascular: Normal rate and regular rhythm.   No murmur heard. Pulmonary/Chest: Effort normal and breath sounds normal. No respiratory distress. She has no wheezes. She has no rales. She exhibits no tenderness.    Abdominal: Soft. There is no tenderness.  Musculoskeletal: She exhibits no edema or tenderness.  Neurological: She is alert. No sensory deficit. She exhibits normal muscle tone.  Skin: Skin is warm and dry. Capillary refill takes less than 2 seconds. No rash noted. No erythema. No pallor.  Psychiatric: She has a normal mood and affect.  Nursing note and vitals reviewed.    ED Treatments  / Results  Labs (all labs ordered are listed, but only abnormal results are displayed) Labs Reviewed  COMPREHENSIVE METABOLIC PANEL - Abnormal; Notable for the following:       Result Value   CO2 16 (*)    Glucose, Bld 231 (*)    BUN 45 (*)    Creatinine, Ser 3.51 (*)    GFR calc non Af Amer 13 (*)    GFR calc Af Amer 15 (*)    All other components within normal limits  CBC WITH DIFFERENTIAL/PLATELET - Abnormal; Notable for the following:    WBC 20.0 (*)    Hemoglobin 11.1 (*)    HCT 34.1 (*)    RDW 18.3 (*)    Neutro Abs 18.9 (*)    All other components within normal limits  URINALYSIS, ROUTINE W REFLEX MICROSCOPIC - Abnormal; Notable for the following:    APPearance CLOUDY (*)    Glucose, UA >=500 (*)    Hgb urine dipstick SMALL (*)    Protein, ur 30 (*)  Leukocytes, UA MODERATE (*)    Bacteria, UA RARE (*)    Squamous Epithelial / LPF 6-30 (*)    All other components within normal limits  I-STAT CG4 LACTIC ACID, ED - Abnormal; Notable for the following:    Lactic Acid, Venous 3.02 (*)    All other components within normal limits  CULTURE, BLOOD (ROUTINE X 2)  CULTURE, BLOOD (ROUTINE X 2)  PROTIME-INR  BASIC METABOLIC PANEL  CBC WITH DIFFERENTIAL/PLATELET  I-STAT CG4 LACTIC ACID, ED  I-STAT CG4 LACTIC ACID, ED  I-STAT CG4 LACTIC ACID, ED    EKG  EKG Interpretation  Date/Time:  Thursday October 23 2016 21:53:11 EST Ventricular Rate:  87 PR Interval:    QRS Duration: 88 QT Interval:  377 QTC Calculation: 454 R Axis:   18 Text Interpretation:  Sinus rhythm Low voltage, precordial leads RSR' in V1 or V2, right VCD or RVH No prior ECG for comparison No STEMI Confirmed by Sherry Ruffing MD, Naida Escalante [phone]) on 10/24/2016 12:24:13 AM       Radiology Dg Chest 2 View  Result Date: 10/23/2016 CLINICAL DATA:  Fever, chest pain, shortness of breath. EXAM: CHEST  2 VIEW COMPARISON:  Radiographs of September 25, 2016. FINDINGS: The heart size and mediastinal contours are  within normal limits. Both lungs are clear. No pneumothorax or pleural effusion is noted. Eventration of the anterior portion of right hemidiaphragm is noted. Interval placement of right internal jugular catheter with distal tip at the expected position of cavoatrial junction. The visualized skeletal structures are unremarkable. IMPRESSION: No active cardiopulmonary disease. Electronically Signed   By: Marijo Conception, M.D.   On: 10/23/2016 21:21    Procedures Procedures (including critical care time)  CRITICAL CARE Performed by: Gwenyth Allegra Bronte Kropf Total critical care time: 35 minutes Critical care time was exclusive of separately billable procedures and treating other patients. Critical care was necessary to treat or prevent imminent or life-threatening deterioration. Critical care was time spent personally by me on the following activities: development of treatment plan with patient and/or surrogate as well as nursing, discussions with consultants, evaluation of patient's response to treatment, examination of patient, obtaining history from patient or surrogate, ordering and performing treatments and interventions, ordering and review of laboratory studies, ordering and review of radiographic studies, pulse oximetry and re-evaluation of patient's condition.   Medications Ordered in ED Medications  sodium chloride 0.9 % bolus 1,000 mL (1,000 mLs Intravenous New Bag/Given 10/23/16 2147)    And  sodium chloride 0.9 % bolus 1,000 mL (1,000 mLs Intravenous New Bag/Given 10/23/16 2204)    And  sodium chloride 0.9 % bolus 1,000 mL (not administered)  ceFEPIme (MAXIPIME) 1 g in dextrose 5 % 50 mL IVPB (not administered)  citalopram (CELEXA) tablet 40 mg (not administered)  clopidogrel (PLAVIX) tablet 75 mg (not administered)  levothyroxine (SYNTHROID, LEVOTHROID) tablet 100 mcg (not administered)  atorvastatin (LIPITOR) tablet 40 mg (not administered)  predniSONE (DELTASONE) tablet 40 mg (not  administered)  famotidine (PEPCID) tablet 20 mg (not administered)  enoxaparin (LOVENOX) injection 30 mg (not administered)  0.9 %  sodium chloride infusion (not administered)  vancomycin (VANCOCIN) 2,000 mg in sodium chloride 0.9 % 500 mL IVPB (2,000 mg Intravenous New Bag/Given 10/23/16 2202)  ceFEPIme (MAXIPIME) 2 g in dextrose 5 % 50 mL IVPB (0 g Intravenous Stopped 10/23/16 2217)     Initial Impression / Assessment and Plan / ED Course  I have reviewed the triage vital signs and the nursing notes.  Pertinent labs & imaging results that were available during my care of the patient were reviewed by me and considered in my medical decision making (see chart for details).     Rachael [name] is a 61 y.o. female with a past medical history significant for CK D, CHF, CVA, hypertension, and recent acute interstitial nephritis status post several rounds of dialysis with dialysis catheter still in place in her right chest who presents at the direction of her nephrologist for bacteremia.  History and exam are seen above. On exam, patient's lungs are clear. Abdomen is nontender. Chest is nontender. Catheter is in place in her right upper chest with no evidence of erythema, crepitance, or fluctuance. No tenderness in the site. No other abnormalities on exam.  Patient had screening laboratory testing done to look for sepsis. Patient found to have lactic acid 3.02, leukocytosis of 20.0, triage, patient was tachycardic.  She was quickly made a code sepsis and other lab testing and imaging was obtained. Chest x-ray shows no evidence of pneumonia. Urinalysis not convincing for UTI given lack of symptoms.  She received broad-spectrum antibiotics, cultures, and the nephrology was called. They report they will see the patient in the morning but patient is committed to hospitalist service.  Hospitalist service was called and patient will be admitted for further management.    Final Clinical Impressions(s) /  ED Diagnoses   Final diagnoses:  Bacteremia    New Prescriptions Current Discharge Medication List      Clinical Impression: 1. Bacteremia     Disposition: Admit to hospitalist service    Courtney Paris, MD 10/24/16 [phone]

## 2016-10-24 ENCOUNTER — Inpatient Hospital Stay (HOSPITAL_COMMUNITY): Payer: Medicare HMO

## 2016-10-24 DIAGNOSIS — Z8673 Personal history of transient ischemic attack (TIA), and cerebral infarction without residual deficits: Secondary | ICD-10-CM

## 2016-10-24 DIAGNOSIS — F32A Depression, unspecified: Secondary | ICD-10-CM | POA: Diagnosis present

## 2016-10-24 DIAGNOSIS — F329 Major depressive disorder, single episode, unspecified: Secondary | ICD-10-CM | POA: Diagnosis present

## 2016-10-24 DIAGNOSIS — R7881 Bacteremia: Secondary | ICD-10-CM

## 2016-10-24 DIAGNOSIS — E785 Hyperlipidemia, unspecified: Secondary | ICD-10-CM | POA: Diagnosis present

## 2016-10-24 DIAGNOSIS — I10 Essential (primary) hypertension: Secondary | ICD-10-CM

## 2016-10-24 HISTORY — DX: Depression, unspecified: F32.A

## 2016-10-24 HISTORY — DX: Hyperlipidemia, unspecified: E78.5

## 2016-10-24 HISTORY — DX: Personal history of transient ischemic attack (TIA), and cerebral infarction without residual deficits: Z86.73

## 2016-10-24 LAB — ECHOCARDIOGRAM COMPLETE
AVLVOTPG: 6 mmHg
CHL CUP DOP CALC LVOT VTI: 29.1 cm
CHL CUP MV DEC (S): 201
EERAT: 6.75
EWDT: 201 ms
FS: 41 % (ref 28–44)
HEIGHTINCHES: 62 in
IV/PV OW: 1.02
LA ID, A-P, ES: 35 mm
LA diam index: 1.75 cm/m2
LA vol A4C: 37.3 ml
LAVOL: 38.1 mL
LAVOLIN: 19.1 mL/m2
LEFT ATRIUM END SYS DIAM: 35 mm
LV E/e' medial: 6.75
LV PW d: 10 mm — AB (ref 0.6–1.1)
LV TDI E'LATERAL: 13.8
LV e' LATERAL: 13.8 cm/s
LVEEAVG: 6.75
LVOT SV: 74 mL
LVOT area: 2.54 cm2
LVOTD: 18 mm
LVOTPV: 126 cm/s
MV Peak grad: 3 mmHg
MV pk A vel: 106 m/s
MVPKEVEL: 93.1 m/s
TAPSE: 25.2 mm
TDI e' medial: 9.36
WEIGHTICAEL: 3545.6 [oz_av]

## 2016-10-24 LAB — BLOOD CULTURE ID PANEL (REFLEXED)
Acinetobacter baumannii: NOT DETECTED
CANDIDA GLABRATA: NOT DETECTED
CANDIDA PARAPSILOSIS: NOT DETECTED
CANDIDA TROPICALIS: NOT DETECTED
Candida albicans: NOT DETECTED
Candida krusei: NOT DETECTED
Enterobacter cloacae complex: NOT DETECTED
Enterobacteriaceae species: NOT DETECTED
Enterococcus species: NOT DETECTED
Escherichia coli: NOT DETECTED
HAEMOPHILUS INFLUENZAE: NOT DETECTED
KLEBSIELLA OXYTOCA: NOT DETECTED
KLEBSIELLA PNEUMONIAE: NOT DETECTED
Listeria monocytogenes: NOT DETECTED
Methicillin resistance: DETECTED — AB
NEISSERIA MENINGITIDIS: NOT DETECTED
PROTEUS SPECIES: NOT DETECTED
Pseudomonas aeruginosa: NOT DETECTED
STAPHYLOCOCCUS SPECIES: DETECTED — AB
STREPTOCOCCUS PYOGENES: NOT DETECTED
STREPTOCOCCUS SPECIES: NOT DETECTED
Serratia marcescens: NOT DETECTED
Staphylococcus aureus (BCID): NOT DETECTED
Streptococcus agalactiae: NOT DETECTED
Streptococcus pneumoniae: NOT DETECTED

## 2016-10-24 LAB — GLUCOSE, CAPILLARY
GLUCOSE-CAPILLARY: 83 mg/dL (ref 65–99)
GLUCOSE-CAPILLARY: 78 mg/dL (ref 65–99)
GLUCOSE-CAPILLARY: 160 mg/dL — AB (ref 65–99)
Glucose-Capillary: 120 mg/dL — ABNORMAL HIGH (ref 65–99)
Glucose-Capillary: 125 mg/dL — ABNORMAL HIGH (ref 65–99)

## 2016-10-24 LAB — CBC WITH DIFFERENTIAL/PLATELET
BASOS PCT: 0 %
Basophils Absolute: 0 10*3/uL (ref 0.0–0.1)
EOS ABS: 0 10*3/uL (ref 0.0–0.7)
EOS PCT: 0 %
HCT: 26.3 % — ABNORMAL LOW (ref 36.0–46.0)
Hemoglobin: 8.4 g/dL — ABNORMAL LOW (ref 12.0–15.0)
LYMPHS ABS: 1.2 10*3/uL (ref 0.7–4.0)
Lymphocytes Relative: 11 %
MCH: 26.4 pg (ref 26.0–34.0)
MCHC: 32.3 g/dL (ref 30.0–36.0)
MCV: 81.7 fL (ref 78.0–100.0)
MONO ABS: 0.7 10*3/uL (ref 0.1–1.0)
MONOS PCT: 6 %
Neutro Abs: 9.2 10*3/uL — ABNORMAL HIGH (ref 1.7–7.7)
Neutrophils Relative %: 83 %
Platelets: 125 10*3/uL — ABNORMAL LOW (ref 150–400)
RBC: 3.22 MIL/uL — ABNORMAL LOW (ref 3.87–5.11)
RDW: 18.2 % — AB (ref 11.5–15.5)
WBC: 11 10*3/uL — ABNORMAL HIGH (ref 4.0–10.5)

## 2016-10-24 LAB — BASIC METABOLIC PANEL
Anion gap: 10 (ref 5–15)
BUN: 42 mg/dL — AB (ref 6–20)
CO2: 12 mmol/L — AB (ref 22–32)
Calcium: 7.4 mg/dL — ABNORMAL LOW (ref 8.9–10.3)
Chloride: 117 mmol/L — ABNORMAL HIGH (ref 101–111)
Creatinine, Ser: 3.04 mg/dL — ABNORMAL HIGH (ref 0.44–1.00)
GFR calc Af Amer: 18 mL/min — ABNORMAL LOW (ref >60)
GFR calc non Af Amer: 15 mL/min — ABNORMAL LOW (ref >60)
Glucose, Bld: 109 mg/dL — ABNORMAL HIGH (ref 65–99)
Potassium: 4 mmol/L (ref 3.5–5.1)
Sodium: 139 mmol/L (ref 135–145)

## 2016-10-24 LAB — LACTIC ACID, PLASMA: LACTIC ACID, VENOUS: 1.2 mmol/L (ref 0.5–1.9)

## 2016-10-24 MED ORDER — DEXTROSE 5 % IV SOLN
2.0000 g | INTRAVENOUS | Status: DC
  Administered 2016-10-24 – 2016-10-25 (×2): 2 g via INTRAVENOUS
  Filled 2016-10-24 (×2): qty 2

## 2016-10-24 MED ORDER — LEVOTHYROXINE SODIUM 100 MCG PO TABS
100.0000 ug | ORAL_TABLET | Freq: Every day | ORAL | Status: DC
  Administered 2016-10-24 – 2016-10-27 (×4): 100 ug via ORAL
  Filled 2016-10-24 (×4): qty 1

## 2016-10-24 MED ORDER — ENOXAPARIN SODIUM 30 MG/0.3ML ~~LOC~~ SOLN
30.0000 mg | SUBCUTANEOUS | Status: DC
  Administered 2016-10-24 – 2016-10-27 (×4): 30 mg via SUBCUTANEOUS
  Filled 2016-10-24 (×4): qty 0.3

## 2016-10-24 MED ORDER — CLOPIDOGREL BISULFATE 75 MG PO TABS
75.0000 mg | ORAL_TABLET | Freq: Every day | ORAL | Status: DC
  Administered 2016-10-24 – 2016-10-30 (×7): 75 mg via ORAL
  Filled 2016-10-24 (×7): qty 1

## 2016-10-24 MED ORDER — PREDNISONE 20 MG PO TABS
40.0000 mg | ORAL_TABLET | Freq: Every day | ORAL | Status: DC
  Administered 2016-10-24 – 2016-10-30 (×7): 40 mg via ORAL
  Filled 2016-10-24 (×8): qty 2

## 2016-10-24 MED ORDER — ATORVASTATIN CALCIUM 40 MG PO TABS
40.0000 mg | ORAL_TABLET | Freq: Every day | ORAL | Status: DC
  Administered 2016-10-24 – 2016-10-25 (×2): 40 mg via ORAL
  Filled 2016-10-24 (×2): qty 1

## 2016-10-24 MED ORDER — VANCOMYCIN HCL IN DEXTROSE 1-5 GM/200ML-% IV SOLN
1000.0000 mg | INTRAVENOUS | Status: DC
  Administered 2016-10-25: 1000 mg via INTRAVENOUS
  Filled 2016-10-24: qty 200

## 2016-10-24 MED ORDER — INSULIN ASPART 100 UNIT/ML ~~LOC~~ SOLN
0.0000 [IU] | Freq: Three times a day (TID) | SUBCUTANEOUS | Status: DC
  Administered 2016-10-24 – 2016-10-25 (×2): 1 [IU] via SUBCUTANEOUS
  Administered 2016-10-26: 2 [IU] via SUBCUTANEOUS
  Administered 2016-10-28: 1 [IU] via SUBCUTANEOUS
  Administered 2016-10-28: 2 [IU] via SUBCUTANEOUS
  Administered 2016-10-29: 1 [IU] via SUBCUTANEOUS
  Administered 2016-10-29: 2 [IU] via SUBCUTANEOUS
  Administered 2016-10-30: 1 [IU] via SUBCUTANEOUS

## 2016-10-24 MED ORDER — SODIUM CHLORIDE 0.9 % IV SOLN
INTRAVENOUS | Status: AC
  Administered 2016-10-24 (×3): via INTRAVENOUS

## 2016-10-24 MED ORDER — FAMOTIDINE 20 MG PO TABS
20.0000 mg | ORAL_TABLET | Freq: Every day | ORAL | Status: DC
  Administered 2016-10-24 – 2016-10-30 (×7): 20 mg via ORAL
  Filled 2016-10-24 (×7): qty 1

## 2016-10-24 MED ORDER — CITALOPRAM HYDROBROMIDE 40 MG PO TABS
40.0000 mg | ORAL_TABLET | Freq: Every day | ORAL | Status: DC
  Administered 2016-10-24 – 2016-10-25 (×2): 40 mg via ORAL
  Filled 2016-10-24 (×2): qty 1

## 2016-10-24 NOTE — H&P (Signed)
History and Physical    Rachael [name] PTW:[id number] DOB: 1956/04/10 DOA: 10/23/2016  PCP: Ronita Hipps, MD   Patient coming from: Home.  Chief Complaint: Positive blood cultures.  HPI: Rachael [name] is a 61 y.o. female with medical history significant of CHF, chronic kidney disease, CVA without residual deficit, hypertension, hypothyroidism, hyperlipidemia who was recently discharged for acute interstitial nephritis on 10/14/2017 (on prednisone 40 mg a day) who is coming to the emergency department referred by her nephrologist after the patient's recently drawn blood cultures grew gram-positive cocci in clusters. The patient stated that she has been experiencing fevers, fatigue, night sweats and chills at home. She denies earache, sore throat or rhinorrhea. She denies chest pain, palpitations, dizziness, diaphoresis, productive cough, abdominal pain, nausea, emesis, diarrhea, constipation, melena or hematochezia. She denies dysuria or frequency.  ED Course: The patient was given normal saline fluid boluses, cefepime and vancomycin in the emergency department. Her urine analysis showed moderate bacteria, 6-30 WBC and glucosuria of 500 mg/dL. WBC 20, hemoglobin 11.1 g/dL and platelets 214. Lactic acid 3.02, sodium 138, potassium 4.1, chloride 107, bicarbonate 16 mmol/L. Her BUN was 45, creatinine 3.51 glucose 231.  Review of Systems: As per HPI otherwise 10 point review of systems negative.    Past Medical History:  Diagnosis Date  . CHF (congestive heart failure) ([place])    Heart cath in 2006  . CKD (chronic kidney disease)    Baseline creat 1.4-1.7  . CVA (cerebral vascular accident) ([place])    No residual deficit  . HTN (hypertension)   . Hypothyroidism     Past Surgical History:  Procedure Laterality Date  . CARDIAC CATHETERIZATION  2006  . CHOLECYSTECTOMY    . IR GENERIC HISTORICAL  09/29/2016   IR FLUORO GUIDE CV LINE RIGHT 09/29/2016 Jacqulynn Cadet, MD [facility]  . IR GENERIC  HISTORICAL  09/29/2016   IR US GUIDE VASC ACCESS RIGHT [facility]     reports that she has never smoked. She has never used smokeless tobacco. She reports that she does not drink alcohol or use drugs.  Allergies  Allergen Reactions  . Penicillins Rash and Other (See Comments)    Tolerates rocephin  . Sulfa Antibiotics Rash    Family History  Problem Relation Age of Onset  . Hypertension Mother   . Stroke Mother   . Coronary artery disease Mother     Late onset  . Hypertension Father   . Coronary artery disease Father     Late onset  . Hypertension Sister   . Hypertension Brother     Prior to Admission medications   Medication Sig Start Date End Date Taking? Authorizing Provider  atorvastatin (LIPITOR) 40 MG tablet Take 40 mg by mouth daily.    Yes Historical Provider, MD  citalopram (CELEXA) 40 MG tablet Take 40 mg by mouth daily.   Yes Historical Provider, MD  clopidogrel (PLAVIX) 75 MG tablet Take 75 mg by mouth daily.   Yes Historical Provider, MD  famotidine (PEPCID) 20 MG tablet Take 1 tablet (20 mg total) by mouth 2 (two) times daily. 10/14/16  Yes Verlee Monte, MD  levothyroxine (SYNTHROID, LEVOTHROID) 100 MCG tablet Take 100 mcg by mouth daily before breakfast.   Yes Historical Provider, MD  predniSONE (DELTASONE) 20 MG tablet Take 2 tablets (40 mg total) by mouth daily with breakfast. 10/15/16  Yes Verlee Monte, MD    Physical Exam:  Constitutional: NAD, calm, comfortable Vitals:   10/23/16 2245 10/23/16 2300  10/23/16 2315 10/23/16 2330  BP: 151/83 150/88 156/80 153/85  Pulse: 73 72 69 68  Resp: 13 16 18 15   Temp:      TempSrc:      SpO2: 99% 99% 100% 100%  Weight:      Height:       Eyes: PERRL, lids and conjunctivae normal ENMT: Mucous membranes are moist. Posterior pharynx clear of any exudate or lesions. Neck: normal, supple, no masses, no thyromegaly Respiratory: clear to auscultation bilaterally, no wheezing, no crackles. Normal respiratory effort. No  accessory muscle use.  Cardiovascular: Regular rate and rhythm, no murmurs / rubs / gallops. No extremity edema. 2+ pedal pulses. No carotid bruits.  Abdomen: Soft, no tenderness, no masses palpated. No hepatosplenomegaly. Bowel sounds positive.  Musculoskeletal: no clubbing / cyanosis. Good ROM, no contractures. Normal muscle tone.  Skin: Scattered ecchymosis. Neurologic: CN 2-12 grossly intact. Sensation intact, DTR normal. Generalized weakness. Psychiatric: Normal judgment and insight. Alert and oriented x 3. Normal mood.    Labs on Admission: I have personally reviewed following labs and imaging studies  CBC:  Recent Labs Lab 10/23/16 2055  WBC 20.0*  NEUTROABS 18.9*  HGB 11.1*  HCT 34.1*  MCV 81.6  PLT 902   Basic Metabolic Panel:  Recent Labs Lab 10/23/16 2055  NA 138  K 4.1  CL 107  CO2 16*  GLUCOSE 231*  BUN 45*  CREATININE 3.51*  CALCIUM 9.1   GFR: Estimated Creatinine Clearance: 18.1 mL/min (by C-G formula based on SCr of 3.51 mg/dL (H)). Liver Function Tests:  Recent Labs Lab 10/23/16 2055  AST 20  ALT 21  ALKPHOS 49  BILITOT 0.9  PROT 7.1  ALBUMIN 3.8   No results for input(s): LIPASE, AMYLASE in the last 168 hours. No results for input(s): AMMONIA in the last 168 hours. Coagulation Profile:  Recent Labs Lab 10/23/16 2055  INR 1.07   Cardiac Enzymes: No results for input(s): CKTOTAL, CKMB, CKMBINDEX, TROPONINI in the last 168 hours. BNP (last 3 results) No results for input(s): PROBNP in the last 8760 hours. HbA1C: No results for input(s): HGBA1C in the last 72 hours. CBG: No results for input(s): GLUCAP in the last 168 hours. Lipid Profile: No results for input(s): CHOL, HDL, LDLCALC, TRIG, CHOLHDL, LDLDIRECT in the last 72 hours. Thyroid Function Tests: No results for input(s): TSH, T4TOTAL, FREET4, T3FREE, THYROIDAB in the last 72 hours. Anemia Panel: No results for input(s): VITAMINB12, FOLATE, FERRITIN, TIBC, IRON, RETICCTPCT  in the last 72 hours. Urine analysis:    Component Value Date/Time   COLORURINE YELLOW 10/23/2016 2049   APPEARANCEUR CLOUDY (A) 10/23/2016 2049   LABSPEC 1.014 10/23/2016 2049   PHURINE 5.0 10/23/2016 2049   GLUCOSEU >=500 (A) 10/23/2016 2049   HGBUR SMALL (A) 10/23/2016 2049   BILIRUBINUR NEGATIVE 10/23/2016 2049   [place] 10/23/2016 2049   PROTEINUR 30 (A) 10/23/2016 2049   NITRITE NEGATIVE 10/23/2016 2049   LEUKOCYTESUR MODERATE (A) 10/23/2016 2049    Radiological Exams on Admission: Dg Chest 2 View  Result Date: 10/23/2016 CLINICAL DATA:  Fever, chest pain, shortness of breath. EXAM: CHEST  2 VIEW COMPARISON:  Radiographs of September 25, 2016. FINDINGS: The heart size and mediastinal contours are within normal limits. Both lungs are clear. No pneumothorax or pleural effusion is noted. Eventration of the anterior portion of right hemidiaphragm is noted. Interval placement of right internal jugular catheter with distal tip at the expected position of cavoatrial junction. The visualized skeletal structures are  unremarkable. IMPRESSION: No active cardiopulmonary disease. Electronically Signed   By: Marijo Conception, M.D.   On: 10/23/2016 21:21    EKG: Independently reviewed. Vent. rate 87 BPM PR interval * ms QRS duration 88 ms QT/QTc 377/454 ms P-R-T axes 44 18 22 Sinus rhythm Low voltage, precordial leads RSR' in V1 or V2, right VCD or RVH  Assessment/Plan Principal Problem:   Bacteremia Admit to telemetry/inpatient. Suspected line sepsis. Continue on cefepime per pharmacy. Continue vancomycin per pharmacy. Check echocardiogram in the morning. Follow-up blood cultures and sensitivity. Consult ID in a.m. Nephrology is following as well.  Active Problems:   UTI (urinary tract infection) Continue cefepime. Follow-up urine culture and sensitivity. Follow-up blood cultures and sensitivity.    HTN (hypertension) Stable. Monitor blood pressure.     Hypothyroidism Continue levothyroxine 100 g by mouth daily. Monitor TSH periodically.    Chronic kidney disease (CKD), stage V (HCC) Creatinine is improving. Monitor BUN, creatinine and electrolytes. Nephrology will be following.    Hyperlipidemia Continue atorvastatin 40 mg by mouth daily.    History of CVA. Continue Plavix.    Depression Continue Celexa 40 mg by mouth daily.    DVT prophylaxis: Lovenox SQ. Code Status: Full code. Family Communication:  Disposition Plan: Admitfor IV antibiotic therapy, echocardiogram, ID consult in AM. Consults called: Nephrology [facility] Admission status: Inpatient/stepdown.   Reubin Milan MD [facility] Pager [phone].  If 7PM-7AM, please contact night-coverage [email or web address] Password [email or web address]  10/24/2016, 12:00 AM

## 2016-10-24 NOTE — Consult Note (Addendum)
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 Darrick Meigs for evaluation and recommendations surrounding AIN.  Briefly, pt was admitted 09/2016 for AKI of unknown cause.  She underwent renal biopsy showing AIN- only precipitating medication was PPI.  Was placed on prednisone. She required dialysis during that admission.  She hadn't required HD for several days prior to her discharge and creatinine had plateaued at 5.  Her HD cath was unfortunately not removed prior to discharge and instead there was a plan to check outpatient labs and then decide whether or not to remove the catheter.    At her outpatient labs on 2/12, her creatinine was 3.94.  WBC ct 20.4, last known WBC ct 5.5 on 1/30.  Was reporting chills.  Asked to go get blood cultures which she did.  Blood cultures 2/15 (drawn at [place]) grew GPCs in clusters, speciation pending.  In this setting she came in for treatment.  Repeat blood cultures obtained last night and she was started on broad spectrum antibiotics.  She is feeling better.    Past Medical History:  Diagnosis Date  . CHF (congestive heart failure) ([place])    Heart cath in 2006  . CKD (chronic kidney disease)    Baseline creat 1.4-1.7  . CVA (cerebral vascular accident) ([place])    No residual deficit  . HTN (hypertension)   . Hypothyroidism     Past Surgical History:  Procedure Laterality Date  . CARDIAC CATHETERIZATION  2006  . CHOLECYSTECTOMY    . IR GENERIC HISTORICAL  09/29/2016   IR FLUORO GUIDE CV LINE RIGHT 09/29/2016  Jacqulynn Cadet, MD [facility]  . IR GENERIC HISTORICAL  09/29/2016   IR US GUIDE VASC ACCESS RIGHT [facility]    Family History  Problem Relation Age of Onset  . Hypertension Mother   . Stroke Mother   . Coronary artery disease Mother     Late onset  . Hypertension Father   . Coronary artery disease Father     Late onset  . Hypertension Sister   . Hypertension Brother    Social History:  reports that she has never smoked. She has never used smokeless tobacco. She reports that she does not drink alcohol or use drugs.  Allergies:  Allergies  Allergen Reactions  . Penicillins Rash and Other (See Comments)    Tolerates rocephin  . Sulfa Antibiotics Rash    Medications Prior to Admission  Medication Sig Dispense Refill  . atorvastatin (LIPITOR) 40 MG tablet Take 40 mg by mouth daily.     . citalopram (CELEXA) 40 MG tablet Take 40 mg by mouth daily.    . clopidogrel (PLAVIX) 75 MG tablet Take 75 mg by  mouth daily.    . famotidine (PEPCID) 20 MG tablet Take 1 tablet (20 mg total) by mouth 2 (two) times daily. 60 tablet 0  . levothyroxine (SYNTHROID, LEVOTHROID) 100 MCG tablet Take 100 mcg by mouth daily before breakfast.    . predniSONE (DELTASONE) 20 MG tablet Take 2 tablets (40 mg total) by mouth daily with breakfast. 60 tablet 0    Results for orders placed or performed during the hospital encounter of 10/23/16 (from the past 48 hour(s))  Urinalysis, Routine w reflex microscopic     Status: Abnormal   Collection Time: 10/23/16  8:49 PM  Result Value Ref Range   Color, Urine YELLOW YELLOW   APPearance CLOUDY (A) CLEAR   Specific Gravity, Urine 1.014 1.005 - 1.030   pH 5.0 5.0 - 8.0   Glucose, UA >=500 (A) NEGATIVE mg/dL   Hgb urine dipstick SMALL (A) NEGATIVE   Bilirubin Urine NEGATIVE NEGATIVE   Ketones, ur NEGATIVE NEGATIVE mg/dL   Protein, ur 30 (A) NEGATIVE mg/dL   Nitrite NEGATIVE NEGATIVE   Leukocytes, UA MODERATE (A) NEGATIVE   RBC / HPF 0-5 0 - 5 RBC/hpf    WBC, UA 6-30 0 - 5 WBC/hpf   Bacteria, UA RARE (A) NONE SEEN   Squamous Epithelial / LPF 6-30 (A) NONE SEEN   Mucous PRESENT   Comprehensive metabolic panel     Status: Abnormal   Collection Time: 10/23/16  8:55 PM  Result Value Ref Range   Sodium 138 135 - 145 mmol/L   Potassium 4.1 3.5 - 5.1 mmol/L   Chloride 107 101 - 111 mmol/L   CO2 16 (L) 22 - 32 mmol/L   Glucose, Bld 231 (H) 65 - 99 mg/dL   BUN 45 (H) 6 - 20 mg/dL   Creatinine, Ser 3.51 (H) 0.44 - 1.00 mg/dL   Calcium 9.1 8.9 - 10.3 mg/dL   Total Protein 7.1 6.5 - 8.1 g/dL   Albumin 3.8 3.5 - 5.0 g/dL   AST 20 15 - 41 U/L   ALT 21 14 - 54 U/L   Alkaline Phosphatase 49 38 - 126 U/L   Total Bilirubin 0.9 0.3 - 1.2 mg/dL   GFR calc non Af Amer 13 (L) >60 mL/min   GFR calc Af Amer 15 (L) >60 mL/min    Comment: (NOTE) The eGFR has been calculated using the CKD EPI equation. This calculation has not been validated in all clinical situations. eGFR's persistently <60 mL/min signify possible Chronic Kidney Disease.    Anion gap 15 5 - 15  CBC with Differential     Status: Abnormal   Collection Time: 10/23/16  8:55 PM  Result Value Ref Range   WBC 20.0 (H) 4.0 - 10.5 K/uL   RBC 4.18 3.87 - 5.11 MIL/uL   Hemoglobin 11.1 (L) 12.0 - 15.0 g/dL   HCT 34.1 (L) 36.0 - 46.0 %   MCV 81.6 78.0 - 100.0 fL   MCH 26.6 26.0 - 34.0 pg   MCHC 32.6 30.0 - 36.0 g/dL   RDW 18.3 (H) 11.5 - 15.5 %   Platelets 214 150 - 400 K/uL   Neutrophils Relative % 95 %   Neutro Abs 18.9 (H) 1.7 - 7.7 K/uL   Lymphocytes Relative 4 %   Lymphs Abs 0.8 0.7 - 4.0 K/uL   Monocytes Relative 1 %   Monocytes Absolute 0.3 0.1 - 1.0 K/uL   Eosinophils Relative 0 %   Eosinophils Absolute 0.0 0.0 - 0.7 K/uL  Basophils Relative 0 %   Basophils Absolute 0.0 0.0 - 0.1 K/uL  Protime-INR     Status: None   Collection Time: 10/23/16  8:55 PM  Result Value Ref Range   Prothrombin Time 13.9 11.4 - 15.2 seconds   INR 1.07   I-Stat CG4 Lactic Acid, ED     Status:  Abnormal   Collection Time: 10/23/16  9:11 PM  Result Value Ref Range   Lactic Acid, Venous 3.02 (HH) 0.5 - 1.9 mmol/L   Comment NOTIFIED PHYSICIAN   Glucose, capillary     Status: Abnormal   Collection Time: 10/24/16  2:46 AM  Result Value Ref Range   Glucose-Capillary 120 (H) 65 - 99 mg/dL  Basic metabolic panel     Status: Abnormal   Collection Time: 10/24/16  3:44 AM  Result Value Ref Range   Sodium 139 135 - 145 mmol/L   Potassium 4.0 3.5 - 5.1 mmol/L   Chloride 117 (H) 101 - 111 mmol/L   CO2 12 (L) 22 - 32 mmol/L   Glucose, Bld 109 (H) 65 - 99 mg/dL   BUN 42 (H) 6 - 20 mg/dL   Creatinine, Ser 3.04 (H) 0.44 - 1.00 mg/dL   Calcium 7.4 (L) 8.9 - 10.3 mg/dL   GFR calc non Af Amer 15 (L) >60 mL/min   GFR calc Af Amer 18 (L) >60 mL/min    Comment: (NOTE) The eGFR has been calculated using the CKD EPI equation. This calculation has not been validated in all clinical situations. eGFR's persistently <60 mL/min signify possible Chronic Kidney Disease.    Anion gap 10 5 - 15  Lactic acid, plasma     Status: None   Collection Time: 10/24/16  3:44 AM  Result Value Ref Range   Lactic Acid, Venous 1.2 0.5 - 1.9 mmol/L  CBC with Differential/Platelet     Status: Abnormal   Collection Time: 10/24/16  5:50 AM  Result Value Ref Range   WBC 11.0 (H) 4.0 - 10.5 K/uL   RBC 3.22 (L) 3.87 - 5.11 MIL/uL   Hemoglobin 8.4 (L) 12.0 - 15.0 g/dL    Comment: DELTA CHECK NOTED REPEATED TO VERIFY    HCT 26.3 (L) 36.0 - 46.0 %   MCV 81.7 78.0 - 100.0 fL   MCH 26.4 26.0 - 34.0 pg   MCHC 32.3 30.0 - 36.0 g/dL   RDW 18.2 (H) 11.5 - 15.5 %   Platelets 125 (L) 150 - 400 K/uL   Neutrophils Relative % 83 %   Neutro Abs 9.2 (H) 1.7 - 7.7 K/uL   Lymphocytes Relative 11 %   Lymphs Abs 1.2 0.7 - 4.0 K/uL   Monocytes Relative 6 %   Monocytes Absolute 0.7 0.1 - 1.0 K/uL   Eosinophils Relative 0 %   Eosinophils Absolute 0.0 0.0 - 0.7 K/uL   Basophils Relative 0 %   Basophils Absolute 0.0 0.0 - 0.1  K/uL  Glucose, capillary     Status: None   Collection Time: 10/24/16  8:11 AM  Result Value Ref Range   Glucose-Capillary 83 65 - 99 mg/dL   Dg Chest 2 View  Result Date: 10/23/2016 CLINICAL DATA:  Fever, chest pain, shortness of breath. EXAM: CHEST  2 VIEW COMPARISON:  Radiographs of September 25, 2016. FINDINGS: The heart size and mediastinal contours are within normal limits. Both lungs are clear. No pneumothorax or pleural effusion is noted. Eventration of the anterior portion of right hemidiaphragm is noted. Interval placement of  right internal jugular catheter with distal tip at the expected position of cavoatrial junction. The visualized skeletal structures are unremarkable. IMPRESSION: No active cardiopulmonary disease. Electronically Signed   By: Marijo Conception, M.D.   On: 10/23/2016 21:21    ROS : all other systems reviewed and are negative except as per HPI  Blood pressure 140/73, pulse 62, temperature 97.9 F (36.6 C), temperature source Oral, resp. rate 12, height 5' 2"  (1.575 m), weight 100.5 kg (221 lb 9.6 oz), SpO2 99 %. Physical Exam  GEN: WDWN, lying in bed, NAD HEENT EOMI, PERRL, MMM NECK no JVD CHEST: nontunneled HD catheter in place, dressed, with some tenderness at venotomy site where biopatch meets skin.  No overt purulence PULM CTAB no c/w/r CV RRR soft systolic murmur LUSB ABD sot, nontender, nondistended, NABS EXT no LE edema NEURO: AAO x 3, nonfocal  Assessment/Plan  1.  Acute interstitial nephritis: Creatinine continues to improve.  She does not require any more dialysis.  Continue prednisone 40 mg daily.  PPIs listed as an allergy in Epic.  2.  Sepsis: +GPC bacteremia, WBC ct, Lactate of 3.0 on admission.  Repeat blood cultures here pending.  On vanc/ cefepime (2/15-) likely source is HD cath.  I have placed NPO orders and an order for this to be removed as well as a cath tip culture.  She will need a TTE and possibly a TEE to rule out endocarditis.    3.   HLD: atorvastatin  4 h/o CVA; on Plavix   Madelon Lips MD 10/24/2016, 9:12 AM  ADDENDUM: please note that I initially incorrectly documented pt's HD catheter as a TDC in this note.  It is actually a nontunneled HD catheter.  This is now corrected in the note.

## 2016-10-24 NOTE — Progress Notes (Signed)
  Echocardiogram 2D Echocardiogram has been performed.  Rachael [name] 10/24/2016, 2:27 PM

## 2016-10-24 NOTE — Progress Notes (Signed)
Subjective: Patient admitted this morning, see detailed H&P by Dr Olevia Bowens.  61 y.o. female with medical history significant of CHF, chronic kidney disease, CVA without residual deficit, hypertension, hypothyroidism, hyperlipidemia who was recently discharged for acute interstitial nephritis on 10/14/2017 (on prednisone 40 mg a day) who is coming to the emergency department referred by her nephrologist after the patient's recently drawn blood cultures grew gram-positive cocci in clusters. The patient stated that she has been experiencing fevers, fatigue, night sweats and chills at home.  Patient denies pain. Has been afebrile in the hospital.  Vitals:   10/24/16 0830 10/24/16 1440  BP: 140/73 (!) 123/59  Pulse: 62 74  Resp: 12 13  Temp: 97.9 F (36.6 C) 98.4 F (36.9 C)    On exam Chest is clear to auscultation bilaterally HD catheter noted in the right chest wall  A/P  Gram-positive cocci bacteremia UTI Hypertension Hypothyroidism CKD stage V Hyperlipidemia History of CVA  Repeat blood cultures were obtained in the ED, which have been negative to date. HD catheter removed today and tip sent for culture. 2-D echocardiogram has been obtained. Will follow the results. Patient might require TEE. I called and discussed with infectious disease Dr. Tommy Medal He recommends to get another set of blood cultures after removing the catheter. Change antibiotics to vancomycin and ceftriaxone. We'll discontinue cefepime ID to follow in a.m.     [facility] [phone]

## 2016-10-24 NOTE — Progress Notes (Signed)
Pharmacy Antibiotic Note Rachael [name] is a 62 y.o. female admitted on 10/23/2016 with reported gram positive cocci bacteremia from outpatient cultures. Noted history for AIN presumed secondary to PPI that required 4 intermittent hemodialysis during recent admission (none during outpatient). Planning removal of TDC cath and tip to be cultured.  Pharmacy has been consulted for Vancomycin and Cefepime dosing. Also covering for UTI.     Vancomycin 2000 mg IV loading dose given at 10pm on 10/23/16.  Creatinine trended down to 3.04.  No dialysis planned. Afebrile, WBC down to 11.0. On Prednisone 40 mg daily.  Plan:  Continue Vancomycin with 1 gram IV q48hrs - begin on 2/17 at 8pm.  Continue Cefepime 1 gram IV every 24 hours at 10pm.  Will follow renal function, culture data, and antibioitc plans.  Follow up ID input.  Height: 5\' 2"  (157.5 cm) Weight: 221 lb 9.6 oz (100.5 kg) IBW/kg (Calculated) : 50.1  Temp (24hrs), Avg:98 F (36.7 C), Min:97.9 F (36.6 C), Max:98.2 F (36.8 C)   Recent Labs Lab 10/23/16 2055 10/23/16 2111 10/24/16 0344 10/24/16 0550  WBC 20.0*  --   --  11.0*  CREATININE 3.51*  --  3.04*  --   LATICACIDVEN  --  3.02* 1.2  --     Estimated Creatinine Clearance: 21.3 mL/min (by C-G formula based on SCr of 3.04 mg/dL (H)).    Allergies  Allergen Reactions  . Proton Pump Inhibitors     Biopsy proven acute interstitial nephritis; required dialysis in 09/2016  . Penicillins Rash and Other (See Comments)    Tolerates rocephin  . Sulfa Antibiotics Rash    Antimicrobials this admission:  Cefepime 2/15  >>   Vancomycin  2/15 >>   Dose adjustments this admission: n/a  Microbiology results: 2/12 Blood: GPC in clusters (from outpt [facility]) 2/15 Blood x 2 - now growth < 12 hrs so far 2/16 cath tip -    Arty Baumgartner, [place] Pager: [phone] 10/24/2016, 12:13 PM

## 2016-10-25 DIAGNOSIS — E785 Hyperlipidemia, unspecified: Secondary | ICD-10-CM

## 2016-10-25 DIAGNOSIS — Z880 Allergy status to penicillin: Secondary | ICD-10-CM

## 2016-10-25 DIAGNOSIS — Z882 Allergy status to sulfonamides status: Secondary | ICD-10-CM

## 2016-10-25 DIAGNOSIS — T827XXA Infection and inflammatory reaction due to other cardiac and vascular devices, implants and grafts, initial encounter: Secondary | ICD-10-CM

## 2016-10-25 DIAGNOSIS — Z8249 Family history of ischemic heart disease and other diseases of the circulatory system: Secondary | ICD-10-CM

## 2016-10-25 DIAGNOSIS — Z888 Allergy status to other drugs, medicaments and biological substances status: Secondary | ICD-10-CM

## 2016-10-25 DIAGNOSIS — Z823 Family history of stroke: Secondary | ICD-10-CM

## 2016-10-25 DIAGNOSIS — I509 Heart failure, unspecified: Secondary | ICD-10-CM

## 2016-10-25 DIAGNOSIS — I132 Hypertensive heart and chronic kidney disease with heart failure and with stage 5 chronic kidney disease, or end stage renal disease: Secondary | ICD-10-CM

## 2016-10-25 LAB — GLUCOSE, CAPILLARY
GLUCOSE-CAPILLARY: 140 mg/dL — AB (ref 65–99)
GLUCOSE-CAPILLARY: 140 mg/dL — AB (ref 65–99)
Glucose-Capillary: 85 mg/dL (ref 65–99)
Glucose-Capillary: 98 mg/dL (ref 65–99)

## 2016-10-25 LAB — BASIC METABOLIC PANEL
Anion gap: 6 (ref 5–15)
BUN: 31 mg/dL — AB (ref 6–20)
CALCIUM: 8.2 mg/dL — AB (ref 8.9–10.3)
CO2: 18 mmol/L — AB (ref 22–32)
CREATININE: 2.99 mg/dL — AB (ref 0.44–1.00)
Chloride: 117 mmol/L — ABNORMAL HIGH (ref 101–111)
GFR calc Af Amer: 18 mL/min — ABNORMAL LOW (ref >60)
GFR calc non Af Amer: 16 mL/min — ABNORMAL LOW (ref >60)
Glucose, Bld: 93 mg/dL (ref 65–99)
Potassium: 3.7 mmol/L (ref 3.5–5.1)
SODIUM: 141 mmol/L (ref 135–145)

## 2016-10-25 LAB — CBC WITH DIFFERENTIAL/PLATELET
BASOS PCT: 0 %
Basophils Absolute: 0 10*3/uL (ref 0.0–0.1)
EOS ABS: 0 10*3/uL (ref 0.0–0.7)
EOS PCT: 1 %
HCT: 26.7 % — ABNORMAL LOW (ref 36.0–46.0)
Hemoglobin: 8.6 g/dL — ABNORMAL LOW (ref 12.0–15.0)
Lymphocytes Relative: 14 %
Lymphs Abs: 1.1 10*3/uL (ref 0.7–4.0)
MCH: 26.5 pg (ref 26.0–34.0)
MCHC: 32.2 g/dL (ref 30.0–36.0)
MCV: 82.2 fL (ref 78.0–100.0)
MONOS PCT: 5 %
Monocytes Absolute: 0.4 10*3/uL (ref 0.1–1.0)
Neutro Abs: 6.3 10*3/uL (ref 1.7–7.7)
Neutrophils Relative %: 81 %
PLATELETS: 115 10*3/uL — AB (ref 150–400)
RBC: 3.25 MIL/uL — ABNORMAL LOW (ref 3.87–5.11)
RDW: 18.4 % — AB (ref 11.5–15.5)
WBC: 7.9 10*3/uL (ref 4.0–10.5)

## 2016-10-25 NOTE — Progress Notes (Signed)
PHARMACY - PHYSICIAN COMMUNICATION CRITICAL VALUE ALERT - BLOOD CULTURE IDENTIFICATION (BCID)  Results for orders placed or performed during the hospital encounter of 10/23/16  Blood Culture ID Panel (Reflexed) (Collected: 10/23/2016  9:20 PM)  Result Value Ref Range   Enterococcus species NOT DETECTED NOT DETECTED   Listeria monocytogenes NOT DETECTED NOT DETECTED   Staphylococcus species DETECTED (A) NOT DETECTED   Staphylococcus aureus NOT DETECTED NOT DETECTED   Methicillin resistance DETECTED (A) NOT DETECTED   Streptococcus species NOT DETECTED NOT DETECTED   Streptococcus agalactiae NOT DETECTED NOT DETECTED   Streptococcus pneumoniae NOT DETECTED NOT DETECTED   Streptococcus pyogenes NOT DETECTED NOT DETECTED   Acinetobacter baumannii NOT DETECTED NOT DETECTED   Enterobacteriaceae species NOT DETECTED NOT DETECTED   Enterobacter cloacae complex NOT DETECTED NOT DETECTED   Escherichia coli NOT DETECTED NOT DETECTED   Klebsiella oxytoca NOT DETECTED NOT DETECTED   Klebsiella pneumoniae NOT DETECTED NOT DETECTED   Proteus species NOT DETECTED NOT DETECTED   Serratia marcescens NOT DETECTED NOT DETECTED   Haemophilus influenzae NOT DETECTED NOT DETECTED   Neisseria meningitidis NOT DETECTED NOT DETECTED   Pseudomonas aeruginosa NOT DETECTED NOT DETECTED   Candida albicans NOT DETECTED NOT DETECTED   Candida glabrata NOT DETECTED NOT DETECTED   Candida krusei NOT DETECTED NOT DETECTED   Candida parapsilosis NOT DETECTED NOT DETECTED   Candida tropicalis NOT DETECTED NOT DETECTED    Name of physician (or Provider) Contacted:   NA  Changes to prescribed antibiotics required:   1/2 Blood cultures from 2/15 growing MR CoNS.  Currently receiving Vancomycin and Rocephin.  F/U ID recommendations    Caryl Pina 10/25/2016  12:48 AM

## 2016-10-25 NOTE — Progress Notes (Signed)
[facility] Progress Note    Assessment/ Plan:   1.  Acute interstitial nephritis: Creatinine continues to improve.  She does not require any more dialysis.  Continue prednisone 40 mg daily.  PPIs listed as an allergy in Epic.  2.  Sepsis: +GPC bacteremia, WBC ct, Lactate of 3.0 on admission. Cultures at [place] 2/13 + GPCs in clusters, cultures here 2/15 + CONS with methicillin resistance.  Repeat cultures 2/16 pending.  On vanc/ cefepime (2/15-).  HD catheter removed 2/15.  TTE without overt vegatations.  ID now following, appreciate assistance.  May need TEE.  Will call [facility] tomorrow to see if there are any updates to the culture.   3.  HLD: atorvastatin  4 h/o CVA; on Plavix  Subjective:    Feeling much better.  Blood cultures from [facility] (collected 10/21/16) still GPCs in clusters, no speciation yet.  Blood cultures here growing CONS (methicillin resistant)   Objective:   BP (!) 171/77 (BP Location: Right Arm)   Pulse 82   Temp 98.1 F (36.7 C) (Oral)   Resp 16   Ht 5\' 2"  (1.575 m)   Wt 100.5 kg (221 lb 9.6 oz)   SpO2 100%   BMI 40.53 kg/m   Intake/Output Summary (Last 24 hours) at 10/25/16 1022 Last data filed at 10/25/16 0601  Gross per 24 hour  Intake              442 ml  Output             2550 ml  Net            -2108 ml   Weight change: 2.994 kg (6 lb 9.6 oz)  Physical Exam: GEN: WDWN, lying in bed, NAD, appears to be feeling much better than yesterday HEENT EOMI, PERRL, MMM NECK no JVD CHEST: nontunneled catheter removed, dressing c/d/i PULM CTAB no c/w/r CV RRR soft systolic murmur LUSB ABD sot, nontender, nondistended, NABS EXT no LE edema NEURO: AAO x 3, nonfocal  Imaging: Dg Chest 2 View  Result Date: 10/23/2016 CLINICAL DATA:  Fever, chest pain, shortness of breath. EXAM: CHEST  2 VIEW COMPARISON:  Radiographs of September 25, 2016. FINDINGS: The heart size and mediastinal contours are within normal limits. Both lungs are  clear. No pneumothorax or pleural effusion is noted. Eventration of the anterior portion of right hemidiaphragm is noted. Interval placement of right internal jugular catheter with distal tip at the expected position of cavoatrial junction. The visualized skeletal structures are unremarkable. IMPRESSION: No active cardiopulmonary disease. Electronically Signed   By: Marijo Conception, M.D.   On: 10/23/2016 21:21    Labs: BMET  Recent Labs Lab 10/23/16 2055 10/24/16 0344 10/25/16 0651  NA 138 139 141  K 4.1 4.0 3.7  CL 107 117* 117*  CO2 16* 12* 18*  GLUCOSE 231* 109* 93  BUN 45* 42* 31*  CREATININE 3.51* 3.04* 2.99*  CALCIUM 9.1 7.4* 8.2*   CBC  Recent Labs Lab 10/23/16 2055 10/24/16 0550 10/25/16 0651  WBC 20.0* 11.0* 7.9  NEUTROABS 18.9* 9.2* 6.3  HGB 11.1* 8.4* 8.6*  HCT 34.1* 26.3* 26.7*  MCV 81.6 81.7 82.2  PLT 214 125* 115*    Medications:    . atorvastatin  40 mg Oral Daily  . cefTRIAXone (ROCEPHIN)  IV  2 g Intravenous Q24H  . citalopram  40 mg Oral Daily  . clopidogrel  75 mg Oral Daily  . enoxaparin (LOVENOX) injection  30 mg Subcutaneous  Q24H  . famotidine  20 mg Oral Daily  . insulin aspart  0-9 Units Subcutaneous TID WC  . levothyroxine  100 mcg Oral QAC breakfast  . predniSONE  40 mg Oral Q breakfast  . vancomycin  1,000 mg Intravenous Q48H      Rachael Lips, MD [place] pgr [phone] 10/25/2016, 10:22 AM

## 2016-10-25 NOTE — Consult Note (Signed)
Date of Admission:  10/23/2016  Date of Consult:  10/25/2016  Reason for Consult: Gram-positive bacteremia with HD catheter in place Referring Physician: Dr Denton Brick    HPI: Rachael [name] is an 61 y.o. female medical history significant of CHF, chronic kidney disease, CVA without residual deficit, hypertension, hypothyroidism, hyperlipidemia who was recently discharged for acute interstitial nephritis on 10/14/2017 (on prednisone 40 mg a day  had HD catheter placed. She had been expressing fevers fatigue night sweats and chills at home. She underwent blood work which showed elevated white blood cell count per she was asked to come to [facility] were blood cultures were performed and essentially turned positive for gram-positive cocci in clusters in 2 out of 2 cultures. She has been admitted to the hospitalist service overnight and placed on vancomycin and cefepime with the latter being changed to ceftriaxone. Admission blood cultures here are growing a coag negative staph in one of 2 blood cultures. I reviewed the case with nephrology and the cultures on at lab core that were taken [facility] are still only being shown as gram-positive cocci in clusters without a formal identification or sensitivities.  Her dialysis catheter has been removed and repeat blood cultures have been obtained.   He has had a transthoracic echocardiogram which did not show evidence of vegetations.    Past Medical History:  Diagnosis Date  . CHF (congestive heart failure) (Rachael [name])    Heart cath in 2006  . CKD (chronic kidney disease)    Baseline creat 1.4-1.7  . CVA (cerebral vascular accident) (Rachael [name])    No residual deficit  . HTN (hypertension)   . Hypothyroidism     Past Surgical History:  Procedure Laterality Date  . CARDIAC CATHETERIZATION  2006  . CHOLECYSTECTOMY    . IR GENERIC HISTORICAL  09/29/2016   IR FLUORO GUIDE CV LINE RIGHT 09/29/2016 Jacqulynn Cadet, MD [facility]  . IR  GENERIC HISTORICAL  09/29/2016   IR US GUIDE VASC ACCESS RIGHT [facility]    Social History:  reports that she has never smoked. She has never used smokeless tobacco. She reports that she does not drink alcohol or use drugs.   Family History  Problem Relation Age of Onset  . Hypertension Mother   . Stroke Mother   . Coronary artery disease Mother     Late onset  . Hypertension Father   . Coronary artery disease Father     Late onset  . Hypertension Sister   . Hypertension Brother     Allergies  Allergen Reactions  . Proton Pump Inhibitors     Biopsy proven acute interstitial nephritis; required dialysis in 09/2016  . Penicillins Rash and Other (See Comments)    Tolerates rocephin  . Sulfa Antibiotics Rash     Medications: I have reviewed patients current medications as documented in Epic Anti-infectives    Start     Dose/Rate Route Frequency Ordered Stop   10/25/16 2000  vancomycin (VANCOCIN) IVPB 1000 mg/200 mL premix     1,000 mg 200 mL/hr over 60 Minutes Intravenous Every 48 hours 10/24/16 1210     10/24/16 2200  ceFEPIme (MAXIPIME) 1 g in dextrose 5 % 50 mL IVPB  Status:  Discontinued     1 g 100 mL/hr over 30 Minutes Intravenous Every 24 hours 10/23/16 2149 10/24/16 1618   10/24/16 1800  cefTRIAXone (ROCEPHIN) 2 g in dextrose 5 % 50 mL IVPB  2 g 100 mL/hr over 30 Minutes Intravenous Every 24 hours 10/24/16 1626     10/23/16 2145  levofloxacin (LEVAQUIN) IVPB 750 mg  Status:  Discontinued     750 mg 100 mL/hr over 90 Minutes Intravenous  Once 10/23/16 2137 10/23/16 2141   10/23/16 2145  aztreonam (AZACTAM) 2 g in dextrose 5 % 50 mL IVPB  Status:  Discontinued     2 g 100 mL/hr over 30 Minutes Intravenous  Once 10/23/16 2137 10/23/16 2141   10/23/16 2145  vancomycin (VANCOCIN) IVPB 1000 mg/200 mL premix  Status:  Discontinued     1,000 mg 200 mL/hr over 60 Minutes Intravenous  Once 10/23/16 2137 10/23/16 2141   10/23/16 2145  vancomycin (VANCOCIN) 2,000 mg in  sodium chloride 0.9 % 500 mL IVPB     2,000 mg 250 mL/hr over 120 Minutes Intravenous  Once 10/23/16 2141 10/24/16 0002   10/23/16 2145  ceFEPIme (MAXIPIME) 2 g in dextrose 5 % 50 mL IVPB     2 g 100 mL/hr over 30 Minutes Intravenous  Once 10/23/16 2141 10/23/16 2217         ROS:as in HPI otherwise remainder of 12 point Review of Systems is negative   Blood pressure (!) 171/77, pulse 82, temperature 98.1 F (36.7 C), temperature source Oral, resp. rate 16, height 5' 2"  (1.575 m), weight 221 lb 9.6 oz (100.5 kg), SpO2 100 %. General: Alert and awake, oriented x3, not in any acute distress. HEENT: anicteric sclera,  EOMI, oropharynx clear and without exudate Cardiovascular: regular rate, normal r,  no murmur rubs or gallops Pulmonary: clear to auscultation bilaterally, no wheezing, rales or rhonchi Gastrointestinal: soft nontender, nondistended, normal bowel sounds, Musculoskeletal: no  clubbing or edema noted bilaterally Skin, bandage in place were HD catheter was  Neuro: nonfocal, strength and sensation intact   Results for orders placed or performed during the hospital encounter of 10/23/16 (from the past 48 hour(s))  Urinalysis, Routine w reflex microscopic     Status: Abnormal   Collection Time: 10/23/16  8:49 PM  Result Value Ref Range   Color, Urine YELLOW YELLOW   APPearance CLOUDY (A) CLEAR   Specific Gravity, Urine 1.014 1.005 - 1.030   pH 5.0 5.0 - 8.0   Glucose, UA >=500 (A) NEGATIVE mg/dL   Hgb urine dipstick SMALL (A) NEGATIVE   Bilirubin Urine NEGATIVE NEGATIVE   Ketones, ur NEGATIVE NEGATIVE mg/dL   Protein, ur 30 (A) NEGATIVE mg/dL   Nitrite NEGATIVE NEGATIVE   Leukocytes, UA MODERATE (A) NEGATIVE   RBC / HPF 0-5 0 - 5 RBC/hpf   WBC, UA 6-30 0 - 5 WBC/hpf   Bacteria, UA RARE (A) NONE SEEN   Squamous Epithelial / LPF 6-30 (A) NONE SEEN   Mucous PRESENT   Comprehensive metabolic panel     Status: Abnormal   Collection Time: 10/23/16  8:55 PM  Result Value  Ref Range   Sodium 138 135 - 145 mmol/L   Potassium 4.1 3.5 - 5.1 mmol/L   Chloride 107 101 - 111 mmol/L   CO2 16 (L) 22 - 32 mmol/L   Glucose, Bld 231 (H) 65 - 99 mg/dL   BUN 45 (H) 6 - 20 mg/dL   Creatinine, Ser 3.51 (H) 0.44 - 1.00 mg/dL   Calcium 9.1 8.9 - 10.3 mg/dL   Total Protein 7.1 6.5 - 8.1 g/dL   Albumin 3.8 3.5 - 5.0 g/dL   AST 20 15 - 41 U/L  ALT 21 14 - 54 U/L   Alkaline Phosphatase 49 38 - 126 U/L   Total Bilirubin 0.9 0.3 - 1.2 mg/dL   GFR calc non Af Amer 13 (L) >60 mL/min   GFR calc Af Amer 15 (L) >60 mL/min    Comment: (NOTE) The eGFR has been calculated using the CKD EPI equation. This calculation has not been validated in all clinical situations. eGFR's persistently <60 mL/min signify possible Chronic Kidney Disease.    Anion gap 15 5 - 15  CBC with Differential     Status: Abnormal   Collection Time: 10/23/16  8:55 PM  Result Value Ref Range   WBC 20.0 (H) 4.0 - 10.5 K/uL   RBC 4.18 3.87 - 5.11 MIL/uL   Hemoglobin 11.1 (L) 12.0 - 15.0 g/dL   HCT 34.1 (L) 36.0 - 46.0 %   MCV 81.6 78.0 - 100.0 fL   MCH 26.6 26.0 - 34.0 pg   MCHC 32.6 30.0 - 36.0 g/dL   RDW 18.3 (H) 11.5 - 15.5 %   Platelets 214 150 - 400 K/uL   Neutrophils Relative % 95 %   Neutro Abs 18.9 (H) 1.7 - 7.7 K/uL   Lymphocytes Relative 4 %   Lymphs Abs 0.8 0.7 - 4.0 K/uL   Monocytes Relative 1 %   Monocytes Absolute 0.3 0.1 - 1.0 K/uL   Eosinophils Relative 0 %   Eosinophils Absolute 0.0 0.0 - 0.7 K/uL   Basophils Relative 0 %   Basophils Absolute 0.0 0.0 - 0.1 K/uL  Protime-INR     Status: None   Collection Time: 10/23/16  8:55 PM  Result Value Ref Range   Prothrombin Time 13.9 11.4 - 15.2 seconds   INR 1.07   Culture, blood (Routine x 2)     Status: None (Preliminary result)   Collection Time: 10/23/16  9:00 PM  Result Value Ref Range   Specimen Description BLOOD LEFT ANTECUBITAL    Special Requests BOTTLES DRAWN AEROBIC AND ANAEROBIC 5CC    Culture  Setup Time      GRAM  POSITIVE COCCI IN CLUSTERS IN BOTH AEROBIC AND ANAEROBIC BOTTLES CRITICAL VALUE NOTED.  VALUE IS CONSISTENT WITH PREVIOUSLY REPORTED AND CALLED VALUE.    Culture TOO YOUNG TO READ    Report Status PENDING   I-Stat CG4 Lactic Acid, ED     Status: Abnormal   Collection Time: 10/23/16  9:11 PM  Result Value Ref Range   Lactic Acid, Venous 3.02 (HH) 0.5 - 1.9 mmol/L   Comment NOTIFIED PHYSICIAN   Culture, blood (Routine x 2)     Status: None (Preliminary result)   Collection Time: 10/23/16  9:20 PM  Result Value Ref Range   Specimen Description BLOOD RIGHT ANTECUBITAL    Special Requests BOTTLES DRAWN AEROBIC AND ANAEROBIC 5CC    Culture  Setup Time      GRAM POSITIVE COCCI IN CLUSTERS IN BOTH AEROBIC AND ANAEROBIC BOTTLES CRITICAL RESULT CALLED TO, READ BACK BY AND VERIFIED WITH: G ABBOTT PHARMD [id number] 10/24/16 A BROWNING    Culture      GRAM POSITIVE COCCI CULTURE REINCUBATED FOR BETTER GROWTH    Report Status PENDING   Blood Culture ID Panel (Reflexed)     Status: Abnormal   Collection Time: 10/23/16  9:20 PM  Result Value Ref Range   Enterococcus species NOT DETECTED NOT DETECTED   Listeria monocytogenes NOT DETECTED NOT DETECTED   Staphylococcus species DETECTED (A) NOT DETECTED    Comment:  Methicillin (oxacillin) resistant coagulase negative staphylococcus. Possible blood culture contaminant (unless isolated from more than one blood culture draw or clinical case suggests pathogenicity). No antibiotic treatment is indicated for blood  culture contaminants. CRITICAL RESULT CALLED TO, READ BACK BY AND VERIFIED WITH: G ABBOTT PHARMD [id number] 10/24/16 A BROWNING    Staphylococcus aureus NOT DETECTED NOT DETECTED   Methicillin resistance DETECTED (A) NOT DETECTED    Comment: CRITICAL RESULT CALLED TO, READ BACK BY AND VERIFIED WITH: G ABBOTT PHARMD 2331 10/24/16 A BROWNING    Streptococcus species NOT DETECTED NOT DETECTED   Streptococcus agalactiae NOT DETECTED NOT DETECTED    Streptococcus pneumoniae NOT DETECTED NOT DETECTED   Streptococcus pyogenes NOT DETECTED NOT DETECTED   Acinetobacter baumannii NOT DETECTED NOT DETECTED   Enterobacteriaceae species NOT DETECTED NOT DETECTED   Enterobacter cloacae complex NOT DETECTED NOT DETECTED   Escherichia coli NOT DETECTED NOT DETECTED   Klebsiella oxytoca NOT DETECTED NOT DETECTED   Klebsiella pneumoniae NOT DETECTED NOT DETECTED   Proteus species NOT DETECTED NOT DETECTED   Serratia marcescens NOT DETECTED NOT DETECTED   Haemophilus influenzae NOT DETECTED NOT DETECTED   Neisseria meningitidis NOT DETECTED NOT DETECTED   Pseudomonas aeruginosa NOT DETECTED NOT DETECTED   Candida albicans NOT DETECTED NOT DETECTED   Candida glabrata NOT DETECTED NOT DETECTED   Candida krusei NOT DETECTED NOT DETECTED   Candida parapsilosis NOT DETECTED NOT DETECTED   Candida tropicalis NOT DETECTED NOT DETECTED  Glucose, capillary     Status: Abnormal   Collection Time: 10/24/16  2:46 AM  Result Value Ref Range   Glucose-Capillary 120 (H) 65 - 99 mg/dL  Basic metabolic panel     Status: Abnormal   Collection Time: 10/24/16  3:44 AM  Result Value Ref Range   Sodium 139 135 - 145 mmol/L   Potassium 4.0 3.5 - 5.1 mmol/L   Chloride 117 (H) 101 - 111 mmol/L   CO2 12 (L) 22 - 32 mmol/L   Glucose, Bld 109 (H) 65 - 99 mg/dL   BUN 42 (H) 6 - 20 mg/dL   Creatinine, Ser 3.04 (H) 0.44 - 1.00 mg/dL   Calcium 7.4 (L) 8.9 - 10.3 mg/dL   GFR calc non Af Amer 15 (L) >60 mL/min   GFR calc Af Amer 18 (L) >60 mL/min    Comment: (NOTE) The eGFR has been calculated using the CKD EPI equation. This calculation has not been validated in all clinical situations. eGFR's persistently <60 mL/min signify possible Chronic Kidney Disease.    Anion gap 10 5 - 15  Lactic acid, plasma     Status: None   Collection Time: 10/24/16  3:44 AM  Result Value Ref Range   Lactic Acid, Venous 1.2 0.5 - 1.9 mmol/L  CBC with Differential/Platelet     Status:  Abnormal   Collection Time: 10/24/16  5:50 AM  Result Value Ref Range   WBC 11.0 (H) 4.0 - 10.5 K/uL   RBC 3.22 (L) 3.87 - 5.11 MIL/uL   Hemoglobin 8.4 (L) 12.0 - 15.0 g/dL    Comment: DELTA CHECK NOTED REPEATED TO VERIFY    HCT 26.3 (L) 36.0 - 46.0 %   MCV 81.7 78.0 - 100.0 fL   MCH 26.4 26.0 - 34.0 pg   MCHC 32.3 30.0 - 36.0 g/dL   RDW 18.2 (H) 11.5 - 15.5 %   Platelets 125 (L) 150 - 400 K/uL   Neutrophils Relative % 83 %   Neutro Abs 9.2 (  H) 1.7 - 7.7 K/uL   Lymphocytes Relative 11 %   Lymphs Abs 1.2 0.7 - 4.0 K/uL   Monocytes Relative 6 %   Monocytes Absolute 0.7 0.1 - 1.0 K/uL   Eosinophils Relative 0 %   Eosinophils Absolute 0.0 0.0 - 0.7 K/uL   Basophils Relative 0 %   Basophils Absolute 0.0 0.0 - 0.1 K/uL  Glucose, capillary     Status: None   Collection Time: 10/24/16  8:11 AM  Result Value Ref Range   Glucose-Capillary 83 65 - 99 mg/dL  Glucose, capillary     Status: None   Collection Time: 10/24/16 12:03 PM  Result Value Ref Range   Glucose-Capillary 78 65 - 99 mg/dL  Glucose, capillary     Status: Abnormal   Collection Time: 10/24/16  5:37 PM  Result Value Ref Range   Glucose-Capillary 125 (H) 65 - 99 mg/dL  Culture, blood (Routine X 2) w Reflex to ID Panel     Status: None (Preliminary result)   Collection Time: 10/24/16  6:13 PM  Result Value Ref Range   Specimen Description BLOOD RIGHT ANTECUBITAL    Special Requests BOTTLES DRAWN AEROBIC AND ANAEROBIC 5CC EACH    Culture NO GROWTH < 24 HOURS    Report Status PENDING   Culture, blood (Routine X 2) w Reflex to ID Panel     Status: None (Preliminary result)   Collection Time: 10/24/16  6:13 PM  Result Value Ref Range   Specimen Description BLOOD LEFT ANTECUBITAL    Special Requests BOTTLES DRAWN AEROBIC ONLY 8CC    Culture NO GROWTH < 24 HOURS    Report Status PENDING   Glucose, capillary     Status: Abnormal   Collection Time: 10/24/16  8:58 PM  Result Value Ref Range   Glucose-Capillary 160 (H) 65  - 99 mg/dL  Basic metabolic panel     Status: Abnormal   Collection Time: 10/25/16  6:51 AM  Result Value Ref Range   Sodium 141 135 - 145 mmol/L   Potassium 3.7 3.5 - 5.1 mmol/L   Chloride 117 (H) 101 - 111 mmol/L   CO2 18 (L) 22 - 32 mmol/L   Glucose, Bld 93 65 - 99 mg/dL   BUN 31 (H) 6 - 20 mg/dL   Creatinine, Ser 2.99 (H) 0.44 - 1.00 mg/dL   Calcium 8.2 (L) 8.9 - 10.3 mg/dL   GFR calc non Af Amer 16 (L) >60 mL/min   GFR calc Af Amer 18 (L) >60 mL/min    Comment: (NOTE) The eGFR has been calculated using the CKD EPI equation. This calculation has not been validated in all clinical situations. eGFR's persistently <60 mL/min signify possible Chronic Kidney Disease.    Anion gap 6 5 - 15  CBC WITH DIFFERENTIAL     Status: Abnormal   Collection Time: 10/25/16  6:51 AM  Result Value Ref Range   WBC 7.9 4.0 - 10.5 K/uL   RBC 3.25 (L) 3.87 - 5.11 MIL/uL   Hemoglobin 8.6 (L) 12.0 - 15.0 g/dL   HCT 26.7 (L) 36.0 - 46.0 %   MCV 82.2 78.0 - 100.0 fL   MCH 26.5 26.0 - 34.0 pg   MCHC 32.2 30.0 - 36.0 g/dL   RDW 18.4 (H) 11.5 - 15.5 %   Platelets 115 (L) 150 - 400 K/uL    Comment: SPECIMEN CHECKED FOR CLOTS REPEATED TO VERIFY PLATELET COUNT CONFIRMED BY SMEAR    Neutrophils Relative % 81 %  Neutro Abs 6.3 1.7 - 7.7 K/uL   Lymphocytes Relative 14 %   Lymphs Abs 1.1 0.7 - 4.0 K/uL   Monocytes Relative 5 %   Monocytes Absolute 0.4 0.1 - 1.0 K/uL   Eosinophils Relative 1 %   Eosinophils Absolute 0.0 0.0 - 0.7 K/uL   Basophils Relative 0 %   Basophils Absolute 0.0 0.0 - 0.1 K/uL  Glucose, capillary     Status: None   Collection Time: 10/25/16  7:35 AM  Result Value Ref Range   Glucose-Capillary 85 65 - 99 mg/dL  Glucose, capillary     Status: None   Collection Time: 10/25/16 11:26 AM  Result Value Ref Range   Glucose-Capillary 98 65 - 99 mg/dL   @BRIEFLABTABLE (sdes,specrequest,cult,reptstatus)   ) Recent Results (from the past 720 hour(s))  Culture, blood (Routine x 2)      Status: None (Preliminary result)   Collection Time: 10/23/16  9:00 PM  Result Value Ref Range Status   Specimen Description BLOOD LEFT ANTECUBITAL  Final   Special Requests BOTTLES DRAWN AEROBIC AND ANAEROBIC 5CC  Final   Culture  Setup Time   Final    GRAM POSITIVE COCCI IN CLUSTERS IN BOTH AEROBIC AND ANAEROBIC BOTTLES CRITICAL VALUE NOTED.  VALUE IS CONSISTENT WITH PREVIOUSLY REPORTED AND CALLED VALUE.    Culture TOO YOUNG TO READ  Final   Report Status PENDING  Incomplete  Culture, blood (Routine x 2)     Status: None (Preliminary result)   Collection Time: 10/23/16  9:20 PM  Result Value Ref Range Status   Specimen Description BLOOD RIGHT ANTECUBITAL  Final   Special Requests BOTTLES DRAWN AEROBIC AND ANAEROBIC 5CC  Final   Culture  Setup Time   Final    GRAM POSITIVE COCCI IN CLUSTERS IN BOTH AEROBIC AND ANAEROBIC BOTTLES CRITICAL RESULT CALLED TO, READ BACK BY AND VERIFIED WITH: G ABBOTT PHARMD 2331 10/24/16 A BROWNING    Culture   Final    GRAM POSITIVE COCCI CULTURE REINCUBATED FOR BETTER GROWTH    Report Status PENDING  Incomplete  Blood Culture ID Panel (Reflexed)     Status: Abnormal   Collection Time: 10/23/16  9:20 PM  Result Value Ref Range Status   Enterococcus species NOT DETECTED NOT DETECTED Final   Listeria monocytogenes NOT DETECTED NOT DETECTED Final   Staphylococcus species DETECTED (A) NOT DETECTED Final    Comment: Methicillin (oxacillin) resistant coagulase negative staphylococcus. Possible blood culture contaminant (unless isolated from more than one blood culture draw or clinical case suggests pathogenicity). No antibiotic treatment is indicated for blood  culture contaminants. CRITICAL RESULT CALLED TO, READ BACK BY AND VERIFIED WITH: G ABBOTT PHARMD 2331 10/24/16 A BROWNING    Staphylococcus aureus NOT DETECTED NOT DETECTED Final   Methicillin resistance DETECTED (A) NOT DETECTED Final    Comment: CRITICAL RESULT CALLED TO, READ BACK BY AND  VERIFIED WITH: G ABBOTT PHARMD 2331 10/24/16 A BROWNING    Streptococcus species NOT DETECTED NOT DETECTED Final   Streptococcus agalactiae NOT DETECTED NOT DETECTED Final   Streptococcus pneumoniae NOT DETECTED NOT DETECTED Final   Streptococcus pyogenes NOT DETECTED NOT DETECTED Final   Acinetobacter baumannii NOT DETECTED NOT DETECTED Final   Enterobacteriaceae species NOT DETECTED NOT DETECTED Final   Enterobacter cloacae complex NOT DETECTED NOT DETECTED Final   Escherichia coli NOT DETECTED NOT DETECTED Final   Klebsiella oxytoca NOT DETECTED NOT DETECTED Final   Klebsiella pneumoniae NOT DETECTED NOT DETECTED Final  Proteus species NOT DETECTED NOT DETECTED Final   Serratia marcescens NOT DETECTED NOT DETECTED Final   Haemophilus influenzae NOT DETECTED NOT DETECTED Final   Neisseria meningitidis NOT DETECTED NOT DETECTED Final   Pseudomonas aeruginosa NOT DETECTED NOT DETECTED Final   Candida albicans NOT DETECTED NOT DETECTED Final   Candida glabrata NOT DETECTED NOT DETECTED Final   Candida krusei NOT DETECTED NOT DETECTED Final   Candida parapsilosis NOT DETECTED NOT DETECTED Final   Candida tropicalis NOT DETECTED NOT DETECTED Final  Culture, blood (Routine X 2) w Reflex to ID Panel     Status: None (Preliminary result)   Collection Time: 10/24/16  6:13 PM  Result Value Ref Range Status   Specimen Description BLOOD RIGHT ANTECUBITAL  Final   Special Requests BOTTLES DRAWN AEROBIC AND ANAEROBIC 5CC EACH  Final   Culture NO GROWTH < 24 HOURS  Final   Report Status PENDING  Incomplete  Culture, blood (Routine X 2) w Reflex to ID Panel     Status: None (Preliminary result)   Collection Time: 10/24/16  6:13 PM  Result Value Ref Range Status   Specimen Description BLOOD LEFT ANTECUBITAL  Final   Special Requests BOTTLES DRAWN AEROBIC ONLY 8CC  Final   Culture NO GROWTH < 24 HOURS  Final   Report Status PENDING  Incomplete     Impression/Recommendation  Principal  Problem:   Bacteremia Active Problems:   UTI (urinary tract infection)   HTN (hypertension)   Hypothyroidism   Chronic kidney disease (CKD), stage V (HCC)   History of CVA (cerebrovascular accident)   Depression   Hyperlipidemia   Rachael [name] is a 61 y.o. female with  gram-positive bacteremia with HD catheter in place  #1 HD catheter associated gram-positive bacteremia:  Greatly appreciate fact that HD catheter has been removed and his mother saying she will not longer need dialysis  Repeat blood cultures have been taken and one of them is growing a coag negative staph in one culture  We do not know what the identity of the original gram-positive organism is yet  Therefore will continue with vancomycin and ceftriaxone for now  I will repeat blood cultures again to make sure that we are clearing this organism  She should have a transesophageal echocardiogram to exclude endocarditis  Do not place a new central line until we have proven that she has cleared her bacteremia.   10/25/2016, 12:53 PM   Thank you so much for this interesting consult  [place] [facility] [phone] (pager) [phone] (office) 10/25/2016, 12:53 PM  [place] 10/25/2016, 12:53 PM

## 2016-10-25 NOTE — Progress Notes (Signed)
PROGRESS NOTE    Rachael [name]  ACZ:[id number] DOB: 30-Apr-1956 DOA: 10/23/2016 PCP: Ronita Hipps, MD   Brief Narrative:  61 y.o.femalewith medical history significant of CHF, chronic kidney disease, CVA without residual deficit, hypertension, hypothyroidism, hyperlipidemia who was recently discharged for acute interstitial nephritis on 10/14/2017 (on prednisone 40 mg a day) who is coming to the emergency department referred by her nephrologist after the patient's recently drawn blood cultures grew gram-positive cocci in clusters. The patient stated that she has been experiencing fevers, fatigue, night sweats and chills at home.  Assessment & Plan:   Principal Problem:   Gram-positive bacteremia Active Problems:   UTI (urinary tract infection)   HTN (hypertension)   Hypothyroidism   Chronic kidney disease (CKD), stage V (HCC)   History of CVA (cerebrovascular accident)   Depression   Hyperlipidemia   Hemodialysis catheter infection ([place])  Bacteremia- likely 2/2 HD cath ascess. Now d/c. Leukocytosis Down trending. No fevers. - Continue on cefepime per pharmacy. 2/16 >> - Continue vancomycin per pharmacy. 2/15 >> - TTE negative for vegetations - TEE recommended, 2/6 murmur present on exam, Consult cards am.. - Id consulted, recs appreciated, Still awaiting speciation of initial B/c drawn prior to admission  -Nephrology is following as well. - B/c 2/15- Coag neg staph one bottle,  - B/c 2/16 >> B/c 2/17 >>   HTN (hypertension) Stable. Monitor blood pressure.    Hypothyroidism Continue levothyroxine 100 g by mouth daily. Monitor TSH periodically.    Chronic kidney disease (CKD), stage V ([place])- 2/2 AIN, Improving. Cr- 2.99. Likley 2/2 PPI. -  On prednisone 40mg  daily, continue Monitor BUN, creatinine and electrolytes. Nephrology recs appreciated- No HD required.     Hyperlipidemia Continue atorvastatin 40 mg by mouth daily.    History of CVA. Continue  Plavix.    Depression Continue Celexa 40 mg by mouth daily.   DVT prophylaxis: Lovenox SQ. Code Status: Full code. Family Communication:  Disposition Plan: Admitfor IV antibiotic therapy, echocardiogram, ID consult in AM. Consults called: Nephrology Dr.Upton, ID.  Admission status: Inpatient/stepdown.  Procedures: Echo 2/16- Left ventricle: The cavity size was normal. Wall thickness was   normal. Systolic function was normal. The estimated ejection   fraction was in the range of 60% to 65%. Wall motion was normal;   there were no regional wall motion abnormalities. Left    ventricular diastolic function parameters were normal.  Antimicrobials:   Vanc 2/16 >>  ceftriazone 2/15 >>  Subjective: No complaints today. Denies dizziness, chest pain, SOB.   Objective: Vitals:   10/24/16 2101 10/25/16 0515 10/25/16 0929 10/25/16 1757  BP: 134/68 125/61 (!) 171/77 123/65  Pulse: 77 65 82 81  Resp: 14 16 16 18   Temp: 98.6 F (37 C) 98.7 F (37.1 C) 98.1 F (36.7 C) 98.4 F (36.9 C)  TempSrc: Oral Oral Oral Oral  SpO2: 97% 99% 100% 100%  Weight: 100.5 kg (221 lb 9.6 oz)     Height:        Intake/Output Summary (Last 24 hours) at 10/25/16 1958 Last data filed at 10/25/16 1904  Gross per 24 hour  Intake              720 ml  Output             3200 ml  Net            -2480 ml   Filed Weights   10/23/16 2047 10/24/16 0024 10/24/16 2101  Weight: 97.5 kg (  215 lb) 100.5 kg (221 lb 9.6 oz) 100.5 kg (221 lb 9.6 oz)    Examination:  General exam: Appears calm and comfortable  Respiratory system: Clear to auscultation. Respiratory effort normal. Cardiovascular system: S1 & S2 heard, RRR. 2/6 systolic murmur heard in aortic area. Denies ever told she had a murmur. Gastrointestinal system: Abdomen is nondistended, soft and nontender. No organomegaly or masses felt. Normal bowel sounds heard. Central nervous system: Alert and oriented. No focal neurological  deficits. Extremities: Symmetric 5 x 5 power. Skin: No rashes, lesions or ulcers, HD cath access pulled, clean dressing present. Psychiatry: Judgement and insight appear normal. Mood & affect appropriate.   Data Reviewed: I have personally reviewed following labs and imaging studies  CBC:  Recent Labs Lab 10/23/16 2055 10/24/16 0550 10/25/16 0651  WBC 20.0* 11.0* 7.9  NEUTROABS 18.9* 9.2* 6.3  HGB 11.1* 8.4* 8.6*  HCT 34.1* 26.3* 26.7*  MCV 81.6 81.7 82.2  PLT 214 125* 109*   Basic Metabolic Panel:  Recent Labs Lab 10/23/16 2055 10/24/16 0344 10/25/16 0651  NA 138 139 141  K 4.1 4.0 3.7  CL 107 117* 117*  CO2 16* 12* 18*  GLUCOSE 231* 109* 93  BUN 45* 42* 31*  CREATININE 3.51* 3.04* 2.99*  CALCIUM 9.1 7.4* 8.2*   Liver Function Tests:  Recent Labs Lab 10/23/16 2055  AST 20  ALT 21  ALKPHOS 49  BILITOT 0.9  PROT 7.1  ALBUMIN 3.8   Coagulation Profile:  Recent Labs Lab 10/23/16 2055  INR 1.07   CBG:  Recent Labs Lab 10/24/16 1737 10/24/16 2058 10/25/16 0735 10/25/16 1126 10/25/16 1629  GLUCAP 125* 160* 85 98 140*   Sepsis Labs:  Recent Labs Lab 10/23/16 2111 10/24/16 0344  LATICACIDVEN 3.02* 1.2    Recent Results (from the past 240 hour(s))  Culture, blood (Routine x 2)     Status: None (Preliminary result)   Collection Time: 10/23/16  9:00 PM  Result Value Ref Range Status   Specimen Description BLOOD LEFT ANTECUBITAL  Final   Special Requests BOTTLES DRAWN AEROBIC AND ANAEROBIC 5CC  Final   Culture  Setup Time   Final    GRAM POSITIVE COCCI IN CLUSTERS IN BOTH AEROBIC AND ANAEROBIC BOTTLES CRITICAL VALUE NOTED.  VALUE IS CONSISTENT WITH PREVIOUSLY REPORTED AND CALLED VALUE.    Culture TOO YOUNG TO READ  Final   Report Status PENDING  Incomplete  Culture, blood (Routine x 2)     Status: Abnormal (Preliminary result)   Collection Time: 10/23/16  9:20 PM  Result Value Ref Range Status   Specimen Description BLOOD RIGHT  ANTECUBITAL  Final   Special Requests BOTTLES DRAWN AEROBIC AND ANAEROBIC 5CC  Final   Culture  Setup Time   Final    GRAM POSITIVE COCCI IN CLUSTERS IN BOTH AEROBIC AND ANAEROBIC BOTTLES CRITICAL RESULT CALLED TO, READ BACK BY AND VERIFIED WITH: G ABBOTT PHARMD 2331 10/24/16 A BROWNING    Culture (A)  Final    STAPHYLOCOCCUS SPECIES (COAGULASE NEGATIVE) SUSCEPTIBILITIES TO FOLLOW    Report Status PENDING  Incomplete  Blood Culture ID Panel (Reflexed)     Status: Abnormal   Collection Time: 10/23/16  9:20 PM  Result Value Ref Range Status   Enterococcus species NOT DETECTED NOT DETECTED Final   Listeria monocytogenes NOT DETECTED NOT DETECTED Final   Staphylococcus species DETECTED (A) NOT DETECTED Final    Comment: Methicillin (oxacillin) resistant coagulase negative staphylococcus. Possible blood culture contaminant (unless isolated  from more than one blood culture draw or clinical case suggests pathogenicity). No antibiotic treatment is indicated for blood  culture contaminants. CRITICAL RESULT CALLED TO, READ BACK BY AND VERIFIED WITH: G ABBOTT PHARMD 2331 10/24/16 A BROWNING    Staphylococcus aureus NOT DETECTED NOT DETECTED Final   Methicillin resistance DETECTED (A) NOT DETECTED Final    Comment: CRITICAL RESULT CALLED TO, READ BACK BY AND VERIFIED WITH: G ABBOTT PHARMD 2331 10/24/16 A BROWNING    Streptococcus species NOT DETECTED NOT DETECTED Final   Streptococcus agalactiae NOT DETECTED NOT DETECTED Final   Streptococcus pneumoniae NOT DETECTED NOT DETECTED Final   Streptococcus pyogenes NOT DETECTED NOT DETECTED Final   Acinetobacter baumannii NOT DETECTED NOT DETECTED Final   Enterobacteriaceae species NOT DETECTED NOT DETECTED Final   Enterobacter cloacae complex NOT DETECTED NOT DETECTED Final   Escherichia coli NOT DETECTED NOT DETECTED Final   Klebsiella oxytoca NOT DETECTED NOT DETECTED Final   Klebsiella pneumoniae NOT DETECTED NOT DETECTED Final   Proteus species  NOT DETECTED NOT DETECTED Final   Serratia marcescens NOT DETECTED NOT DETECTED Final   Haemophilus influenzae NOT DETECTED NOT DETECTED Final   Neisseria meningitidis NOT DETECTED NOT DETECTED Final   Pseudomonas aeruginosa NOT DETECTED NOT DETECTED Final   Candida albicans NOT DETECTED NOT DETECTED Final   Candida glabrata NOT DETECTED NOT DETECTED Final   Candida krusei NOT DETECTED NOT DETECTED Final   Candida parapsilosis NOT DETECTED NOT DETECTED Final   Candida tropicalis NOT DETECTED NOT DETECTED Final  Culture, blood (Routine X 2) w Reflex to ID Panel     Status: None (Preliminary result)   Collection Time: 10/24/16  6:13 PM  Result Value Ref Range Status   Specimen Description BLOOD RIGHT ANTECUBITAL  Final   Special Requests BOTTLES DRAWN AEROBIC AND ANAEROBIC 5CC EACH  Final   Culture NO GROWTH < 24 HOURS  Final   Report Status PENDING  Incomplete  Culture, blood (Routine X 2) w Reflex to ID Panel     Status: None (Preliminary result)   Collection Time: 10/24/16  6:13 PM  Result Value Ref Range Status   Specimen Description BLOOD LEFT ANTECUBITAL  Final   Special Requests BOTTLES DRAWN AEROBIC ONLY 8CC  Final   Culture NO GROWTH < 24 HOURS  Final   Report Status PENDING  Incomplete     Radiology Studies: Dg Chest 2 View  Result Date: 10/23/2016 CLINICAL DATA:  Fever, chest pain, shortness of breath. EXAM: CHEST  2 VIEW COMPARISON:  Radiographs of September 25, 2016. FINDINGS: The heart size and mediastinal contours are within normal limits. Both lungs are clear. No pneumothorax or pleural effusion is noted. Eventration of the anterior portion of right hemidiaphragm is noted. Interval placement of right internal jugular catheter with distal tip at the expected position of cavoatrial junction. The visualized skeletal structures are unremarkable. IMPRESSION: No active cardiopulmonary disease. Electronically Signed   By: Marijo Conception, M.D.   On: 10/23/2016 21:21   Scheduled  Meds: . atorvastatin  40 mg Oral Daily  . cefTRIAXone (ROCEPHIN)  IV  2 g Intravenous Q24H  . citalopram  40 mg Oral Daily  . clopidogrel  75 mg Oral Daily  . enoxaparin (LOVENOX) injection  30 mg Subcutaneous Q24H  . famotidine  20 mg Oral Daily  . insulin aspart  0-9 Units Subcutaneous TID WC  . levothyroxine  100 mcg Oral QAC breakfast  . predniSONE  40 mg Oral Q breakfast  .  vancomycin  1,000 mg Intravenous Q48H   Continuous Infusions:   LOS: 2 days    Bethena Roys, MD [facility] Pager [phone]  If 7PM-7AM, please contact night-coverage [email or web address] Password [facility] 10/25/2016, 7:58 PM

## 2016-10-26 DIAGNOSIS — N12 Tubulo-interstitial nephritis, not specified as acute or chronic: Secondary | ICD-10-CM

## 2016-10-26 LAB — CBC
HEMATOCRIT: 27.3 % — AB (ref 36.0–46.0)
HEMOGLOBIN: 8.9 g/dL — AB (ref 12.0–15.0)
MCH: 26.4 pg (ref 26.0–34.0)
MCHC: 32.6 g/dL (ref 30.0–36.0)
MCV: 81 fL (ref 78.0–100.0)
Platelets: 134 10*3/uL — ABNORMAL LOW (ref 150–400)
RBC: 3.37 MIL/uL — ABNORMAL LOW (ref 3.87–5.11)
RDW: 17.9 % — AB (ref 11.5–15.5)
WBC: 9.7 10*3/uL (ref 4.0–10.5)

## 2016-10-26 LAB — BASIC METABOLIC PANEL
ANION GAP: 10 (ref 5–15)
BUN: 38 mg/dL — AB (ref 6–20)
CALCIUM: 8.4 mg/dL — AB (ref 8.9–10.3)
CO2: 18 mmol/L — AB (ref 22–32)
Chloride: 113 mmol/L — ABNORMAL HIGH (ref 101–111)
Creatinine, Ser: 3.27 mg/dL — ABNORMAL HIGH (ref 0.44–1.00)
GFR calc Af Amer: 16 mL/min — ABNORMAL LOW (ref >60)
GFR, EST NON AFRICAN AMERICAN: 14 mL/min — AB (ref >60)
GLUCOSE: 112 mg/dL — AB (ref 65–99)
POTASSIUM: 3.5 mmol/L (ref 3.5–5.1)
Sodium: 141 mmol/L (ref 135–145)

## 2016-10-26 LAB — CK: CK TOTAL: 31 U/L — AB (ref 38–234)

## 2016-10-26 LAB — CULTURE, BLOOD (ROUTINE X 2)

## 2016-10-26 LAB — GLUCOSE, CAPILLARY
GLUCOSE-CAPILLARY: 136 mg/dL — AB (ref 65–99)
Glucose-Capillary: 90 mg/dL (ref 65–99)
Glucose-Capillary: 118 mg/dL — ABNORMAL HIGH (ref 65–99)
Glucose-Capillary: 159 mg/dL — ABNORMAL HIGH (ref 65–99)

## 2016-10-26 LAB — TROPONIN I
Troponin I: <0.03 ng/mL (ref <0.03)
Troponin I: <0.03 ng/mL (ref <0.03)

## 2016-10-26 LAB — TSH: TSH: 0.2 u[IU]/mL — ABNORMAL LOW (ref 0.350–4.500)

## 2016-10-26 MED ORDER — SODIUM CHLORIDE 0.9 % IV SOLN
6.0000 mg/kg | INTRAVENOUS | Status: DC
  Administered 2016-10-26 – 2016-10-30 (×3): 598 mg via INTRAVENOUS
  Filled 2016-10-26 (×3): qty 11.96

## 2016-10-26 MED ORDER — CITALOPRAM HYDROBROMIDE 20 MG PO TABS
20.0000 mg | ORAL_TABLET | Freq: Every day | ORAL | Status: DC
  Administered 2016-10-26 – 2016-10-30 (×5): 20 mg via ORAL
  Filled 2016-10-26 (×5): qty 1

## 2016-10-26 MED ORDER — METOPROLOL TARTRATE 25 MG PO TABS
25.0000 mg | ORAL_TABLET | Freq: Two times a day (BID) | ORAL | Status: DC
  Administered 2016-10-26 – 2016-10-29 (×6): 25 mg via ORAL
  Filled 2016-10-26 (×8): qty 1

## 2016-10-26 NOTE — Progress Notes (Addendum)
PROGRESS NOTE    Rachael [name]  AVW:[id number] DOB: June 02, 1956 DOA: 10/23/2016 PCP: Ronita Hipps, MD   Brief Narrative:  61 y.o.femalewith medical history significant of CHF, chronic kidney disease, CVA without residual deficit, hypertension, hypothyroidism, hyperlipidemia who was recently discharged for acute interstitial nephritis on 10/14/2017 (on prednisone 40 mg a day) who is coming to the emergency department referred by her nephrologist after the patient's recently drawn blood cultures grew gram-positive cocci in clusters. The patient stated that she has been experiencing fevers, fatigue, night sweats and chills at home.  Assessment & Plan:   Principal Problem:   Gram-positive bacteremia Active Problems:   UTI (urinary tract infection)   HTN (hypertension)   Hypothyroidism   Chronic kidney disease (CKD), stage V (HCC)   History of CVA (cerebrovascular accident)   Depression   Hyperlipidemia   Hemodialysis catheter infection ([place])  Bacteremia- likely 2/2 HD cath ascess. Now d/c. Leukocytosis Down trending. No fevers. - Vanc and cefepime per pharmacy. 2/16 >>2/18 - Started on IV Daptomycin 2/17>> - TTE negative for vegetations - Cards Consulted for TEE 2/18. They will see pt, NPO from midnight.  - ID and nephrology recs appreciated.  - Nephro-recs appreciated- No more HD. Cont prednisone.  - [facility]- drawn 10/21/16, MR coagulase negative staph X2 - B/c 2/15- Coag neg staph one bottle,  - B/c 2/16 >> - B/c 2/17 >>  Paroxysmal A. Fib.- Symptomatic, this a.m. No previous occurrence. Likely related to current bacteremia infection. Now NSR. Troponins 3 negative. TSH - normal. EKG revealing atrial fibrillation, rates- 127. Patient denies history of irregular heart rhythm. History of TIA lasting a few hrs, ~2 years ago without residual deficit, on plavix. CHAD2Vasc Score- 4. Talked to pt about anticoagulation, risks and benefit.  Considering 1 time occurrence with ongoing bacteremia, doubt need for anticoag at this time.  - Low TSH, on treatment for hypothyroidism. - Talk to cards about anticoag tomorrow - TTE 2/6 1unremarkable   HTN (hypertension) Intermittently Elevated BP, no antihypertensives on home medication list. - Start metoprolol 25 mg twice a day    Hypothyroidism- Continue levothyroxine 100 g by mouth daily. TSH low- 0.2. - F/u in outpt setting, after resolution of acute illness, ?over supplimentation.    Chronic kidney disease (CKD), stage V ([place])- 2/2 AIN,  -  On prednisone 40mg  daily, continue - Nephrology recs appreciated- kidney function improving, No HD required.    Hyperlipidemia Continue atorvastatin 40 mg by mouth daily.    History of CVA. Continue Plavix.    Depression Continue Celexa 40 mg by mouth daily.  DVT prophylaxis: Lovenox SQ. Code Status: Full code. Family Communication:  Disposition Plan: Admitfor IV antibiotic therapy, echocardiogram, ID consult in AM. Consults called: Nephrology Dr.Upton, ID.  Admission status: Inpatient/stepdown.  Procedures: Echo 2/16- Left ventricle: The cavity size was normal. Wall thickness was   normal. Systolic function was normal. The estimated ejection   fraction was in the range of 60% to 65%. Wall motion was normal;   there were no regional wall motion abnormalities. Left    ventricular diastolic function parameters were normal.  Antimicrobials:   Vanc 2/16 >>2/18  ceftriazone 2/15 >>2/18  Daptomycin 2/18>>  Subjective: No complaints now, in the morning felt bubbling in her chest, when she was in A. Fib. She denies having it now,   Objective: Vitals:   10/25/16 1757 10/25/16 2048 10/26/16 0332 10/26/16 0618  BP: 123/65 124/66 (!) 161/80 (!) 153/65  Pulse: 81 70 80 84  Resp: 18 19 18 19   Temp: 98.4 F (36.9 C) 98.1 F (36.7 C) 98.1 F (36.7 C) 98.4 F (36.9 C)  TempSrc: Oral Oral Oral Oral  SpO2: 100% 98% 100%  100%  Weight:  99.7 kg (219 lb 14.4 oz)    Height:        Intake/Output Summary (Last 24 hours) at 10/26/16 1209 Last data filed at 10/26/16 5732  Gross per 24 hour  Intake              730 ml  Output             1700 ml  Net             -970 ml   Filed Weights   10/24/16 0024 10/24/16 2101 10/25/16 2048  Weight: 100.5 kg (221 lb 9.6 oz) 100.5 kg (221 lb 9.6 oz) 99.7 kg (219 lb 14.4 oz)    Examination:  General exam: Appears calm and comfortable  Respiratory system: Clear to auscultation. Respiratory effort normal. Cardiovascular system: S1 & S2 heard, RRR. 2/6 systolic murmur heard in aortic area. Denies ever told she had a murmur. Gastrointestinal system: Abdomen is nondistended, soft and nontender. No organomegaly or masses felt. Normal bowel sounds heard. Central nervous system: Alert and oriented. No focal neurological deficits. Extremities: Symmetric 5 x 5 power. Skin: No rashes, lesions or ulcers Psychiatry: Judgement and insight appear normal. Mood & affect appropriate.   Data Reviewed: I have personally reviewed following labs and imaging studies  CBC:  Recent Labs Lab 10/23/16 2055 10/24/16 0550 10/25/16 0651 10/26/16 0355  WBC 20.0* 11.0* 7.9 9.7  NEUTROABS 18.9* 9.2* 6.3  --   HGB 11.1* 8.4* 8.6* 8.9*  HCT 34.1* 26.3* 26.7* 27.3*  MCV 81.6 81.7 82.2 81.0  PLT 214 125* 115* 202*   Basic Metabolic Panel:  Recent Labs Lab 10/23/16 2055 10/24/16 0344 10/25/16 0651 10/26/16 0355  NA 138 139 141 141  K 4.1 4.0 3.7 3.5  CL 107 117* 117* 113*  CO2 16* 12* 18* 18*  GLUCOSE 231* 109* 93 112*  BUN 45* 42* 31* 38*  CREATININE 3.51* 3.04* 2.99* 3.27*  CALCIUM 9.1 7.4* 8.2* 8.4*   Liver Function Tests:  Recent Labs Lab 10/23/16 2055  AST 20  ALT 21  ALKPHOS 49  BILITOT 0.9  PROT 7.1  ALBUMIN 3.8   Coagulation Profile:  Recent Labs Lab 10/23/16 2055  INR 1.07   CBG:  Recent Labs Lab 10/25/16 0735 10/25/16 1126 10/25/16 1629  10/25/16 2045 10/26/16 0807  GLUCAP 85 98 140* 140* 90   Sepsis Labs:  Recent Labs Lab 10/23/16 2111 10/24/16 0344  LATICACIDVEN 3.02* 1.2    Recent Results (from the past 240 hour(s))  Culture, blood (Routine x 2)     Status: Abnormal (Preliminary result)   Collection Time: 10/23/16  9:00 PM  Result Value Ref Range Status   Specimen Description BLOOD LEFT ANTECUBITAL  Final   Special Requests BOTTLES DRAWN AEROBIC AND ANAEROBIC 5CC  Final   Culture  Setup Time   Final    GRAM POSITIVE COCCI IN CLUSTERS IN BOTH AEROBIC AND ANAEROBIC BOTTLES CRITICAL VALUE NOTED.  VALUE IS CONSISTENT WITH PREVIOUSLY REPORTED AND CALLED VALUE.    Culture (A)  Final    STAPHYLOCOCCUS SPECIES (COAGULASE NEGATIVE) SUSCEPTIBILITIES PERFORMED ON PREVIOUS CULTURE WITHIN THE LAST 5 DAYS.    Report Status PENDING  Incomplete  Culture, blood (Routine x 2)  Status: Abnormal   Collection Time: 10/23/16  9:20 PM  Result Value Ref Range Status   Specimen Description BLOOD RIGHT ANTECUBITAL  Final   Special Requests BOTTLES DRAWN AEROBIC AND ANAEROBIC 5CC  Final   Culture  Setup Time   Final    GRAM POSITIVE COCCI IN CLUSTERS IN BOTH AEROBIC AND ANAEROBIC BOTTLES CRITICAL RESULT CALLED TO, READ BACK BY AND VERIFIED WITH: G ABBOTT PHARMD [id number] 10/24/16 A BROWNING    Culture STAPHYLOCOCCUS SPECIES (COAGULASE NEGATIVE) (A)  Final   Report Status 10/26/2016 FINAL  Final   Organism ID, Bacteria STAPHYLOCOCCUS SPECIES (COAGULASE NEGATIVE)  Final      Susceptibility   Staphylococcus species (coagulase negative) - MIC*    CIPROFLOXACIN <=0.5 SENSITIVE Sensitive     ERYTHROMYCIN >=8 RESISTANT Resistant     GENTAMICIN <=0.5 SENSITIVE Sensitive     OXACILLIN >=4 RESISTANT Resistant     TETRACYCLINE 2 SENSITIVE Sensitive     VANCOMYCIN 2 SENSITIVE Sensitive     TRIMETH/SULFA <=10 SENSITIVE Sensitive     CLINDAMYCIN >=8 RESISTANT Resistant     RIFAMPIN <=0.5 SENSITIVE Sensitive     Inducible Clindamycin  NEGATIVE Sensitive     * STAPHYLOCOCCUS SPECIES (COAGULASE NEGATIVE)  Blood Culture ID Panel (Reflexed)     Status: Abnormal   Collection Time: 10/23/16  9:20 PM  Result Value Ref Range Status   Enterococcus species NOT DETECTED NOT DETECTED Final   Listeria monocytogenes NOT DETECTED NOT DETECTED Final   Staphylococcus species DETECTED (A) NOT DETECTED Final    Comment: Methicillin (oxacillin) resistant coagulase negative staphylococcus. Possible blood culture contaminant (unless isolated from more than one blood culture draw or clinical case suggests pathogenicity). No antibiotic treatment is indicated for blood  culture contaminants. CRITICAL RESULT CALLED TO, READ BACK BY AND VERIFIED WITH: G ABBOTT PHARMD 2331 10/24/16 A BROWNING    Staphylococcus aureus NOT DETECTED NOT DETECTED Final   Methicillin resistance DETECTED (A) NOT DETECTED Final    Comment: CRITICAL RESULT CALLED TO, READ BACK BY AND VERIFIED WITH: G ABBOTT PHARMD 2331 10/24/16 A BROWNING    Streptococcus species NOT DETECTED NOT DETECTED Final   Streptococcus agalactiae NOT DETECTED NOT DETECTED Final   Streptococcus pneumoniae NOT DETECTED NOT DETECTED Final   Streptococcus pyogenes NOT DETECTED NOT DETECTED Final   Acinetobacter baumannii NOT DETECTED NOT DETECTED Final   Enterobacteriaceae species NOT DETECTED NOT DETECTED Final   Enterobacter cloacae complex NOT DETECTED NOT DETECTED Final   Escherichia coli NOT DETECTED NOT DETECTED Final   Klebsiella oxytoca NOT DETECTED NOT DETECTED Final   Klebsiella pneumoniae NOT DETECTED NOT DETECTED Final   Proteus species NOT DETECTED NOT DETECTED Final   Serratia marcescens NOT DETECTED NOT DETECTED Final   Haemophilus influenzae NOT DETECTED NOT DETECTED Final   Neisseria meningitidis NOT DETECTED NOT DETECTED Final   Pseudomonas aeruginosa NOT DETECTED NOT DETECTED Final   Candida albicans NOT DETECTED NOT DETECTED Final   Candida glabrata NOT DETECTED NOT DETECTED  Final   Candida krusei NOT DETECTED NOT DETECTED Final   Candida parapsilosis NOT DETECTED NOT DETECTED Final   Candida tropicalis NOT DETECTED NOT DETECTED Final  Culture, blood (Routine X 2) w Reflex to ID Panel     Status: None (Preliminary result)   Collection Time: 10/24/16  6:13 PM  Result Value Ref Range Status   Specimen Description BLOOD RIGHT ANTECUBITAL  Final   Special Requests BOTTLES DRAWN AEROBIC AND ANAEROBIC 5CC EACH  Final   Culture NO  GROWTH 2 DAYS  Final   Report Status PENDING  Incomplete  Culture, blood (Routine X 2) w Reflex to ID Panel     Status: None (Preliminary result)   Collection Time: 10/24/16  6:13 PM  Result Value Ref Range Status   Specimen Description BLOOD LEFT ANTECUBITAL  Final   Special Requests BOTTLES DRAWN AEROBIC ONLY 8CC  Final   Culture NO GROWTH 2 DAYS  Final   Report Status PENDING  Incomplete  Culture, blood (Routine X 2) w Reflex to ID Panel     Status: None (Preliminary result)   Collection Time: 10/25/16 11:10 AM  Result Value Ref Range Status   Specimen Description BLOOD RIGHT ANTECUBITAL  Final   Special Requests BOTTLES DRAWN AEROBIC ONLY  5CC  Final   Culture NO GROWTH < 24 HOURS  Final   Report Status PENDING  Incomplete  Culture, blood (Routine X 2) w Reflex to ID Panel     Status: None (Preliminary result)   Collection Time: 10/25/16 11:15 AM  Result Value Ref Range Status   Specimen Description BLOOD LEFT ARM  Final   Special Requests BOTTLES DRAWN AEROBIC ONLY  5CC  Final   Culture NO GROWTH < 24 HOURS  Final   Report Status PENDING  Incomplete     Radiology Studies: No results found. Scheduled Meds: . citalopram  20 mg Oral Daily  . clopidogrel  75 mg Oral Daily  . DAPTOmycin (CUBICIN)  IV  6 mg/kg Intravenous Q48H  . enoxaparin (LOVENOX) injection  30 mg Subcutaneous Q24H  . famotidine  20 mg Oral Daily  . insulin aspart  0-9 Units Subcutaneous TID WC  . levothyroxine  100 mcg Oral QAC breakfast  . predniSONE   40 mg Oral Q breakfast   Continuous Infusions:   LOS: 3 days    Bethena Roys, MD [facility] Pager [phone]  If 7PM-7AM, please contact night-coverage [email or web address] Password [facility] 10/26/2016, 12:09 PM

## 2016-10-26 NOTE — Progress Notes (Signed)
Subjective: No new complaints    Antibiotics:  Anti-infectives    Start     Dose/Rate Route Frequency Ordered Stop   10/26/16 1800  DAPTOmycin (CUBICIN) 598 mg in sodium chloride 0.9 % IVPB     6 mg/kg  99.7 kg 223.9 mL/hr over 30 Minutes Intravenous Every 48 hours 10/26/16 1039     10/25/16 2000  vancomycin (VANCOCIN) IVPB 1000 mg/200 mL premix  Status:  Discontinued     1,000 mg 200 mL/hr over 60 Minutes Intravenous Every 48 hours 10/24/16 1210 10/26/16 1022   10/24/16 2200  ceFEPIme (MAXIPIME) 1 g in dextrose 5 % 50 mL IVPB  Status:  Discontinued     1 g 100 mL/hr over 30 Minutes Intravenous Every 24 hours 10/23/16 2149 10/24/16 1618   10/24/16 1800  cefTRIAXone (ROCEPHIN) 2 g in dextrose 5 % 50 mL IVPB  Status:  Discontinued     2 g 100 mL/hr over 30 Minutes Intravenous Every 24 hours 10/24/16 1626 10/26/16 1022   10/23/16 2145  levofloxacin (LEVAQUIN) IVPB 750 mg  Status:  Discontinued     750 mg 100 mL/hr over 90 Minutes Intravenous  Once 10/23/16 2137 10/23/16 2141   10/23/16 2145  aztreonam (AZACTAM) 2 g in dextrose 5 % 50 mL IVPB  Status:  Discontinued     2 g 100 mL/hr over 30 Minutes Intravenous  Once 10/23/16 2137 10/23/16 2141   10/23/16 2145  vancomycin (VANCOCIN) IVPB 1000 mg/200 mL premix  Status:  Discontinued     1,000 mg 200 mL/hr over 60 Minutes Intravenous  Once 10/23/16 2137 10/23/16 2141   10/23/16 2145  vancomycin (VANCOCIN) 2,000 mg in sodium chloride 0.9 % 500 mL IVPB     2,000 mg 250 mL/hr over 120 Minutes Intravenous  Once 10/23/16 2141 10/24/16 0002   10/23/16 2145  ceFEPIme (MAXIPIME) 2 g in dextrose 5 % 50 mL IVPB     2 g 100 mL/hr over 30 Minutes Intravenous  Once 10/23/16 2141 10/23/16 2217      Medications: Scheduled Meds: . citalopram  20 mg Oral Daily  . clopidogrel  75 mg Oral Daily  . DAPTOmycin (CUBICIN)  IV  6 mg/kg Intravenous Q48H  . enoxaparin (LOVENOX) injection  30 mg Subcutaneous Q24H  . famotidine  20 mg Oral  Daily  . insulin aspart  0-9 Units Subcutaneous TID WC  . levothyroxine  100 mcg Oral QAC breakfast  . predniSONE  40 mg Oral Q breakfast   Continuous Infusions: PRN Meds:.    Objective: Weight change: -1 lb 11.2 oz (-0.771 kg)  Intake/Output Summary (Last 24 hours) at 10/26/16 1302 Last data filed at 10/26/16 8250  Gross per 24 hour  Intake              610 ml  Output             1150 ml  Net             -540 ml   Blood pressure (!) 153/65, pulse 84, temperature 98.4 F (36.9 C), temperature source Oral, resp. rate 19, height 5\' 2"  (1.575 m), weight 219 lb 14.4 oz (99.7 kg), SpO2 100 %. Temp:  [98.1 F (36.7 C)-98.4 F (36.9 C)] 98.4 F (36.9 C) (02/18 0618) Pulse Rate:  [70-84] 84 (02/18 0618) Resp:  [18-19] 19 (02/18 0618) BP: (123-161)/(65-80) 153/65 (02/18 0618) SpO2:  [98 %-100 %] 100 % (02/18 0618) Weight:  [219 lb  14.4 oz (99.7 kg)] 219 lb 14.4 oz (99.7 kg) (02/17 2048)  Physical Exam: General: Alert and awake, oriented x3, not in any acute distress. HEENT: anicteric sclera,  EOMI, oropharynx clear and without exudate Cardiovascular: regular rate, normal r,  no murmur rubs or gallops Pulmonary: clear to auscultation bilaterally, no wheezing, rales or rhonchi Gastrointestinal: soft nontender, nondistended, normal bowel sounds, Musculoskeletal: no  clubbing or edema noted bilaterally Skin, bandage in place were HD catheter was  Neuro: nonfocal, strength and sensation intact   CBC: CBC Latest Ref Rng & Units 10/26/2016 10/25/2016 10/24/2016  WBC 4.0 - 10.5 K/uL 9.7 7.9 11.0(H)  Hemoglobin 12.0 - 15.0 g/dL 8.9(L) 8.6(L) 8.4(L)  Hematocrit 36.0 - 46.0 % 27.3(L) 26.7(L) 26.3(L)  Platelets 150 - 400 K/uL 134(L) 115(L) 125(L)     BMET  Recent Labs  10/25/16 0651 10/26/16 0355  NA 141 141  K 3.7 3.5  CL 117* 113*  CO2 18* 18*  GLUCOSE 93 112*  BUN 31* 38*  CREATININE 2.99* 3.27*  CALCIUM 8.2* 8.4*     Liver Panel   Recent Labs  10/23/16 2055    PROT 7.1  ALBUMIN 3.8  AST 20  ALT 21  ALKPHOS 49  BILITOT 0.9       Sedimentation Rate No results for input(s): ESRSEDRATE in the last 72 hours. C-Reactive Protein No results for input(s): CRP in the last 72 hours.  Micro Results: Recent Results (from the past 720 hour(s))  Culture, blood (Routine x 2)     Status: Abnormal (Preliminary result)   Collection Time: 10/23/16  9:00 PM  Result Value Ref Range Status   Specimen Description BLOOD LEFT ANTECUBITAL  Final   Special Requests BOTTLES DRAWN AEROBIC AND ANAEROBIC 5CC  Final   Culture  Setup Time   Final    GRAM POSITIVE COCCI IN CLUSTERS IN BOTH AEROBIC AND ANAEROBIC BOTTLES CRITICAL VALUE NOTED.  VALUE IS CONSISTENT WITH PREVIOUSLY REPORTED AND CALLED VALUE.    Culture (A)  Final    STAPHYLOCOCCUS SPECIES (COAGULASE NEGATIVE) SUSCEPTIBILITIES PERFORMED ON PREVIOUS CULTURE WITHIN THE LAST 5 DAYS.    Report Status PENDING  Incomplete  Culture, blood (Routine x 2)     Status: Abnormal   Collection Time: 10/23/16  9:20 PM  Result Value Ref Range Status   Specimen Description BLOOD RIGHT ANTECUBITAL  Final   Special Requests BOTTLES DRAWN AEROBIC AND ANAEROBIC 5CC  Final   Culture  Setup Time   Final    GRAM POSITIVE COCCI IN CLUSTERS IN BOTH AEROBIC AND ANAEROBIC BOTTLES CRITICAL RESULT CALLED TO, READ BACK BY AND VERIFIED WITH: G ABBOTT PHARMD 2331 10/24/16 A BROWNING    Culture STAPHYLOCOCCUS SPECIES (COAGULASE NEGATIVE) (A)  Final   Report Status 10/26/2016 FINAL  Final   Organism ID, Bacteria STAPHYLOCOCCUS SPECIES (COAGULASE NEGATIVE)  Final      Susceptibility   Staphylococcus species (coagulase negative) - MIC*    CIPROFLOXACIN <=0.5 SENSITIVE Sensitive     ERYTHROMYCIN >=8 RESISTANT Resistant     GENTAMICIN <=0.5 SENSITIVE Sensitive     OXACILLIN >=4 RESISTANT Resistant     TETRACYCLINE 2 SENSITIVE Sensitive     VANCOMYCIN 2 SENSITIVE Sensitive     TRIMETH/SULFA <=10 SENSITIVE Sensitive     CLINDAMYCIN  >=8 RESISTANT Resistant     RIFAMPIN <=0.5 SENSITIVE Sensitive     Inducible Clindamycin NEGATIVE Sensitive     * STAPHYLOCOCCUS SPECIES (COAGULASE NEGATIVE)  Blood Culture ID Panel (Reflexed)  Status: Abnormal   Collection Time: 10/23/16  9:20 PM  Result Value Ref Range Status   Enterococcus species NOT DETECTED NOT DETECTED Final   Listeria monocytogenes NOT DETECTED NOT DETECTED Final   Staphylococcus species DETECTED (A) NOT DETECTED Final    Comment: Methicillin (oxacillin) resistant coagulase negative staphylococcus. Possible blood culture contaminant (unless isolated from more than one blood culture draw or clinical case suggests pathogenicity). No antibiotic treatment is indicated for blood  culture contaminants. CRITICAL RESULT CALLED TO, READ BACK BY AND VERIFIED WITH: G ABBOTT PHARMD 2331 10/24/16 A BROWNING    Staphylococcus aureus NOT DETECTED NOT DETECTED Final   Methicillin resistance DETECTED (A) NOT DETECTED Final    Comment: CRITICAL RESULT CALLED TO, READ BACK BY AND VERIFIED WITH: G ABBOTT PHARMD 2331 10/24/16 A BROWNING    Streptococcus species NOT DETECTED NOT DETECTED Final   Streptococcus agalactiae NOT DETECTED NOT DETECTED Final   Streptococcus pneumoniae NOT DETECTED NOT DETECTED Final   Streptococcus pyogenes NOT DETECTED NOT DETECTED Final   Acinetobacter baumannii NOT DETECTED NOT DETECTED Final   Enterobacteriaceae species NOT DETECTED NOT DETECTED Final   Enterobacter cloacae complex NOT DETECTED NOT DETECTED Final   Escherichia coli NOT DETECTED NOT DETECTED Final   Klebsiella oxytoca NOT DETECTED NOT DETECTED Final   Klebsiella pneumoniae NOT DETECTED NOT DETECTED Final   Proteus species NOT DETECTED NOT DETECTED Final   Serratia marcescens NOT DETECTED NOT DETECTED Final   Haemophilus influenzae NOT DETECTED NOT DETECTED Final   Neisseria meningitidis NOT DETECTED NOT DETECTED Final   Pseudomonas aeruginosa NOT DETECTED NOT DETECTED Final    Candida albicans NOT DETECTED NOT DETECTED Final   Candida glabrata NOT DETECTED NOT DETECTED Final   Candida krusei NOT DETECTED NOT DETECTED Final   Candida parapsilosis NOT DETECTED NOT DETECTED Final   Candida tropicalis NOT DETECTED NOT DETECTED Final  Culture, blood (Routine X 2) w Reflex to ID Panel     Status: None (Preliminary result)   Collection Time: 10/24/16  6:13 PM  Result Value Ref Range Status   Specimen Description BLOOD RIGHT ANTECUBITAL  Final   Special Requests BOTTLES DRAWN AEROBIC AND ANAEROBIC 5CC EACH  Final   Culture NO GROWTH 2 DAYS  Final   Report Status PENDING  Incomplete  Culture, blood (Routine X 2) w Reflex to ID Panel     Status: None (Preliminary result)   Collection Time: 10/24/16  6:13 PM  Result Value Ref Range Status   Specimen Description BLOOD LEFT ANTECUBITAL  Final   Special Requests BOTTLES DRAWN AEROBIC ONLY 8CC  Final   Culture NO GROWTH 2 DAYS  Final   Report Status PENDING  Incomplete  Culture, blood (Routine X 2) w Reflex to ID Panel     Status: None (Preliminary result)   Collection Time: 10/25/16 11:10 AM  Result Value Ref Range Status   Specimen Description BLOOD RIGHT ANTECUBITAL  Final   Special Requests BOTTLES DRAWN AEROBIC ONLY  5CC  Final   Culture NO GROWTH < 24 HOURS  Final   Report Status PENDING  Incomplete  Culture, blood (Routine X 2) w Reflex to ID Panel     Status: None (Preliminary result)   Collection Time: 10/25/16 11:15 AM  Result Value Ref Range Status   Specimen Description BLOOD LEFT ARM  Final   Special Requests BOTTLES DRAWN AEROBIC ONLY  5CC  Final   Culture NO GROWTH < 24 HOURS  Final   Report Status PENDING  Incomplete    Studies/Results: No results found.    Assessment/Plan:  INTERVAL HISTORY:  Dr. Hollie Salk obtained cultures from [facility] and the patient DID grow MR CO AG NEG STAPH from blood cultures  See below:       Principal Problem:   Gram-positive bacteremia Active  Problems:   UTI (urinary tract infection)   HTN (hypertension)   Hypothyroidism   Chronic kidney disease (CKD), stage V (HCC)   History of CVA (cerebrovascular accident)   Depression   Hyperlipidemia   Hemodialysis catheter infection (HCC)    Rachael [name] is a 61 y.o. female with Coagulase-negative staphylococcal bacteremia due to HD catheter  #1 HD catheter associated  MR coagulase-negative staphylococcal bacteremia    GREATLY appreciate Dr. Bishop Dublin help with tracking down these cultures  She appears to finally be clearing her bacteremia  I would still favor TEE to exclude endocarditis  I am changing to daptomycin to avoid nephrotoxicity of vancomycin in this patient with reovering renal function from interstitial nephritis.  Dr. Graylon Good to take over the case tomorrow.   LOS: 3 days   Alcide Evener 10/26/2016, 1:02 PM

## 2016-10-26 NOTE — Progress Notes (Signed)
Pharmacy Antibiotic Note  Princetta Uplinger Hollinsworth is a 61 y.o. female admitted on 10/23/2016 with bacteremia.  Pharmacy has been consulted for daptomycin dosing. Patient was previously being treated with vanc/ceftriaxone for 3 days. Today is day 4 of antibiotics for methicillin-resistant coag negative staph. WBC are within normal limits at 9.7 and patient is afebrile. Creatinine is up at 3.27 with CrCl ~ 20 ml/min. Last does of Vanc/ceftriaxone given 2/17 PM.  Plan: Daptomycin 6 mg/kg ( 598 mg) every 48 hours. Baseline Creatine Kinase and weekly creatine kinase Discontinue atorvastatin while patient on Daptomycin therapy Monitor cultures and s/sx of clinical improvement.   Height: 5\' 2"  (157.5 cm) Weight: 219 lb 14.4 oz (99.7 kg) IBW/kg (Calculated) : 50.1  Temp (24hrs), Avg:98.3 F (36.8 C), Min:98.1 F (36.7 C), Max:98.4 F (36.9 C)   Recent Labs Lab 10/23/16 2055 10/23/16 2111 10/24/16 0344 10/24/16 0550 10/25/16 0651 10/26/16 0355  WBC 20.0*  --   --  11.0* 7.9 9.7  CREATININE 3.51*  --  3.04*  --  2.99* 3.27*  LATICACIDVEN  --  3.02* 1.2  --   --   --     Estimated Creatinine Clearance: 19.7 mL/min (by C-G formula based on SCr of 3.27 mg/dL (H)).    Allergies  Allergen Reactions  . Proton Pump Inhibitors     Biopsy proven acute interstitial nephritis; required dialysis in 09/2016  . Penicillins Rash and Other (See Comments)    Tolerates rocephin  . Sulfa Antibiotics Rash    Antimicrobials this admission: Ceftriaxone 2/16>>2/18 Cefepime 2/15 >> 2/16 Vancomycin 2/15  >>2/18 Daptomycin 2/18>>    Microbiology results: 2/12 BCx (done at [facility], called to Renal MDs): GPC in clusters  2/15 BCx: 2o2 MRSE 2/16 BCx: NGTD 2/17 BCx: NGTD Thank you for allowing pharmacy to be a part of this patient's care.  Ihor Austin, PharmD PGY1 Pharmacy Resident Pager: [phone] 10/26/2016 10:31 AM

## 2016-10-26 NOTE — Progress Notes (Signed)
  [facility] Progress Note    Assessment/ Plan:    1.  Acute interstitial nephritis: Creatinine continues to improve.  She does not require any more dialysis.  Continue prednisone 40 mg daily (started 1/30-).  PPIs listed as an allergy in Epic.  2.  Sepsis: +GPC bacteremia, WBC ct, Lactate of 3.0 on admission. Cultures at [place] 2/13 CONS with methicillin resistance; here 2/15 + CONS with methicillin resistance.  Repeat cultures 2/16 pending.   TTE without overt vegetations.  ID now following, appreciate assistance from Dr. Tommy Medal.  Switching abx to dapto (2/18-). TEE pending, hopefully will be tomorrow.  3.  HLD: atorvastatin  4. h/o CVA; on Plavix-- perhaps Afib is why she had her CVA in the first place  5.  Afib: per primary.  Cycling troponins; EKGs without overt ischemia.   Appreciate cardiology involvement.  Subjective:    Feeling much better. Final blood cultures from 2/13 ([facility]) showing S. Epidermidis R to clinda, erythro, oxacillin, penicillin.  Had atrial fibrillation overnight.   Objective:   BP (!) 153/65 (BP Location: Right Arm)   Pulse 84   Temp 98.4 F (36.9 C) (Oral)   Resp 19   Ht 5\' 2"  (1.575 m)   Wt 99.7 kg (219 lb 14.4 oz)   SpO2 100%   BMI 40.22 kg/m   Intake/Output Summary (Last 24 hours) at 10/26/16 1104 Last data filed at 10/26/16 7867  Gross per 24 hour  Intake              730 ml  Output             1700 ml  Net             -970 ml   Weight change: -0.771 kg (-1 lb 11.2 oz)  Physical Exam: GEN: WDWN, lying in bed, NAD  HEENT EOMI, PERRL, MMM NECK no JVD CHEST: nontunneled catheter removed, dressing c/d/i PULM CTAB no c/w/r CV RRR this AM soft systolic murmur LUSB ABD sot, nontender, nondistended, NABS EXT no LE edema NEURO: AAO x 3, nonfocal  Imaging: No results found.  Labs: BMET  Recent Labs Lab 10/23/16 2055 10/24/16 0344 10/25/16 0651 10/26/16 0355  NA 138 139 141 141  K 4.1 4.0 3.7 3.5  CL 107  117* 117* 113*  CO2 16* 12* 18* 18*  GLUCOSE 231* 109* 93 112*  BUN 45* 42* 31* 38*  CREATININE 3.51* 3.04* 2.99* 3.27*  CALCIUM 9.1 7.4* 8.2* 8.4*   CBC  Recent Labs Lab 10/23/16 2055 10/24/16 0550 10/25/16 0651 10/26/16 0355  WBC 20.0* 11.0* 7.9 9.7  NEUTROABS 18.9* 9.2* 6.3  --   HGB 11.1* 8.4* 8.6* 8.9*  HCT 34.1* 26.3* 26.7* 27.3*  MCV 81.6 81.7 82.2 81.0  PLT 214 125* 115* 134*    Medications:    . citalopram  20 mg Oral Daily  . clopidogrel  75 mg Oral Daily  . DAPTOmycin (CUBICIN)  IV  6 mg/kg Intravenous Q48H  . enoxaparin (LOVENOX) injection  30 mg Subcutaneous Q24H  . famotidine  20 mg Oral Daily  . insulin aspart  0-9 Units Subcutaneous TID WC  . levothyroxine  100 mcg Oral QAC breakfast  . predniSONE  40 mg Oral Q breakfast      Madelon Lips, MD [place] pgr [phone] 10/26/2016, 11:04 AM

## 2016-10-26 NOTE — Progress Notes (Signed)
Central Monitor called to advise that patient's rhythm was changing from NSR - 70's/80's - to AFIB - 120's/130 - and back to NSR.  EKG done and showed NSR to AFIB with RVR and back to NSR.  Patient is c/o "bubbling" in chest but no dizziness, chest pain, shortness of breath, or chest pressure.  VS showed B/P 161/80 and HR 80.  MD called and made aware.  Lab orders received.  Will call MD if patient sustains rhythm other than NSR.  Patient made aware of plan and questions answered.  Will continue to monitor patient.  Earleen Reaper RN-BC, [employer]

## 2016-10-27 ENCOUNTER — Inpatient Hospital Stay (HOSPITAL_COMMUNITY): Payer: Medicare HMO

## 2016-10-27 ENCOUNTER — Encounter (HOSPITAL_COMMUNITY)
Admission: EM | Disposition: A | Discharge status: Home / Self Care | Payer: Self-pay | Source: Home / Self Care | Attending: Internal Medicine

## 2016-10-27 ENCOUNTER — Encounter (HOSPITAL_COMMUNITY): Payer: Self-pay | Admitting: *Deleted

## 2016-10-27 DIAGNOSIS — I340 Nonrheumatic mitral (valve) insufficiency: Secondary | ICD-10-CM

## 2016-10-27 DIAGNOSIS — E039 Hypothyroidism, unspecified: Secondary | ICD-10-CM

## 2016-10-27 DIAGNOSIS — R7881 Bacteremia: Secondary | ICD-10-CM

## 2016-10-27 DIAGNOSIS — B957 Other staphylococcus as the cause of diseases classified elsewhere: Secondary | ICD-10-CM

## 2016-10-27 DIAGNOSIS — T827XXD Infection and inflammatory reaction due to other cardiac and vascular devices, implants and grafts, subsequent encounter: Secondary | ICD-10-CM

## 2016-10-27 DIAGNOSIS — Y712 Prosthetic and other implants, materials and accessory cardiovascular devices associated with adverse incidents: Secondary | ICD-10-CM

## 2016-10-27 DIAGNOSIS — I480 Paroxysmal atrial fibrillation: Secondary | ICD-10-CM

## 2016-10-27 DIAGNOSIS — D649 Anemia, unspecified: Secondary | ICD-10-CM

## 2016-10-27 DIAGNOSIS — T827XXA Infection and inflammatory reaction due to other cardiac and vascular devices, implants and grafts, initial encounter: Principal | ICD-10-CM

## 2016-10-27 DIAGNOSIS — F320 Major depressive disorder, single episode, mild: Secondary | ICD-10-CM

## 2016-10-27 HISTORY — PX: TEE WITHOUT CARDIOVERSION: SHX5443

## 2016-10-27 LAB — IRON AND TIBC
IRON: 47 ug/dL (ref 28–170)
SATURATION RATIOS: 18 % (ref 10.4–31.8)
TIBC: 266 ug/dL (ref 250–450)
UIBC: 219 ug/dL

## 2016-10-27 LAB — CULTURE, BLOOD (ROUTINE X 2)

## 2016-10-27 LAB — BASIC METABOLIC PANEL
ANION GAP: 9 (ref 5–15)
BUN: 37 mg/dL — ABNORMAL HIGH (ref 6–20)
CALCIUM: 8.6 mg/dL — AB (ref 8.9–10.3)
CHLORIDE: 112 mmol/L — AB (ref 101–111)
CO2: 18 mmol/L — AB (ref 22–32)
Creatinine, Ser: 3.2 mg/dL — ABNORMAL HIGH (ref 0.44–1.00)
GFR calc Af Amer: 17 mL/min — ABNORMAL LOW (ref >60)
GFR calc non Af Amer: 14 mL/min — ABNORMAL LOW (ref >60)
GLUCOSE: 94 mg/dL (ref 65–99)
Potassium: 3.4 mmol/L — ABNORMAL LOW (ref 3.5–5.1)
Sodium: 139 mmol/L (ref 135–145)

## 2016-10-27 LAB — GLUCOSE, CAPILLARY
GLUCOSE-CAPILLARY: 99 mg/dL (ref 65–99)
GLUCOSE-CAPILLARY: 124 mg/dL — AB (ref 65–99)
Glucose-Capillary: 85 mg/dL (ref 65–99)

## 2016-10-27 LAB — FERRITIN: Ferritin: 118 ng/mL (ref 11–307)

## 2016-10-27 LAB — T4, FREE
FREE T4: 1.1 ng/dL (ref 0.61–1.12)
Free T4: 1.08 ng/dL (ref 0.61–1.12)

## 2016-10-27 LAB — RETICULOCYTES
RBC.: 3.76 MIL/uL — ABNORMAL LOW (ref 3.87–5.11)
Retic Count, Absolute: 101.5 10*3/uL (ref 19.0–186.0)
Retic Ct Pct: 2.7 % (ref 0.4–3.1)

## 2016-10-27 LAB — VITAMIN B12: VITAMIN B 12: 484 pg/mL (ref 180–914)

## 2016-10-27 LAB — TSH: TSH: 0.154 u[IU]/mL — ABNORMAL LOW (ref 0.350–4.500)

## 2016-10-27 LAB — FOLATE: Folate: 22.4 ng/mL (ref >5.9)

## 2016-10-27 SURGERY — ECHOCARDIOGRAM, TRANSESOPHAGEAL
Anesthesia: Moderate Sedation

## 2016-10-27 MED ORDER — FENTANYL CITRATE (PF) 100 MCG/2ML IJ SOLN
INTRAMUSCULAR | Status: AC
  Filled 2016-10-27: qty 2

## 2016-10-27 MED ORDER — HEPARIN SODIUM (PORCINE) 5000 UNIT/ML IJ SOLN
5000.0000 [IU] | Freq: Three times a day (TID) | INTRAMUSCULAR | Status: DC
  Administered 2016-10-28 – 2016-10-30 (×8): 5000 [IU] via SUBCUTANEOUS
  Filled 2016-10-27 (×7): qty 1

## 2016-10-27 MED ORDER — MIDAZOLAM HCL 5 MG/ML IJ SOLN
INTRAMUSCULAR | Status: AC
  Filled 2016-10-27: qty 1

## 2016-10-27 MED ORDER — FENTANYL CITRATE (PF) 100 MCG/2ML IJ SOLN
INTRAMUSCULAR | Status: DC | PRN
  Administered 2016-10-27: 25 ug via INTRAVENOUS

## 2016-10-27 MED ORDER — SODIUM CHLORIDE 0.9 % IV SOLN
INTRAVENOUS | Status: DC
  Administered 2016-10-27: 14:00:00 via INTRAVENOUS

## 2016-10-27 MED ORDER — MIDAZOLAM HCL 10 MG/2ML IJ SOLN
INTRAMUSCULAR | Status: DC | PRN
  Administered 2016-10-27 (×2): 2 mg via INTRAVENOUS

## 2016-10-27 MED ORDER — LEVOTHYROXINE SODIUM 88 MCG PO TABS
88.0000 ug | ORAL_TABLET | Freq: Every day | ORAL | Status: DC
Start: 2016-10-28 — End: 2016-10-30
  Administered 2016-10-28 – 2016-10-30 (×3): 88 ug via ORAL
  Filled 2016-10-27 (×4): qty 1

## 2016-10-27 MED ORDER — DIPHENHYDRAMINE HCL 50 MG/ML IJ SOLN
INTRAMUSCULAR | Status: AC
  Filled 2016-10-27: qty 1

## 2016-10-27 MED ORDER — BUTAMBEN-TETRACAINE-BENZOCAINE 2-2-14 % EX AERO
INHALATION_SPRAY | CUTANEOUS | Status: DC | PRN
  Administered 2016-10-27: 2 via TOPICAL

## 2016-10-27 NOTE — Progress Notes (Signed)
  Echocardiogram Echocardiogram Transesophageal has been performed.  Rachael [name] 10/27/2016, 3:41 PM

## 2016-10-27 NOTE — Care Management Important Message (Signed)
Important Message  Patient Details  Name: Rachael [name] MRN: [id number] Date of Birth: 1956/02/01   Medicare Important Message Given:  Yes    Kamie Korber [address] 10/27/2016, 12:50 PM

## 2016-10-27 NOTE — Progress Notes (Signed)
Pt back from TEE alert and orientated x4. Tolerated clears fine- denies any nausea and pain. No complaints voiced at this time.  Paulla Fore, RN

## 2016-10-27 NOTE — Progress Notes (Signed)
  [facility] Progress Note    Assessment/ Plan:    1.  Acute interstitial nephritis: Creatinine stable.  She does not require any more dialysis.  Continue prednisone 40 mg daily (started 1/30-).  PPIs listed as an allergy in Epic.  2.  Sepsis: Stah Epi, WBC ct, Lactate of 3.0 on admission. Cultures at [place] 2/13  ([facility]) showing S. Epidermidis R to clinda, erythro, oxacillin, penicillin.; here 2/15 + CONS with methicillin resistance.  Repeat cultures 2/16 + 2/17 pending.   TTE without overt vegetations.  ID now following, appreciate assistance from Dr. Tommy Medal.  Switching abx to dapto (2/18-). TEE scheduled later today  3.  HLD: atorvastatin  4. h/o CVA; on Plavix-- perhaps Afib is why she had her CVA in the first place  5.  Afib: per primary.  Cycling troponins; EKGs without overt ischemia.   Appreciate cardiology involvement. Cardiology to see today  Subjective:    Pt w/o complaint this AM.  Now on daptomycin   Objective:   BP (!) 105/49 (BP Location: Right Arm)   Pulse (!) 57   Temp 97.8 F (36.6 C) (Oral)   Resp 18   Ht 5\' 2"  (1.575 m)   Wt 99.6 kg (219 lb 9.3 oz)   SpO2 98%   BMI 40.16 kg/m   Intake/Output Summary (Last 24 hours) at 10/27/16 1032 Last data filed at 10/27/16 0617  Gross per 24 hour  Intake              240 ml  Output             2300 ml  Net            -2060 ml   Weight change: -0.146 kg (-5.2 oz)  Physical Exam: GEN: WDWN, lying in bed, NAD  HEENT EOMI, NCAT RRR PULM CTAB no c/w/r CV RRR this AM ABD sot, nontender, nondistended, NABS EXT no LE edema NEURO: AAO x 3, nonfocal  Imaging: No results found.  Labs: BMET  Recent Labs Lab 10/23/16 2055 10/24/16 0344 10/25/16 0651 10/26/16 0355 10/27/16 0441  NA 138 139 141 141 139  K 4.1 4.0 3.7 3.5 3.4*  CL 107 117* 117* 113* 112*  CO2 16* 12* 18* 18* 18*  GLUCOSE 231* 109* 93 112* 94  BUN 45* 42* 31* 38* 37*  CREATININE 3.51* 3.04* 2.99* 3.27* 3.20*  CALCIUM  9.1 7.4* 8.2* 8.4* 8.6*   CBC  Recent Labs Lab 10/23/16 2055 10/24/16 0550 10/25/16 0651 10/26/16 0355  WBC 20.0* 11.0* 7.9 9.7  NEUTROABS 18.9* 9.2* 6.3  --   HGB 11.1* 8.4* 8.6* 8.9*  HCT 34.1* 26.3* 26.7* 27.3*  MCV 81.6 81.7 82.2 81.0  PLT 214 125* 115* 134*    Medications:    . citalopram  20 mg Oral Daily  . clopidogrel  75 mg Oral Daily  . DAPTOmycin (CUBICIN)  IV  6 mg/kg Intravenous Q48H  . enoxaparin (LOVENOX) injection  30 mg Subcutaneous Q24H  . famotidine  20 mg Oral Daily  . insulin aspart  0-9 Units Subcutaneous TID WC  . levothyroxine  100 mcg Oral QAC breakfast  . metoprolol tartrate  25 mg Oral BID  . predniSONE  40 mg Oral Q breakfast      Pearson Grippe MD [phone] pgr 10/27/2016, 10:32 AM

## 2016-10-27 NOTE — CV Procedure (Addendum)
     Transesophageal Echocardiogram Note  Rachael [name] [id number] Jan 14, 1956  Procedure: Transesophageal Echocardiogram Indications: Bacteremia  Procedure Details Consent: Obtained Time Out: Verified patient identification, verified procedure, site/side was marked, verified correct patient position, special equipment/implants available, Radiology Safety Procedures followed,  medications/allergies/relevent history reviewed, required imaging and test results available.  Performed  Medications: Fentanyl: 25 mcg Versed: 4 mg  During this procedure the patient is administered a total of Versed 4 mg mg and Fentanyl 50 mcg mg to achieve and maintain moderate conscious sedation.  The patient's heart rate, blood pressure, and oxygen saturation are monitored continuously during the procedure. The period of conscious sedation is 30 minutes, of which I was present face-to-face 100% of this time.  Left Ventrical:  LVEF 60-65%  Mitral Valve: Mild MR, no vegetation  Aortic Valve: No vegetation  Tricuspid Valve: Mild TR, no vegetation  Pulmonic Valve: No vegetation  Left Atrium/ Left atrial appendage: No thrombus, normal velocities  Atrial septum: No ASD/PFO  Aorta: Mild non-mobile plaque.    Complications: No apparent complications Patient did tolerate procedure well.  Ena Dawley, MD, [facility] 10/27/2016, 3:27 PM

## 2016-10-27 NOTE — Consult Note (Signed)
Patient ID: Rachael [name] MRN: [id number], DOB/AGE: 63-Aug-1957   Admit date: 10/23/2016   Reason for Consult: Atrial Fibrillation ? Need for Long term Oral Anticoagulation Requesting MD: Dr. Allyson Sabal, Internal Medicine    Primary Physician: Ronita Hipps, MD Primary Cardiologist: New (Dr. Burt Knack)  Pt. Profile:  61 y/o female with h/o HTN, HLD, CKD, prior CVA and hypothyroidism. She was recently admitted for acute interstitial nephritis and discharged on 10/14/16. She was readmitted 10/24/16 for bacteremia with gram + cocci. Hospital course complicated by newly discovered paroxsymal atrial fibrillation. Cardiology consulted for recommendations regarding oral anticoagulation.    Problem List  Past Medical History:  Diagnosis Date  . CHF (congestive heart failure) ([place])    Heart cath in 2006  . CKD (chronic kidney disease)    Baseline creat 1.4-1.7  . CVA (cerebral vascular accident) ([place])    No residual deficit  . HTN (hypertension)   . Hypothyroidism     Past Surgical History:  Procedure Laterality Date  . CARDIAC CATHETERIZATION  2006  . CHOLECYSTECTOMY    . IR GENERIC HISTORICAL  09/29/2016   IR FLUORO GUIDE CV LINE RIGHT 09/29/2016 Jacqulynn Cadet, MD [facility]  . IR GENERIC HISTORICAL  09/29/2016   IR US GUIDE VASC ACCESS RIGHT [facility]     Allergies  Allergies  Allergen Reactions  . Proton Pump Inhibitors     Biopsy proven acute interstitial nephritis; required dialysis in 09/2016  . Penicillins Rash and Other (See Comments)    Tolerates rocephin  . Sulfa Antibiotics Rash    HPI  61 y/o female with h/o HTN, HLD, CKD, prior recurrent CVAs and hyperthyroidism. She was recently admitted for acute interstitial nephritis and discharged on 10/14/16, on prednisone. She presented back to the ED on 10/24/16, referred by her nephrologist, after recently drawn blood cultures grew gram-positive cocci in clusters. She was admitted by IM for bacteremia and placed on  antibiotics. Infectious disease is following and she is scheduled to undergo a TEE with Dr. Meda Coffee today at 1400 to r/o endocarditis.    This admission for bacteremia has also been complicated by paroxysmal atrial fibrillation and possible over supplementation with thyroid hormone replacement therapy. There is no prior h/o atrial fibrillation, that we are aware of. It is felt that her afib is likely secondary to her acute illness with bacteremia. Troponins are negative x 3. 2D echo 10/24/16 showed normal LVEF at 60-65% with normal wall motion. LV diastolic function parameters were normal.  Her left atrium is normal in size. TSH is low at 0.200. Free T4 pending. She is mildly hypokalemic with K of 3.4. SCr is 3.20. BUN 37. Currently afebrile with antibiotics. Currently on Daptomycin, IV. Her rate is controlled with metoprolol, 25 mg BID.  She is on Plavix given prior h/o TIA ~ 2 years ago w/o residual deficits. Her calculated CHA2DS2 VASc score is 4 for h/o CVA(2), HTN and female sex. Her risk for stroke is 4.8% per year. Of note, she also has anemia. Hgb has ranged from 8-11 over the last 2 months. Hgb today is 8.9.     Home Medications  Prior to Admission medications   Medication Sig Start Date End Date Taking? Authorizing Provider  atorvastatin (LIPITOR) 40 MG tablet Take 40 mg by mouth daily.    Yes Historical Provider, MD  citalopram (CELEXA) 40 MG tablet Take 40 mg by mouth daily.   Yes Historical Provider, MD  clopidogrel (PLAVIX) 75 MG tablet Take 75  mg by mouth daily.   Yes Historical Provider, MD  famotidine (PEPCID) 20 MG tablet Take 1 tablet (20 mg total) by mouth 2 (two) times daily. 10/14/16  Yes Verlee Monte, MD  levothyroxine (SYNTHROID, LEVOTHROID) 100 MCG tablet Take 100 mcg by mouth daily before breakfast.   Yes Historical Provider, MD  predniSONE (DELTASONE) 20 MG tablet Take 2 tablets (40 mg total) by mouth daily with breakfast. 10/15/16  Yes Verlee Monte, [place]  .  citalopram  20 mg Oral Daily  . clopidogrel  75 mg Oral Daily  . DAPTOmycin (CUBICIN)  IV  6 mg/kg Intravenous Q48H  . enoxaparin (LOVENOX) injection  30 mg Subcutaneous Q24H  . famotidine  20 mg Oral Daily  . insulin aspart  0-9 Units Subcutaneous TID WC  . levothyroxine  100 mcg Oral QAC breakfast  . metoprolol tartrate  25 mg Oral BID  . predniSONE  40 mg Oral Q breakfast    Family History  Family History  Problem Relation Age of Onset  . Hypertension Mother   . Stroke Mother   . Coronary artery disease Mother     Late onset  . Hypertension Father   . Coronary artery disease Father     Late onset  . Hypertension Sister   . Hypertension Brother     Social History  Social History   Social History  . Marital status: Single    Spouse name: N/A  . Number of children: N/A  . Years of education: N/A   Occupational History  . Not on file.   Social History Main Topics  . Smoking status: Never Smoker  . Smokeless tobacco: Never Used  . Alcohol use No  . Drug use: No  . Sexual activity: Not on file   Other Topics Concern  . Not on file   Social History Narrative  . No narrative on file     Review of Systems General:  No chills, fever, night sweats or weight changes.  Cardiovascular:  No chest pain, dyspnea on exertion, edema, orthopnea, palpitations, paroxysmal nocturnal dyspnea. Dermatological: No rash, lesions/masses Respiratory: No cough, dyspnea Urologic: No hematuria, dysuria Abdominal:   No nausea, vomiting, diarrhea, bright red blood per rectum, melena, or hematemesis Neurologic:  No visual changes, wkns, changes in mental status. All other systems reviewed and are otherwise negative except as noted above.  Physical Exam  Blood pressure 111/65, pulse (!) 55, temperature 97.8 F (36.6 C), temperature source Oral, resp. rate 18, height 5\' 2"  (1.575 m), weight 219 lb 9.3 oz (99.6 kg), SpO2 98 %.  General: Pleasant, NAD Psych: Normal affect. Neuro:  Alert and oriented X 3. Moves all extremities spontaneously. HEENT: Normal  Neck: Supple without bruits or JVD. Lungs:  Resp regular and unlabored, CTA. Heart: RRR no s3, s4, or murmurs. Abdomen: Soft, non-tender, non-distended, BS + x 4.  Extremities: No clubbing, cyanosis or edema. DP/PT/Radials 2+ and equal bilaterally.  Labs  Troponin (Point of Care Test) No results for input(s): TROPIPOC in the last 72 hours.  Recent Labs  10/26/16 0355 10/26/16 0935 10/26/16 1506  CKTOTAL  --  31*  --   TROPONINI <0.03 <0.03 <0.03   Lab Results  Component Value Date   WBC 9.7 10/26/2016   HGB 8.9 (L) 10/26/2016   HCT 27.3 (L) 10/26/2016   MCV 81.0 10/26/2016   PLT 134 (L) 10/26/2016    Recent Labs Lab 10/23/16 2055  10/27/16 0441  NA 138  < >  139  K 4.1  < > 3.4*  CL 107  < > 112*  CO2 16*  < > 18*  BUN 45*  < > 37*  CREATININE 3.51*  < > 3.20*  CALCIUM 9.1  < > 8.6*  PROT 7.1  --   --   BILITOT 0.9  --   --   ALKPHOS 49  --   --   ALT 21  --   --   AST 20  --   --   GLUCOSE 231*  < > 94  < > = values in this interval not displayed. No results found for: CHOL, HDL, LDLCALC, TRIG No results found for: DDIMER   Radiology/Studies  Dg Chest 2 View  Result Date: 10/23/2016 CLINICAL DATA:  Fever, chest pain, shortness of breath. EXAM: CHEST  2 VIEW COMPARISON:  Radiographs of September 25, 2016. FINDINGS: The heart size and mediastinal contours are within normal limits. Both lungs are clear. No pneumothorax or pleural effusion is noted. Eventration of the anterior portion of right hemidiaphragm is noted. Interval placement of right internal jugular catheter with distal tip at the expected position of cavoatrial junction. The visualized skeletal structures are unremarkable. IMPRESSION: No active cardiopulmonary disease. Electronically Signed   By: Marijo Conception, M.D.   On: 10/23/2016 21:21   US Abdomen Complete  Result Date: 10/04/2016 CLINICAL DATA:  Nausea and vomiting,  remote cholecystectomy, chronic renal disease EXAM: ABDOMEN ULTRASOUND COMPLETE COMPARISON:  09/19/2016, 09/21/2017 FINDINGS: Gallbladder: Surgically absent Common bile duct: Diameter: 2.4 mm, difficult to visualize because of bowel gas. Liver: Limited assessment of the left lobe secondary to bowel gas. Imaged portion of the liver demonstrates no definite focal abnormality or biliary dilatation. Patent portal vein with normal hepatopetal flow. IVC: Limited visualization because of body habitus Pancreas: Visualized portion unremarkable. Spleen: Size and appearance within normal limits. Right Kidney: Length: 8.8 cm. Cortical thinning. Normal echogenicity. No hydronephrosis. No significant focal abnormality Left Kidney: Length: 9.2 cm. Similar cortical thinning. No definite focal abnormality or hydronephrosis Abdominal aorta: No aneurysm visualized. Other findings: None. IMPRESSION: Limited because of body habitus and bowel gas. Remote cholecystectomy No definite acute intra-abdominal finding by ultrasound Electronically Signed   By: Jerilynn Mages.  Shick M.D.   On: 10/04/2016 10:06   US Biopsy  Result Date: 10/02/2016 CLINICAL DATA:  Acute on chronic kidney disease and need for random renal biopsy. EXAM: ULTRASOUND GUIDED CORE BIOPSY OF RIGHT KIDNEY MEDICATIONS: 2.0 mg IV Versed; 75 mcg IV Fentanyl Total Moderate Sedation Time: 15 minutes. The patient's level of consciousness and physiologic status were continuously monitored during the procedure by Radiology nursing. PROCEDURE: The procedure, risks, benefits, and alternatives were explained to the patient. Questions regarding the procedure were encouraged and answered. The patient understands and consents to the procedure. A time out was performed prior to initiating the procedure. The right flank region was prepped with chlorhexidine in a sterile fashion, and a sterile drape was applied covering the operative field. A sterile gown and sterile gloves were used for the  procedure. Local anesthesia was provided with 1% Lidocaine. Initial ultrasound was used to inspect both kidneys. The right was chosen for biopsy. Under ultrasound guidance, 2 separate 16 gauge core biopsy passes were made into lower pole cortex. Material was submitted in saline. Post biopsy imaging was performed with ultrasound. COMPLICATIONS: None. FINDINGS: Visualization of the right kidney was better by ultrasound. The left kidney also demonstrated more significant lower pole cortical thinning compared to the right.  Solid tissue was obtained with biopsy. IMPRESSION: Ultrasound-guided core biopsy performed of the right kidney at the level of lower pole cortex. Electronically Signed   By: Aletta Edouard M.D.   On: 10/02/2016 11:59   Ir Fluoro Guide Cv Line Right  Result Date: 09/29/2016 INDICATION: 61 year old female in need of hemodialysis access. EXAM: IR RIGHT FLOURO GUIDE CV LINE; IR ULTRASOUND GUIDANCE VASC ACCESS RIGHT MEDICATIONS: None ANESTHESIA/SEDATION: None FLUOROSCOPY TIME:  Fluoroscopy Time: 0 minutes 18 seconds (11 mGy). COMPLICATIONS: None immediate. PROCEDURE: Informed written consent was obtained from the patient after a thorough discussion of the procedural risks, benefits and alternatives. All questions were addressed. Maximal Sterile Barrier Technique was utilized including caps, mask, sterile gowns, sterile gloves, sterile drape, hand hygiene and skin antiseptic. A timeout was performed prior to the initiation of the procedure. The right internal jugular vein was interrogated with ultrasound and found to be widely patent. An image was obtained and stored for the medical record. Local anesthesia was attained by infiltration with 1% lidocaine. A small dermatotomy was made. Under real-time sonographic guidance, the vessel was punctured with an 18 gauge needle. An Amplatz wire was advanced into the right heart and into the inferior vena cava. The needle was removed over the wire and a 20 cm  Mahurkar non tunneled hemodialysis catheter with an additional central venous access port was advanced over the wire and position with the tip in the upper right atrium. The wire was removed. The catheter was secured to the skin with 0 Prolene suture. The catheter was found to work well and was flushed with heparinized saline. Sterile bandages were applied. The patient tolerated the procedure well. IMPRESSION: Successful placement of a 20 cm Mahurkar non tunneled hemodialysis catheter with additional central venous access port via a right internal jugular vein approach. The catheter tips are in the upper right atrium and ready for immediate use. Electronically Signed   By: Jacqulynn Cadet M.D.   On: 09/29/2016 15:30   Ir US Guide Vasc Access Right  Result Date: 09/29/2016 INDICATION: 61 year old female in need of hemodialysis access. EXAM: IR RIGHT FLOURO GUIDE CV LINE; IR ULTRASOUND GUIDANCE VASC ACCESS RIGHT MEDICATIONS: None ANESTHESIA/SEDATION: None FLUOROSCOPY TIME:  Fluoroscopy Time: 0 minutes 18 seconds (11 mGy). COMPLICATIONS: None immediate. PROCEDURE: Informed written consent was obtained from the patient after a thorough discussion of the procedural risks, benefits and alternatives. All questions were addressed. Maximal Sterile Barrier Technique was utilized including caps, mask, sterile gowns, sterile gloves, sterile drape, hand hygiene and skin antiseptic. A timeout was performed prior to the initiation of the procedure. The right internal jugular vein was interrogated with ultrasound and found to be widely patent. An image was obtained and stored for the medical record. Local anesthesia was attained by infiltration with 1% lidocaine. A small dermatotomy was made. Under real-time sonographic guidance, the vessel was punctured with an 18 gauge needle. An Amplatz wire was advanced into the right heart and into the inferior vena cava. The needle was removed over the wire and a 20 cm Mahurkar non  tunneled hemodialysis catheter with an additional central venous access port was advanced over the wire and position with the tip in the upper right atrium. The wire was removed. The catheter was secured to the skin with 0 Prolene suture. The catheter was found to work well and was flushed with heparinized saline. Sterile bandages were applied. The patient tolerated the procedure well. IMPRESSION: Successful placement of a 20 cm Mahurkar non tunneled hemodialysis  catheter with additional central venous access port via a right internal jugular vein approach. The catheter tips are in the upper right atrium and ready for immediate use. Electronically Signed   By: Jacqulynn Cadet M.D.   On: 09/29/2016 15:30    ECG  10/26/16 atrial fibrillation w/ RVR 127 bpm   Echocardiogram 10/24/16  Study Conclusions  - Left ventricle: The cavity size was normal. Wall thickness was   normal. Systolic function was normal. The estimated ejection   fraction was in the range of 60% to 65%. Wall motion was normal;   there were no regional wall motion abnormalities. Left   ventricular diastolic function parameters were normal. Left atrium:  The atrium was normal in size.    ASSESSMENT AND PLAN  Principal Problem:   Bacteremia Active Problems:   UTI (urinary tract infection)   HTN (hypertension)   Hypothyroidism   Chronic kidney disease (CKD), stage V ([facility])   History of CVA (cerebrovascular accident)   Depression   Hyperlipidemia   Hemodialysis catheter infection ([place])   Interstitial nephritis   1. Bacteremia: blood cultures + for gram positive cocci. Currently on IV antibiotics, Daptomycin, per IM. TEE scheduled today with Dr. Meda Coffee at 1400 to r/o endocarditis. She is afebrile.   2. Paroxsysmal Atrial Fibrillation: no prior h/o arrhthymias. Recent occurrence is in the setting of bacteremia and possible over supplementation of her levothyroxin. Her rate is currently controlled with PO metoprolol, 25 mg  BID and she is in NSR at present. Hopefully, her PAF  will resolve with continued treatment of her bacteremia and adjustment of her thyroid replacement. However her calculated CHA2DS2 VASc score is 4, for prior h/o recurrent CVAs, HTN and female sex, placing her at a 4.8% risk for stoke/yr. No etiology for her previous TIAs was found. Given her risk, and prior h/o unexplained recurrent TIAs, she would benefit from oral anticoagulation for secondary prevention of CVA. Given her CKD her options will be limited to Coumadin. However, she has anemia which needs further investigating. I would not start Coumadin until it is clear that she does not have anemia based on blood loss, and I would not start Coumadin until the records from [facility] are obtained and reviewed to completely understand the evaluation of neurologic events in the past. (SEE FURTHER COMMENTS BY Philo Kurtz BELOW). We also recommend trying to obtain previous records regarding past admissions for previous TIAs to better understand previous work-ups/ treatments.    3. Interstitial Nephritis: management per nephrology.   4. Hypothyroidism: on levothyroxine as an outpatient. Currently on 100 mcg/ day. TSH is low at 0.200. Free T4 pending. In the setting of atrial fibrillation, we recommend reducing the dose of her levothyroxine. We will lower to 88 mcg/day. We will also order a repeat TSH.  5. Anemia: ? Etiology. Possibly from [place]. Hgb has ranged from 8-11 over the last month. Currently 8.9. Will need to monitor closely if patient is placed on coumadin.      Signed, Lyda Jester, PA-C 10/27/2016, 10:53 AM  Patient seen and examined. I agree with the assessment and plan as detailed above. See also my additional thoughts below.   I have spoken at length with the patient. I have carefully reviewed all data available. Patient had some palpitations with her atrial fibrillation here in the hospital. She has had some palpitations in the past.  However, it is my understanding that atrial fibrillation has not been documented in the past. She has paroxysmal atrial  fibrillation. It is possible that the episode of atrial fibrillation documented this admission is partially related to thyroid replacement with a low TSH. In addition, her atrial fib could be related to the stress of her bacteremia. The patient has a high CHA2DS2 VASc score. In addition, she has reports of prior TIAs including at least one episode of decreased left sided weakness that was assessed fully in the hospital. The records suggest [facility]. The patient told me [facility]. In all of the notes that I have read from [place] outpatient visits, the specifics of the hospital evaluation were based on information from the patient. It appears that she received TPA on one occasion. There is no mention of a cerebral angiogram. In summary, considering all of the points above, I feel that anticoagulation is indicated. With her renal disease, I suspect that Coumadin would be the best choice (if nephrology agrees). However, I would not start Coumadin until  1)  It  is felt that anemia is not from blood loss,  And  2)  records of her hospitalization for treatment of an acute neurologic event are obtained and reviewed carefully so that we know everything about prior evaluations before instituting Coumadin.  Dola Argyle, MD, [facility] 10/27/2016 11:40 AM

## 2016-10-27 NOTE — Progress Notes (Addendum)
PROGRESS NOTE    Rachael [name]  OJJ:[id number] DOB: 04/19/1956 DOA: 10/23/2016 PCP: Ronita Hipps, MD   Brief Narrative:  61 y.o.femalewith medical history significant of CHF, chronic kidney disease, CVA without residual deficit, hypertension, hypothyroidism, hyperlipidemia who was recently discharged for acute interstitial nephritis on 10/14/2017 (on prednisone 40 mg a day) who is coming to the emergency department referred by her nephrologist after the patient's recently drawn blood cultures grew gram-positive cocci in clusters. The patient stated that she has been experiencing fevers, fatigue, night sweats and chills at home.  Assessment & Plan:   Principal Problem:   Bacteremia Active Problems:   UTI (urinary tract infection)   HTN (hypertension)   Hypothyroidism   Chronic kidney disease (CKD), stage V (HCC)   History of CVA (cerebrovascular accident)   Depression   Hyperlipidemia   Hemodialysis catheter infection ([place])   Interstitial nephritis   HD catheter associated  MR coagulase-negative staphylococcal bacteremia  - likely 2/2 HD cath ascess. Now d/c. Leukocytosis Down trending. No fevers. - Vanc and cefepime per pharmacy. 2/16 >>2/18 - Started on IV Daptomycin 2/17>>changed to daptomycin to avoid nephrotoxicity of vancomycin in this patient with reovering renal function from interstitial nephritis - TTE negative for vegetations - Cards Consulted for TEE 2/18. Which was negative , no vegetation , no PFO - ID and nephrology recs appreciated.  - Nephro-recs appreciated- No more HD. Cont prednisone.  - [facility]- drawn 10/21/16, MR coagulase negative staph X2 - B/c 2/15- Coag neg staph one bottle,  - B/c 2/16 >> - B/c 2/17 >>   Paroxysmal A. Fib.- Symptomatic, this a.m. No previous occurrence. Likely related to current bacteremia infection. Now NSR. Troponins 3 negative. TSH - normal. EKG revealing atrial fibrillation, rates- 127.  Patient denies history of irregular heart rhythm. History of TIA lasting a few hrs, ~2 years ago without residual deficit, on plavix. CHAD2Vasc Score- 4. Talked to pt about anticoagulation, risks and benefit.  May need anticoagulation after being worked up for anemia - TTE 2/6 1unremarkable TSH low ,check free t4  Anemia of chronic disease Baseline around 10, will check FOBT Likely due to ckd 'check FOBT   HTN (hypertension) Intermittently Elevated BP, no antihypertensives on home medication list. Cont  metoprolol 25 mg twice a day    Hypothyroidism- Continue levothyroxine 100 g by mouth daily. TSH low- 0.2. - F/u in outpt setting, after resolution of acute illness, Reduce synthroid to 75 mcg    Chronic kidney disease (CKD), stage V ([place])- 2/2 AIN,  -  On prednisone 40mg  daily, continue - Nephrology recs appreciated- kidney function improving, No HD required.    Hyperlipidemia Continue atorvastatin 40 mg by mouth daily.    History of CVA. Continue Plavix.    Depression Continue Celexa 40 mg by mouth daily.  DVT prophylaxis: Lovenox SQ. Code Status: Full code. Family Communication:  Disposition Plan:  TEE today Consults called: Nephrology Dr.Upton, ID.  Admission status: Inpatient/tele   Procedures: Echo 2/16- Left ventricle: The cavity size was normal. Wall thickness was   normal. Systolic function was normal. The estimated ejection   fraction was in the range of 60% to 65%. Wall motion was normal;   there were no regional wall motion abnormalities. Left    ventricular diastolic function parameters were normal.  Antimicrobials:   Vanc 2/16 >>2/18  ceftriazone 2/15 >>2/18  Daptomycin 2/18>>  Subjective: Resting comfortably in bed, denies any joint pain, shortness of breath, chest  pain  Objective: Vitals:   10/26/16 0332 10/26/16 0618 10/26/16 2056 10/27/16 0521  BP: (!) 161/80 (!) 153/65 (!) 142/91 (!) 106/50  Pulse: 80 84 75 (!) 56  Resp: 18 19 20  16   Temp: 98.1 F (36.7 C) 98.4 F (36.9 C) 98.6 F (37 C) 98.2 F (36.8 C)  TempSrc: Oral Oral Oral Oral  SpO2: 100% 100% 96% 98%  Weight:   99.6 kg (219 lb 9.3 oz)   Height:        Intake/Output Summary (Last 24 hours) at 10/27/16 0849 Last data filed at 10/27/16 0617  Gross per 24 hour  Intake              240 ml  Output             2300 ml  Net            -2060 ml   Filed Weights   10/24/16 2101 10/25/16 2048 10/26/16 2056  Weight: 100.5 kg (221 lb 9.6 oz) 99.7 kg (219 lb 14.4 oz) 99.6 kg (219 lb 9.3 oz)    Examination:  General exam: Appears calm and comfortable  Respiratory system: Clear to auscultation. Respiratory effort normal. Cardiovascular system: S1 & S2 heard, RRR. 2/6 systolic murmur heard in aortic area. Denies ever told she had a murmur. Gastrointestinal system: Abdomen is nondistended, soft and nontender. No organomegaly or masses felt. Normal bowel sounds heard. Central nervous system: Alert and oriented. No focal neurological deficits. Extremities: Symmetric 5 x 5 power. Skin: No rashes, lesions or ulcers Psychiatry: Judgement and insight appear normal. Mood & affect appropriate.   Data Reviewed: I have personally reviewed following labs and imaging studies  CBC:  Recent Labs Lab 10/23/16 2055 10/24/16 0550 10/25/16 0651 10/26/16 0355  WBC 20.0* 11.0* 7.9 9.7  NEUTROABS 18.9* 9.2* 6.3  --   HGB 11.1* 8.4* 8.6* 8.9*  HCT 34.1* 26.3* 26.7* 27.3*  MCV 81.6 81.7 82.2 81.0  PLT 214 125* 115* 269*   Basic Metabolic Panel:  Recent Labs Lab 10/23/16 2055 10/24/16 0344 10/25/16 0651 10/26/16 0355 10/27/16 0441  NA 138 139 141 141 139  K 4.1 4.0 3.7 3.5 3.4*  CL 107 117* 117* 113* 112*  CO2 16* 12* 18* 18* 18*  GLUCOSE 231* 109* 93 112* 94  BUN 45* 42* 31* 38* 37*  CREATININE 3.51* 3.04* 2.99* 3.27* 3.20*  CALCIUM 9.1 7.4* 8.2* 8.4* 8.6*   Liver Function Tests:  Recent Labs Lab 10/23/16 2055  AST 20  ALT 21  ALKPHOS 49  BILITOT  0.9  PROT 7.1  ALBUMIN 3.8   Coagulation Profile:  Recent Labs Lab 10/23/16 2055  INR 1.07   CBG:  Recent Labs Lab 10/26/16 0807 10/26/16 1213 10/26/16 1616 10/26/16 2047 10/27/16 0745  GLUCAP 90 118* 159* 136* 85   Sepsis Labs:  Recent Labs Lab 10/23/16 2111 10/24/16 0344  LATICACIDVEN 3.02* 1.2    Recent Results (from the past 240 hour(s))  Culture, blood (Routine x 2)     Status: Abnormal (Preliminary result)   Collection Time: 10/23/16  9:00 PM  Result Value Ref Range Status   Specimen Description BLOOD LEFT ANTECUBITAL  Final   Special Requests BOTTLES DRAWN AEROBIC AND ANAEROBIC 5CC  Final   Culture  Setup Time   Final    GRAM POSITIVE COCCI IN CLUSTERS IN BOTH AEROBIC AND ANAEROBIC BOTTLES CRITICAL VALUE NOTED.  VALUE IS CONSISTENT WITH PREVIOUSLY REPORTED AND CALLED VALUE.    Culture (A)  Final    STAPHYLOCOCCUS SPECIES (COAGULASE NEGATIVE) SUSCEPTIBILITIES PERFORMED ON PREVIOUS CULTURE WITHIN THE LAST 5 DAYS.    Report Status PENDING  Incomplete  Culture, blood (Routine x 2)     Status: Abnormal   Collection Time: 10/23/16  9:20 PM  Result Value Ref Range Status   Specimen Description BLOOD RIGHT ANTECUBITAL  Final   Special Requests BOTTLES DRAWN AEROBIC AND ANAEROBIC 5CC  Final   Culture  Setup Time   Final    GRAM POSITIVE COCCI IN CLUSTERS IN BOTH AEROBIC AND ANAEROBIC BOTTLES CRITICAL RESULT CALLED TO, READ BACK BY AND VERIFIED WITH: G ABBOTT PHARMD 2331 10/24/16 A BROWNING    Culture STAPHYLOCOCCUS SPECIES (COAGULASE NEGATIVE) (A)  Final   Report Status 10/26/2016 FINAL  Final   Organism ID, Bacteria STAPHYLOCOCCUS SPECIES (COAGULASE NEGATIVE)  Final      Susceptibility   Staphylococcus species (coagulase negative) - MIC*    CIPROFLOXACIN <=0.5 SENSITIVE Sensitive     ERYTHROMYCIN >=8 RESISTANT Resistant     GENTAMICIN <=0.5 SENSITIVE Sensitive     OXACILLIN >=4 RESISTANT Resistant     TETRACYCLINE 2 SENSITIVE Sensitive     VANCOMYCIN 2  SENSITIVE Sensitive     TRIMETH/SULFA <=10 SENSITIVE Sensitive     CLINDAMYCIN >=8 RESISTANT Resistant     RIFAMPIN <=0.5 SENSITIVE Sensitive     Inducible Clindamycin NEGATIVE Sensitive     * STAPHYLOCOCCUS SPECIES (COAGULASE NEGATIVE)  Blood Culture ID Panel (Reflexed)     Status: Abnormal   Collection Time: 10/23/16  9:20 PM  Result Value Ref Range Status   Enterococcus species NOT DETECTED NOT DETECTED Final   Listeria monocytogenes NOT DETECTED NOT DETECTED Final   Staphylococcus species DETECTED (A) NOT DETECTED Final    Comment: Methicillin (oxacillin) resistant coagulase negative staphylococcus. Possible blood culture contaminant (unless isolated from more than one blood culture draw or clinical case suggests pathogenicity). No antibiotic treatment is indicated for blood  culture contaminants. CRITICAL RESULT CALLED TO, READ BACK BY AND VERIFIED WITH: G ABBOTT PHARMD 2331 10/24/16 A BROWNING    Staphylococcus aureus NOT DETECTED NOT DETECTED Final   Methicillin resistance DETECTED (A) NOT DETECTED Final    Comment: CRITICAL RESULT CALLED TO, READ BACK BY AND VERIFIED WITH: G ABBOTT PHARMD 2331 10/24/16 A BROWNING    Streptococcus species NOT DETECTED NOT DETECTED Final   Streptococcus agalactiae NOT DETECTED NOT DETECTED Final   Streptococcus pneumoniae NOT DETECTED NOT DETECTED Final   Streptococcus pyogenes NOT DETECTED NOT DETECTED Final   Acinetobacter baumannii NOT DETECTED NOT DETECTED Final   Enterobacteriaceae species NOT DETECTED NOT DETECTED Final   Enterobacter cloacae complex NOT DETECTED NOT DETECTED Final   Escherichia coli NOT DETECTED NOT DETECTED Final   Klebsiella oxytoca NOT DETECTED NOT DETECTED Final   Klebsiella pneumoniae NOT DETECTED NOT DETECTED Final   Proteus species NOT DETECTED NOT DETECTED Final   Serratia marcescens NOT DETECTED NOT DETECTED Final   Haemophilus influenzae NOT DETECTED NOT DETECTED Final   Neisseria meningitidis NOT DETECTED NOT  DETECTED Final   Pseudomonas aeruginosa NOT DETECTED NOT DETECTED Final   Candida albicans NOT DETECTED NOT DETECTED Final   Candida glabrata NOT DETECTED NOT DETECTED Final   Candida krusei NOT DETECTED NOT DETECTED Final   Candida parapsilosis NOT DETECTED NOT DETECTED Final   Candida tropicalis NOT DETECTED NOT DETECTED Final  Culture, blood (Routine X 2) w Reflex to ID Panel     Status: None (Preliminary result)   Collection Time:  10/24/16  6:13 PM  Result Value Ref Range Status   Specimen Description BLOOD RIGHT ANTECUBITAL  Final   Special Requests BOTTLES DRAWN AEROBIC AND ANAEROBIC 5CC EACH  Final   Culture NO GROWTH 2 DAYS  Final   Report Status PENDING  Incomplete  Culture, blood (Routine X 2) w Reflex to ID Panel     Status: None (Preliminary result)   Collection Time: 10/24/16  6:13 PM  Result Value Ref Range Status   Specimen Description BLOOD LEFT ANTECUBITAL  Final   Special Requests BOTTLES DRAWN AEROBIC ONLY 8CC  Final   Culture NO GROWTH 2 DAYS  Final   Report Status PENDING  Incomplete  Culture, blood (Routine X 2) w Reflex to ID Panel     Status: None (Preliminary result)   Collection Time: 10/25/16 11:10 AM  Result Value Ref Range Status   Specimen Description BLOOD RIGHT ANTECUBITAL  Final   Special Requests BOTTLES DRAWN AEROBIC ONLY  5CC  Final   Culture NO GROWTH < 24 HOURS  Final   Report Status PENDING  Incomplete  Culture, blood (Routine X 2) w Reflex to ID Panel     Status: None (Preliminary result)   Collection Time: 10/25/16 11:15 AM  Result Value Ref Range Status   Specimen Description BLOOD LEFT ARM  Final   Special Requests BOTTLES DRAWN AEROBIC ONLY  5CC  Final   Culture NO GROWTH < 24 HOURS  Final   Report Status PENDING  Incomplete     Radiology Studies: No results found. Scheduled Meds: . citalopram  20 mg Oral Daily  . clopidogrel  75 mg Oral Daily  . DAPTOmycin (CUBICIN)  IV  6 mg/kg Intravenous Q48H  . enoxaparin (LOVENOX) injection   30 mg Subcutaneous Q24H  . famotidine  20 mg Oral Daily  . insulin aspart  0-9 Units Subcutaneous TID WC  . levothyroxine  100 mcg Oral QAC breakfast  . metoprolol tartrate  25 mg Oral BID  . predniSONE  40 mg Oral Q breakfast   Continuous Infusions:   LOS: 4 days    Reyne Dumas, MD [facility] Pager [phone]  If 7PM-7AM, please contact night-coverage [email or web address] Password [facility] 10/27/2016, 8:49 AM

## 2016-10-27 NOTE — H&P (View-Only) (Signed)
Subjective: No new complaints    Antibiotics:  Anti-infectives    Start     Dose/Rate Route Frequency Ordered Stop   10/26/16 1800  DAPTOmycin (CUBICIN) 598 mg in sodium chloride 0.9 % IVPB     6 mg/kg  99.7 kg 223.9 mL/hr over 30 Minutes Intravenous Every 48 hours 10/26/16 1039     10/25/16 2000  vancomycin (VANCOCIN) IVPB 1000 mg/200 mL premix  Status:  Discontinued     1,000 mg 200 mL/hr over 60 Minutes Intravenous Every 48 hours 10/24/16 1210 10/26/16 1022   10/24/16 2200  ceFEPIme (MAXIPIME) 1 g in dextrose 5 % 50 mL IVPB  Status:  Discontinued     1 g 100 mL/hr over 30 Minutes Intravenous Every 24 hours 10/23/16 2149 10/24/16 1618   10/24/16 1800  cefTRIAXone (ROCEPHIN) 2 g in dextrose 5 % 50 mL IVPB  Status:  Discontinued     2 g 100 mL/hr over 30 Minutes Intravenous Every 24 hours 10/24/16 1626 10/26/16 1022   10/23/16 2145  levofloxacin (LEVAQUIN) IVPB 750 mg  Status:  Discontinued     750 mg 100 mL/hr over 90 Minutes Intravenous  Once 10/23/16 2137 10/23/16 2141   10/23/16 2145  aztreonam (AZACTAM) 2 g in dextrose 5 % 50 mL IVPB  Status:  Discontinued     2 g 100 mL/hr over 30 Minutes Intravenous  Once 10/23/16 2137 10/23/16 2141   10/23/16 2145  vancomycin (VANCOCIN) IVPB 1000 mg/200 mL premix  Status:  Discontinued     1,000 mg 200 mL/hr over 60 Minutes Intravenous  Once 10/23/16 2137 10/23/16 2141   10/23/16 2145  vancomycin (VANCOCIN) 2,000 mg in sodium chloride 0.9 % 500 mL IVPB     2,000 mg 250 mL/hr over 120 Minutes Intravenous  Once 10/23/16 2141 10/24/16 0002   10/23/16 2145  ceFEPIme (MAXIPIME) 2 g in dextrose 5 % 50 mL IVPB     2 g 100 mL/hr over 30 Minutes Intravenous  Once 10/23/16 2141 10/23/16 2217      Medications: Scheduled Meds: . citalopram  20 mg Oral Daily  . clopidogrel  75 mg Oral Daily  . DAPTOmycin (CUBICIN)  IV  6 mg/kg Intravenous Q48H  . enoxaparin (LOVENOX) injection  30 mg Subcutaneous Q24H  . famotidine  20 mg Oral  Daily  . insulin aspart  0-9 Units Subcutaneous TID WC  . levothyroxine  100 mcg Oral QAC breakfast  . predniSONE  40 mg Oral Q breakfast   Continuous Infusions: PRN Meds:.    Objective: Weight change: -1 lb 11.2 oz (-0.771 kg)  Intake/Output Summary (Last 24 hours) at 10/26/16 1302 Last data filed at 10/26/16 0086  Gross per 24 hour  Intake              610 ml  Output             1150 ml  Net             -540 ml   Blood pressure (!) 153/65, pulse 84, temperature 98.4 F (36.9 C), temperature source Oral, resp. rate 19, height 5\' 2"  (1.575 m), weight 219 lb 14.4 oz (99.7 kg), SpO2 100 %. Temp:  [98.1 F (36.7 C)-98.4 F (36.9 C)] 98.4 F (36.9 C) (02/18 0618) Pulse Rate:  [70-84] 84 (02/18 0618) Resp:  [18-19] 19 (02/18 0618) BP: (123-161)/(65-80) 153/65 (02/18 0618) SpO2:  [98 %-100 %] 100 % (02/18 0618) Weight:  [219 lb  14.4 oz (99.7 kg)] 219 lb 14.4 oz (99.7 kg) (02/17 2048)  Physical Exam: General: Alert and awake, oriented x3, not in any acute distress. HEENT: anicteric sclera,  EOMI, oropharynx clear and without exudate Cardiovascular: regular rate, normal r,  no murmur rubs or gallops Pulmonary: clear to auscultation bilaterally, no wheezing, rales or rhonchi Gastrointestinal: soft nontender, nondistended, normal bowel sounds, Musculoskeletal: no  clubbing or edema noted bilaterally Skin, bandage in place were HD catheter was  Neuro: nonfocal, strength and sensation intact   CBC: CBC Latest Ref Rng & Units 10/26/2016 10/25/2016 10/24/2016  WBC 4.0 - 10.5 K/uL 9.7 7.9 11.0(H)  Hemoglobin 12.0 - 15.0 g/dL 8.9(L) 8.6(L) 8.4(L)  Hematocrit 36.0 - 46.0 % 27.3(L) 26.7(L) 26.3(L)  Platelets 150 - 400 K/uL 134(L) 115(L) 125(L)     BMET  Recent Labs  10/25/16 0651 10/26/16 0355  NA 141 141  K 3.7 3.5  CL 117* 113*  CO2 18* 18*  GLUCOSE 93 112*  BUN 31* 38*  CREATININE 2.99* 3.27*  CALCIUM 8.2* 8.4*     Liver Panel   Recent Labs  10/23/16 2055    PROT 7.1  ALBUMIN 3.8  AST 20  ALT 21  ALKPHOS 49  BILITOT 0.9       Sedimentation Rate No results for input(s): ESRSEDRATE in the last 72 hours. C-Reactive Protein No results for input(s): CRP in the last 72 hours.  Micro Results: Recent Results (from the past 720 hour(s))  Culture, blood (Routine x 2)     Status: Abnormal (Preliminary result)   Collection Time: 10/23/16  9:00 PM  Result Value Ref Range Status   Specimen Description BLOOD LEFT ANTECUBITAL  Final   Special Requests BOTTLES DRAWN AEROBIC AND ANAEROBIC 5CC  Final   Culture  Setup Time   Final    GRAM POSITIVE COCCI IN CLUSTERS IN BOTH AEROBIC AND ANAEROBIC BOTTLES CRITICAL VALUE NOTED.  VALUE IS CONSISTENT WITH PREVIOUSLY REPORTED AND CALLED VALUE.    Culture (A)  Final    STAPHYLOCOCCUS SPECIES (COAGULASE NEGATIVE) SUSCEPTIBILITIES PERFORMED ON PREVIOUS CULTURE WITHIN THE LAST 5 DAYS.    Report Status PENDING  Incomplete  Culture, blood (Routine x 2)     Status: Abnormal   Collection Time: 10/23/16  9:20 PM  Result Value Ref Range Status   Specimen Description BLOOD RIGHT ANTECUBITAL  Final   Special Requests BOTTLES DRAWN AEROBIC AND ANAEROBIC 5CC  Final   Culture  Setup Time   Final    GRAM POSITIVE COCCI IN CLUSTERS IN BOTH AEROBIC AND ANAEROBIC BOTTLES CRITICAL RESULT CALLED TO, READ BACK BY AND VERIFIED WITH: G ABBOTT PHARMD 2331 10/24/16 A BROWNING    Culture STAPHYLOCOCCUS SPECIES (COAGULASE NEGATIVE) (A)  Final   Report Status 10/26/2016 FINAL  Final   Organism ID, Bacteria STAPHYLOCOCCUS SPECIES (COAGULASE NEGATIVE)  Final      Susceptibility   Staphylococcus species (coagulase negative) - MIC*    CIPROFLOXACIN <=0.5 SENSITIVE Sensitive     ERYTHROMYCIN >=8 RESISTANT Resistant     GENTAMICIN <=0.5 SENSITIVE Sensitive     OXACILLIN >=4 RESISTANT Resistant     TETRACYCLINE 2 SENSITIVE Sensitive     VANCOMYCIN 2 SENSITIVE Sensitive     TRIMETH/SULFA <=10 SENSITIVE Sensitive     CLINDAMYCIN  >=8 RESISTANT Resistant     RIFAMPIN <=0.5 SENSITIVE Sensitive     Inducible Clindamycin NEGATIVE Sensitive     * STAPHYLOCOCCUS SPECIES (COAGULASE NEGATIVE)  Blood Culture ID Panel (Reflexed)  Status: Abnormal   Collection Time: 10/23/16  9:20 PM  Result Value Ref Range Status   Enterococcus species NOT DETECTED NOT DETECTED Final   Listeria monocytogenes NOT DETECTED NOT DETECTED Final   Staphylococcus species DETECTED (A) NOT DETECTED Final    Comment: Methicillin (oxacillin) resistant coagulase negative staphylococcus. Possible blood culture contaminant (unless isolated from more than one blood culture draw or clinical case suggests pathogenicity). No antibiotic treatment is indicated for blood  culture contaminants. CRITICAL RESULT CALLED TO, READ BACK BY AND VERIFIED WITH: G ABBOTT PHARMD 2331 10/24/16 A BROWNING    Staphylococcus aureus NOT DETECTED NOT DETECTED Final   Methicillin resistance DETECTED (A) NOT DETECTED Final    Comment: CRITICAL RESULT CALLED TO, READ BACK BY AND VERIFIED WITH: G ABBOTT PHARMD 2331 10/24/16 A BROWNING    Streptococcus species NOT DETECTED NOT DETECTED Final   Streptococcus agalactiae NOT DETECTED NOT DETECTED Final   Streptococcus pneumoniae NOT DETECTED NOT DETECTED Final   Streptococcus pyogenes NOT DETECTED NOT DETECTED Final   Acinetobacter baumannii NOT DETECTED NOT DETECTED Final   Enterobacteriaceae species NOT DETECTED NOT DETECTED Final   Enterobacter cloacae complex NOT DETECTED NOT DETECTED Final   Escherichia coli NOT DETECTED NOT DETECTED Final   Klebsiella oxytoca NOT DETECTED NOT DETECTED Final   Klebsiella pneumoniae NOT DETECTED NOT DETECTED Final   Proteus species NOT DETECTED NOT DETECTED Final   Serratia marcescens NOT DETECTED NOT DETECTED Final   Haemophilus influenzae NOT DETECTED NOT DETECTED Final   Neisseria meningitidis NOT DETECTED NOT DETECTED Final   Pseudomonas aeruginosa NOT DETECTED NOT DETECTED Final    Candida albicans NOT DETECTED NOT DETECTED Final   Candida glabrata NOT DETECTED NOT DETECTED Final   Candida krusei NOT DETECTED NOT DETECTED Final   Candida parapsilosis NOT DETECTED NOT DETECTED Final   Candida tropicalis NOT DETECTED NOT DETECTED Final  Culture, blood (Routine X 2) w Reflex to ID Panel     Status: None (Preliminary result)   Collection Time: 10/24/16  6:13 PM  Result Value Ref Range Status   Specimen Description BLOOD RIGHT ANTECUBITAL  Final   Special Requests BOTTLES DRAWN AEROBIC AND ANAEROBIC 5CC EACH  Final   Culture NO GROWTH 2 DAYS  Final   Report Status PENDING  Incomplete  Culture, blood (Routine X 2) w Reflex to ID Panel     Status: None (Preliminary result)   Collection Time: 10/24/16  6:13 PM  Result Value Ref Range Status   Specimen Description BLOOD LEFT ANTECUBITAL  Final   Special Requests BOTTLES DRAWN AEROBIC ONLY 8CC  Final   Culture NO GROWTH 2 DAYS  Final   Report Status PENDING  Incomplete  Culture, blood (Routine X 2) w Reflex to ID Panel     Status: None (Preliminary result)   Collection Time: 10/25/16 11:10 AM  Result Value Ref Range Status   Specimen Description BLOOD RIGHT ANTECUBITAL  Final   Special Requests BOTTLES DRAWN AEROBIC ONLY  5CC  Final   Culture NO GROWTH < 24 HOURS  Final   Report Status PENDING  Incomplete  Culture, blood (Routine X 2) w Reflex to ID Panel     Status: None (Preliminary result)   Collection Time: 10/25/16 11:15 AM  Result Value Ref Range Status   Specimen Description BLOOD LEFT ARM  Final   Special Requests BOTTLES DRAWN AEROBIC ONLY  5CC  Final   Culture NO GROWTH < 24 HOURS  Final   Report Status PENDING  Incomplete    Studies/Results: No results found.    Assessment/Plan:  INTERVAL HISTORY:  Dr. Hollie Salk obtained cultures from [facility] and the patient DID grow MR CO AG NEG STAPH from blood cultures  See below:       Principal Problem:   Gram-positive bacteremia Active  Problems:   UTI (urinary tract infection)   HTN (hypertension)   Hypothyroidism   Chronic kidney disease (CKD), stage V (HCC)   History of CVA (cerebrovascular accident)   Depression   Hyperlipidemia   Hemodialysis catheter infection (HCC)    Rachael [name] is a 61 y.o. female with Coagulase-negative staphylococcal bacteremia due to HD catheter  #1 HD catheter associated  MR coagulase-negative staphylococcal bacteremia    GREATLY appreciate Dr. Bishop Dublin help with tracking down these cultures  She appears to finally be clearing her bacteremia  I would still favor TEE to exclude endocarditis  I am changing to daptomycin to avoid nephrotoxicity of vancomycin in this patient with reovering renal function from interstitial nephritis.  Dr. Graylon Good to take over the case tomorrow.   LOS: 3 days   Alcide Evener 10/26/2016, 1:02 PM

## 2016-10-27 NOTE — Progress Notes (Signed)
[facility]    Date of Admission:  10/23/2016   Total days of antibiotics 5        Day 2 dapto           ID: Rachael [name] is a 60 y.o. female with MRSE bacteremia related to HD catheter now removed Principal Problem:   Bacteremia Active Problems:   UTI (urinary tract infection)   HTN (hypertension)   Hypothyroidism   Chronic kidney disease (CKD), stage V (HCC)   History of CVA (cerebrovascular accident)   Depression   Hyperlipidemia   Hemodialysis catheter infection ([place])   Interstitial nephritis    Subjective: Afebrile. She underwent TEE this afternoon which ruled out endocarditis  Ros: no fever, chills, nightsweats, diarrhea.  Medications:  . citalopram  20 mg Oral Daily  . clopidogrel  75 mg Oral Daily  . DAPTOmycin (CUBICIN)  IV  6 mg/kg Intravenous Q48H  . enoxaparin (LOVENOX) injection  30 mg Subcutaneous Q24H  . famotidine  20 mg Oral Daily  . insulin aspart  0-9 Units Subcutaneous TID WC  . [START ON 10/28/2016] levothyroxine  88 mcg Oral QAC breakfast  . metoprolol tartrate  25 mg Oral BID  . predniSONE  40 mg Oral Q breakfast    Objective: Vital signs in last 24 hours: Temp:  [97.8 F (36.6 C)-98.6 F (37 C)] 98.3 F (36.8 C) (02/19 1534) Pulse Rate:  [55-75] 58 (02/19 1556) Resp:  [11-20] 13 (02/19 1556) BP: (105-164)/(43-91) 105/69 (02/19 1556) SpO2:  [95 %-100 %] 95 % (02/19 1556) Weight:  [219 lb (99.3 kg)-219 lb 9.3 oz (99.6 kg)] 219 lb (99.3 kg) (02/19 1403) Physical Exam  Constitutional:  oriented to person, place, and time. appears well-developed and well-nourished. No distress.  HENT: [place]/AT, PERRLA, no scleral icterus Mouth/Throat: Oropharynx is clear and moist. No oropharyngeal exudate.  Cardiovascular: Normal rate, regular rhythm and normal heart sounds. Exam reveals no gallop and no friction rub.  No murmur heard.  Pulmonary/Chest: Effort normal and breath sounds normal. No respiratory distress.  has no  wheezes.  Neck = supple, no nuchal rigidity Abdominal: Soft. Bowel sounds are normal.  exhibits no distension. There is no tenderness.  Lymphadenopathy: no cervical adenopathy. No axillary adenopathy Neurological: alert and oriented to person, place, and time.  Skin: Skin is warm and dry. No rash noted. No erythema.  Psychiatric: a normal mood and affect.  behavior is normal.   Lab Results  Recent Labs  10/25/16 0651 10/26/16 0355 10/27/16 0441  WBC 7.9 9.7  --   HGB 8.6* 8.9*  --   HCT 26.7* 27.3*  --   NA 141 141 139  K 3.7 3.5 3.4*  CL 117* 113* 112*  CO2 18* 18* 18*  BUN 31* 38* 37*  CREATININE 2.99* 3.27* 3.20*    Microbiology: 2/17 blood cx ngtd 2/16 blood cx ngtd 2/15 staph species R to oxcacillin Studies/Results: No results found.   Assessment/Plan: Presumed MRSE bacteremia = documented cleared on 2/16. curently on day 4 of 14 of abtx regimen. Currently on daptomycin QOD.She is due for her next dose tomorrow.  I wonder if we could arrange for her to get daptomycin through infusion center every other day. instead of placing new central line. Will need to check if they can place peripheral IV vs getting temp line to do remaining abtx regimen  Ziare Cryder, [facility] Cell: [phone] Pager: [phone]  10/27/2016, 4:31 PM

## 2016-10-27 NOTE — Interval H&P Note (Signed)
History and Physical Interval Note:  10/27/2016 3:00 PM  Rachael [name]  has presented today for surgery, with the diagnosis of bacteremia  The various methods of treatment have been discussed with the patient and family. After consideration of risks, benefits and other options for treatment, the patient has consented to  Procedure(s): TRANSESOPHAGEAL ECHOCARDIOGRAM (TEE) (N/A) as a surgical intervention .  The patient's history has been reviewed, patient examined, no change in status, stable for surgery.  I have reviewed the patient's chart and labs.  Questions were answered to the patient's satisfaction.     Ena Dawley

## 2016-10-28 DIAGNOSIS — N185 Chronic kidney disease, stage 5: Secondary | ICD-10-CM

## 2016-10-28 DIAGNOSIS — Z8673 Personal history of transient ischemic attack (TIA), and cerebral infarction without residual deficits: Secondary | ICD-10-CM

## 2016-10-28 LAB — GLUCOSE, CAPILLARY
GLUCOSE-CAPILLARY: 131 mg/dL — AB (ref 65–99)
Glucose-Capillary: 90 mg/dL (ref 65–99)
Glucose-Capillary: 122 mg/dL — ABNORMAL HIGH (ref 65–99)
Glucose-Capillary: 159 mg/dL — ABNORMAL HIGH (ref 65–99)

## 2016-10-28 LAB — CBC
HCT: 27.6 % — ABNORMAL LOW (ref 36.0–46.0)
Hemoglobin: 8.9 g/dL — ABNORMAL LOW (ref 12.0–15.0)
MCH: 26.3 pg (ref 26.0–34.0)
MCHC: 32.2 g/dL (ref 30.0–36.0)
MCV: 81.4 fL (ref 78.0–100.0)
Platelets: 141 10*3/uL — ABNORMAL LOW (ref 150–400)
RBC: 3.39 MIL/uL — ABNORMAL LOW (ref 3.87–5.11)
RDW: 17.7 % — ABNORMAL HIGH (ref 11.5–15.5)
WBC: 10 10*3/uL (ref 4.0–10.5)

## 2016-10-28 LAB — COMPREHENSIVE METABOLIC PANEL
ALT: 13 U/L — ABNORMAL LOW (ref 14–54)
AST: 11 U/L — ABNORMAL LOW (ref 15–41)
Albumin: 3 g/dL — ABNORMAL LOW (ref 3.5–5.0)
Alkaline Phosphatase: 33 U/L — ABNORMAL LOW (ref 38–126)
Anion gap: 9 (ref 5–15)
BUN: 47 mg/dL — ABNORMAL HIGH (ref 6–20)
CO2: 18 mmol/L — ABNORMAL LOW (ref 22–32)
Calcium: 8.7 mg/dL — ABNORMAL LOW (ref 8.9–10.3)
Chloride: 112 mmol/L — ABNORMAL HIGH (ref 101–111)
Creatinine, Ser: 3.18 mg/dL — ABNORMAL HIGH (ref 0.44–1.00)
GFR calc Af Amer: 17 mL/min — ABNORMAL LOW (ref >60)
GFR calc non Af Amer: 15 mL/min — ABNORMAL LOW (ref >60)
Glucose, Bld: 93 mg/dL (ref 65–99)
Potassium: 3.6 mmol/L (ref 3.5–5.1)
Sodium: 139 mmol/L (ref 135–145)
Total Bilirubin: 0.5 mg/dL (ref 0.3–1.2)
Total Protein: 5.4 g/dL — ABNORMAL LOW (ref 6.5–8.1)

## 2016-10-28 LAB — OCCULT BLOOD X 1 CARD TO LAB, STOOL: Fecal Occult Bld: NEGATIVE

## 2016-10-28 MED ORDER — FLUCONAZOLE 100 MG PO TABS
100.0000 mg | ORAL_TABLET | Freq: Every day | ORAL | Status: AC
  Administered 2016-10-28 – 2016-10-29 (×2): 100 mg via ORAL
  Filled 2016-10-28 (×2): qty 1

## 2016-10-28 NOTE — Progress Notes (Signed)
Patient ID: Rachael [name], female   DOB: 01-19-1956, 61 y.o.   MRN: [id number] S:No complaints O:BP 103/62 (BP Location: Right Arm)   Pulse 66   Temp 98.5 F (36.9 C) (Oral)   Resp 18   Ht 5\' 2"  (1.575 m)   Wt 100.2 kg (220 lb 14.4 oz)   SpO2 100%   BMI 40.40 kg/m   Intake/Output Summary (Last 24 hours) at 10/28/16 1134 Last data filed at 10/28/16 1057  Gross per 24 hour  Intake             1122 ml  Output             1701 ml  Net             -579 ml   Intake/Output: I/O last 3 completed shifts: In: 1002 [P.O.:1002] Out: 3401 [Urine:3400; Stool:1]  Intake/Output this shift:  Total I/O In: 360 [P.O.:360] Out: 0  Weight change: -0.262 kg (-9.3 oz) Gen:WD WF in NAD CVS:no rub Resp:cta ERX:VQMGQQ Ext:tr edema   Recent Labs Lab 10/23/16 2055 10/24/16 0344 10/25/16 0651 10/26/16 0355 10/27/16 0441 10/28/16 0715  NA 138 139 141 141 139 139  K 4.1 4.0 3.7 3.5 3.4* 3.6  CL 107 117* 117* 113* 112* 112*  CO2 16* 12* 18* 18* 18* 18*  GLUCOSE 231* 109* 93 112* 94 93  BUN 45* 42* 31* 38* 37* 47*  CREATININE 3.51* 3.04* 2.99* 3.27* 3.20* 3.18*  ALBUMIN 3.8  --   --   --   --  3.0*  CALCIUM 9.1 7.4* 8.2* 8.4* 8.6* 8.7*  AST 20  --   --   --   --  11*  ALT 21  --   --   --   --  13*   Liver Function Tests:  Recent Labs Lab 10/23/16 2055 10/28/16 0715  AST 20 11*  ALT 21 13*  ALKPHOS 49 33*  BILITOT 0.9 0.5  PROT 7.1 5.4*  ALBUMIN 3.8 3.0*   No results for input(s): LIPASE, AMYLASE in the last 168 hours. No results for input(s): AMMONIA in the last 168 hours. CBC:  Recent Labs Lab 10/23/16 2055 10/24/16 0550 10/25/16 0651 10/26/16 0355 10/28/16 0715  WBC 20.0* 11.0* 7.9 9.7 10.0  NEUTROABS 18.9* 9.2* 6.3  --   --   HGB 11.1* 8.4* 8.6* 8.9* 8.9*  HCT 34.1* 26.3* 26.7* 27.3* 27.6*  MCV 81.6 81.7 82.2 81.0 81.4  PLT 214 125* 115* 134* 141*   Cardiac Enzymes:  Recent Labs Lab 10/26/16 0355 10/26/16 0935 10/26/16 1506  CKTOTAL  --  31*  --    TROPONINI <0.03 <0.03 <0.03   CBG:  Recent Labs Lab 10/26/16 2047 10/27/16 0745 10/27/16 1152 10/27/16 1634 10/28/16 0749  GLUCAP 136* 85 99 124* 90    Iron Studies:  Recent Labs  10/27/16 1659  IRON 47  TIBC 266  FERRITIN 118   Studies/Results: No results found. . citalopram  20 mg Oral Daily  . clopidogrel  75 mg Oral Daily  . DAPTOmycin (CUBICIN)  IV  6 mg/kg Intravenous Q48H  . famotidine  20 mg Oral Daily  . heparin subcutaneous  5,000 Units Subcutaneous Q8H  . insulin aspart  0-9 Units Subcutaneous TID WC  . levothyroxine  88 mcg Oral QAC breakfast  . metoprolol tartrate  25 mg Oral BID  . predniSONE  40 mg Oral Q breakfast    BMET    Component Value Date/Time   NA  139 10/28/2016 0715   K 3.6 10/28/2016 0715   CL 112 (H) 10/28/2016 0715   CO2 18 (L) 10/28/2016 0715   GLUCOSE 93 10/28/2016 0715   BUN 47 (H) 10/28/2016 0715   CREATININE 3.18 (H) 10/28/2016 0715   CALCIUM 8.7 (L) 10/28/2016 0715   CALCIUM 8.7 10/07/2016 1418   GFRNONAA 15 (L) 10/28/2016 0715   GFRAA 17 (L) 10/28/2016 0715   CBC    Component Value Date/Time   WBC 10.0 10/28/2016 0715   RBC 3.39 (L) 10/28/2016 0715   HGB 8.9 (L) 10/28/2016 0715   HCT 27.6 (L) 10/28/2016 0715   PLT 141 (L) 10/28/2016 0715   MCV 81.4 10/28/2016 0715   MCH 26.3 10/28/2016 0715   MCHC 32.2 10/28/2016 0715   RDW 17.7 (H) 10/28/2016 0715   LYMPHSABS 1.1 10/25/2016 0651   MONOABS 0.4 10/25/2016 0651   EOSABS 0.0 10/25/2016 0651   BASOSABS 0.0 10/25/2016 0651     Assessment/Plan:  1. AKI due to AIN- Scr stable, continue with prednisone 40mg  daily, started 10/07/16 and continue to avoid PPI's.  Follow up with Dr. Hollie Salk in our office after discharge.  Nothing further to add.  Will sign off.  Please call with any questions or concerns.  Donetta Potts, MD [employer] [phone]

## 2016-10-28 NOTE — Progress Notes (Addendum)
PROGRESS NOTE    Rachael [name]  DTO:[id number] DOB: 04-18-1956 DOA: 10/23/2016 PCP: Ronita Hipps, MD   Brief Narrative:  61 y.o.femalewith medical history significant of CHF, chronic kidney disease, CVA without residual deficit, hypertension, hypothyroidism, hyperlipidemia who was recently discharged for acute interstitial nephritis on 10/14/2017 (on prednisone 40 mg a day) who is coming to the emergency department referred by her nephrologist after the patient's recently drawn blood cultures grew gram-positive cocci in clusters. The patient stated that she has been experiencing fevers, fatigue, night sweats and chills at home.  Assessment & Plan:   Principal Problem:   Bacteremia Active Problems:   UTI (urinary tract infection)   HTN (hypertension)   Hypothyroidism   Chronic kidney disease (CKD), stage V (HCC)   History of CVA (cerebrovascular accident)   Depression   Hyperlipidemia   Hemodialysis catheter infection ([place])   Interstitial nephritis   HD catheter associated  MR coagulase-negative staphylococcal bacteremia  - likely 2/2 HD cath ascess. Now d/c. Leukocytosis Down trending. No fevers. - Vanc and cefepime per pharmacy. 2/16 >>2/18 - Started on IV Daptomycin 2/17>>changed to daptomycin to avoid nephrotoxicity of vancomycin in this patient with reovering renal function from interstitial nephritis - TTE negative for vegetations - Cards Consulted for TEE 2/18. Which was negative , no vegetation , no PFO - ID and nephrology recs appreciated.  - Nephro-recs appreciated- No more HD. Cont prednisone.  - Blood culture results obtained from [facility] Center- drawn 10/21/16, MR coagulase negative staph X2 - B/c 2/15- Coag neg staph one bottle,  MRSE bacteremia = documented cleared on 2/16. curently on day 5 of 14 of abtx regimen    Paroxysmal A. Fib.- Symptomatic, this a.m. No previous occurrence. Likely related to current bacteremia infection. Now NSR. Troponins 3 negative.  TSH - normal. EKG revealing atrial fibrillation, rates- 127. Patient denies history of irregular heart rhythm. History of TIA lasting a few hrs, ~2 years ago without residual deficit, on plavix. CHAD2Vasc Score- 4. Talked to pt about anticoagulation, risks and benefit.  May need anticoagulation after being worked up for anemia, and if there is a true indication , based on prior neurologic evaluations . Currently she is in  NSR , and on plavix, - TTE 2/6 1unremarkable TSH low    Anemia of chronic disease Baseline around 10, will check FOBT Likely due to ckd 'check FOBT   HTN (hypertension) Intermittently Elevated BP, no antihypertensives on home medication list. Cont  metoprolol 25 mg twice a day    Hypothyroidism- Continue levothyroxine 100 g by mouth daily. TSH low- 0.2. - F/u in outpt setting, after resolution of acute illness, Reduce synthroid to 75 mcg    Chronic kidney disease (CKD), stage V ([place])- 2/2 AIN,  -  On prednisone 40mg  daily, continue - Nephrology recs appreciated- kidney function improving, No HD required.    Hyperlipidemia Continue atorvastatin 40 mg by mouth daily.    History of CVA. Continue Plavix.    Depression Continue Celexa 40 mg by mouth daily.    DVT prophylaxis: Lovenox SQ. Code Status: Full code. Family Communication:  Disposition Plan:  TEE today Consults called: Nephrology Dr.Upton, ID.  Admission status: Inpatient/tele   Procedures: Echo 2/16- Left ventricle: The cavity size was normal. Wall thickness was   normal. Systolic function was normal. The estimated ejection   fraction was in the range of 60% to 65%. Wall motion was normal;   there were no regional wall motion abnormalities. Left  ventricular diastolic function parameters were normal.  Antimicrobials:   Vanc 2/16 >>2/18  ceftriazone 2/15 >>2/18  Daptomycin 2/18>>  Subjective: Resting comfortably in bed, denies any joint pain, shortness of breath, chest  pain  Objective: Vitals:   10/27/16 2103 10/28/16 0530 10/28/16 0956 10/28/16 1039  BP: 105/62 (!) 99/48 119/65 103/62  Pulse: 77 (!) 57 63 66  Resp: 17 17 18    Temp: 98.6 F (37 C) 98.3 F (36.8 C) 98.5 F (36.9 C)   TempSrc: Oral Oral Oral   SpO2: 97% 95% 100%   Weight: 100.2 kg (220 lb 14.4 oz)     Height:        Intake/Output Summary (Last 24 hours) at 10/28/16 1251 Last data filed at 10/28/16 1057  Gross per 24 hour  Intake             1122 ml  Output             1701 ml  Net             -579 ml   Filed Weights   10/26/16 2056 10/27/16 1403 10/27/16 2103  Weight: 99.6 kg (219 lb 9.3 oz) 99.3 kg (219 lb) 100.2 kg (220 lb 14.4 oz)    Examination:  General exam: Appears calm and comfortable  Respiratory system: Clear to auscultation. Respiratory effort normal. Cardiovascular system: S1 & S2 heard, RRR. 2/6 systolic murmur heard in aortic area. Denies ever told she had a murmur. Gastrointestinal system: Abdomen is nondistended, soft and nontender. No organomegaly or masses felt. Normal bowel sounds heard. Central nervous system: Alert and oriented. No focal neurological deficits. Extremities: Symmetric 5 x 5 power. Skin: No rashes, lesions or ulcers Psychiatry: Judgement and insight appear normal. Mood & affect appropriate.   Data Reviewed: I have personally reviewed following labs and imaging studies  CBC:  Recent Labs Lab 10/23/16 2055 10/24/16 0550 10/25/16 0651 10/26/16 0355 10/28/16 0715  WBC 20.0* 11.0* 7.9 9.7 10.0  NEUTROABS 18.9* 9.2* 6.3  --   --   HGB 11.1* 8.4* 8.6* 8.9* 8.9*  HCT 34.1* 26.3* 26.7* 27.3* 27.6*  MCV 81.6 81.7 82.2 81.0 81.4  PLT 214 125* 115* 134* 250*   Basic Metabolic Panel:  Recent Labs Lab 10/24/16 0344 10/25/16 0651 10/26/16 0355 10/27/16 0441 10/28/16 0715  NA 139 141 141 139 139  K 4.0 3.7 3.5 3.4* 3.6  CL 117* 117* 113* 112* 112*  CO2 12* 18* 18* 18* 18*  GLUCOSE 109* 93 112* 94 93  BUN 42* 31* 38* 37* 47*   CREATININE 3.04* 2.99* 3.27* 3.20* 3.18*  CALCIUM 7.4* 8.2* 8.4* 8.6* 8.7*   Liver Function Tests:  Recent Labs Lab 10/23/16 2055 10/28/16 0715  AST 20 11*  ALT 21 13*  ALKPHOS 49 33*  BILITOT 0.9 0.5  PROT 7.1 5.4*  ALBUMIN 3.8 3.0*   Coagulation Profile:  Recent Labs Lab 10/23/16 2055  INR 1.07   CBG:  Recent Labs Lab 10/27/16 0745 10/27/16 1152 10/27/16 1634 10/28/16 0749 10/28/16 1201  GLUCAP 85 99 124* 90 122*   Sepsis Labs:  Recent Labs Lab 10/23/16 2111 10/24/16 0344  LATICACIDVEN 3.02* 1.2    Recent Results (from the past 240 hour(s))  Culture, blood (Routine x 2)     Status: Abnormal   Collection Time: 10/23/16  9:00 PM  Result Value Ref Range Status   Specimen Description BLOOD LEFT ANTECUBITAL  Final   Special Requests BOTTLES DRAWN AEROBIC AND ANAEROBIC 5CC  Final   Culture  Setup Time   Final    GRAM POSITIVE COCCI IN CLUSTERS IN BOTH AEROBIC AND ANAEROBIC BOTTLES CRITICAL VALUE NOTED.  VALUE IS CONSISTENT WITH PREVIOUSLY REPORTED AND CALLED VALUE.    Culture (A)  Final    STAPHYLOCOCCUS SPECIES (COAGULASE NEGATIVE) SUSCEPTIBILITIES PERFORMED ON PREVIOUS CULTURE WITHIN THE LAST 5 DAYS.    Report Status 10/27/2016 FINAL  Final  Culture, blood (Routine x 2)     Status: Abnormal   Collection Time: 10/23/16  9:20 PM  Result Value Ref Range Status   Specimen Description BLOOD RIGHT ANTECUBITAL  Final   Special Requests BOTTLES DRAWN AEROBIC AND ANAEROBIC 5CC  Final   Culture  Setup Time   Final    GRAM POSITIVE COCCI IN CLUSTERS IN BOTH AEROBIC AND ANAEROBIC BOTTLES CRITICAL RESULT CALLED TO, READ BACK BY AND VERIFIED WITH: G ABBOTT PHARMD 2331 10/24/16 A BROWNING    Culture STAPHYLOCOCCUS SPECIES (COAGULASE NEGATIVE) (A)  Final   Report Status 10/26/2016 FINAL  Final   Organism ID, Bacteria STAPHYLOCOCCUS SPECIES (COAGULASE NEGATIVE)  Final      Susceptibility   Staphylococcus species (coagulase negative) - MIC*    CIPROFLOXACIN  <=0.5 SENSITIVE Sensitive     ERYTHROMYCIN >=8 RESISTANT Resistant     GENTAMICIN <=0.5 SENSITIVE Sensitive     OXACILLIN >=4 RESISTANT Resistant     TETRACYCLINE 2 SENSITIVE Sensitive     VANCOMYCIN 2 SENSITIVE Sensitive     TRIMETH/SULFA <=10 SENSITIVE Sensitive     CLINDAMYCIN >=8 RESISTANT Resistant     RIFAMPIN <=0.5 SENSITIVE Sensitive     Inducible Clindamycin NEGATIVE Sensitive     * STAPHYLOCOCCUS SPECIES (COAGULASE NEGATIVE)  Blood Culture ID Panel (Reflexed)     Status: Abnormal   Collection Time: 10/23/16  9:20 PM  Result Value Ref Range Status   Enterococcus species NOT DETECTED NOT DETECTED Final   Listeria monocytogenes NOT DETECTED NOT DETECTED Final   Staphylococcus species DETECTED (A) NOT DETECTED Final    Comment: Methicillin (oxacillin) resistant coagulase negative staphylococcus. Possible blood culture contaminant (unless isolated from more than one blood culture draw or clinical case suggests pathogenicity). No antibiotic treatment is indicated for blood  culture contaminants. CRITICAL RESULT CALLED TO, READ BACK BY AND VERIFIED WITH: G ABBOTT PHARMD 2331 10/24/16 A BROWNING    Staphylococcus aureus NOT DETECTED NOT DETECTED Final   Methicillin resistance DETECTED (A) NOT DETECTED Final    Comment: CRITICAL RESULT CALLED TO, READ BACK BY AND VERIFIED WITH: G ABBOTT PHARMD 2331 10/24/16 A BROWNING    Streptococcus species NOT DETECTED NOT DETECTED Final   Streptococcus agalactiae NOT DETECTED NOT DETECTED Final   Streptococcus pneumoniae NOT DETECTED NOT DETECTED Final   Streptococcus pyogenes NOT DETECTED NOT DETECTED Final   Acinetobacter baumannii NOT DETECTED NOT DETECTED Final   Enterobacteriaceae species NOT DETECTED NOT DETECTED Final   Enterobacter cloacae complex NOT DETECTED NOT DETECTED Final   Escherichia coli NOT DETECTED NOT DETECTED Final   Klebsiella oxytoca NOT DETECTED NOT DETECTED Final   Klebsiella pneumoniae NOT DETECTED NOT DETECTED Final    Proteus species NOT DETECTED NOT DETECTED Final   Serratia marcescens NOT DETECTED NOT DETECTED Final   Haemophilus influenzae NOT DETECTED NOT DETECTED Final   Neisseria meningitidis NOT DETECTED NOT DETECTED Final   Pseudomonas aeruginosa NOT DETECTED NOT DETECTED Final   Candida albicans NOT DETECTED NOT DETECTED Final   Candida glabrata NOT DETECTED NOT DETECTED Final   Candida krusei NOT DETECTED  NOT DETECTED Final   Candida parapsilosis NOT DETECTED NOT DETECTED Final   Candida tropicalis NOT DETECTED NOT DETECTED Final  Culture, blood (Routine X 2) w Reflex to ID Panel     Status: None (Preliminary result)   Collection Time: 10/24/16  6:13 PM  Result Value Ref Range Status   Specimen Description BLOOD RIGHT ANTECUBITAL  Final   Special Requests BOTTLES DRAWN AEROBIC AND ANAEROBIC 5CC EACH  Final   Culture NO GROWTH 4 DAYS  Final   Report Status PENDING  Incomplete  Culture, blood (Routine X 2) w Reflex to ID Panel     Status: None (Preliminary result)   Collection Time: 10/24/16  6:13 PM  Result Value Ref Range Status   Specimen Description BLOOD LEFT ANTECUBITAL  Final   Special Requests BOTTLES DRAWN AEROBIC ONLY 8CC  Final   Culture NO GROWTH 4 DAYS  Final   Report Status PENDING  Incomplete  Culture, blood (Routine X 2) w Reflex to ID Panel     Status: None (Preliminary result)   Collection Time: 10/25/16 11:10 AM  Result Value Ref Range Status   Specimen Description BLOOD RIGHT ANTECUBITAL  Final   Special Requests BOTTLES DRAWN AEROBIC ONLY  5CC  Final   Culture NO GROWTH 3 DAYS  Final   Report Status PENDING  Incomplete  Culture, blood (Routine X 2) w Reflex to ID Panel     Status: None (Preliminary result)   Collection Time: 10/25/16 11:15 AM  Result Value Ref Range Status   Specimen Description BLOOD LEFT ARM  Final   Special Requests BOTTLES DRAWN AEROBIC ONLY  5CC  Final   Culture NO GROWTH 3 DAYS  Final   Report Status PENDING  Incomplete     Radiology  Studies: No results found. Scheduled Meds: . citalopram  20 mg Oral Daily  . clopidogrel  75 mg Oral Daily  . DAPTOmycin (CUBICIN)  IV  6 mg/kg Intravenous Q48H  . famotidine  20 mg Oral Daily  . heparin subcutaneous  5,000 Units Subcutaneous Q8H  . insulin aspart  0-9 Units Subcutaneous TID WC  . levothyroxine  88 mcg Oral QAC breakfast  . metoprolol tartrate  25 mg Oral BID  . predniSONE  40 mg Oral Q breakfast   Continuous Infusions:   LOS: 5 days    Reyne Dumas, MD [facility] Pager [phone]  If 7PM-7AM, please contact night-coverage [email or web address] Password [facility] 10/28/2016, 12:51 PM

## 2016-10-28 NOTE — Consult Note (Signed)
   [facility] Inpatient Consult   10/28/2016  Rachael [name] Jul 11, 1956 [id number]   Chart reviewed for [facility] services and for re-admission with a HX of HF with the [facility].  Chart review reveals the patient is a 61 y.o.femalewith medical history significant of CHF, chronic kidney disease, CVA without residual deficit, hypertension, hypothyroidism, hyperlipidemia who was recently discharged for acute interstitial nephritis on 10/14/2017 (on prednisone 40 mg a day) who is coming to the emergency department referred by her nephrologist after the patient's recently drawn blood cultures grew gram-positive cocci in clusters. Met with the patient at the bedside.  Patient states she lives alone but does not have trouble getting to her doctor. She endorses Dr. Kennith Maes in [place] as her primary care provider.  She denies any issues with medications with access or affordability. She gets them by mail and also at [facility] in [place]. Offered post hospital follow up calls and be assessed for home visits.  Patient states she did not feel that she needed any follow up as she has only been in the hospital two times here.  Patient declined services at this time.  Patient did accept a brochure with [facility] contact information and a magnet with the 24 hour nurse advise line.  For question, please contact:  Natividad Brood, RN BSN [facility] Liaison  [phone] business mobile phone Toll free office [phone]

## 2016-10-28 NOTE — Progress Notes (Signed)
Patient continues to be afebrile. Repeat blood cx ngtd.   Discussing with [facility] if patient can get 1 week of daptomycin through peripheral IV. Technically only needs 3 additional doses (QOD) to finish out course after today's dose. Next dose is due on 2/22.  If she does not look like she would be an easy access for venipuncture, then we would need to get oral linezolid 600mg  BID x 7 days. Recommend to see which option is cheaper, both can have high out of pocket cost for the patient  Caren Griffins B. [place] [facility] [phone]

## 2016-10-28 NOTE — Progress Notes (Signed)
   Please see my complete consult note from October 27, 2016. I have strongly recommended that hospital records from [facility] and [facility] be obtained and reviewed carefully to fully understand the patient's prior neurologic evaluations and decisions about the approach to her TIAs. This should be done before the final decision is made about the possible use on anticoagulant.  Daryel November, MD

## 2016-10-29 ENCOUNTER — Inpatient Hospital Stay (HOSPITAL_COMMUNITY): Payer: Medicare HMO

## 2016-10-29 LAB — OCCULT BLOOD X 1 CARD TO LAB, STOOL: FECAL OCCULT BLD: NEGATIVE

## 2016-10-29 LAB — CULTURE, BLOOD (ROUTINE X 2)
CULTURE: NO GROWTH
CULTURE: NO GROWTH

## 2016-10-29 LAB — GLUCOSE, CAPILLARY
GLUCOSE-CAPILLARY: 124 mg/dL — AB (ref 65–99)
GLUCOSE-CAPILLARY: 172 mg/dL — AB (ref 65–99)
GLUCOSE-CAPILLARY: 187 mg/dL — AB (ref 65–99)
Glucose-Capillary: 86 mg/dL (ref 65–99)

## 2016-10-29 NOTE — Progress Notes (Signed)
  MRSE bacteremia:  Currently on day 7 of 14 for management of MRSE bacteremia. Patient appears to be not a good candidate for peripheral IV. Can give another dose of daptomycin tomorrow, then would only need 5 days of linezolid 600mg  PO BID to start on 2/24 to finish out course  Will sign off. Call if questions  Caren Griffins B. [place] [facility] [phone]

## 2016-10-29 NOTE — Progress Notes (Signed)
Abrol, MD called and stated to ask the patient if she has had a stroke in the past and what hospital she received care at. Also, she stated to inform the patient that she would have a MRI done to assess. Patient stated that she has had, "mini-strokes" in the past and has received treatment at [facility]. I informed the patient that Abrol, MD has ordered for the patient to have a MRI completed. MD notified. Will continue to monitor.

## 2016-10-29 NOTE — Progress Notes (Signed)
Progress Note  Patient Name: Rachael [name] Date of Encounter: 10/29/2016  Primary Cardiologist: Dr. Burt Knack   Subjective   No complaints this morning. Feels fine.   Inpatient Medications    Scheduled Meds: . citalopram  20 mg Oral Daily  . clopidogrel  75 mg Oral Daily  . DAPTOmycin (CUBICIN)  IV  6 mg/kg Intravenous Q48H  . famotidine  20 mg Oral Daily  . fluconazole  100 mg Oral Daily  . heparin subcutaneous  5,000 Units Subcutaneous Q8H  . insulin aspart  0-9 Units Subcutaneous TID WC  . levothyroxine  88 mcg Oral QAC breakfast  . metoprolol tartrate  25 mg Oral BID  . predniSONE  40 mg Oral Q breakfast   Continuous Infusions:  PRN Meds:    Vital Signs    Vitals:   10/28/16 1039 10/28/16 1624 10/28/16 2029 10/29/16 0522  BP: 103/62 129/63 114/62 (!) 125/52  Pulse: 66 60 65 69  Resp:  18 18 18   Temp:  98 F (36.7 C) 98.5 F (36.9 C) 98 F (36.7 C)  TempSrc:  Oral Oral Oral  SpO2:  98% 98% 98%  Weight:   216 lb 12.8 oz (98.3 kg)   Height:        Intake/Output Summary (Last 24 hours) at 10/29/16 0753 Last data filed at 10/29/16 0600  Gross per 24 hour  Intake          1051.96 ml  Output             1401 ml  Net          -349.04 ml   Filed Weights   10/27/16 1403 10/27/16 2103 10/28/16 2029  Weight: 219 lb (99.3 kg) 220 lb 14.4 oz (100.2 kg) 216 lb 12.8 oz (98.3 kg)    Telemetry    Sinus brady 56 bpm - Personally Reviewed  GEN: No acute distress.   Neck: No JVD Cardiac: Regular rhythm, bradycardia, no murmurs, rubs, or gallops.  Respiratory: Clear to auscultation bilaterally. GI: Soft, nontender, non-distended  MS: No edema; No deformity. Neuro:  Nonfocal  Psych: Normal affect   Labs    Chemistry Recent Labs Lab 10/23/16 2055  10/26/16 0355 10/27/16 0441 10/28/16 0715  NA 138  < > 141 139 139  K 4.1  < > 3.5 3.4* 3.6  CL 107  < > 113* 112* 112*  CO2 16*  < > 18* 18* 18*  GLUCOSE 231*  < > 112* 94 93  BUN 45*  < > 38* 37* 47*    CREATININE 3.51*  < > 3.27* 3.20* 3.18*  CALCIUM 9.1  < > 8.4* 8.6* 8.7*  PROT 7.1  --   --   --  5.4*  ALBUMIN 3.8  --   --   --  3.0*  AST 20  --   --   --  11*  ALT 21  --   --   --  13*  ALKPHOS 49  --   --   --  33*  BILITOT 0.9  --   --   --  0.5  GFRNONAA 13*  < > 14* 14* 15*  GFRAA 15*  < > 16* 17* 17*  ANIONGAP 15  < > 10 9 9   < > = values in this interval not displayed.   Hematology Recent Labs Lab 10/25/16 0651 10/26/16 0355 10/27/16 1659 10/28/16 0715  WBC 7.9 9.7  --  10.0  RBC 3.25* 3.37* 3.76*  3.39*  HGB 8.6* 8.9*  --  8.9*  HCT 26.7* 27.3*  --  27.6*  MCV 82.2 81.0  --  81.4  MCH 26.5 26.4  --  26.3  MCHC 32.2 32.6  --  32.2  RDW 18.4* 17.9*  --  17.7*  PLT 115* 134*  --  141*    Cardiac Enzymes Recent Labs Lab 10/26/16 0355 10/26/16 0935 10/26/16 1506  TROPONINI <0.03 <0.03 <0.03   No results for input(s): TROPIPOC in the last 168 hours.   BNPNo results for input(s): BNP, PROBNP in the last 168 hours.   DDimer No results for input(s): DDIMER in the last 168 hours.   Radiology    No results found.  Cardiac Studies   TEE 10/27/16 to R/o endocarditis  Left Ventrical:  LVEF 60-65%  Mitral Valve: Mild MR, no vegetation  Aortic Valve: No vegetation  Tricuspid Valve: Mild TR, no vegetation  Pulmonic Valve: No vegetation  Left Atrium/ Left atrial appendage: No thrombus, normal velocities  Atrial septum: No ASD/PFO  Aorta: Mild non-mobile plaque.   Telemetry:  I personally reviewed telemetry today. There is sinus rhythm with mild sinus bradycardia. We have not seen any further atrial fibrillation.  Patient Profile     61 y/o female with h/o HTN, HLD, CKD, prior CVA and hypothyroidism. She was recently admitted for acute interstitial nephritis and discharged on 10/14/16. She was readmitted 10/24/16 for bacteremia with gram + cocci. TEE 2/19 was negative for valvular vegetations. Hospital course complicated by newly discovered  paroxsymal atrial fibrillation. Cardiology consulted for recommendations regarding oral anticoagulation.   Assessment & Plan    1. Bacteremia: blood cultures + for gram positive cocci. Currently on IV antibiotics, Daptomycin, per IM. Endocarditis ruled-out. TEE 2/19 was negative.   2. Paroxsysmal Atrial Fibrillation: no prior h/o arrhthymias. Recent occurrence is in the setting of bacteremia and possible over supplementation of her levothyroxin. Her rate is currently controlled with PO metoprolol, 25 mg BID and she is in NSR at present. Hopefully, her PAF  will resolve with continued treatment of her bacteremia and adjustment of her thyroid replacement. However her calculated CHA2DS2 VASc score is 4, for prior h/o recurrent CVAs, HTN and female sex, placing her at a 4.8% risk for stoke/yr. No etiology for her previous TIAs was found. Given her risk, and prior h/o unexplained recurrent TIAs, she would benefit from oral anticoagulation for secondary prevention of CVA. Given her CKD her options will be limited to Coumadin. However, she has anemia which needs further investigating. I would not start Coumadin until it is clear that she does not have anemia based on blood loss, and I would not start Coumadin until the records from [facility] are obtained and reviewed to completely understand the evaluation of neurologic events in the past. Attempts were made yesterday to obtain records. Request for medical records faxed. No records were sent back. Will make a second attempt today.   3. Interstitial Nephritis: management per nephrology.   4. Hypothyroidism: on levothyroxine as an outpatient. Was on 100 mcg/ day. TSH is low at 0.200. Free T4 normal at 1.08. In the setting of atrial fibrillation, her dose of her levothyroxine was lowered to 88 mcg/day.   5. Anemia: ? Etiology. Possibly from [place]. Hgb has ranged from 8-11 over the last month. Currently 8.9. Will need to monitor closely if patient is  placed on coumadin.      Signed, Lyda Jester, PA-C  10/29/2016, 7:54 AM  The patient's cardiac status is stable. I have outlined before the importance of knowing more about the patient's neurologic evaluations at [facility] and [facility] in the past. I would like to see if the patient's primary team could help to get involved obtaining this information. The cardiac decision about the use of possible anticoagulations will be straightforward once more is known about the patient's prior neurologic workups. The issues are not cardiac. They are neurologic.  Daryel November, MD

## 2016-10-29 NOTE — Progress Notes (Addendum)
PROGRESS NOTE    Rachael [name]  SEG:[id number] DOB: 1956-08-22 DOA: 10/23/2016 PCP: Ronita Hipps, MD   Brief Narrative:  61 y.o.femalewith medical history significant of CHF, chronic kidney disease, CVA without residual deficit, hypertension, hypothyroidism, hyperlipidemia who was recently discharged for acute interstitial nephritis on 10/14/2017 (on prednisone 40 mg a day) who is coming to the emergency department referred by her nephrologist after the patient's recently drawn blood cultures grew gram-positive cocci in clusters. The patient stated that she has been experiencing fevers, fatigue, night sweats and chills at home.  Assessment & Plan:   Principal Problem:   Bacteremia Active Problems:   UTI (urinary tract infection)   HTN (hypertension)   Hypothyroidism   Chronic kidney disease (CKD), stage V (HCC)   History of CVA (cerebrovascular accident)   Depression   Hyperlipidemia   Hemodialysis catheter infection ([place])   Interstitial nephritis   HD catheter associated  MR coagulase-negative staphylococcal bacteremia   - likely 2/2 HD cath ascess. Now d/c. Leukocytosis Down trending. No fevers. - Vanc and cefepime per pharmacy. 2/16 >>2/18 - Started on IV Daptomycin 2/17>>changed to daptomycin to avoid nephrotoxicity of vancomycin in this patient with reovering renal function from interstitial nephritis - TTE negative for vegetations - Cards Consulted for TEE 2/18. Which was negative , no vegetation , no PFO - ID and nephrology recs appreciated.  - Nephro-recs appreciated- No more HD. Cont prednisone.  - Blood culture [facility] obtained from [facility]- drawn 10/21/16, MR coagulase negative staph X2 - B/c 2/15- Coag neg staph one bottle,  MRSE bacteremia = documented cleared on 2/16.   Home health to verify if patient can get one week of daptomycin through a peripheral IV    Paroxysmal A. Fib.- no prior h/o arrhthymias. Recent occurrence is in the setting of bacteremia  and possible over supplementation of her levothyroxin. Her rate is currently controlled with PO metoprolol, 25 mg BID and she is in NSR at present. Troponins 3 negative. TSH - normal.  EKG revealing atrial fibrillation, rates- 127 upon admission. Patient denies history of irregular heart rhythm. History of TIA lasting a few hrs, ~2 years ago without residual deficit, on plavix. -Look at the history below from care everywhere  CHAD2Vasc Score- 4.  Given her CKD her options will be limited to Coumadin May need anticoagulation after being worked up for anemia, and if there is a true indication , based on prior neurologic evaluations . Currently she is in  NSR , and on plavix, TTE 2/6  Unremarkable TSH low   "hospitalization at [facility] from 12/31/13-01/03/14 secondary to left sided weakness and paresthesias. She says she had a syncopal episode during hospital stay and had 2 brain CT scans and brain MRI which showed a cerebral infarct. She received TPA. Carotid US and echo were done; apparently both normal. She was discharged to home on Plavix." I do not see MRI from [place] , will order to see if patient has every had a CVA , to determine need for anticoagulation     Anemia of chronic disease Baseline around 10, will check FOBT Likely due to ckd 'check FOBT   HTN (hypertension) Intermittently Elevated BP, no antihypertensives on home medication list. Cont  metoprolol 25 mg twice a day   Hypothyroidism- Continue levothyroxine 100 g by mouth daily. TSH low- 0.2. - F/u in outpt setting, after resolution of acute illness, Reduce synthroid to 88  mcg    Chronic kidney disease (CKD), stage  V ([place])- 2/2 AIN,  -  On prednisone 40mg  daily, continue - Nephrology recs appreciated- kidney function improving, No HD required.    Hyperlipidemia Continue atorvastatin 40 mg by mouth daily.    History of CVA. Continue Plavix.    Depression Continue Celexa 40 mg by mouth  daily.    DVT prophylaxis: Lovenox SQ. Code Status: Full code. Family Communication:  Disposition Plan:  TEE today Consults called: Nephrology Dr.Upton, ID.  Admission status: Inpatient/tele   Procedures: Echo 2/16- Left ventricle: The cavity size was normal. Wall thickness was   normal. Systolic function was normal. The estimated ejection   fraction was in the range of 60% to 65%. Wall motion was normal;   there were no regional wall motion abnormalities. Left    ventricular diastolic function parameters were normal.  Antimicrobials:   Vanc 2/16 >>2/18  ceftriazone 2/15 >>2/18  Daptomycin 2/18>>  Subjective: Resting comfortably in bed, denies any joint pain, shortness of breath, chest pain  Objective: Vitals:   10/28/16 1624 10/28/16 2029 10/29/16 0522 10/29/16 0912  BP: 129/63 114/62 (!) 125/52 (!) 99/57  Pulse: 60 65 69 62  Resp: 18 18 18 16   Temp: 98 F (36.7 C) 98.5 F (36.9 C) 98 F (36.7 C) 98.2 F (36.8 C)  TempSrc: Oral Oral Oral Oral  SpO2: 98% 98% 98% 98%  Weight:  98.3 kg (216 lb 12.8 oz)    Height:        Intake/Output Summary (Last 24 hours) at 10/29/16 0956 Last data filed at 10/29/16 0918  Gross per 24 hour  Intake          1411.96 ml  Output             1401 ml  Net            10.96 ml   Filed Weights   10/27/16 1403 10/27/16 2103 10/28/16 2029  Weight: 99.3 kg (219 lb) 100.2 kg (220 lb 14.4 oz) 98.3 kg (216 lb 12.8 oz)    Examination:  General exam: Appears calm and comfortable  Respiratory system: Clear to auscultation. Respiratory effort normal. Cardiovascular system: S1 & S2 heard, RRR. 2/6 systolic murmur heard in aortic area. Denies ever told she had a murmur. Gastrointestinal system: Abdomen is nondistended, soft and nontender. No organomegaly or masses felt. Normal bowel sounds heard. Central nervous system: Alert and oriented. No focal neurological deficits. Extremities: Symmetric 5 x 5 power. Skin: No rashes, lesions or  ulcers Psychiatry: Judgement and insight appear normal. Mood & affect appropriate.   Data Reviewed: I have personally reviewed following labs and imaging studies  CBC:  Recent Labs Lab 10/23/16 2055 10/24/16 0550 10/25/16 0651 10/26/16 0355 10/28/16 0715  WBC 20.0* 11.0* 7.9 9.7 10.0  NEUTROABS 18.9* 9.2* 6.3  --   --   HGB 11.1* 8.4* 8.6* 8.9* 8.9*  HCT 34.1* 26.3* 26.7* 27.3* 27.6*  MCV 81.6 81.7 82.2 81.0 81.4  PLT 214 125* 115* 134* 676*   Basic Metabolic Panel:  Recent Labs Lab 10/24/16 0344 10/25/16 0651 10/26/16 0355 10/27/16 0441 10/28/16 0715  NA 139 141 141 139 139  K 4.0 3.7 3.5 3.4* 3.6  CL 117* 117* 113* 112* 112*  CO2 12* 18* 18* 18* 18*  GLUCOSE 109* 93 112* 94 93  BUN 42* 31* 38* 37* 47*  CREATININE 3.04* 2.99* 3.27* 3.20* 3.18*  CALCIUM 7.4* 8.2* 8.4* 8.6* 8.7*   Liver Function Tests:  Recent Labs Lab 10/23/16 2055 10/28/16 0715  AST 20 11*  ALT 21 13*  ALKPHOS 49 33*  BILITOT 0.9 0.5  PROT 7.1 5.4*  ALBUMIN 3.8 3.0*   Coagulation Profile:  Recent Labs Lab 10/23/16 2055  INR 1.07   CBG:  Recent Labs Lab 10/28/16 0749 10/28/16 1201 10/28/16 1631 10/28/16 2026 10/29/16 0752  GLUCAP 90 122* 159* 131* 86   Sepsis Labs:  Recent Labs Lab 10/23/16 2111 10/24/16 0344  LATICACIDVEN 3.02* 1.2    Recent Results (from the past 240 hour(s))  Culture, blood (Routine x 2)     Status: Abnormal   Collection Time: 10/23/16  9:00 PM  Result Value Ref Range Status   Specimen Description BLOOD LEFT ANTECUBITAL  Final   Special Requests BOTTLES DRAWN AEROBIC AND ANAEROBIC 5CC  Final   Culture  Setup Time   Final    GRAM POSITIVE COCCI IN CLUSTERS IN BOTH AEROBIC AND ANAEROBIC BOTTLES CRITICAL VALUE NOTED.  VALUE IS CONSISTENT WITH PREVIOUSLY REPORTED AND CALLED VALUE.    Culture (A)  Final    STAPHYLOCOCCUS SPECIES (COAGULASE NEGATIVE) SUSCEPTIBILITIES PERFORMED ON PREVIOUS CULTURE WITHIN THE LAST 5 DAYS.    Report Status  10/27/2016 FINAL  Final  Culture, blood (Routine x 2)     Status: Abnormal   Collection Time: 10/23/16  9:20 PM  Result Value Ref Range Status   Specimen Description BLOOD RIGHT ANTECUBITAL  Final   Special Requests BOTTLES DRAWN AEROBIC AND ANAEROBIC 5CC  Final   Culture  Setup Time   Final    GRAM POSITIVE COCCI IN CLUSTERS IN BOTH AEROBIC AND ANAEROBIC BOTTLES CRITICAL RESULT CALLED TO, READ BACK BY AND VERIFIED WITH: G ABBOTT PHARMD 2331 10/24/16 A BROWNING    Culture STAPHYLOCOCCUS SPECIES (COAGULASE NEGATIVE) (A)  Final   Report Status 10/26/2016 FINAL  Final   Organism ID, Bacteria STAPHYLOCOCCUS SPECIES (COAGULASE NEGATIVE)  Final      Susceptibility   Staphylococcus species (coagulase negative) - MIC*    CIPROFLOXACIN <=0.5 SENSITIVE Sensitive     ERYTHROMYCIN >=8 RESISTANT Resistant     GENTAMICIN <=0.5 SENSITIVE Sensitive     OXACILLIN >=4 RESISTANT Resistant     TETRACYCLINE 2 SENSITIVE Sensitive     VANCOMYCIN 2 SENSITIVE Sensitive     TRIMETH/SULFA <=10 SENSITIVE Sensitive     CLINDAMYCIN >=8 RESISTANT Resistant     RIFAMPIN <=0.5 SENSITIVE Sensitive     Inducible Clindamycin NEGATIVE Sensitive     * STAPHYLOCOCCUS SPECIES (COAGULASE NEGATIVE)  Blood Culture ID Panel (Reflexed)     Status: Abnormal   Collection Time: 10/23/16  9:20 PM  Result Value Ref Range Status   Enterococcus species NOT DETECTED NOT DETECTED Final   Listeria monocytogenes NOT DETECTED NOT DETECTED Final   Staphylococcus species DETECTED (A) NOT DETECTED Final    Comment: Methicillin (oxacillin) resistant coagulase negative staphylococcus. Possible blood culture contaminant (unless isolated from more than one blood culture draw or clinical case suggests pathogenicity). No antibiotic treatment is indicated for blood  culture contaminants. CRITICAL RESULT CALLED TO, READ BACK BY AND VERIFIED WITH: G ABBOTT PHARMD 2331 10/24/16 A BROWNING    Staphylococcus aureus NOT DETECTED NOT DETECTED Final    Methicillin resistance DETECTED (A) NOT DETECTED Final    Comment: CRITICAL RESULT CALLED TO, READ BACK BY AND VERIFIED WITH: G ABBOTT PHARMD 2331 10/24/16 A BROWNING    Streptococcus species NOT DETECTED NOT DETECTED Final   Streptococcus agalactiae NOT DETECTED NOT DETECTED Final   Streptococcus pneumoniae NOT DETECTED NOT DETECTED Final  Streptococcus pyogenes NOT DETECTED NOT DETECTED Final   Acinetobacter baumannii NOT DETECTED NOT DETECTED Final   Enterobacteriaceae species NOT DETECTED NOT DETECTED Final   Enterobacter cloacae complex NOT DETECTED NOT DETECTED Final   Escherichia coli NOT DETECTED NOT DETECTED Final   Klebsiella oxytoca NOT DETECTED NOT DETECTED Final   Klebsiella pneumoniae NOT DETECTED NOT DETECTED Final   Proteus species NOT DETECTED NOT DETECTED Final   Serratia marcescens NOT DETECTED NOT DETECTED Final   Haemophilus influenzae NOT DETECTED NOT DETECTED Final   Neisseria meningitidis NOT DETECTED NOT DETECTED Final   Pseudomonas aeruginosa NOT DETECTED NOT DETECTED Final   Candida albicans NOT DETECTED NOT DETECTED Final   Candida glabrata NOT DETECTED NOT DETECTED Final   Candida krusei NOT DETECTED NOT DETECTED Final   Candida parapsilosis NOT DETECTED NOT DETECTED Final   Candida tropicalis NOT DETECTED NOT DETECTED Final  Culture, blood (Routine X 2) w Reflex to ID Panel     Status: None (Preliminary result)   Collection Time: 10/24/16  6:13 PM  Result Value Ref Range Status   Specimen Description BLOOD RIGHT ANTECUBITAL  Final   Special Requests BOTTLES DRAWN AEROBIC AND ANAEROBIC 5CC EACH  Final   Culture NO GROWTH 4 DAYS  Final   Report Status PENDING  Incomplete  Culture, blood (Routine X 2) w Reflex to ID Panel     Status: None (Preliminary result)   Collection Time: 10/24/16  6:13 PM  Result Value Ref Range Status   Specimen Description BLOOD LEFT ANTECUBITAL  Final   Special Requests BOTTLES DRAWN AEROBIC ONLY 8CC  Final   Culture NO GROWTH  4 DAYS  Final   Report Status PENDING  Incomplete  Culture, blood (Routine X 2) w Reflex to ID Panel     Status: None (Preliminary result)   Collection Time: 10/25/16 11:10 AM  Result Value Ref Range Status   Specimen Description BLOOD RIGHT ANTECUBITAL  Final   Special Requests BOTTLES DRAWN AEROBIC ONLY  5CC  Final   Culture NO GROWTH 3 DAYS  Final   Report Status PENDING  Incomplete  Culture, blood (Routine X 2) w Reflex to ID Panel     Status: None (Preliminary result)   Collection Time: 10/25/16 11:15 AM  Result Value Ref Range Status   Specimen Description BLOOD LEFT ARM  Final   Special Requests BOTTLES DRAWN AEROBIC ONLY  5CC  Final   Culture NO GROWTH 3 DAYS  Final   Report Status PENDING  Incomplete     Radiology Studies: No results found. Scheduled Meds: . citalopram  20 mg Oral Daily  . clopidogrel  75 mg Oral Daily  . DAPTOmycin (CUBICIN)  IV  6 mg/kg Intravenous Q48H  . famotidine  20 mg Oral Daily  . heparin subcutaneous  5,000 Units Subcutaneous Q8H  . insulin aspart  0-9 Units Subcutaneous TID WC  . levothyroxine  88 mcg Oral QAC breakfast  . metoprolol tartrate  25 mg Oral BID  . predniSONE  40 mg Oral Q breakfast   Continuous Infusions:   LOS: 6 days    Reyne Dumas, MD [facility] Pager [phone]  If 7PM-7AM, please contact night-coverage [email or web address] Password [facility] 10/29/2016, 9:56 AM

## 2016-10-29 NOTE — Progress Notes (Signed)
Pharmacy Antibiotic Note  Rachael [name] is a 61 y.o. female admitted on 10/23/2016 with bacteremia.  Pharmacy has been consulted for daptomycin dosing. Patient was previously being treated with vanc/ceftriaxone for 3 days. Today is day 7 of antibiotics for methicillin-resistant coag negative staph. WBC are within normal limits and patient is afebrile. Creatinine is remaining between 3- 3.2 with CrCl ~ 20 ml/min.   Noted plans for Daptomycin x 2 weeks with documentation of clearance date = 10/24/16.  Trying to arrange for outpatient IV Dapto vs PO Zyvox to complete course for bacteremia.  Plan: Continue Daptomycin 6 mg/kg ( 598 mg) every 48 hours. Weekly creatine kinase due Sunday 2/25 Discontinue atorvastatin while patient on Daptomycin therapy Monitor cultures and s/sx of clinical improvement.   Height: 5\' 2"  (157.5 cm) Weight: 216 lb 12.8 oz (98.3 kg) IBW/kg (Calculated) : 50.1  Temp (24hrs), Avg:98.2 F (36.8 C), Min:98 F (36.7 C), Max:98.5 F (36.9 C)   Recent Labs Lab 10/23/16 2055 10/23/16 2111 10/24/16 0344 10/24/16 0550 10/25/16 0651 10/26/16 0355 10/27/16 0441 10/28/16 0715  WBC 20.0*  --   --  11.0* 7.9 9.7  --  10.0  CREATININE 3.51*  --  3.04*  --  2.99* 3.27* 3.20* 3.18*  LATICACIDVEN  --  3.02* 1.2  --   --   --   --   --     Estimated Creatinine Clearance: 20.1 mL/min (by C-G formula based on SCr of 3.18 mg/dL (H)).    Allergies  Allergen Reactions  . Proton Pump Inhibitors     Biopsy proven acute interstitial nephritis; required dialysis in 09/2016  . Penicillins Rash and Other (See Comments)    Tolerates rocephin  . Sulfa Antibiotics Rash    Antimicrobials this admission: Ceftriaxone 2/16>>2/18 Cefepime 2/15 >> 2/16 Vancomycin 2/15  >>2/18 Daptomycin 2/18>>    Microbiology results: 2/12 BCx (done at [facility], called to Renal MDs): GPC in clusters  2/15 BCx: 2o2 MRSE 2/16 BCx: NGTD 2/17 BCx: NGTD  Thank you for allowing pharmacy to be a part of  this patient's care.  [employer], Pharm.D., BCPS Clinical Pharmacist Pager [phone] 10/29/2016 10:27 AM

## 2016-10-29 NOTE — Progress Notes (Signed)
Patients B/P is 99/57 and HR is 63. MD notified. Ordered to hold metoprolol. Orders followed. Will continue to monitor.

## 2016-10-30 ENCOUNTER — Telehealth: Payer: Self-pay | Admitting: *Deleted

## 2016-10-30 DIAGNOSIS — I480 Paroxysmal atrial fibrillation: Secondary | ICD-10-CM

## 2016-10-30 HISTORY — DX: Paroxysmal atrial fibrillation: I48.0

## 2016-10-30 LAB — GLUCOSE, CAPILLARY
GLUCOSE-CAPILLARY: 92 mg/dL (ref 65–99)
GLUCOSE-CAPILLARY: 136 mg/dL — AB (ref 65–99)

## 2016-10-30 LAB — BASIC METABOLIC PANEL
Anion gap: 10 (ref 5–15)
BUN: 51 mg/dL — AB (ref 6–20)
CHLORIDE: 108 mmol/L (ref 101–111)
CO2: 20 mmol/L — AB (ref 22–32)
CREATININE: 3.01 mg/dL — AB (ref 0.44–1.00)
Calcium: 9.3 mg/dL (ref 8.9–10.3)
GFR calc Af Amer: 18 mL/min — ABNORMAL LOW (ref >60)
GFR calc non Af Amer: 16 mL/min — ABNORMAL LOW (ref >60)
GLUCOSE: 142 mg/dL — AB (ref 65–99)
POTASSIUM: 4.3 mmol/L (ref 3.5–5.1)
SODIUM: 138 mmol/L (ref 135–145)

## 2016-10-30 LAB — CULTURE, BLOOD (ROUTINE X 2)
CULTURE: NO GROWTH
CULTURE: NO GROWTH

## 2016-10-30 MED ORDER — CITALOPRAM HYDROBROMIDE 40 MG PO TABS: 40.0000 mg | ORAL_TABLET | Freq: Every day | ORAL | 0 refills | Status: DC

## 2016-10-30 MED ORDER — LINEZOLID 600 MG PO TABS: 600.0000 mg | ORAL_TABLET | Freq: Two times a day (BID) | ORAL | 0 refills | Status: DC

## 2016-10-30 MED ORDER — PREDNISONE 20 MG PO TABS: 40.0000 mg | ORAL_TABLET | Freq: Every day | ORAL | 0 refills | Status: DC

## 2016-10-30 MED ORDER — LEVOTHYROXINE SODIUM 88 MCG PO TABS: 88.0000 ug | ORAL_TABLET | Freq: Every day | ORAL | 1 refills | Status: DC

## 2016-10-30 MED ORDER — METOPROLOL TARTRATE 25 MG PO TABS: 25.0000 mg | ORAL_TABLET | Freq: Two times a day (BID) | ORAL | 2 refills | Status: DC

## 2016-10-30 MED ORDER — SODIUM CHLORIDE 0.9 % IV SOLN: 600.0000 mg | INTRAVENOUS | 0 refills | Status: AC

## 2016-10-30 MED ORDER — METOPROLOL TARTRATE 25 MG PO TABS: 25.0000 mg | ORAL_TABLET | Freq: Every day | ORAL | 2 refills | Status: DC

## 2016-10-30 NOTE — Care Management Note (Signed)
HomeCase Management Note  Patient Details  Name: Rachael [name] MRN: [id number] Date of Birth: [birth year]  Subjective/Objective:        CM following for progression and d/c planning.            Action/Plan: 10/30/2016 [facility] working with pt re ongoing IV Daptaomycin, pt will travel to [facility] for this IV antibiotic on Saturday 11/01/16 and Monday 11/03/2016. No HH needs or DME needs identified.  Expected Discharge Date:  10/30/16               Expected Discharge Plan:   Home with selfcare  In-House Referral:     Discharge [place] for IV antibiotics.  Post Acute Care Choice:   NA Choice offered to:   NA  DME Arranged:   NA DME Agency:   NA  HH Arranged:   NA HH Agency:   NA  Status of Service:   Complete  If discussed at [place] of Stay Meetings, dates discussed:    Additional Comments:  Adron Bene, RN 10/30/2016, 10:42 AM

## 2016-10-30 NOTE — Progress Notes (Signed)
Rachael [name] to be D/C'd Home per MD order.  Discussed prescriptions and follow up appointments with the patient. Prescriptions given to patient, medication list explained in detail. Pt verbalized understanding.  Allergies as of 10/30/2016      Reactions   Proton Pump Inhibitors    Biopsy proven acute interstitial nephritis; required dialysis in 09/2016   Penicillins Rash, Other (See Comments)   Tolerates rocephin   Sulfa Antibiotics Rash      Medication List    TAKE these medications   atorvastatin 40 MG tablet Commonly known as:  LIPITOR Take 40 mg by mouth daily.   citalopram 40 MG tablet Commonly known as:  CELEXA Take 1 tablet (40 mg total) by mouth daily. Start taking on:  11/06/2016   clopidogrel 75 MG tablet Commonly known as:  PLAVIX Take 75 mg by mouth daily.   DAPTOmycin 600 mg in sodium chloride 0.9 % 100 mL Inject 600 mg into the vein every other day. Start taking on:  11/01/2016   famotidine 20 MG tablet Commonly known as:  PEPCID Take 1 tablet (20 mg total) by mouth 2 (two) times daily.   levothyroxine 88 MCG tablet Commonly known as:  SYNTHROID, LEVOTHROID Take 1 tablet (88 mcg total) by mouth daily before breakfast. Start taking on:  10/31/2016 What changed:  medication strength  how much to take   metoprolol tartrate 25 MG tablet Commonly known as:  LOPRESSOR Take 1 tablet (25 mg total) by mouth daily.   predniSONE 20 MG tablet Commonly known as:  DELTASONE Take 2 tablets (40 mg total) by mouth daily with breakfast. Start taking on:  10/31/2016       Vitals:   10/30/16 0859 10/30/16 0956  BP: 129/61 (!) 95/52  Pulse: (!) 54 64  Resp: 16   Temp: 97.7 F (36.5 C)     Skin clean, dry and intact without evidence of skin break down, no evidence of skin tears noted. IV catheter discontinued intact. Site without signs and symptoms of complications. Dressing and pressure applied. Pt denies pain at this time. No complaints noted.  An After Visit  Summary was printed and given to the patient. Patient escorted via [place], and D/C home via private auto.  Dixie Dials RN, BSN

## 2016-10-30 NOTE — Progress Notes (Signed)
Patient states that her MD took her off of the metoprolol D/T syncopal episodes. Allyson Sabal, MD notified. Abrol, MD stated that the reason she has been prescribed this medication is D/T to her afib. She suggested for me to tell the patient to take it at night. Patient informed. Will continue to monitor.

## 2016-10-30 NOTE — Progress Notes (Signed)
[facility] called asking that, if possible, could we keep the peripheral IV in at the time of the patients discharge to obtain IV antibiotics at [facility] because she is a difficult stick. Allyson Sabal, MD notified. Verbal order given to maintain PIV at time of D/C. Orders placed. IV team consulted to flush with normal saline and heparin. Will continue to monitor.

## 2016-10-30 NOTE — Telephone Encounter (Signed)
Order for daptomycin 600mg  once on 2/24 and once on 2/26 faxed to [facility]. Landis Gandy, RN

## 2016-10-30 NOTE — Progress Notes (Signed)
MRSE bacteremia:  Due to high out of pocket cost, patient unable to afford linezolid. Will go back to original plan of receiving daptomycin 600mg  Iv on 2/22 (while she is hospitalized) then next dose at [facility] on 2/24 and 2/26. Orders for abtx have been sent from my office to the [facility]. This has been communicated to  Dr. Allyson Sabal. [facility].

## 2016-10-30 NOTE — Progress Notes (Signed)
[facility]  Original plan was for home health for last 2-3 doses of IV Daptomycin.  Pt has extremely compromised venous access. Dr. Storm Frisk note states can use LInezolid 600mg  po BID as backup option. [facility] received online rejection for PO Linezolid. I called [employer] plan and spoke with the rep for 1 hour providing information requested without being able to obtain authorization.  Per rep this would still take 24-72 hours. Explained urgency of needing to DC pt today and rep states he will expedite but will still be at least 24 -48 hours. Spoke with pt about options.  Final arrangements made for pt to go to [facility] for doses on Saturday and Sunday.  All information requested faxed to contact Beth at [facility] (Phone361 [phone]/fax:  [phone]) Dr. Storm Frisk office has faxed the script since she has privileges at [facility] , RN, [facility] IV team consulted  regarding allowing current IV catheter to remain in place at DC to home. Agreed since pt has such limited venous access, it was in the patients best interest to leave this IV catheter in place as long as it remains unremarkable. Call to [facility], RN on the unit and advised to call IV Team to flush with heparin at DC to support catheter patency.  Alois Cliche will also call the MD to obtain order to allow IV catheter to dwell. Pt and Jasmine Pang, RN, Case Manager and Dr. Baxter Flattery aware of above plans.   If patient discharges after hours, please call [phone].   Larry Sierras 10/30/2016, 12:11 PM

## 2016-10-30 NOTE — Progress Notes (Signed)
IV team nurse called ans stated that the PIV didn't need to be flushed with heparin. She stated that in order to keep the PIV patent, it would need to be flushed twice a day. With discussion, it is best to remove PIV D/T increased risk of infection and lack of maintenance. Allyson Sabal, MD notified. Ordered to remove PIV. Will continue to monitor.

## 2016-10-30 NOTE — Discharge Summary (Addendum)
Physician Discharge Summary  Dezarae Mcclaran Schwalbe MRN: [id number] DOB/AGE: 1956-01-07 61 y.o.  PCP: Ronita Hipps, MD   Admit date: 10/23/2016 Discharge date: 10/30/2016  Discharge Diagnoses:    Principal Problem:   Bacteremia Active Problems:   UTI (urinary tract infection)   HTN (hypertension)   Hypothyroidism   Chronic kidney disease (CKD), stage V (HCC)   History of CVA (cerebrovascular accident)   Depression   Hyperlipidemia   Hemodialysis catheter infection ([facility])   Interstitial nephritis   Paroxysmal atrial fibrillation (HCC)    Follow-up recommendations Follow-up with PCP in 3-5 days , including all  additional recommended appointments as below Follow-up CBC, CMP in 3-5 days Patient would need to follow-up with nephrology,Dr. Hollie Salk  Patient to follow-up with neurology, to determine need for long-term anticoagulation for documented atrial fibrillation and history of TIA, as soon as possible  Patient would need outpatient follow-up and age appropriate screening for anemia Patient needs repeat thyroid function tests in the next 6 weeks due to recent adjustment in levothyroxine  Patient will continue with daptomycin at [facility] infusion center    Current Discharge Medication List    START taking these medications   Details  DAPTOmycin 600 mg in sodium chloride 0.9 % 100 mL Inject 600 mg into the vein every other day. Qty: 2 Dose, Refills: 0    metoprolol tartrate (LOPRESSOR) 25 MG tablet Take 1 tablet (25 mg total) by mouth daily. Qty: 60 tablet, Refills: 2      CONTINUE these medications which have CHANGED   Details  citalopram (CELEXA) 40 MG tablet Take 1 tablet (40 mg total) by mouth daily. Qty: 30 tablet, Refills: 0    levothyroxine (SYNTHROID, LEVOTHROID) 88 MCG tablet Take 1 tablet (88 mcg total) by mouth daily before breakfast. Qty: 30 tablet, Refills: 1    predniSONE (DELTASONE) 20 MG tablet Take 2 tablets (40 mg total) by mouth daily with breakfast. Qty:  30 tablet, Refills: 0      CONTINUE these medications which have NOT CHANGED   Details  atorvastatin (LIPITOR) 40 MG tablet Take 40 mg by mouth daily.     clopidogrel (PLAVIX) 75 MG tablet Take 75 mg by mouth daily.    famotidine (PEPCID) 20 MG tablet Take 1 tablet (20 mg total) by mouth 2 (two) times daily. Qty: 60 tablet, Refills: 0        Discharge Condition: Stable   Discharge Instructions Get Medicines reviewed and adjusted: Please take all your medications with you for your next visit with your Primary MD  Please request your Primary MD to go over all hospital tests and procedure/radiological results at the follow up, please ask your Primary MD to get all Hospital records sent to his/her office.  If you experience worsening of your admission symptoms, develop shortness of breath, life threatening emergency, suicidal or homicidal thoughts you must seek medical attention immediately by calling 911 or calling your MD immediately if symptoms less severe.  You must read complete instructions/literature along with all the possible adverse reactions/side effects for all the Medicines you take and that have been prescribed to you. Take any new Medicines after you have completely understood and accpet all the possible adverse reactions/side effects.   Do not drive when taking Pain medications.   Do not take more than prescribed Pain, Sleep and Anxiety Medications  Special Instructions: If you have smoked or chewed Tobacco in the last 2 yrs please stop smoking, stop any regular Alcohol and or any Recreational  drug use.  Wear Seat belts while driving.  Please note  You were cared for by a hospitalist during your hospital stay. Once you are discharged, your primary care physician will handle any further medical issues. Please note that NO REFILLS for any discharge medications will be authorized once you are discharged, as it is imperative that you return to your primary care  physician (or establish a relationship with a primary care physician if you do not have one) for your aftercare needs so that they can reassess your need for medications and monitor your lab values.  Discharge Instructions    Diet - low sodium heart healthy    Complete by:  As directed    Increase activity slowly    Complete by:  As directed        Allergies  Allergen Reactions  . Proton Pump Inhibitors     Biopsy proven acute interstitial nephritis; required dialysis in 09/2016  . Penicillins Rash and Other (See Comments)    Tolerates rocephin  . Sulfa Antibiotics Rash      Disposition: 01-Home or Self Care   Consults: * Nephrology Cardiology Infectious disease     Significant Diagnostic Studies:  Dg Chest 2 View  Result Date: 10/23/2016 CLINICAL DATA:  Fever, chest pain, shortness of breath. EXAM: CHEST  2 VIEW COMPARISON:  Radiographs of September 25, 2016. FINDINGS: The heart size and mediastinal contours are within normal limits. Both lungs are clear. No pneumothorax or pleural effusion is noted. Eventration of the anterior portion of right hemidiaphragm is noted. Interval placement of right internal jugular catheter with distal tip at the expected position of cavoatrial junction. The visualized skeletal structures are unremarkable. IMPRESSION: No active cardiopulmonary disease. Electronically Signed   By: Marijo Conception, M.D.   On: 10/23/2016 21:21   Mr Brain Wo Contrast  Result Date: 10/29/2016 CLINICAL DATA:  61 year old female with bacteremia. Infected dialysis catheter. Initial encounter. EXAM: MRI HEAD WITHOUT CONTRAST TECHNIQUE: Multiplanar, multiecho pulse sequences of the brain and surrounding structures were obtained without intravenous contrast. COMPARISON:  Brain MRI 04/14/2017 and earlier. FINDINGS: Brain: Stable cerebral volume, within normal limits for age. No restricted diffusion to suggest acute infarction. No midline shift, mass effect, evidence of mass  lesion, ventriculomegaly, extra-axial collection or acute intracranial hemorrhage. Cervicomedullary junction and pituitary are within normal limits. Stable gray and white matter signal throughout the brain. Scattered mostly subcortical nonspecific cerebral white matter T2 and FLAIR hyperintensity. Extent is mild to moderate for age. No cortical encephalomalacia. Questionable tiny chronic microhemorrhage in the left corona radiata on series 8, image 67. Deep gray matter nuclei, brainstem, and cerebellum remain normal. Vascular: Major intracranial vascular flow voids appear stable and normal. Skull and upper cervical spine: Negative. Visualized bone marrow signal is within normal limits. Sinuses/Orbits: Orbits soft tissues appear stable and negative. Resolved right sphenoid sinus mucosal thickening. Other: Visible internal auditory structures appear normal. Mastoids remain clear. Negative scalp soft tissues. IMPRESSION: 1.  No acute intracranial abnormality. 2. Stable noncontrast MRI appearance of the brain with mild for age nonspecific cerebral white matter signal changes, most commonly due to chronic small vessel disease. No prior large or medium size vessel infarct identified. Electronically Signed   By: Genevie Ann M.D.   On: 10/29/2016 14:50   US Abdomen Complete  Result Date: 10/04/2016 CLINICAL DATA:  Nausea and vomiting, remote cholecystectomy, chronic renal disease EXAM: ABDOMEN ULTRASOUND COMPLETE COMPARISON:  09/19/2016, 09/21/2017 FINDINGS: Gallbladder: Surgically absent Common bile duct: Diameter: 2.4  mm, difficult to visualize because of bowel gas. Liver: Limited assessment of the left lobe secondary to bowel gas. Imaged portion of the liver demonstrates no definite focal abnormality or biliary dilatation. Patent portal vein with normal hepatopetal flow. IVC: Limited visualization because of body habitus Pancreas: Visualized portion unremarkable. Spleen: Size and appearance within normal limits. Right  Kidney: Length: 8.8 cm. Cortical thinning. Normal echogenicity. No hydronephrosis. No significant focal abnormality Left Kidney: Length: 9.2 cm. Similar cortical thinning. No definite focal abnormality or hydronephrosis Abdominal aorta: No aneurysm visualized. Other findings: None. IMPRESSION: Limited because of body habitus and bowel gas. Remote cholecystectomy No definite acute intra-abdominal finding by ultrasound Electronically Signed   By: Jerilynn Mages.  Shick M.D.   On: 10/04/2016 10:06   US Biopsy  Result Date: 10/02/2016 CLINICAL DATA:  Acute on chronic kidney disease and need for random renal biopsy. EXAM: ULTRASOUND GUIDED CORE BIOPSY OF RIGHT KIDNEY MEDICATIONS: 2.0 mg IV Versed; 75 mcg IV Fentanyl Total Moderate Sedation Time: 15 minutes. The patient's level of consciousness and physiologic status were continuously monitored during the procedure by Radiology nursing. PROCEDURE: The procedure, risks, benefits, and alternatives were explained to the patient. Questions regarding the procedure were encouraged and answered. The patient understands and consents to the procedure. A time out was performed prior to initiating the procedure. The right flank region was prepped with chlorhexidine in a sterile fashion, and a sterile drape was applied covering the operative field. A sterile gown and sterile gloves were used for the procedure. Local anesthesia was provided with 1% Lidocaine. Initial ultrasound was used to inspect both kidneys. The right was chosen for biopsy. Under ultrasound guidance, 2 separate 16 gauge core biopsy passes were made into lower pole cortex. Material was submitted in saline. Post biopsy imaging was performed with ultrasound. COMPLICATIONS: None. FINDINGS: Visualization of the right kidney was better by ultrasound. The left kidney also demonstrated more significant lower pole cortical thinning compared to the right. Solid tissue was obtained with biopsy. IMPRESSION: Ultrasound-guided core  biopsy performed of the right kidney at the level of lower pole cortex. Electronically Signed   By: Aletta Edouard M.D.   On: 10/02/2016 11:59       Filed Weights   10/27/16 2103 10/28/16 2029 10/29/16 2029  Weight: 100.2 kg (220 lb 14.4 oz) 98.3 kg (216 lb 12.8 oz) 98.4 kg (216 lb 14.4 oz)     Microbiology: Recent Results (from the past 240 hour(s))  Culture, blood (Routine x 2)     Status: Abnormal   Collection Time: 10/23/16  9:00 PM  Result Value Ref Range Status   Specimen Description BLOOD LEFT ANTECUBITAL  Final   Special Requests BOTTLES DRAWN AEROBIC AND ANAEROBIC 5CC  Final   Culture  Setup Time   Final    GRAM POSITIVE COCCI IN CLUSTERS IN BOTH AEROBIC AND ANAEROBIC BOTTLES CRITICAL VALUE NOTED.  VALUE IS CONSISTENT WITH PREVIOUSLY REPORTED AND CALLED VALUE.    Culture (A)  Final    STAPHYLOCOCCUS SPECIES (COAGULASE NEGATIVE) SUSCEPTIBILITIES PERFORMED ON PREVIOUS CULTURE WITHIN THE LAST 5 DAYS.    Report Status 10/27/2016 FINAL  Final  Culture, blood (Routine x 2)     Status: Abnormal   Collection Time: 10/23/16  9:20 PM  Result Value Ref Range Status   Specimen Description BLOOD RIGHT ANTECUBITAL  Final   Special Requests BOTTLES DRAWN AEROBIC AND ANAEROBIC 5CC  Final   Culture  Setup Time   Final    GRAM POSITIVE COCCI IN  CLUSTERS IN BOTH AEROBIC AND ANAEROBIC BOTTLES CRITICAL RESULT CALLED TO, READ BACK BY AND VERIFIED WITH: G ABBOTT PHARMD [id number] 10/24/16 A BROWNING    Culture STAPHYLOCOCCUS SPECIES (COAGULASE NEGATIVE) (A)  Final   Report Status 10/26/2016 FINAL  Final   Organism ID, Bacteria STAPHYLOCOCCUS SPECIES (COAGULASE NEGATIVE)  Final      Susceptibility   Staphylococcus species (coagulase negative) - MIC*    CIPROFLOXACIN <=0.5 SENSITIVE Sensitive     ERYTHROMYCIN >=8 RESISTANT Resistant     GENTAMICIN <=0.5 SENSITIVE Sensitive     OXACILLIN >=4 RESISTANT Resistant     TETRACYCLINE 2 SENSITIVE Sensitive     VANCOMYCIN 2 SENSITIVE Sensitive      TRIMETH/SULFA <=10 SENSITIVE Sensitive     CLINDAMYCIN >=8 RESISTANT Resistant     RIFAMPIN <=0.5 SENSITIVE Sensitive     Inducible Clindamycin NEGATIVE Sensitive     * STAPHYLOCOCCUS SPECIES (COAGULASE NEGATIVE)  Blood Culture ID Panel (Reflexed)     Status: Abnormal   Collection Time: 10/23/16  9:20 PM  Result Value Ref Range Status   Enterococcus species NOT DETECTED NOT DETECTED Final   Listeria monocytogenes NOT DETECTED NOT DETECTED Final   Staphylococcus species DETECTED (A) NOT DETECTED Final    Comment: Methicillin (oxacillin) resistant coagulase negative staphylococcus. Possible blood culture contaminant (unless isolated from more than one blood culture draw or clinical case suggests pathogenicity). No antibiotic treatment is indicated for blood  culture contaminants. CRITICAL RESULT CALLED TO, READ BACK BY AND VERIFIED WITH: G ABBOTT PHARMD 2331 10/24/16 A BROWNING    Staphylococcus aureus NOT DETECTED NOT DETECTED Final   Methicillin resistance DETECTED (A) NOT DETECTED Final    Comment: CRITICAL RESULT CALLED TO, READ BACK BY AND VERIFIED WITH: G ABBOTT PHARMD 2331 10/24/16 A BROWNING    Streptococcus species NOT DETECTED NOT DETECTED Final   Streptococcus agalactiae NOT DETECTED NOT DETECTED Final   Streptococcus pneumoniae NOT DETECTED NOT DETECTED Final   Streptococcus pyogenes NOT DETECTED NOT DETECTED Final   Acinetobacter baumannii NOT DETECTED NOT DETECTED Final   Enterobacteriaceae species NOT DETECTED NOT DETECTED Final   Enterobacter cloacae complex NOT DETECTED NOT DETECTED Final   Escherichia coli NOT DETECTED NOT DETECTED Final   Klebsiella oxytoca NOT DETECTED NOT DETECTED Final   Klebsiella pneumoniae NOT DETECTED NOT DETECTED Final   Proteus species NOT DETECTED NOT DETECTED Final   Serratia marcescens NOT DETECTED NOT DETECTED Final   Haemophilus influenzae NOT DETECTED NOT DETECTED Final   Neisseria meningitidis NOT DETECTED NOT DETECTED Final    Pseudomonas aeruginosa NOT DETECTED NOT DETECTED Final   Candida albicans NOT DETECTED NOT DETECTED Final   Candida glabrata NOT DETECTED NOT DETECTED Final   Candida krusei NOT DETECTED NOT DETECTED Final   Candida parapsilosis NOT DETECTED NOT DETECTED Final   Candida tropicalis NOT DETECTED NOT DETECTED Final  Culture, blood (Routine X 2) w Reflex to ID Panel     Status: None   Collection Time: 10/24/16  6:13 PM  Result Value Ref Range Status   Specimen Description BLOOD RIGHT ANTECUBITAL  Final   Special Requests BOTTLES DRAWN AEROBIC AND ANAEROBIC 5CC EACH  Final   Culture NO GROWTH 5 DAYS  Final   Report Status 10/29/2016 FINAL  Final  Culture, blood (Routine X 2) w Reflex to ID Panel     Status: None   Collection Time: 10/24/16  6:13 PM  Result Value Ref Range Status   Specimen Description BLOOD LEFT ANTECUBITAL  Final  Special Requests BOTTLES DRAWN AEROBIC ONLY 8CC  Final   Culture NO GROWTH 5 DAYS  Final   Report Status 10/29/2016 FINAL  Final  Culture, blood (Routine X 2) w Reflex to ID Panel     Status: None (Preliminary result)   Collection Time: 10/25/16 11:10 AM  Result Value Ref Range Status   Specimen Description BLOOD RIGHT ANTECUBITAL  Final   Special Requests BOTTLES DRAWN AEROBIC ONLY  5CC  Final   Culture NO GROWTH 4 DAYS  Final   Report Status PENDING  Incomplete  Culture, blood (Routine X 2) w Reflex to ID Panel     Status: None (Preliminary result)   Collection Time: 10/25/16 11:15 AM  Result Value Ref Range Status   Specimen Description BLOOD LEFT ARM  Final   Special Requests BOTTLES DRAWN AEROBIC ONLY  5CC  Final   Culture NO GROWTH 4 DAYS  Final   Report Status PENDING  Incomplete       Blood Culture    Component Value Date/Time   SDES BLOOD LEFT ARM 10/25/2016 1115   SPECREQUEST BOTTLES DRAWN AEROBIC ONLY  5CC 10/25/2016 1115   CULT NO GROWTH 4 DAYS 10/25/2016 1115   REPTSTATUS PENDING 10/25/2016 1115      Labs: Results for orders  placed or performed during the hospital encounter of 10/23/16 (from the past 48 hour(s))  Glucose, capillary     Status: Abnormal   Collection Time: 10/28/16 12:01 PM  Result Value Ref Range   Glucose-Capillary 122 (H) 65 - 99 mg/dL  Glucose, capillary     Status: Abnormal   Collection Time: 10/28/16  4:31 PM  Result Value Ref Range   Glucose-Capillary 159 (H) 65 - 99 mg/dL   Comment 1 Notify RN   Occult blood card to lab, stool     Status: None   Collection Time: 10/28/16  8:04 PM  Result Value Ref Range   Fecal Occult Bld NEGATIVE NEGATIVE  Glucose, capillary     Status: Abnormal   Collection Time: 10/28/16  8:26 PM  Result Value Ref Range   Glucose-Capillary 131 (H) 65 - 99 mg/dL  Glucose, capillary     Status: None   Collection Time: 10/29/16  7:52 AM  Result Value Ref Range   Glucose-Capillary 86 65 - 99 mg/dL  Glucose, capillary     Status: Abnormal   Collection Time: 10/29/16 12:07 PM  Result Value Ref Range   Glucose-Capillary 124 (H) 65 - 99 mg/dL  Glucose, capillary     Status: Abnormal   Collection Time: 10/29/16  4:09 PM  Result Value Ref Range   Glucose-Capillary 172 (H) 65 - 99 mg/dL  Glucose, capillary     Status: Abnormal   Collection Time: 10/29/16  8:33 PM  Result Value Ref Range   Glucose-Capillary 187 (H) 65 - 99 mg/dL  Occult blood card to lab, stool     Status: None   Collection Time: 10/29/16  9:31 PM  Result Value Ref Range   Fecal Occult Bld NEGATIVE NEGATIVE  Glucose, capillary     Status: None   Collection Time: 10/30/16  7:17 AM  Result Value Ref Range   Glucose-Capillary 92 65 - 99 mg/dL   Comment 1 Notify RN    Comment 2 Document in Chart      Lipid Panel  No results found for: CHOL, TRIG, HDL, CHOLHDL, VLDL, LDLCALC, LDLDIRECT   Lab Results  Component Value Date   HGBA1C 5.6 09/24/2016  HPI :   61 y/o female with h/o HTN, HLD, CKD, prior recurrent CVAs and hyperthyroidism. She was recently admitted for acute interstitial  nephritis and discharged on 10/14/16, on prednisone. She presented back to the ED on 10/24/16, referred by her nephrologist, after recently drawn blood cultures grew gram-positive cocci in clusters. She was admitted by hospitalist service for bacteremia and placed on antibiotics. Infectious disease was consulted , she underwent TEE with Dr. Meda Coffee today at 1400 to r/o endocarditis which was negative.    This admission for bacteremia has also been complicated by paroxysmal atrial fibrillation and possible over supplementation with thyroid hormone replacement therapy. There is no prior h/o atrial fibrillation, that we are aware of. It is felt that her afib is likely secondary to her acute illness with bacteremia. Troponins are negative x 3. 2D echo 10/24/16 showed normal LVEF at 60-65% with normal wall motion. LV diastolic function parameters were normal.  Her left atrium is normal in size. TSH is low at 0.200. Free T4 mildly elevated. She is mildly hypokalemic with K of 3.4. SCr is 3.20. BUN 37.  She was treated with Daptomycin, IV. Her rate is controlled with metoprolol, 25 mg BID.  She is on Plavix given prior h/o TIA ~ 2 years ago w/o residual deficits. Her calculated CHA2DS2 VASc score is 4 for h/o CVA(2), HTN and female sex. Her risk for stroke is 4.8% per year. Of note, she also has anemia. Hgb has ranged from 8-11 over the last 2 months. Hgb today is 8.9.    HOSPITAL COURSE:   HD catheter associated MR coagulase-negative staphylococcal bacteremia - likely 2/2 HD cath ascess. Now d/c. Leukocytosis Down trending. No fevers. - Vanc and cefepime per pharmacy. 2/16 >>2/18 - Started on IV Daptomycin 2/17>>changed to daptomycin to avoid nephrotoxicity of vancomycin in this patient with reovering renal function from interstitial nephritis - TTE negative for vegetations - Cards Consulted for TEE 2/18. Which was negative , no vegetation , no PFO - ID and nephrology recs appreciated.  - Nephro-recs  appreciated- No more HD. Cont prednisone.  - Blood culture results obtained from [facility] Center- drawn 10/21/16, MR coagulase negative staph X2 - B/c 2/15- Coag neg staph one bottle,  MRSE bacteremia = documented cleared on 2/16. curently on day 5 of 14 of abtx regimen Currently on day 8  of 14 for management of MRSE bacteremia. Infectious disease recommends 5 days of linezolid 600mg  PO BID , however this was not approved by her insurance Therefore we will continue with daptomycin at [facility] infusion center. She gets 1 dose on 2/24 and the next one on 2/26 to complete her course   Paroxysmal A. Fib.- Symptomatic, this a.m. No previous occurrence. Likely related to current bacteremia infection. Now NSR. Troponins 3 negative. TSH - normal. EKG revealing atrial fibrillation, rates- 127. Patient denies history of irregular heart rhythm. History of TIA lasting a few hrs, ~2 years ago without residual deficit, on plavix. CHAD2Vasc Score- 4. Talked to pt about anticoagulation, risks and benefit.  May need anticoagulation after being worked up for anemia, and if there is a true indication , based on prior neurologic evaluations . Currently she is in  NSR , and on plavix, - TTE 2/6 1unremarkable TSH low    Anemia of chronic disease Baseline around 10, now ranging between 8-9 FOBT negative this admission We'll continue with Plavix for now  HTN (hypertension) Intermittently Elevated BP, no antihypertensives on home medication list. Cont  metoprolol 25  mg daily. Dose clarified with cardiology prior to discharge  Hypothyroidism- Continue levothyroxine 100 g by mouth daily. TSH low- 0.2. - F/u in outpt setting, after resolution of acute illness, Reduce synthroid to 88 mcg. Recommend repeat thyroid function testing in the next 4-6 weeks    Chronic kidney disease (CKD), stage V ([place])- 2/2 AIN,  -  On prednisone 40mg  daily, continue - Nephrology recs appreciated- kidney function improving,  No HD required. Patient to follow-up in nephrology in the outpatient setting  Hyperlipidemia Continue atorvastatin 40 mg by mouth daily.  History of CVA?/TIA. Continue Plavix. MRI this admission negative for any documentation of prior CVA Given confirmed documentation of atrial fibrillation on EKG during this admission, cardiology was consulted and recommended Coumadin. However we need to clarify specifics of patient's admission in  2015 when she was admitted to [facility] for TIA and initiated on Plavix Moreover she would need clearance from GI, GI workup for anemia, prior to being committed to long-term anticoagulation She would need  neurology follow-up, as soon as possible after his discharge to be evaluated for long-term anticoagulation in the setting of documented atrial fibrillation. This was explained to the patient Continue with Plavix for now  Depression Continue Celexa 40 mg by mouth daily.     Discharge Exam: *  Blood pressure (!) 95/52, pulse 64, temperature 97.7 F (36.5 C), temperature source Oral, resp. rate 16, height 5\' 2"  (1.575 m), weight 98.4 kg (216 lb 14.4 oz), SpO2 98 %.  General exam: Appears calm and comfortable  Respiratory system: Clear to auscultation. Respiratory effort normal. Cardiovascular system: S1 & S2 heard, RRR. 2/6 systolic murmur heard in aortic area. Denies ever told she had a murmur. Gastrointestinal system: Abdomen is nondistended, soft and nontender. No organomegaly or masses felt. Normal bowel sounds heard. Central nervous system: Alert and oriented. No focal neurological deficits. Extremities: Symmetric 5 x 5 power. Skin: No rashes, lesions or ulcers Psychiatry: Judgement and insight appear normal. Mood & affect appropriate.      Follow-up Information    HOLT,LYNLEY S, MD. Schedule an appointment as soon as possible for a visit in 3 day(s).   Specialty:  Family Medicine Contact information: [address] [place]        Madelon Lips, MD. Call.   Specialty:  Nephrology Why:  To make follow-up appointment ASAP Contact information: [place] [address] [phone]           Signed: Reyne Dumas 10/30/2016, 10:04 AM        Time spent >45 mins

## 2016-10-30 NOTE — Progress Notes (Signed)
Patients B/P is 95/52 and Hr. 64. Abrol, MD notified. Order to hold metoprolol. Orders followed. Will continue to monitor.

## 2016-10-30 NOTE — Progress Notes (Signed)
THE FOLLOWING PARAGRAPH WAS IN MY CONSULT NOTE OF 10/27/2016:  I have spoken at length with the patient. I have carefully reviewed all data available. Patient had some palpitations with her atrial fibrillation here in the hospital. She has had some palpitations in the past. However, it is my understanding that atrial fibrillation has not been documented in the past. She has paroxysmal atrial fibrillation. It is possible that the episode of atrial fibrillation documented this admission is partially related to thyroid replacement with a low TSH. In addition, her atrial fib could be related to the stress of her bacteremia. The patient has a high CHA2DS2 VASc score. In addition, she has reports of prior TIAs including at least one episode of decreased left sided weakness that was assessed fully in the hospital. The records suggest [facility]. The patient told me [facility]. In all of the notes that I have read from [facility] outpatient visits, the specifics of the hospital evaluation were based on information from the patient. It appears that she received TPA on one occasion. There is no mention of a cerebral angiogram. In summary, considering all of the points above, I feel that anticoagulation is indicated. With her renal disease, I suspect that Coumadin would be the best choice (if nephrology agrees). However, I would not start Coumadin until  1)  It  is felt that anemia is not from blood loss,  And  2)  records of her hospitalization for treatment of an acute neurologic event are obtained and reviewed carefully so that we know everything about prior evaluations before instituting Coumadin.  THE CURRENT STATUS OF EVALUATION:      At this time my recommendation remains the same. The use of Coumadin is probably the most appropriate approach at this time. However, there are still unanswered questions. We still do not know from exact hospital records exactly what type of neurologic evaluations the  patient has had in the past. We know only what she explains was done. We do not know why she was not treated with Coumadin in the past. I understand that it seems that atrial fibrillation has not been documented in the past. We have now seen atrial fibrillation on one occasion on one standard EKG. With her prior history, this would probably be enough to encourage Coumadin on an ongoing basis. However, I am personally not willing to recommend Coumadin in this patient until we know exactly what happened to her on prior hospitalizations for neurologic problems. She tells me that she was evaluated at [facility] in the past in addition to [facility]. Also, I am assuming that it is felt that GI blood loss has been ruled out as a component of her anemia. I do not know if this is the case yet.  RECCOMENDATION;    1) primary care team consider calling medical records at [facility] and getting exact data including discharge summaries and neurologic evaluations.     2) primary care team consider calling [facility] medical records to find out if the patient has in fact had an admission for a neurologic problem there. If so, obtain exact records including tests, neurology opinions, and discharge summaries.     3) Primary care team considering asking neurology to consult at this time to give [place] further input.  Until this evaluation is complete, I am unwilling to give a formal recommendation to start Coumadin.   Daryel November, MD

## 2016-10-31 ENCOUNTER — Encounter (HOSPITAL_COMMUNITY): Payer: Self-pay

## 2016-10-31 NOTE — Telephone Encounter (Signed)
Addendum to note that these were verbal orders per Dr Baxter Flattery, cosigned by Dr Linus Salmons. Landis Gandy, RN

## 2016-11-01 DIAGNOSIS — R7881 Bacteremia: Secondary | ICD-10-CM | POA: Diagnosis not present

## 2016-11-03 DIAGNOSIS — Z87898 Personal history of other specified conditions: Secondary | ICD-10-CM | POA: Diagnosis not present

## 2016-11-03 DIAGNOSIS — R7881 Bacteremia: Secondary | ICD-10-CM | POA: Diagnosis not present

## 2016-11-19 DIAGNOSIS — Z87898 Personal history of other specified conditions: Secondary | ICD-10-CM | POA: Diagnosis not present

## 2016-11-19 DIAGNOSIS — N10 Acute tubulo-interstitial nephritis: Secondary | ICD-10-CM | POA: Diagnosis not present

## 2016-11-19 DIAGNOSIS — D649 Anemia, unspecified: Secondary | ICD-10-CM | POA: Diagnosis not present

## 2016-11-19 DIAGNOSIS — M791 Myalgia: Secondary | ICD-10-CM | POA: Diagnosis not present

## 2016-11-19 DIAGNOSIS — E669 Obesity, unspecified: Secondary | ICD-10-CM | POA: Diagnosis not present

## 2016-11-19 DIAGNOSIS — R399 Unspecified symptoms and signs involving the genitourinary system: Secondary | ICD-10-CM | POA: Diagnosis not present

## 2016-11-19 DIAGNOSIS — R112 Nausea with vomiting, unspecified: Secondary | ICD-10-CM | POA: Diagnosis not present

## 2016-11-26 ENCOUNTER — Inpatient Hospital Stay (HOSPITAL_COMMUNITY)
Admission: EM | Admit: 2016-11-26 | Discharge: 2016-12-04 | DRG: 683 | Disposition: A | Discharge status: Home / Self Care | Payer: Medicare HMO | Attending: Family Medicine | Admitting: Family Medicine

## 2016-11-26 ENCOUNTER — Encounter (HOSPITAL_COMMUNITY): Payer: Self-pay

## 2016-11-26 ENCOUNTER — Emergency Department (HOSPITAL_COMMUNITY): Payer: Medicare HMO

## 2016-11-26 DIAGNOSIS — I480 Paroxysmal atrial fibrillation: Secondary | ICD-10-CM | POA: Diagnosis present

## 2016-11-26 DIAGNOSIS — E039 Hypothyroidism, unspecified: Secondary | ICD-10-CM | POA: Diagnosis not present

## 2016-11-26 DIAGNOSIS — R1114 Bilious vomiting: Secondary | ICD-10-CM

## 2016-11-26 DIAGNOSIS — R609 Edema, unspecified: Secondary | ICD-10-CM | POA: Diagnosis present

## 2016-11-26 DIAGNOSIS — N12 Tubulo-interstitial nephritis, not specified as acute or chronic: Secondary | ICD-10-CM | POA: Diagnosis present

## 2016-11-26 DIAGNOSIS — F320 Major depressive disorder, single episode, mild: Secondary | ICD-10-CM | POA: Diagnosis not present

## 2016-11-26 DIAGNOSIS — Z8673 Personal history of transient ischemic attack (TIA), and cerebral infarction without residual deficits: Secondary | ICD-10-CM

## 2016-11-26 DIAGNOSIS — N2581 Secondary hyperparathyroidism of renal origin: Secondary | ICD-10-CM | POA: Diagnosis present

## 2016-11-26 DIAGNOSIS — Z882 Allergy status to sulfonamides status: Secondary | ICD-10-CM

## 2016-11-26 DIAGNOSIS — Z8249 Family history of ischemic heart disease and other diseases of the circulatory system: Secondary | ICD-10-CM | POA: Diagnosis not present

## 2016-11-26 DIAGNOSIS — N390 Urinary tract infection, site not specified: Secondary | ICD-10-CM | POA: Diagnosis not present

## 2016-11-26 DIAGNOSIS — Z823 Family history of stroke: Secondary | ICD-10-CM

## 2016-11-26 DIAGNOSIS — F329 Major depressive disorder, single episode, unspecified: Secondary | ICD-10-CM | POA: Diagnosis present

## 2016-11-26 DIAGNOSIS — Z880 Allergy status to penicillin: Secondary | ICD-10-CM | POA: Diagnosis not present

## 2016-11-26 DIAGNOSIS — N3000 Acute cystitis without hematuria: Secondary | ICD-10-CM | POA: Diagnosis not present

## 2016-11-26 DIAGNOSIS — N179 Acute kidney failure, unspecified: Secondary | ICD-10-CM | POA: Diagnosis not present

## 2016-11-26 DIAGNOSIS — N185 Chronic kidney disease, stage 5: Secondary | ICD-10-CM | POA: Diagnosis not present

## 2016-11-26 DIAGNOSIS — I129 Hypertensive chronic kidney disease with stage 1 through stage 4 chronic kidney disease, or unspecified chronic kidney disease: Secondary | ICD-10-CM | POA: Diagnosis not present

## 2016-11-26 DIAGNOSIS — D638 Anemia in other chronic diseases classified elsewhere: Secondary | ICD-10-CM | POA: Diagnosis present

## 2016-11-26 DIAGNOSIS — K449 Diaphragmatic hernia without obstruction or gangrene: Secondary | ICD-10-CM | POA: Diagnosis present

## 2016-11-26 DIAGNOSIS — E785 Hyperlipidemia, unspecified: Secondary | ICD-10-CM | POA: Diagnosis present

## 2016-11-26 DIAGNOSIS — T380X5A Adverse effect of glucocorticoids and synthetic analogues, initial encounter: Secondary | ICD-10-CM | POA: Diagnosis present

## 2016-11-26 DIAGNOSIS — Z7902 Long term (current) use of antithrombotics/antiplatelets: Secondary | ICD-10-CM

## 2016-11-26 DIAGNOSIS — Z888 Allergy status to other drugs, medicaments and biological substances status: Secondary | ICD-10-CM | POA: Diagnosis not present

## 2016-11-26 DIAGNOSIS — E872 Acidosis: Secondary | ICD-10-CM | POA: Diagnosis present

## 2016-11-26 DIAGNOSIS — N189 Chronic kidney disease, unspecified: Secondary | ICD-10-CM

## 2016-11-26 DIAGNOSIS — I120 Hypertensive chronic kidney disease with stage 5 chronic kidney disease or end stage renal disease: Secondary | ICD-10-CM | POA: Diagnosis not present

## 2016-11-26 DIAGNOSIS — N184 Chronic kidney disease, stage 4 (severe): Secondary | ICD-10-CM | POA: Diagnosis not present

## 2016-11-26 DIAGNOSIS — I10 Essential (primary) hypertension: Secondary | ICD-10-CM | POA: Diagnosis not present

## 2016-11-26 DIAGNOSIS — R112 Nausea with vomiting, unspecified: Secondary | ICD-10-CM | POA: Diagnosis not present

## 2016-11-26 DIAGNOSIS — D631 Anemia in chronic kidney disease: Secondary | ICD-10-CM | POA: Diagnosis not present

## 2016-11-26 DIAGNOSIS — N183 Chronic kidney disease, stage 3 (moderate): Secondary | ICD-10-CM | POA: Diagnosis not present

## 2016-11-26 DIAGNOSIS — F32A Depression, unspecified: Secondary | ICD-10-CM | POA: Diagnosis present

## 2016-11-26 DIAGNOSIS — Z79899 Other long term (current) drug therapy: Secondary | ICD-10-CM | POA: Diagnosis not present

## 2016-11-26 DIAGNOSIS — N178 Other acute kidney failure: Secondary | ICD-10-CM | POA: Diagnosis not present

## 2016-11-26 DIAGNOSIS — R0602 Shortness of breath: Secondary | ICD-10-CM | POA: Diagnosis not present

## 2016-11-26 DIAGNOSIS — K219 Gastro-esophageal reflux disease without esophagitis: Secondary | ICD-10-CM | POA: Diagnosis present

## 2016-11-26 DIAGNOSIS — N170 Acute kidney failure with tubular necrosis: Secondary | ICD-10-CM | POA: Diagnosis not present

## 2016-11-26 LAB — URINALYSIS, ROUTINE W REFLEX MICROSCOPIC
Bilirubin Urine: NEGATIVE
KETONES UR: 5 mg/dL — AB
NITRITE: NEGATIVE
PROTEIN: 100 mg/dL — AB
Specific Gravity, Urine: 1.011 (ref 1.005–1.030)
pH: 6 (ref 5.0–8.0)

## 2016-11-26 LAB — COMPREHENSIVE METABOLIC PANEL
ALK PHOS: 63 U/L (ref 38–126)
ALT: 15 U/L (ref 14–54)
ANION GAP: 12 (ref 5–15)
AST: 16 U/L (ref 15–41)
Albumin: 3.3 g/dL — ABNORMAL LOW (ref 3.5–5.0)
BILIRUBIN TOTAL: 0.6 mg/dL (ref 0.3–1.2)
BUN: 22 mg/dL — ABNORMAL HIGH (ref 6–20)
CHLORIDE: 109 mmol/L (ref 101–111)
CO2: 17 mmol/L — AB (ref 22–32)
Calcium: 8.8 mg/dL — ABNORMAL LOW (ref 8.9–10.3)
Creatinine, Ser: 7.74 mg/dL — ABNORMAL HIGH (ref 0.44–1.00)
GFR calc Af Amer: 6 mL/min — ABNORMAL LOW (ref >60)
GFR, EST NON AFRICAN AMERICAN: 5 mL/min — AB (ref >60)
GLUCOSE: 149 mg/dL — AB (ref 65–99)
POTASSIUM: 3.3 mmol/L — AB (ref 3.5–5.1)
Sodium: 138 mmol/L (ref 135–145)
Total Protein: 6.8 g/dL (ref 6.5–8.1)

## 2016-11-26 LAB — LIPASE, BLOOD: Lipase: 17 U/L (ref 11–51)

## 2016-11-26 LAB — CBC
HEMATOCRIT: 32.9 % — AB (ref 36.0–46.0)
HEMOGLOBIN: 10.9 g/dL — AB (ref 12.0–15.0)
MCH: 27.5 pg (ref 26.0–34.0)
MCHC: 33.1 g/dL (ref 30.0–36.0)
MCV: 83.1 fL (ref 78.0–100.0)
Platelets: 257 10*3/uL (ref 150–400)
RBC: 3.96 MIL/uL (ref 3.87–5.11)
RDW: 17.8 % — ABNORMAL HIGH (ref 11.5–15.5)
WBC: 9.3 10*3/uL (ref 4.0–10.5)

## 2016-11-26 MED ORDER — ONDANSETRON 4 MG PO TBDP
ORAL_TABLET | ORAL | Status: AC
  Filled 2016-11-26: qty 1

## 2016-11-26 MED ORDER — ONDANSETRON 4 MG PO TBDP
4.0000 mg | ORAL_TABLET | Freq: Once | ORAL | Status: AC | PRN
  Administered 2016-11-26: 4 mg via ORAL

## 2016-11-26 NOTE — ED Triage Notes (Signed)
Pt states that over the past month since she left the hospital you haven't been feeling well, went to [facility] today for lab work and was told to come to the ER by doctor, pt has been vomiting all day and feeling like she is going to pass out, pt states that she recently had kidney failure and had to have dialysis.

## 2016-11-26 NOTE — ED Notes (Signed)
Pt to xray via stretcher

## 2016-11-26 NOTE — ED Notes (Signed)
Pt returned from xray

## 2016-11-27 ENCOUNTER — Inpatient Hospital Stay (HOSPITAL_COMMUNITY): Payer: Medicare HMO

## 2016-11-27 DIAGNOSIS — F320 Major depressive disorder, single episode, mild: Secondary | ICD-10-CM | POA: Diagnosis not present

## 2016-11-27 DIAGNOSIS — K449 Diaphragmatic hernia without obstruction or gangrene: Secondary | ICD-10-CM | POA: Diagnosis present

## 2016-11-27 DIAGNOSIS — Z880 Allergy status to penicillin: Secondary | ICD-10-CM | POA: Diagnosis not present

## 2016-11-27 DIAGNOSIS — Z8249 Family history of ischemic heart disease and other diseases of the circulatory system: Secondary | ICD-10-CM | POA: Diagnosis not present

## 2016-11-27 DIAGNOSIS — Z888 Allergy status to other drugs, medicaments and biological substances status: Secondary | ICD-10-CM | POA: Diagnosis not present

## 2016-11-27 DIAGNOSIS — K219 Gastro-esophageal reflux disease without esophagitis: Secondary | ICD-10-CM | POA: Diagnosis present

## 2016-11-27 DIAGNOSIS — I120 Hypertensive chronic kidney disease with stage 5 chronic kidney disease or end stage renal disease: Secondary | ICD-10-CM | POA: Diagnosis present

## 2016-11-27 DIAGNOSIS — N170 Acute kidney failure with tubular necrosis: Secondary | ICD-10-CM | POA: Diagnosis not present

## 2016-11-27 DIAGNOSIS — I480 Paroxysmal atrial fibrillation: Secondary | ICD-10-CM | POA: Diagnosis present

## 2016-11-27 DIAGNOSIS — N189 Chronic kidney disease, unspecified: Secondary | ICD-10-CM | POA: Diagnosis not present

## 2016-11-27 DIAGNOSIS — E785 Hyperlipidemia, unspecified: Secondary | ICD-10-CM | POA: Diagnosis present

## 2016-11-27 DIAGNOSIS — T380X5A Adverse effect of glucocorticoids and synthetic analogues, initial encounter: Secondary | ICD-10-CM | POA: Diagnosis present

## 2016-11-27 DIAGNOSIS — N12 Tubulo-interstitial nephritis, not specified as acute or chronic: Secondary | ICD-10-CM | POA: Diagnosis present

## 2016-11-27 DIAGNOSIS — N3000 Acute cystitis without hematuria: Secondary | ICD-10-CM | POA: Diagnosis not present

## 2016-11-27 DIAGNOSIS — N2581 Secondary hyperparathyroidism of renal origin: Secondary | ICD-10-CM | POA: Diagnosis present

## 2016-11-27 DIAGNOSIS — Z7902 Long term (current) use of antithrombotics/antiplatelets: Secondary | ICD-10-CM | POA: Diagnosis not present

## 2016-11-27 DIAGNOSIS — N179 Acute kidney failure, unspecified: Secondary | ICD-10-CM | POA: Diagnosis present

## 2016-11-27 DIAGNOSIS — Z8673 Personal history of transient ischemic attack (TIA), and cerebral infarction without residual deficits: Secondary | ICD-10-CM

## 2016-11-27 DIAGNOSIS — N390 Urinary tract infection, site not specified: Secondary | ICD-10-CM | POA: Diagnosis present

## 2016-11-27 DIAGNOSIS — Z823 Family history of stroke: Secondary | ICD-10-CM | POA: Diagnosis not present

## 2016-11-27 DIAGNOSIS — E872 Acidosis: Secondary | ICD-10-CM | POA: Diagnosis present

## 2016-11-27 DIAGNOSIS — N185 Chronic kidney disease, stage 5: Secondary | ICD-10-CM | POA: Diagnosis present

## 2016-11-27 DIAGNOSIS — Z79899 Other long term (current) drug therapy: Secondary | ICD-10-CM | POA: Diagnosis not present

## 2016-11-27 DIAGNOSIS — E039 Hypothyroidism, unspecified: Secondary | ICD-10-CM | POA: Diagnosis present

## 2016-11-27 DIAGNOSIS — F329 Major depressive disorder, single episode, unspecified: Secondary | ICD-10-CM | POA: Diagnosis present

## 2016-11-27 DIAGNOSIS — D638 Anemia in other chronic diseases classified elsewhere: Secondary | ICD-10-CM | POA: Diagnosis present

## 2016-11-27 DIAGNOSIS — N178 Other acute kidney failure: Secondary | ICD-10-CM | POA: Diagnosis not present

## 2016-11-27 DIAGNOSIS — I10 Essential (primary) hypertension: Secondary | ICD-10-CM | POA: Diagnosis not present

## 2016-11-27 DIAGNOSIS — R112 Nausea with vomiting, unspecified: Secondary | ICD-10-CM | POA: Diagnosis present

## 2016-11-27 DIAGNOSIS — Z882 Allergy status to sulfonamides status: Secondary | ICD-10-CM | POA: Diagnosis not present

## 2016-11-27 DIAGNOSIS — R609 Edema, unspecified: Secondary | ICD-10-CM | POA: Diagnosis present

## 2016-11-27 LAB — IRON AND TIBC
Iron: 30 ug/dL (ref 28–170)
SATURATION RATIOS: 11 % (ref 10.4–31.8)
TIBC: 262 ug/dL (ref 250–450)
UIBC: 232 ug/dL

## 2016-11-27 LAB — BASIC METABOLIC PANEL
Anion gap: 13 (ref 5–15)
BUN: 23 mg/dL — ABNORMAL HIGH (ref 6–20)
CALCIUM: 8.3 mg/dL — AB (ref 8.9–10.3)
CO2: 17 mmol/L — AB (ref 22–32)
CREATININE: 7.61 mg/dL — AB (ref 0.44–1.00)
Chloride: 107 mmol/L (ref 101–111)
GFR calc non Af Amer: 5 mL/min — ABNORMAL LOW (ref >60)
GFR, EST AFRICAN AMERICAN: 6 mL/min — AB (ref >60)
Glucose, Bld: 105 mg/dL — ABNORMAL HIGH (ref 65–99)
Potassium: 3.2 mmol/L — ABNORMAL LOW (ref 3.5–5.1)
SODIUM: 137 mmol/L (ref 135–145)

## 2016-11-27 LAB — CBC
HCT: 27.8 % — ABNORMAL LOW (ref 36.0–46.0)
Hemoglobin: 9 g/dL — ABNORMAL LOW (ref 12.0–15.0)
MCH: 26.7 pg (ref 26.0–34.0)
MCHC: 32.4 g/dL (ref 30.0–36.0)
MCV: 82.5 fL (ref 78.0–100.0)
PLATELETS: 194 10*3/uL (ref 150–400)
RBC: 3.37 MIL/uL — AB (ref 3.87–5.11)
RDW: 17.4 % — ABNORMAL HIGH (ref 11.5–15.5)
WBC: 6.9 10*3/uL (ref 4.0–10.5)

## 2016-11-27 LAB — SODIUM, URINE, RANDOM: SODIUM UR: 39 mmol/L

## 2016-11-27 LAB — FERRITIN: FERRITIN: 72 ng/mL (ref 11–307)

## 2016-11-27 LAB — CREATININE, URINE, RANDOM: Creatinine, Urine: 78.21 mg/dL

## 2016-11-27 MED ORDER — FAMOTIDINE 20 MG PO TABS
20.0000 mg | ORAL_TABLET | Freq: Two times a day (BID) | ORAL | Status: DC
  Administered 2016-11-27: 20 mg via ORAL
  Filled 2016-11-27 (×2): qty 1

## 2016-11-27 MED ORDER — METOCLOPRAMIDE HCL 5 MG/5ML PO SOLN: 5.0000 mg | Freq: Three times a day (TID) | ORAL | Status: DC | PRN

## 2016-11-27 MED ORDER — CIPROFLOXACIN HCL 500 MG PO TABS
500.0000 mg | ORAL_TABLET | Freq: Every day | ORAL | Status: DC
  Administered 2016-11-27 – 2016-11-28 (×2): 500 mg via ORAL
  Filled 2016-11-27 (×2): qty 1

## 2016-11-27 MED ORDER — ONDANSETRON 4 MG PO TBDP
4.0000 mg | ORAL_TABLET | Freq: Three times a day (TID) | ORAL | Status: AC | PRN
Start: 2016-11-27 — End: 2016-11-28
  Administered 2016-11-28: 4 mg via ORAL
  Filled 2016-11-27: qty 1

## 2016-11-27 MED ORDER — NEPRO/CARBSTEADY PO LIQD
237.0000 mL | Freq: Two times a day (BID) | ORAL | Status: DC
  Administered 2016-11-28 – 2016-12-03 (×2): 237 mL via ORAL

## 2016-11-27 MED ORDER — HEPARIN SODIUM (PORCINE) 5000 UNIT/ML IJ SOLN
5000.0000 [IU] | Freq: Three times a day (TID) | INTRAMUSCULAR | Status: DC
  Administered 2016-11-27 – 2016-12-04 (×20): 5000 [IU] via SUBCUTANEOUS
  Filled 2016-11-27 (×15): qty 1

## 2016-11-27 MED ORDER — METHYLPREDNISOLONE SODIUM SUCC 125 MG IJ SOLR
60.0000 mg | Freq: Every day | INTRAMUSCULAR | Status: AC
  Administered 2016-11-27 – 2016-12-02 (×6): 60 mg via INTRAVENOUS
  Filled 2016-11-27 (×6): qty 2

## 2016-11-27 MED ORDER — ATORVASTATIN CALCIUM 40 MG PO TABS
40.0000 mg | ORAL_TABLET | Freq: Every day | ORAL | Status: DC
  Administered 2016-11-27 – 2016-12-03 (×7): 40 mg via ORAL
  Filled 2016-11-27 (×7): qty 1

## 2016-11-27 MED ORDER — METOCLOPRAMIDE HCL 5 MG PO TABS
5.0000 mg | ORAL_TABLET | ORAL | Status: DC
  Administered 2016-11-27 – 2016-12-04 (×21): 5 mg via ORAL
  Filled 2016-11-27 (×21): qty 1

## 2016-11-27 MED ORDER — METOCLOPRAMIDE HCL 5 MG/5ML PO SOLN
5.0000 mg | Freq: Three times a day (TID) | ORAL | Status: DC
  Administered 2016-11-27: 5 mg via ORAL
  Filled 2016-11-27 (×3): qty 5

## 2016-11-27 MED ORDER — LEVOTHYROXINE SODIUM 88 MCG PO TABS
88.0000 ug | ORAL_TABLET | Freq: Every day | ORAL | Status: DC
  Administered 2016-11-27 – 2016-12-04 (×8): 88 ug via ORAL
  Filled 2016-11-27 (×8): qty 1

## 2016-11-27 MED ORDER — ACETAMINOPHEN 325 MG PO TABS: 650.0000 mg | ORAL_TABLET | Freq: Four times a day (QID) | ORAL | Status: DC | PRN

## 2016-11-27 MED ORDER — SODIUM CHLORIDE 0.9 % IV SOLN
510.0000 mg | INTRAVENOUS | Status: AC
  Administered 2016-11-27 – 2016-12-01 (×2): 510 mg via INTRAVENOUS
  Filled 2016-11-27 (×3): qty 17

## 2016-11-27 MED ORDER — CLOPIDOGREL BISULFATE 75 MG PO TABS
75.0000 mg | ORAL_TABLET | Freq: Every day | ORAL | Status: DC
  Administered 2016-11-27 – 2016-12-04 (×8): 75 mg via ORAL
  Filled 2016-11-27 (×8): qty 1

## 2016-11-27 MED ORDER — ENSURE ENLIVE PO LIQD: 237.0000 mL | Freq: Two times a day (BID) | ORAL | Status: DC

## 2016-11-27 MED ORDER — POTASSIUM CHLORIDE CRYS ER 20 MEQ PO TBCR
40.0000 meq | EXTENDED_RELEASE_TABLET | Freq: Once | ORAL | Status: AC
  Administered 2016-11-27: 40 meq via ORAL
  Filled 2016-11-27: qty 2

## 2016-11-27 MED ORDER — SODIUM CHLORIDE 0.9% FLUSH
3.0000 mL | Freq: Two times a day (BID) | INTRAVENOUS | Status: DC
  Administered 2016-11-27 – 2016-12-04 (×7): 3 mL via INTRAVENOUS

## 2016-11-27 MED ORDER — SODIUM CHLORIDE 0.9 % IV SOLN
INTRAVENOUS | Status: DC
Start: 2016-11-27 — End: 2016-12-02
  Administered 2016-11-27 – 2016-12-02 (×5): via INTRAVENOUS

## 2016-11-27 MED ORDER — CITALOPRAM HYDROBROMIDE 40 MG PO TABS
40.0000 mg | ORAL_TABLET | Freq: Every day | ORAL | Status: DC
  Administered 2016-11-27 – 2016-11-28 (×2): 40 mg via ORAL
  Filled 2016-11-27 (×2): qty 1

## 2016-11-27 MED ORDER — ACETAMINOPHEN 650 MG RE SUPP: 650.0000 mg | Freq: Four times a day (QID) | RECTAL | Status: DC | PRN

## 2016-11-27 MED ORDER — FAMOTIDINE 20 MG PO TABS
20.0000 mg | ORAL_TABLET | Freq: Every day | ORAL | Status: DC
  Administered 2016-11-28 – 2016-12-04 (×7): 20 mg via ORAL
  Filled 2016-11-27 (×7): qty 1

## 2016-11-27 NOTE — ED Provider Notes (Signed)
[facility] DEPT Provider Note   CSN: [id number] Arrival date & time: 11/26/16  7782     History   Chief Complaint Chief Complaint  Patient presents with  . Back Pain  . Nausea    HPI Rachael [name] is a 61 y.o. female.  HPI Patient presents to the emergency department with generalized weakness and vomiting over the last few weeks.  Patient states that she does have the sensation like she may pass out.  He states that she was the hospital in January and February 1. she was on temporary dialysis due to kidney function issues.  Patient states that nothing seems make the condition better or worse.  She states she did not take any medications prior to arrival for her symptoms. The patient denies chest pain, shortness of breath, headache,blurred vision, neck pain, fever, cough,  numbness, anorexia, edema, abdominal pain,diarrhea, rash, back pain, dysuria, hematemesis, bloody stool or syncope. Past Medical History:  Diagnosis Date  . CHF (congestive heart failure) ([place])    Heart cath in 2006  . CKD (chronic kidney disease)    Baseline creat 1.4-1.7  . CVA (cerebral vascular accident) ([place])    No residual deficit  . HTN (hypertension)   . Hypothyroidism     Patient Active Problem List   Diagnosis Date Noted  . Paroxysmal atrial fibrillation ([place]) 10/30/2016  . Interstitial nephritis   . Hemodialysis catheter infection ([place])   . History of CVA (cerebrovascular accident) 10/24/2016  . Depression 10/24/2016  . Hyperlipidemia 10/24/2016  . Bacteremia 10/23/2016  . Chronic kidney disease (CKD), stage V ([place]) 10/23/2016  . Acute renal failure superimposed on stage 5 chronic kidney disease, not on chronic dialysis ([place]) 09/22/2016  . UTI (urinary tract infection) 09/22/2016  . Nausea and vomiting 09/22/2016  . HTN (hypertension) 09/22/2016  . Hypothyroidism 09/22/2016  . AKI (acute kidney injury) ([place]) 09/22/2016  . Pruritus 05/16/2015  . Urticaria 05/16/2015    Past Surgical  History:  Procedure Laterality Date  . CARDIAC CATHETERIZATION  2006  . CHOLECYSTECTOMY    . IR GENERIC HISTORICAL  09/29/2016   IR FLUORO GUIDE CV LINE RIGHT 09/29/2016 Jacqulynn Cadet, MD [facility]-INTERV RAD  . IR GENERIC HISTORICAL  09/29/2016   IR US GUIDE VASC ACCESS RIGHT [facility]  . TEE WITHOUT CARDIOVERSION N/A 10/27/2016   Procedure: TRANSESOPHAGEAL ECHOCARDIOGRAM (TEE);  Surgeon: Dorothy Spark, MD;  Location: [facility];  Service: Cardiovascular;  Laterality: N/A;    OB History    No data available       Home Medications    Prior to Admission medications   Medication Sig Start Date End Date Taking? Authorizing Provider  atorvastatin (LIPITOR) 40 MG tablet Take 40 mg by mouth daily.    Yes Historical Provider, MD  ciprofloxacin (CIPRO) 250 MG tablet Take 250 mg by mouth 2 (two) times daily. 11/20/16  Yes Historical Provider, MD  citalopram (CELEXA) 40 MG tablet Take 1 tablet (40 mg total) by mouth daily. 11/06/16  Yes Reyne Dumas, MD  clopidogrel (PLAVIX) 75 MG tablet Take 75 mg by mouth daily.   Yes Historical Provider, MD  famotidine (PEPCID) 20 MG tablet Take 1 tablet (20 mg total) by mouth 2 (two) times daily. 10/14/16  Yes Verlee Monte, MD  levothyroxine (SYNTHROID, LEVOTHROID) 88 MCG tablet Take 1 tablet (88 mcg total) by mouth daily before breakfast. 10/31/16  Yes Reyne Dumas, MD  metoprolol tartrate (LOPRESSOR) 25 MG tablet Take 1 tablet (25 mg total) by mouth daily.  Patient not taking: Reported on 11/26/2016 10/30/16 11/29/16  Reyne Dumas, MD  predniSONE (DELTASONE) 20 MG tablet Take 2 tablets (40 mg total) by mouth daily with breakfast. Patient not taking: Reported on 11/26/2016 10/31/16 11/30/16  Reyne Dumas, MD    Family History Family History  Problem Relation Age of Onset  . Hypertension Mother   . Stroke Mother   . Coronary artery disease Mother     Late onset  . Hypertension Father   . Coronary artery disease Father     Late onset  . Hypertension Sister    . Hypertension Brother     Social History Social History  Substance Use Topics  . Smoking status: Never Smoker  . Smokeless tobacco: Never Used  . Alcohol use No     Allergies   Proton pump inhibitors; Protonix [pantoprazole]; Penicillins; and Sulfa antibiotics   Review of Systems Review of Systems All other systems negative except as documented in the HPI. All pertinent positives and negatives as reviewed in the HPI.  Physical Exam Updated Vital Signs BP 109/85   Pulse 83   Temp 98.2 F (36.8 C) (Oral)   Resp (!) 21   Ht 5\' 2"  (1.575 m)   Wt 98.9 kg   SpO2 100%   BMI 39.87 kg/m   Physical Exam  Constitutional: She is oriented to person, place, and time. She appears well-developed and well-nourished. No distress.  HENT:  Head: Normocephalic and atraumatic.  Mouth/Throat: Oropharynx is clear and moist.  Eyes: Pupils are equal, round, and reactive to light.  Neck: Normal range of motion. Neck supple.  Cardiovascular: Normal rate, regular rhythm and normal heart sounds.  Exam reveals no gallop and no friction rub.   No murmur heard. Pulmonary/Chest: Effort normal and breath sounds normal. No respiratory distress. She has no wheezes.  Abdominal: Soft. Bowel sounds are normal. She exhibits no distension. There is no tenderness.  Neurological: She is alert and oriented to person, place, and time. She exhibits normal muscle tone. Coordination normal.  Skin: Skin is warm and dry. Capillary refill takes less than 2 seconds. No rash noted. No erythema.  Psychiatric: She has a normal mood and affect. Her behavior is normal.  Nursing note and vitals reviewed.    ED Treatments / Results  Labs (all labs ordered are listed, but only abnormal results are displayed) Labs Reviewed  COMPREHENSIVE METABOLIC PANEL - Abnormal; Notable for the following:       Result Value   Potassium 3.3 (*)    CO2 17 (*)    Glucose, Bld 149 (*)    BUN 22 (*)    Creatinine, Ser 7.74 (*)     Calcium 8.8 (*)    Albumin 3.3 (*)    GFR calc non Af Amer 5 (*)    GFR calc Af Amer 6 (*)    All other components within normal limits  CBC - Abnormal; Notable for the following:    Hemoglobin 10.9 (*)    HCT 32.9 (*)    RDW 17.8 (*)    All other components within normal limits  URINALYSIS, ROUTINE W REFLEX MICROSCOPIC - Abnormal; Notable for the following:    APPearance CLOUDY (*)    Glucose, UA >=500 (*)    Hgb urine dipstick SMALL (*)    Ketones, ur 5 (*)    Protein, ur 100 (*)    Leukocytes, UA MODERATE (*)    Bacteria, UA RARE (*)    Squamous Epithelial / LPF  0-5 (*)    All other components within normal limits  LIPASE, BLOOD    EKG  EKG Interpretation None       Radiology Dg Chest 2 View  Result Date: 11/26/2016 CLINICAL DATA:  Shortness of breath EXAM: CHEST  2 VIEW COMPARISON:  10/23/2016 FINDINGS: The right lung is grossly clear. There is streaky atelectasis at the left lung base. There is borderline cardiomegaly. No pneumothorax. IMPRESSION: Minimal left basilar atelectasis. Electronically Signed   By: Donavan Foil M.D.   On: 11/26/2016 23:33    Procedures Procedures (including critical care time)  Medications Ordered in ED Medications  ondansetron (ZOFRAN-ODT) disintegrating tablet 4 mg (4 mg Oral Given 11/26/16 1951)     Initial Impression / Assessment and Plan / ED Course  I have reviewed the triage vital signs and the nursing notes.  Pertinent labs & imaging results that were available during my care of the patient were reviewed by me and considered in my medical decision making (see chart for details).     I spoke with the [facility] hospitalist to admit the patient for further evaluation.  I spoke with nephrology who will see the patient in consultation.  Patient has been as a plan and all questions were answered  Final Clinical Impressions(s) / ED Diagnoses   Final diagnoses:  Acute renal failure with other specified pathological  kidney lesion superimposed on chronic kidney disease, unspecified CKD stage (HCC)  Bilious vomiting with nausea    New Prescriptions New Prescriptions   No medications on file     Dalia Heading, PA-C 11/28/16 [place], MD 12/06/16 2255

## 2016-11-27 NOTE — ED Notes (Signed)
No blood draw at this time,  Pt not in room.

## 2016-11-27 NOTE — ED Notes (Signed)
Pt transported via stretcher to u/s.

## 2016-11-27 NOTE — Consult Note (Signed)
Rachael [name] is an 61 y.o. female referred by Dr Charlies Silvers   Chief Complaint: Acute on CKD, CKD secondary to interstitial nephritis HPI: 61yo WF admitted today for N/V and weakness.  Pt has hx CKD 3 (not sure why or what Scr was but dx at least 2 yrs ago by nephrologist in [place]) with acute component secondary to AIN possibly from PPI diagnosed in Jan '18 for which she was treated with prednisone.  She stopped the prednisone on her own at beginning of March as she said it caused her N/V.  She did require HD back in Jan but renal fx improved enough to get off HD when she was  DC 10/14/16.  She was readmitted 10/23/16 due to Dx catheter infection and Scr at that time was 3.5 and at the time of DC 10/26/16 Scr was 3.2.  Scr remained in low 3's thru 10/30/16.  When seen by Dr Hollie Salk on 11/19/16 in the office, Scr was 4.3 but she had stopped her prednisone at least 1 week prior to that visit.  On admission yest Scr was 7.7.  Renal US Rt 7.1, Lt 8 diffusely increased echogenicity, no hydro.  She says she has been able to keep fluids down.  Past Medical History:  Diagnosis Date  . CHF (congestive heart failure) ([place])    Heart cath in 2006  . CKD (chronic kidney disease)    Baseline creat 1.4-1.7  . CVA (cerebral vascular accident) ([place])    No residual deficit  . HTN (hypertension)   . Hypothyroidism     Past Surgical History:  Procedure Laterality Date  . CARDIAC CATHETERIZATION  2006  . CHOLECYSTECTOMY    . IR GENERIC HISTORICAL  09/29/2016   IR FLUORO GUIDE CV LINE RIGHT 09/29/2016 Rachael Cadet, MD [facility]INTER[facility]  . IR GENERIC HISTORICAL  09/29/2016   IR US GUIDE VASC ACCESS RIGHT [facility]  . TEE WITHOUT CARDIOVERSION N/A 10/27/2016   Procedure: TRANSESOPHAGEAL ECHOCARDIOGRAM (TEE);  Surgeon: Dorothy Spark, MD;  Location: [facility] ENDOSCOPY;  Service: Cardiovascular;  Laterality: N/A;    Family History  Problem Relation Age of Onset  . Hypertension Mother   . Stroke Mother   . Coronary artery  disease Mother     Late onset  . Hypertension Father   . Coronary artery disease Father     Late onset  . Hypertension Sister   . Hypertension Brother   No FH renal ds  Social History:  reports that she has never smoked. She has never used smokeless tobacco. She reports that she does not drink alcohol or use drugs. Lives by self in [place]  Allergies:  Allergies  Allergen Reactions  . Proton Pump Inhibitors     Biopsy proven acute interstitial nephritis; required dialysis in 09/2016  . Protonix [Pantoprazole] Anaphylaxis  . Penicillins Rash and Other (See Comments)    Tolerates rocephin  . Sulfa Antibiotics Rash    Medications Prior to Admission  Medication Sig Dispense Refill  . atorvastatin (LIPITOR) 40 MG tablet Take 40 mg by mouth daily.     . ciprofloxacin (CIPRO) 250 MG tablet Take 250 mg by mouth 2 (two) times daily.    . citalopram (CELEXA) 40 MG tablet Take 1 tablet (40 mg total) by mouth daily. 30 tablet 0  . clopidogrel (PLAVIX) 75 MG tablet Take 75 mg by mouth daily.    . famotidine (PEPCID) 20 MG tablet Take 1 tablet (20 mg total) by mouth 2 (two) times daily.  60 tablet 0  . levothyroxine (SYNTHROID, LEVOTHROID) 88 MCG tablet Take 1 tablet (88 mcg total) by mouth daily before breakfast. 30 tablet 1  . metoprolol tartrate (LOPRESSOR) 25 MG tablet Take 1 tablet (25 mg total) by mouth daily. (Patient not taking: Reported on 11/26/2016) 60 tablet 2  . predniSONE (DELTASONE) 20 MG tablet Take 2 tablets (40 mg total) by mouth daily with breakfast. (Patient not taking: Reported on 11/26/2016) 30 tablet 0     Lab Results: UA: 0-5rbc, TNTC WBC's, 100 protein  Recent Labs  11/26/16 1951 11/27/16 0536  WBC 9.3 6.9  HGB 10.9* 9.0*  HCT 32.9* 27.8*  PLT 257 194   BMET  Recent Labs  11/26/16 1951 11/27/16 0536  NA 138 137  K 3.3* 3.2*  CL 109 107  CO2 17* 17*  GLUCOSE 149* 105*  BUN 22* 23*  CREATININE 7.74* 7.61*  CALCIUM 8.8* 8.3*   LFT  Recent Labs   11/26/16 1951  PROT 6.8  ALBUMIN 3.3*  AST 16  ALT 15  ALKPHOS 63  BILITOT 0.6   Dg Chest 2 View  Result Date: 11/26/2016 CLINICAL DATA:  Shortness of breath EXAM: CHEST  2 VIEW COMPARISON:  10/23/2016 FINDINGS: The right lung is grossly clear. There is streaky atelectasis at the left lung base. There is borderline cardiomegaly. No pneumothorax. IMPRESSION: Minimal left basilar atelectasis. Electronically Signed   By: Rachael [name] M.D.   On: 11/26/2016 23:33   US Renal  Result Date: 11/27/2016 CLINICAL DATA:  Acute onset of renal insufficiency. Initial encounter. EXAM: RENAL / URINARY TRACT ULTRASOUND COMPLETE COMPARISON:  Renal ultrasound performed 09/21/2016, and CT of the abdomen and pelvis from 09/19/2016 FINDINGS: Right Kidney: Length: 7.1 cm. Diffusely increased parenchymal echogenicity noted. No mass or hydronephrosis visualized. Left Kidney: Length: 8.0 cm. Diffusely increased parenchymal echogenicity noted. No mass or hydronephrosis visualized. Bladder: Appears normal for degree of bladder distention. IMPRESSION: 1. No evidence of hydronephrosis. 2. Diffusely increased renal parenchymal echogenicity raises concern for medical renal disease. Electronically Signed   By: Garald Balding M.D.   On: 11/27/2016 02:19    ROS: Appetite poor + N/V No SOB No cp No Abd pain No edema Some mild hip pain since starting prednisone No dysuria  PHYSICAL EXAM: Blood pressure (!) 108/45, pulse 89, temperature 98.2 F (36.8 C), temperature source Oral, resp. rate 18, height 5\' 2"  (1.575 m), weight 96.5 kg (212 lb 11.9 oz), SpO2 94 %. HEENT: PERRLA EOMI NECK:No JVD LUNGS:Clear CARDIAC:RRR wo MRG ABD:+ BS NTND No HSM EXT:No edema NEURO:CNI Ox3 No asterixis  Assessment: 1. Acute on CKD 4 vs ESRD.  I am not sure if she will get any recovery but we had a long discussion about ESRD and long term HD which she would do if needed.  Discussed a trial of IV steroids for next few days to see if gets  any recovery before committing to long term HD.  She would like to try another trial of steroids and if no change in renal fx by Sunday then plan permcath and HD Mon 2. Anemia,  Iron studies low as outpt so will give IV iron PLAN: 1. IV solumedrol for now to bypass stomach and see if will tolerate better.  If renal fx does improve then consider trying hydrocortisone PO to see if would tolerate better 2. Check PTH level 3. Check PO4 4. Daily Scr 5. Decrease IV fluids 6. Will start Clip process in case needs long term HD  Rithik Odea T 11/27/2016, 2:03 PM

## 2016-11-27 NOTE — Progress Notes (Signed)
Patient ID: Vanice Rappa Loos, female   DOB: 1956-03-31, 61 y.o.   MRN: [id number]   Patient admitted after midnight. For details please refer to admission note done 11/27/2016.  61 y.o. female with medical history significant for hypertension, hypothyroidism, depression, CVA, chronic kidney disease stage V, interstitial nephritis presumably from Protonix. Patient presented to [facility] with nausea, vomiting and generalized weakness for past 2 weeks prior to this admission. Of note, patient recently diagnosed with UTI and started ciprofloxacin. She noted worsening leg swelling over past few weeks and she also reports she lost about 20 pounds over the last 2 months. In ED, patient was hemodynamically stable. Blood work was notable for hemoglobin of 10.9, potassium 3.3, creatinine 7.74. Previous creatinine on 10/30/2016 was 3. Nephrology was consulted.  Assessment and Plan:  Acute renal failure superimposed on stage 5 chronic kidney disease, not on chronic dialysis (HCC) - Creatinine is up from 3.01 on 10/30/16 to now - Pt has chronic N/V worsening in past 2 weeks, possibly from uremia - Appreciate renal consult and recommendations - Patient has history of interstitial nephritis which was felt to be secondary to Protonix use, which was discontinued - Continue IV fluids for now - Follow up BMP daily   Leisa Lenz [facility] [phone]

## 2016-11-27 NOTE — Progress Notes (Signed)
New Admission Note:  Arrival Method: On stretcher from ED. Mental Orientation: Alert & Oriented x4 Telemetry: Box 27. CCMD verified.  Assessment: Completed Skin: Completed. Refer to flowsheet.  IV: Right AC Pain: 0/10 Tubes: None Safety Measures: Safety Fall Prevention Plan discussed with patient. Admission: Completed [address] [place] Orientation: Patient has been orientated to the room, unit and the staff.  Orders have been reviewed and implemented. Will continue to monitor the patient. Call light has been placed within reach.  Vassie Moselle, RN  Phone Number: [phone]

## 2016-11-27 NOTE — H&P (Signed)
History and Physical    Rachael [name] JYN:[id number] DOB: 1956-07-12 DOA: 11/26/2016  Referring MD/NP/PA:   PCP: Ronita Hipps, MD   Patient coming from:  The patient is coming from home.  At baseline, pt is independent for most of ADL.   Chief Complaint: Nausea, vomiting, generalized weakness  HPI: Rachael [name] is a 61 y.o. female with medical history significant of hypertension, GERD, hypothyroidism, depression, CVA,CKD-V, interstitial nephritis presumably secondary to Protonix, transient atrial fibrillation during hospitalization, who presents for nausea, vomiting and generalized weakness.  Patient states that she has been having nausea, vomiting in the past 2 weeks . She vomited each time when she eats food. She denies diarrhea or abdominal pain. She has generalized weakness, sometimes she feels that she is going to pass out, but did not.  Patient also reports dry cough recently. She denies chest pain, but stating that sometimes she has right lower chest pain due to coughing. Currently no chest pain or SOB. Patient states that she had fever of 100.8 a few days ago, but fever or chills currently.  Patient is being treated for UTI with cirpro since Friday by renal doctor, Dr. Hollie Salk. She denies dysuria, burning on urination or increased urinary frequency. She states that she has increased her leg edema. She lost 20 pounds since January.   ED Course: pt was found to have WBC 9.3, lipase is 17, urinalysis with moderate amount of leukocytes, but contains 0-5 squamous cells, potassium 3.3, bicarbonate is 17, creatinine 7.74 (previous creatinine is 3.0Y on 10/30/16). Chest x-ray showed mild left basilar atelectasis. Patient is admitted to telemetry bed as inpatient. Renal, Dr. Augustin Coupe was consulted by EDP.  Review of Systems:   General: had fevers, chills, has poor appetite, has fatigue HEENT: no blurry vision, hearing changes or sore throat Respiratory:  Has cough, no SOB or wheezing CV: no chest  pain, no palpitations GI: has nausea, vomiting, no abdominal pain, diarrhea, constipation GU: no dysuria, burning on urination, increased urinary frequency, hematuria  Ext: has leg edema Neuro: no unilateral weakness, numbness, or tingling, no vision change or hearing loss Skin: no rash, no skin tear. MSK: No muscle spasm, no deformity, no limitation of range of movement in spin Heme: No easy bruising.  Travel history: No recent long distant travel.  Allergy:  Allergies  Allergen Reactions  . Proton Pump Inhibitors     Biopsy proven acute interstitial nephritis; required dialysis in 09/2016  . Protonix [Pantoprazole] Anaphylaxis  . Penicillins Rash and Other (See Comments)    Tolerates rocephin  . Sulfa Antibiotics Rash    Past Medical History:  Diagnosis Date  . CHF (congestive heart failure) ([place])    Heart cath in 2006  . CKD (chronic kidney disease)    Baseline creat 1.4-1.7  . CVA (cerebral vascular accident) ([place])    No residual deficit  . HTN (hypertension)   . Hypothyroidism     Past Surgical History:  Procedure Laterality Date  . CARDIAC CATHETERIZATION  2006  . CHOLECYSTECTOMY    . IR GENERIC HISTORICAL  09/29/2016   IR FLUORO GUIDE CV LINE RIGHT 09/29/2016 Jacqulynn Cadet, MD [facility]  . IR GENERIC HISTORICAL  09/29/2016   IR US GUIDE VASC ACCESS RIGHT [facility]  . TEE WITHOUT CARDIOVERSION N/A 10/27/2016   Procedure: TRANSESOPHAGEAL ECHOCARDIOGRAM (TEE);  Surgeon: Dorothy Spark, MD;  Location: [facility];  Service: Cardiovascular;  Laterality: N/A;    Social History:  reports that she has never smoked.  She has never used smokeless tobacco. She reports that she does not drink alcohol or use drugs.  Family History:  Family History  Problem Relation Age of Onset  . Hypertension Mother   . Stroke Mother   . Coronary artery disease Mother     Late onset  . Hypertension Father   . Coronary artery disease Father     Late onset  . Hypertension  Sister   . Hypertension Brother      Prior to Admission medications   Medication Sig Start Date End Date Taking? Authorizing Provider  atorvastatin (LIPITOR) 40 MG tablet Take 40 mg by mouth daily.    Yes Historical Provider, MD  ciprofloxacin (CIPRO) 250 MG tablet Take 250 mg by mouth 2 (two) times daily. 11/20/16  Yes Historical Provider, MD  citalopram (CELEXA) 40 MG tablet Take 1 tablet (40 mg total) by mouth daily. 11/06/16  Yes Reyne Dumas, MD  clopidogrel (PLAVIX) 75 MG tablet Take 75 mg by mouth daily.   Yes Historical Provider, MD  famotidine (PEPCID) 20 MG tablet Take 1 tablet (20 mg total) by mouth 2 (two) times daily. 10/14/16  Yes Verlee Monte, MD  levothyroxine (SYNTHROID, LEVOTHROID) 88 MCG tablet Take 1 tablet (88 mcg total) by mouth daily before breakfast. 10/31/16  Yes Reyne Dumas, MD  metoprolol tartrate (LOPRESSOR) 25 MG tablet Take 1 tablet (25 mg total) by mouth daily. Patient not taking: Reported on 11/26/2016 10/30/16 11/29/16  Reyne Dumas, MD  predniSONE (DELTASONE) 20 MG tablet Take 2 tablets (40 mg total) by mouth daily with breakfast. Patient not taking: Reported on 11/26/2016 10/31/16 11/30/16  Reyne Dumas, MD    Physical Exam: Vitals:   11/26/16 2253 11/26/16 2300 11/27/16 0000 11/27/16 0030  BP:  111/74 (!) 141/83 109/85  Pulse:  86 87 83  Resp:  (!) 21 20 (!) 21  Temp: 98.2 F (36.8 C)     TempSrc: Oral     SpO2:  99% 99% 100%  Weight:      Height:       General: Not in acute distress HEENT:       Eyes: PERRL, EOMI, no scleral icterus.       ENT: No discharge from the ears and nose, no pharynx injection, no tonsillar enlargement.        Neck: No JVD, no bruit, no mass felt. Heme: No neck lymph node enlargement. Cardiac: S1/S2, RRR, No murmurs, No gallops or rubs. Respiratory: No rales, wheezing, rhonchi or rubs. GI: Soft, nondistended, nontender, no rebound pain, no organomegaly, BS present. GU: No hematuria Ext: has trace leg edema bilaterally.  2+DP/PT pulse bilaterally. Musculoskeletal: No joint deformities, No joint redness or warmth, no limitation of ROM in spin. Skin: No rashes.  Neuro: Alert, oriented X3, cranial nerves II-XII grossly intact, moves all extremities normally.  Psych: Patient is not psychotic, no suicidal or hemocidal ideation.  Labs on Admission: I have personally reviewed following labs and imaging studies  CBC:  Recent Labs Lab 11/26/16 1951  WBC 9.3  HGB 10.9*  HCT 32.9*  MCV 83.1  PLT 144   Basic Metabolic Panel:  Recent Labs Lab 11/26/16 1951  NA 138  K 3.3*  CL 109  CO2 17*  GLUCOSE 149*  BUN 22*  CREATININE 7.74*  CALCIUM 8.8*   GFR: Estimated Creatinine Clearance: 8.3 mL/min (A) (by C-G formula based on SCr of 7.74 mg/dL (H)). Liver Function Tests:  Recent Labs Lab 11/26/16 1951  AST 16  ALT  15  ALKPHOS 63  BILITOT 0.6  PROT 6.8  ALBUMIN 3.3*    Recent Labs Lab 11/26/16 1951  LIPASE 17   No results for input(s): AMMONIA in the last 168 hours. Coagulation Profile: No results for input(s): INR, PROTIME in the last 168 hours. Cardiac Enzymes: No results for input(s): CKTOTAL, CKMB, CKMBINDEX, TROPONINI in the last 168 hours. BNP (last 3 results) No results for input(s): PROBNP in the last 8760 hours. HbA1C: No results for input(s): HGBA1C in the last 72 hours. CBG: No results for input(s): GLUCAP in the last 168 hours. Lipid Profile: No results for input(s): CHOL, HDL, LDLCALC, TRIG, CHOLHDL, LDLDIRECT in the last 72 hours. Thyroid Function Tests: No results for input(s): TSH, T4TOTAL, FREET4, T3FREE, THYROIDAB in the last 72 hours. Anemia Panel: No results for input(s): VITAMINB12, FOLATE, FERRITIN, TIBC, IRON, RETICCTPCT in the last 72 hours. Urine analysis:    Component Value Date/Time   COLORURINE YELLOW 11/26/2016 2008   APPEARANCEUR CLOUDY (A) 11/26/2016 2008   LABSPEC 1.011 11/26/2016 2008   PHURINE 6.0 11/26/2016 2008   GLUCOSEU >=500 (A)  11/26/2016 2008   HGBUR SMALL (A) 11/26/2016 2008   BILIRUBINUR NEGATIVE 11/26/2016 2008   KETONESUR 5 (A) 11/26/2016 2008   PROTEINUR 100 (A) 11/26/2016 2008   NITRITE NEGATIVE 11/26/2016 2008   LEUKOCYTESUR MODERATE (A) 11/26/2016 2008   Sepsis Labs: @LABRCNTIP (procalcitonin:4,lacticidven:4) )No results found for this or any previous visit (from the past 240 hour(s)).   Radiological Exams on Admission: Dg Chest 2 View  Result Date: 11/26/2016 CLINICAL DATA:  Shortness of breath EXAM: CHEST  2 VIEW COMPARISON:  10/23/2016 FINDINGS: The right lung is grossly clear. There is streaky atelectasis at the left lung base. There is borderline cardiomegaly. No pneumothorax. IMPRESSION: Minimal left basilar atelectasis. Electronically Signed   By: Donavan Foil M.D.   On: 11/26/2016 23:33     EKG: Independently reviewed.  QTC 457, low voltage, nonspecific T-wave change  Assessment/Plan Principal Problem:   Acute renal failure superimposed on stage 5 chronic kidney disease, not on chronic dialysis (HCC) Active Problems:   UTI (urinary tract infection)   Nausea and vomiting   HTN (hypertension)   Hypothyroidism   History of CVA (cerebrovascular accident)   Depression   Paroxysmal atrial fibrillation (HCC)   Acute renal failure superimposed on stage 5 chronic kidney disease, not on chronic dialysis [facility]): Creatinine is up from 3.01 on 10/30/16-->7.74. Etiology is not clear. Patient has chronic ongoing nausea and vomiting, which has worsened in the past 2 weeks, may have caused dehydration, but the ratio for BUN 22/creatinine 7.74 is not consistent with prerenal failure. Patient has history of interstitial nephritis which was felt to be secondary to Protonix use, which was discontinued.   -will admit to tele bed as inpt -renal, dr. Augustin Coupe was consulted -check FeNA -get renal US -gentle IVF: 75 cc/h of NS  Recurrent nausea and vomiting: Etiology is not clear. CT scan done in [facility]  on 09/19/2016 shows no acute abnormalities. Has hiatal hernia and history of GERD that this could be the culprit. No Diarrhea or abdominal pain. -Pepcid - Reglan 5 mg tid and prn zofran - IVF as above  UTI (urinary tract infection): pt is on cipro since Friday. No symptoms. -Continue Cipro -Urine culture and blood culture  HTN: -continue metoprolol  GERD: -Pepcid  Hypothyroidism: Last TSH was 1.08 on 10/27/16 -Continue home Synthroid  History of CVA (cerebrovascular accident): -continue plavix and Lipitor  Depression: Stable, no  suicidal or homicidal ideations. -Continue home medications: Citalopram  HLD -lipitor    DVT ppx: SQ Heparin Code Status: Full code Family Communication:  Yes, patient's frined at bed side Disposition Plan:  Anticipate discharge back to previous home environment Consults called:  Renal, Dr. Augustin Coupe Admission status: Inpatient/tele   Date of Service 11/27/2016    Ivor Costa [facility] Pager [phone]  If 7PM-7AM, please contact night-coverage [email or web address] Password [email or web address] 11/27/2016, 1:13 AM

## 2016-11-27 NOTE — Progress Notes (Signed)
Initial Nutrition Assessment  DOCUMENTATION CODES:   Obesity unspecified  INTERVENTION:  Discontinue Ensure.   Provide Nepro Shake po BID, each supplement provides 425 kcal and 19 grams protein  Encourage adequate PO intake.   NUTRITION DIAGNOSIS:   Inadequate oral intake related to poor appetite as evidenced by meal completion < 50%.  GOAL:   Patient will meet greater than or equal to 90% of their needs  MONITOR:   PO intake, Supplement acceptance, Labs, Weight trends, Skin, I & O's  REASON FOR ASSESSMENT:   Malnutrition Screening Tool    ASSESSMENT:   61 y.o. female with medical history significant for hypertension, hypothyroidism, depression, CVA, chronic kidney disease stage V, interstitial nephritis presumably from Protonix. Patient [facility] with nausea, vomiting and generalized weakness for past 2 weeks prior to this admission.  Meal completion 25% this AM. Pt reports no abdominal discomfort post prandial. Pt reports having a decreased appetite however was able to consume at least 2-3 meals a day PTA. Pt does report a 20 lb weight loss over the past 2 months. Pt with a reported 8.6% weight loss in 2 months. Pt currently has Ensure ordered and is agreeable to switching the supplement to Nepro Shake instead as it is lesser in potassium and phosphorous when compared to Ensure. RD to modify orders.   Pt with no observed significant fat or muscle mass loss.   Labs and medications reviewed.   Diet Order:  Diet Heart Room service appropriate? Yes; Fluid consistency: Thin  Skin:  Reviewed, no issues  Last BM:  3/21  Height:   Ht Readings from Last 1 Encounters:  11/27/16 5\' 2"  (1.575 m)    Weight:   Wt Readings from Last 1 Encounters:  11/27/16 212 lb 11.9 oz (96.5 kg)    Ideal Body Weight:  50 kg  BMI:  Body mass index is 38.91 kg/m.  Estimated Nutritional Needs:   Kcal:  1700-1900  Protein:  90-100 grams  Fluid:  Per MD  EDUCATION  NEEDS:   No education needs identified at this time  Corrin Parker, MS, RD, LDN Pager # [phone] After hours/ weekend pager # [phone]

## 2016-11-28 DIAGNOSIS — N170 Acute kidney failure with tubular necrosis: Secondary | ICD-10-CM

## 2016-11-28 DIAGNOSIS — R112 Nausea with vomiting, unspecified: Secondary | ICD-10-CM

## 2016-11-28 DIAGNOSIS — N185 Chronic kidney disease, stage 5: Secondary | ICD-10-CM

## 2016-11-28 DIAGNOSIS — I480 Paroxysmal atrial fibrillation: Secondary | ICD-10-CM

## 2016-11-28 DIAGNOSIS — E039 Hypothyroidism, unspecified: Secondary | ICD-10-CM

## 2016-11-28 LAB — RENAL FUNCTION PANEL
ALBUMIN: 2.5 g/dL — AB (ref 3.5–5.0)
ANION GAP: 13 (ref 5–15)
BUN: 28 mg/dL — ABNORMAL HIGH (ref 6–20)
CO2: 14 mmol/L — ABNORMAL LOW (ref 22–32)
Calcium: 8 mg/dL — ABNORMAL LOW (ref 8.9–10.3)
Chloride: 111 mmol/L (ref 101–111)
Creatinine, Ser: 7.56 mg/dL — ABNORMAL HIGH (ref 0.44–1.00)
GFR calc Af Amer: 6 mL/min — ABNORMAL LOW (ref >60)
GFR, EST NON AFRICAN AMERICAN: 5 mL/min — AB (ref >60)
Glucose, Bld: 170 mg/dL — ABNORMAL HIGH (ref 65–99)
PHOSPHORUS: 3.6 mg/dL (ref 2.5–4.6)
POTASSIUM: 4.3 mmol/L (ref 3.5–5.1)
Sodium: 138 mmol/L (ref 135–145)

## 2016-11-28 LAB — URINE CULTURE

## 2016-11-28 MED ORDER — ONDANSETRON 4 MG PO TBDP
4.0000 mg | ORAL_TABLET | Freq: Three times a day (TID) | ORAL | Status: DC | PRN
  Filled 2016-11-28: qty 1

## 2016-11-28 MED ORDER — PROMETHAZINE HCL 25 MG PO TABS
12.5000 mg | ORAL_TABLET | Freq: Four times a day (QID) | ORAL | Status: DC | PRN
  Administered 2016-11-28: 12.5 mg via ORAL
  Filled 2016-11-28: qty 1

## 2016-11-28 MED ORDER — CITALOPRAM HYDROBROMIDE 20 MG PO TABS
20.0000 mg | ORAL_TABLET | Freq: Every day | ORAL | Status: DC
  Administered 2016-11-29 – 2016-12-04 (×6): 20 mg via ORAL
  Filled 2016-11-28 (×6): qty 1

## 2016-11-28 NOTE — Progress Notes (Addendum)
PROGRESS NOTE  Baker Kogler Lueras  UXL:[id number] DOB: November 03, 1955 DOA: 11/26/2016 PCP: Ronita Hipps, MD   Brief Narrative: Rachael [name] is a 61 y.o. female with a history of HTN, GERD, hypothyroidism, depression, CVA, PAF, CKD stage IV and biopsy-proven interstitial nephritis thought to be due to protonix who presented with nausea, vomiting, leg swelling and generalized weakness found to be in acute renal failure. She had been taking prednisone for IN Dx Jan 2018 which initially required HD, but stopped earlier this month due to nausea and vomiting.   In ED, patient was hemodynamically stable. Blood work was notable for hemoglobin of 10.9, potassium 3.3, creatinine 7.74. Renal U/S showed 7.1cm (R) and 8cm (L) with diffusely increased echogenicity without hydronephrosis. Nephrology was consulted and recommended trial of solumedrol, with plan to insert [facility] institute long term HD if no renal recovery. Otherwise, may discharge on oral hydrocortisone.   Assessment & Plan: Principal Problem:   Acute renal failure superimposed on stage 5 chronic kidney disease, not on chronic dialysis (HCC) Active Problems:   UTI (urinary tract infection)   Nausea and vomiting   HTN (hypertension)   Hypothyroidism   History of CVA (cerebrovascular accident)   Depression   Paroxysmal atrial fibrillation (HCC)  Acute renal failure on CKD stage IV vs. ESRD with interstitial nephritis: Creatinine up 3 > 7.7 on admission with discontinuation of prednisone for AIN (biopsy-proven). - Daily RFP, no indications for urgent HD, making urine.  - Continue IV solumedrol to see if AIN can be treated and avoid need for HD.  - Of course, PPI is on allergy list. - [facility] 3/26 and HD if no improvement.  - Clip process has been started per nephrology - Iron panel consistent with AOCD, monitor hgb. Nephrology providing ESA, iron.  Recurrent nausea and vomiting: Possibly due to uremia. GERD/hiatal hernia posibly contributing since off  PPI.  - Pepcid - Continue reglan 5 mg tid and prn zofran - IVF at modest rate until po intake picks up  Asymptomatic bacteriuria: Has been taking cipro 3/16 - 3/23, will stop this as UCx was nonclonal.  - Monitor for symptoms. - DC'ed cipro  HTN: Chronic, stable. normotensive without medications.  - Monitor  Hypothyroidism: Last TSH was 1.08 on 10/27/16 - Continue home synthroid  History of CVA: Chronic, stable.  -continue plavix and Lipitor  Depression: Chronic, stable. No suicidal or homicidal ideations.  Continue home medications: Citalopram, decrease to 20mg  daily (max dose in this age)  HLD: Chronic, stable.  - Continue lipitor  DVT prophylaxis: Heparin Code Status: Full Family Communication: None at bedside Disposition Plan: Anticipate DC to home early next week.  Consultants:   Nephrology, Dr. Moshe Cipro  Procedures:   None  Antimicrobials:  None   Subjective: Pt still nauseated without emesis. Tolerating food. Urinating "normally." No other complaints including dyspnea, chest pain.   Objective: Vitals:   11/27/16 1833 11/27/16 2120 11/28/16 0520 11/28/16 0805  BP: 128/70 113/72 128/71 109/61  Pulse: 82 81 83 82  Resp: 16 17 17 16   Temp: 98 F (36.7 C) 98.4 F (36.9 C) 98.4 F (36.9 C) 98.3 F (36.8 C)  TempSrc: Oral Oral Oral Oral  SpO2: 95% 97% 95% 93%  Weight:  97.2 kg (214 lb 4.6 oz)    Height:        Intake/Output Summary (Last 24 hours) at 11/28/16 1403 Last data filed at 11/28/16 0601  Gross per 24 hour  Intake  1739.08 ml  Output             1000 ml  Net           739.08 ml   Filed Weights   11/26/16 1937 11/27/16 0500 11/27/16 2120  Weight: 98.9 kg (218 lb) 96.5 kg (212 lb 11.9 oz) 97.2 kg (214 lb 4.6 oz)    Examination: General exam: 61 y.o. female in no distress Respiratory system: Non-labored breathing room air. Diminished with scant crackles transiently heard on left base, cleared.  Cardiovascular system:  Regular rate and rhythm. No murmur, rub, or gallop. No JVD, and trace pedal edema. Gastrointestinal system: Abdomen soft, non-tender, non-distended, with normoactive bowel sounds. No organomegaly or masses felt. Central nervous system: Alert and oriented. No focal neurological deficits. No asterixis. Extremities: Warm, no deformities Skin: No rashes, lesions no ulcers Psychiatry: Judgement and insight appear normal. Mood & affect appropriate.   Data Reviewed: I have personally reviewed following labs and imaging studies  CBC:  Recent Labs Lab 11/26/16 1951 11/27/16 0536  WBC 9.3 6.9  HGB 10.9* 9.0*  HCT 32.9* 27.8*  MCV 83.1 82.5  PLT 257 397   Basic Metabolic Panel:  Recent Labs Lab 11/26/16 1951 11/27/16 0536 11/28/16 0321  NA 138 137 138  K 3.3* 3.2* 4.3  CL 109 107 111  CO2 17* 17* 14*  GLUCOSE 149* 105* 170*  BUN 22* 23* 28*  CREATININE 7.74* 7.61* 7.56*  CALCIUM 8.8* 8.3* 8.0*  PHOS  --   --  3.6   GFR: Estimated Creatinine Clearance: 8.4 mL/min (A) (by C-G formula based on SCr of 7.56 mg/dL (H)). Liver Function Tests:  Recent Labs Lab 11/26/16 1951 11/28/16 0321  AST 16  --   ALT 15  --   ALKPHOS 63  --   BILITOT 0.6  --   PROT 6.8  --   ALBUMIN 3.3* 2.5*    Recent Labs Lab 11/26/16 1951  LIPASE 17   No results for input(s): AMMONIA in the last 168 hours. Coagulation Profile: No results for input(s): INR, PROTIME in the last 168 hours. Cardiac Enzymes: No results for input(s): CKTOTAL, CKMB, CKMBINDEX, TROPONINI in the last 168 hours. BNP (last 3 results) No results for input(s): PROBNP in the last 8760 hours. HbA1C: No results for input(s): HGBA1C in the last 72 hours. CBG: No results for input(s): GLUCAP in the last 168 hours. Lipid Profile: No results for input(s): CHOL, HDL, LDLCALC, TRIG, CHOLHDL, LDLDIRECT in the last 72 hours. Thyroid Function Tests: No results for input(s): TSH, T4TOTAL, FREET4, T3FREE, THYROIDAB in the last 72  hours. Anemia Panel:  Recent Labs  11/27/16 1516  FERRITIN 72  TIBC 262  IRON 30   Urine analysis:    Component Value Date/Time   COLORURINE YELLOW 11/26/2016 2008   APPEARANCEUR CLOUDY (A) 11/26/2016 2008   LABSPEC 1.011 11/26/2016 2008   PHURINE 6.0 11/26/2016 2008   GLUCOSEU >=500 (A) 11/26/2016 2008   HGBUR SMALL (A) 11/26/2016 2008   BILIRUBINUR NEGATIVE 11/26/2016 2008   KETONESUR 5 (A) 11/26/2016 2008   PROTEINUR 100 (A) 11/26/2016 2008   NITRITE NEGATIVE 11/26/2016 2008   LEUKOCYTESUR MODERATE (A) 11/26/2016 2008   Recent Results (from the past 240 hour(s))  Urine culture     Status: Abnormal   Collection Time: 11/27/16 12:55 AM  Result Value Ref Range Status   Specimen Description URINE, CLEAN CATCH  Final   Special Requests NONE  Final   Culture MULTIPLE SPECIES  PRESENT, SUGGEST RECOLLECTION (A)  Final   Report Status 11/28/2016 FINAL  Final  Culture, blood (Routine X 2) w Reflex to ID Panel     Status: None (Preliminary result)   Collection Time: 11/27/16  2:32 AM  Result Value Ref Range Status   Specimen Description BLOOD RIGHT ARM  Final   Special Requests BOTTLES DRAWN AEROBIC AND ANAEROBIC 5ML  Final   Culture NO GROWTH 1 DAY  Final   Report Status PENDING  Incomplete  Culture, blood (Routine X 2) w Reflex to ID Panel     Status: None (Preliminary result)   Collection Time: 11/27/16  2:35 AM  Result Value Ref Range Status   Specimen Description BLOOD LEFT ARM  Final   Special Requests BOTTLES DRAWN AEROBIC AND ANAEROBIC 5ML  Final   Culture NO GROWTH 1 DAY  Final   Report Status PENDING  Incomplete      Radiology Studies: Dg Chest 2 View  Result Date: 11/26/2016 CLINICAL DATA:  Shortness of breath EXAM: CHEST  2 VIEW COMPARISON:  10/23/2016 FINDINGS: The right lung is grossly clear. There is streaky atelectasis at the left lung base. There is borderline cardiomegaly. No pneumothorax. IMPRESSION: Minimal left basilar atelectasis. Electronically Signed    By: Donavan Foil M.D.   On: 11/26/2016 23:33   US Renal  Result Date: 11/27/2016 CLINICAL DATA:  Acute onset of renal insufficiency. Initial encounter. EXAM: RENAL / URINARY TRACT ULTRASOUND COMPLETE COMPARISON:  Renal ultrasound performed 09/21/2016, and CT of the abdomen and pelvis from 09/19/2016 FINDINGS: Right Kidney: Length: 7.1 cm. Diffusely increased parenchymal echogenicity noted. No mass or hydronephrosis visualized. Left Kidney: Length: 8.0 cm. Diffusely increased parenchymal echogenicity noted. No mass or hydronephrosis visualized. Bladder: Appears normal for degree of bladder distention. IMPRESSION: 1. No evidence of hydronephrosis. 2. Diffusely increased renal parenchymal echogenicity raises concern for medical renal disease. Electronically Signed   By: Garald Balding M.D.   On: 11/27/2016 02:19    Scheduled Meds: . atorvastatin  40 mg Oral q1800  . ciprofloxacin  500 mg Oral Daily  . citalopram  40 mg Oral Daily  . clopidogrel  75 mg Oral Daily  . famotidine  20 mg Oral Daily  . feeding supplement (NEPRO CARB STEADY)  237 mL Oral BID BM  . ferumoxytol  510 mg Intravenous Once every 4 days  . heparin  5,000 Units Subcutaneous Q8H  . levothyroxine  88 mcg Oral QAC breakfast  . methylPREDNISolone (SOLU-MEDROL) injection  60 mg Intravenous Daily  . metoCLOPramide  5 mg Oral [id number]  . sodium chloride flush  3 mL Intravenous Q12H   Continuous Infusions: . sodium chloride 50 mL/hr at 11/27/16 1542     LOS: 1 day   Time spent: 25 minutes.  Vance Gather, MD [facility] Pager [phone]  If 7PM-7AM, please contact night-coverage [email or web address] Password [facility] 11/28/2016, 2:03 PM

## 2016-11-28 NOTE — Progress Notes (Signed)
Subjective:  UOP adequate- creatinine stable- no new complaints- nauseated but when necessary's help Objective Vital signs in last 24 hours: Vitals:   11/27/16 1833 11/27/16 2120 11/28/16 0520 11/28/16 0805  BP: 128/70 113/72 128/71 109/61  Pulse: 82 81 83 82  Resp: 16 17 17 16   Temp: 98 F (36.7 C) 98.4 F (36.9 C) 98.4 F (36.9 C) 98.3 F (36.8 C)  TempSrc: Oral Oral Oral Oral  SpO2: 95% 97% 95% 93%  Weight:  97.2 kg (214 lb 4.6 oz)    Height:       Weight change: -1.684 kg (-3 lb 11.4 oz)  Intake/Output Summary (Last 24 hours) at 11/28/16 1308 Last data filed at 11/28/16 0601  Gross per 24 hour  Intake          1919.08 ml  Output             1000 ml  Net           919.08 ml    Assessment/ Plan: Pt is [name] 61 y.o. yo female who was admitted on 11/26/2016 with N/V and creatinine above her recent baseline  Assessment/Plan: 1. Renal- has biopsy Proven AIN that initially responded to steroids with [name] nadir creatinine of 3.2. She now has suffered acute on chronic renal failure but in the setting of abruptly discontinuing her steroids. Plan is for trial of steroids again to see if kidney function will improve but if not then patient may require dialysis 2. HTN/vol- fluid retention secondary to steroids but blood pressure is excellent on no meds 3. Anemia- iron is being repleted- will give dose of ESA as well 4. Secondary hyperparathyroidism- last PTH was 111- phos is 3.6- no bone meds on board   Rachael [name]    Labs: Basic Metabolic Panel:  Recent Labs Lab 11/26/16 1951 11/27/16 0536 11/28/16 0321  NA 138 137 138  K 3.3* 3.2* 4.3  CL 109 107 111  CO2 17* 17* 14*  GLUCOSE 149* 105* 170*  BUN 22* 23* 28*  CREATININE 7.74* 7.61* 7.56*  CALCIUM 8.8* 8.3* 8.0*  PHOS  --   --  3.6   Liver Function Tests:  Recent Labs Lab 11/26/16 1951 11/28/16 0321  AST 16  --   ALT 15  --   ALKPHOS 63  --   BILITOT 0.6  --   PROT 6.8  --   ALBUMIN 3.3* 2.5*    Recent  Labs Lab 11/26/16 1951  LIPASE 17   No results for input(s): AMMONIA in the last 168 hours. CBC:  Recent Labs Lab 11/26/16 1951 11/27/16 0536  WBC 9.3 6.9  HGB 10.9* 9.0*  HCT 32.9* 27.8*  MCV 83.1 82.5  PLT 257 194   Cardiac Enzymes: No results for input(s): CKTOTAL, CKMB, CKMBINDEX, TROPONINI in the last 168 hours. CBG: No results for input(s): GLUCAP in the last 168 hours.  Iron Studies:  Recent Labs  11/27/16 1516  IRON 30  TIBC 262  FERRITIN 72   Studies/Results: Dg Chest 2 View  Result Date: 11/26/2016 CLINICAL DATA:  Shortness of breath EXAM: CHEST  2 VIEW COMPARISON:  10/23/2016 FINDINGS: The right lung is grossly clear. There is streaky atelectasis at the left lung base. There is borderline cardiomegaly. No pneumothorax. IMPRESSION: Minimal left basilar atelectasis. Electronically Signed   By: Donavan Foil M.D.   On: 11/26/2016 23:33   US Renal  Result Date: 11/27/2016 CLINICAL DATA:  Acute onset of renal insufficiency. Initial encounter. EXAM: RENAL /  URINARY TRACT ULTRASOUND COMPLETE COMPARISON:  Renal ultrasound performed 09/21/2016, and CT of the abdomen and pelvis from 09/19/2016 FINDINGS: Right Kidney: Length: 7.1 cm. Diffusely increased parenchymal echogenicity noted. No mass or hydronephrosis visualized. Left Kidney: Length: 8.0 cm. Diffusely increased parenchymal echogenicity noted. No mass or hydronephrosis visualized. Bladder: Appears normal for degree of bladder distention. IMPRESSION: 1. No evidence of hydronephrosis. 2. Diffusely increased renal parenchymal echogenicity raises concern for medical renal disease. Electronically Signed   By: Garald Balding M.D.   On: 11/27/2016 02:19   Medications: Infusions: . sodium chloride 50 mL/hr at 11/27/16 1542    Scheduled Medications: . atorvastatin  40 mg Oral q1800  . ciprofloxacin  500 mg Oral Daily  . citalopram  40 mg Oral Daily  . clopidogrel  75 mg Oral Daily  . famotidine  20 mg Oral Daily  .  feeding supplement (NEPRO CARB STEADY)  237 mL Oral BID BM  . ferumoxytol  510 mg Intravenous Once every 4 days  . heparin  5,000 Units Subcutaneous Q8H  . levothyroxine  88 mcg Oral QAC breakfast  . methylPREDNISolone (SOLU-MEDROL) injection  60 mg Intravenous Daily  . metoCLOPramide  5 mg Oral [id number]  . sodium chloride flush  3 mL Intravenous Q12H    have reviewed scheduled and prn medications.  Physical Exam: General: Obese, lying in bed in no acute distress Heart: Regular rate and rhythm  Lungs: Mostly clear  Abdomen: Obese, soft, nontender  Extremities: Minimal edema     11/28/2016,1:08 PM  LOS: 1 day

## 2016-11-29 DIAGNOSIS — I10 Essential (primary) hypertension: Secondary | ICD-10-CM

## 2016-11-29 DIAGNOSIS — N178 Other acute kidney failure: Secondary | ICD-10-CM

## 2016-11-29 DIAGNOSIS — N189 Chronic kidney disease, unspecified: Secondary | ICD-10-CM

## 2016-11-29 LAB — RENAL FUNCTION PANEL
ANION GAP: 10 (ref 5–15)
Albumin: 2.7 g/dL — ABNORMAL LOW (ref 3.5–5.0)
BUN: 36 mg/dL — ABNORMAL HIGH (ref 6–20)
CALCIUM: 8.2 mg/dL — AB (ref 8.9–10.3)
CHLORIDE: 117 mmol/L — AB (ref 101–111)
CO2: 14 mmol/L — ABNORMAL LOW (ref 22–32)
CREATININE: 7.29 mg/dL — AB (ref 0.44–1.00)
GFR calc Af Amer: 6 mL/min — ABNORMAL LOW (ref >60)
GFR calc non Af Amer: 5 mL/min — ABNORMAL LOW (ref >60)
Glucose, Bld: 94 mg/dL (ref 65–99)
PHOSPHORUS: 5 mg/dL — AB (ref 2.5–4.6)
POTASSIUM: 3.9 mmol/L (ref 3.5–5.1)
SODIUM: 141 mmol/L (ref 135–145)

## 2016-11-29 LAB — PARATHYROID HORMONE, INTACT (NO CA): PTH: 114 pg/mL — ABNORMAL HIGH (ref 15–65)

## 2016-11-29 NOTE — Progress Notes (Signed)
PROGRESS NOTE  Rachael [name]  BSJ:[id number] DOB: August 29, 1956 DOA: 11/26/2016 PCP: Ronita Hipps, MD   Brief Narrative: Rachael [name] is a 61 y.o. female with a history of HTN, GERD, hypothyroidism, depression, CVA, PAF, CKD stage IV and biopsy-proven interstitial nephritis thought to be due to protonix who presented with nausea, vomiting, leg swelling and generalized weakness found to be in acute renal failure. She had been taking prednisone for IN Dx Jan 2018 which initially required HD, but stopped earlier this month due to nausea and vomiting.   In ED, patient was hemodynamically stable. Blood work was notable for hemoglobin of 10.9, potassium 3.3, creatinine 7.74. Renal U/S showed 7.1cm (R) and 8cm (L) with diffusely increased echogenicity without hydronephrosis. Nephrology was consulted and recommended trial of solumedrol, with plan to insert [facility] long term HD if no renal recovery. Otherwise, may discharge on oral hydrocortisone.   Assessment & Plan: Principal Problem:   Acute renal failure superimposed on stage 5 chronic kidney disease, not on chronic dialysis (HCC) Active Problems:   UTI (urinary tract infection)   Nausea and vomiting   HTN (hypertension)   Hypothyroidism   History of CVA (cerebrovascular accident)   Depression   Paroxysmal atrial fibrillation (HCC)  Acute renal failure on CKD stage IV vs. ESRD with interstitial nephritis: Creatinine up 3 > 7.7 on admission with discontinuation of prednisone for AIN (biopsy-proven). No significant change in creatinine 3/24. - Daily RFP, no indications for urgent HD, making urine (poorly charted).  - Continue IV solumedrol to see if AIN can be treated and avoid need for HD.  - Of course, PPI is on allergy list. - If no improvement: [facility] 3/[facility] and HD  - Clip process has been started per nephrology - Iron panel consistent with AOCD, monitor hgb. Nephrology providing ESA, iron.  Recurrent nausea and vomiting: Possibly due to  uremia. GERD/hiatal hernia posibly contributing since off PPI.  - Pepcid - Continue reglan 5 mg TID and phenergan prn (zofran was ineffective) - IVF at modest rate until po intake picks up  Asymptomatic bacteriuria: Has been taking cipro 3/16 - 3/23, will stop this as UCx was nonclonal.  - Monitor for symptoms. - DC'ed cipro  HTN: Chronic, stable. normotensive without medications.  - Monitor  Hypothyroidism: Last TSH was 1.08 on 10/27/16 - Continue home synthroid  History of CVA: Chronic, stable.  -continue plavix and Lipitor  Depression: Chronic, stable. No suicidal or homicidal ideations. - Continue home medications: Citalopram, decrease to 20mg  daily (max dose in this age)  HLD: Chronic, stable.  - Continue lipitor  DVT prophylaxis: Heparin Code Status: Full Family Communication: None at bedside Disposition Plan: Anticipate DC to home early next week.  Consultants:   Nephrology, Dr. Moshe Cipro  Procedures:   None  Antimicrobials:  None   Subjective: Pt still nauseated without emesis, improved with phenergan. Eating ~50%. Denies dyspnea, chest pain.   Objective: Vitals:   11/28/16 1700 11/28/16 2211 11/29/16 0634 11/29/16 1000  BP:  (!) 132/52 (!) 123/59 121/70  Pulse:  80 81 80  Resp:  14 15 18   Temp: 98.1 F (36.7 C) 97.9 F (36.6 C) 98.8 F (37.1 C) 97.7 F (36.5 C)  TempSrc: Oral Oral Oral Oral  SpO2:  95% 95% 96%  Weight:  100.5 kg (221 lb 8 oz)    Height:        Intake/Output Summary (Last 24 hours) at 11/29/16 1531 Last data filed at 11/29/16 1410  Gross per  24 hour  Intake              480 ml  Output              820 ml  Net             -340 ml   Filed Weights   11/27/16 0500 11/27/16 2120 11/28/16 2211  Weight: 96.5 kg (212 lb 11.9 oz) 97.2 kg (214 lb 4.6 oz) 100.5 kg (221 lb 8 oz)    Examination: General exam: 61 y.o. female in no distress Respiratory system: Non-labored breathing room air. Diminished with scant crackles  transiently heard on left base, cleared.  Cardiovascular system: Regular rate and rhythm. No murmur, rub, or gallop. No JVD, and trace pedal edema on obese habitus. Gastrointestinal system: Abdomen soft, non-tender, non-distended, with normoactive bowel sounds. No organomegaly or masses felt. Central nervous system: Alert and oriented. No focal neurological deficits. No asterixis. Extremities: Warm, no deformities Skin: No rashes, lesions no ulcers Psychiatry: Judgement and insight appear normal. Mood & affect appropriate.   Data Reviewed: I have personally reviewed following labs and imaging studies  CBC:  Recent Labs Lab 11/26/16 1951 11/27/16 0536  WBC 9.3 6.9  HGB 10.9* 9.0*  HCT 32.9* 27.8*  MCV 83.1 82.5  PLT 257 716   Basic Metabolic Panel:  Recent Labs Lab 11/26/16 1951 11/27/16 0536 11/28/16 0321 11/29/16 0541  NA 138 137 138 141  K 3.3* 3.2* 4.3 3.9  CL 109 107 111 117*  CO2 17* 17* 14* 14*  GLUCOSE 149* 105* 170* 94  BUN 22* 23* 28* 36*  CREATININE 7.74* 7.61* 7.56* 7.29*  CALCIUM 8.8* 8.3* 8.0* 8.2*  PHOS  --   --  3.6 5.0*   GFR: Estimated Creatinine Clearance: 8.9 mL/min (A) (by C-G formula based on SCr of 7.29 mg/dL (H)). Liver Function Tests:  Recent Labs Lab 11/26/16 1951 11/28/16 0321 11/29/16 0541  AST 16  --   --   ALT 15  --   --   ALKPHOS 63  --   --   BILITOT 0.6  --   --   PROT 6.8  --   --   ALBUMIN 3.3* 2.5* 2.7*    Recent Labs Lab 11/26/16 1951  LIPASE 17   No results for input(s): AMMONIA in the last 168 hours. Coagulation Profile: No results for input(s): INR, PROTIME in the last 168 hours. Cardiac Enzymes: No results for input(s): CKTOTAL, CKMB, CKMBINDEX, TROPONINI in the last 168 hours. BNP (last 3 results) No results for input(s): PROBNP in the last 8760 hours. HbA1C: No results for input(s): HGBA1C in the last 72 hours. CBG: No results for input(s): GLUCAP in the last 168 hours. Lipid Profile: No results for  input(s): CHOL, HDL, LDLCALC, TRIG, CHOLHDL, LDLDIRECT in the last 72 hours. Thyroid Function Tests: No results for input(s): TSH, T4TOTAL, FREET4, T3FREE, THYROIDAB in the last 72 hours. Anemia Panel:  Recent Labs  11/27/16 1516  FERRITIN 72  TIBC 262  IRON 30   Urine analysis:    Component Value Date/Time   COLORURINE YELLOW 11/26/2016 2008   APPEARANCEUR CLOUDY (A) 11/26/2016 2008   LABSPEC 1.011 11/26/2016 2008   PHURINE 6.0 11/26/2016 2008   GLUCOSEU >=500 (A) 11/26/2016 2008   HGBUR SMALL (A) 11/26/2016 2008   BILIRUBINUR NEGATIVE 11/26/2016 2008   KETONESUR 5 (A) 11/26/2016 2008   PROTEINUR 100 (A) 11/26/2016 2008   NITRITE NEGATIVE 11/26/2016 2008   LEUKOCYTESUR MODERATE (A)  11/26/2016 2008   Recent Results (from the past 240 hour(s))  Urine culture     Status: Abnormal   Collection Time: 11/27/16 12:55 AM  Result Value Ref Range Status   Specimen Description URINE, CLEAN CATCH  Final   Special Requests NONE  Final   Culture MULTIPLE SPECIES PRESENT, SUGGEST RECOLLECTION (A)  Final   Report Status 11/28/2016 FINAL  Final  Culture, blood (Routine X 2) w Reflex to ID Panel     Status: None (Preliminary result)   Collection Time: 11/27/16  2:32 AM  Result Value Ref Range Status   Specimen Description BLOOD RIGHT ARM  Final   Special Requests BOTTLES DRAWN AEROBIC AND ANAEROBIC 5ML  Final   Culture NO GROWTH 2 DAYS  Final   Report Status PENDING  Incomplete  Culture, blood (Routine X 2) w Reflex to ID Panel     Status: None (Preliminary result)   Collection Time: 11/27/16  2:35 AM  Result Value Ref Range Status   Specimen Description BLOOD LEFT ARM  Final   Special Requests BOTTLES DRAWN AEROBIC AND ANAEROBIC 5ML  Final   Culture NO GROWTH 2 DAYS  Final   Report Status PENDING  Incomplete      Radiology Studies: No results found.  Scheduled Meds: . atorvastatin  40 mg Oral q1800  . citalopram  20 mg Oral Daily  . clopidogrel  75 mg Oral Daily  .  famotidine  20 mg Oral Daily  . feeding supplement (NEPRO CARB STEADY)  237 mL Oral BID BM  . ferumoxytol  510 mg Intravenous Once every 4 days  . heparin  5,000 Units Subcutaneous Q8H  . levothyroxine  88 mcg Oral QAC breakfast  . methylPREDNISolone (SOLU-MEDROL) injection  60 mg Intravenous Daily  . metoCLOPramide  5 mg Oral [id number]  . sodium chloride flush  3 mL Intravenous Q12H   Continuous Infusions: . sodium chloride 50 mL/hr at 11/28/16 1444     LOS: 2 days   Time spent: 25 minutes.  Vance Gather, MD [facility] Pager [phone]  If 7PM-7AM, please contact night-coverage [email or web address] Password [facility] 11/29/2016, 3:31 PM

## 2016-11-29 NOTE — Progress Notes (Signed)
Subjective:  UOP not recorded- creatinine actually appears to be decreasing ?  no new complaints but still weak and nauseated but when necessary's help Objective Vital signs in last 24 hours: Vitals:   11/28/16 1700 11/28/16 2211 11/29/16 0634 11/29/16 1000  BP:  (!) 132/52 (!) 123/59 121/70  Pulse:  80 81 80  Resp:  14 15 18   Temp: 98.1 F (36.7 C) 97.9 F (36.6 C) 98.8 F (37.1 C) 97.7 F (36.5 C)  TempSrc: Oral Oral Oral Oral  SpO2:  95% 95% 96%  Weight:  100.5 kg (221 lb 8 oz)    Height:       Weight change: 3.272 kg (7 lb 3.4 oz)  Intake/Output Summary (Last 24 hours) at 11/29/16 1123 Last data filed at 11/29/16 0600  Gross per 24 hour  Intake                0 ml  Output              600 ml  Net             -600 ml    Assessment/ Plan: Pt is a 61 y.o. yo female who was admitted on 11/26/2016 with N/V and creatinine above her recent baseline  Assessment/Plan: 1. Renal- has biopsy Proven AIN that initially responded to steroids with a nadir creatinine of 3.2. She now has suffered acute on chronic renal failure but in the setting of abruptly discontinuing her steroids. Plan is for trial of steroids again to see if kidney function will improve but if not then patient may require dialysis.  Technically creatinine trending down but not by much 2. HTN/vol- fluid retention secondary to steroids but blood pressure is excellent on no meds 3. Anemia- iron is being repleted- gave dose of ESA as well 4. Secondary hyperparathyroidism- last PTH was 111- phos is 3.6- no bone meds on board   Shantina Chronister A    Labs: Basic Metabolic Panel:  Recent Labs Lab 11/27/16 0536 11/28/16 0321 11/29/16 0541  NA 137 138 141  K 3.2* 4.3 3.9  CL 107 111 117*  CO2 17* 14* 14*  GLUCOSE 105* 170* 94  BUN 23* 28* 36*  CREATININE 7.61* 7.56* 7.29*  CALCIUM 8.3* 8.0* 8.2*  PHOS  --  3.6 5.0*   Liver Function Tests:  Recent Labs Lab 11/26/16 1951 11/28/16 0321 11/29/16 0541  AST  16  --   --   ALT 15  --   --   ALKPHOS 63  --   --   BILITOT 0.6  --   --   PROT 6.8  --   --   ALBUMIN 3.3* 2.5* 2.7*    Recent Labs Lab 11/26/16 1951  LIPASE 17   No results for input(s): AMMONIA in the last 168 hours. CBC:  Recent Labs Lab 11/26/16 1951 11/27/16 0536  WBC 9.3 6.9  HGB 10.9* 9.0*  HCT 32.9* 27.8*  MCV 83.1 82.5  PLT 257 194   Cardiac Enzymes: No results for input(s): CKTOTAL, CKMB, CKMBINDEX, TROPONINI in the last 168 hours. CBG: No results for input(s): GLUCAP in the last 168 hours.  Iron Studies:   Recent Labs  11/27/16 1516  IRON 30  TIBC 262  FERRITIN 72   Studies/Results: No results found. Medications: Infusions: . sodium chloride 50 mL/hr at 11/28/16 1444    Scheduled Medications: . atorvastatin  40 mg Oral q1800  . citalopram  20 mg Oral Daily  . clopidogrel  75 mg Oral Daily  . famotidine  20 mg Oral Daily  . feeding supplement (NEPRO CARB STEADY)  237 mL Oral BID BM  . ferumoxytol  510 mg Intravenous Once every 4 days  . heparin  5,000 Units Subcutaneous Q8H  . levothyroxine  88 mcg Oral QAC breakfast  . methylPREDNISolone (SOLU-MEDROL) injection  60 mg Intravenous Daily  . metoCLOPramide  5 mg Oral [id number]  . sodium chloride flush  3 mL Intravenous Q12H    have reviewed scheduled and prn medications.  Physical Exam: General: Obese, lying in bed in no acute distress Heart: Regular rate and rhythm  Lungs: Mostly clear  Abdomen: Obese, soft, nontender  Extremities: Minimal edema     11/29/2016,11:23 AM  LOS: 2 days

## 2016-11-30 DIAGNOSIS — F320 Major depressive disorder, single episode, mild: Secondary | ICD-10-CM

## 2016-11-30 LAB — RENAL FUNCTION PANEL
ANION GAP: 12 (ref 5–15)
Albumin: 2.8 g/dL — ABNORMAL LOW (ref 3.5–5.0)
BUN: 46 mg/dL — ABNORMAL HIGH (ref 6–20)
CHLORIDE: 114 mmol/L — AB (ref 101–111)
CO2: 15 mmol/L — ABNORMAL LOW (ref 22–32)
Calcium: 8.2 mg/dL — ABNORMAL LOW (ref 8.9–10.3)
Creatinine, Ser: 6.84 mg/dL — ABNORMAL HIGH (ref 0.44–1.00)
GFR calc non Af Amer: 6 mL/min — ABNORMAL LOW (ref >60)
GFR, EST AFRICAN AMERICAN: 7 mL/min — AB (ref >60)
GLUCOSE: 86 mg/dL (ref 65–99)
POTASSIUM: 3.8 mmol/L (ref 3.5–5.1)
Phosphorus: 4.6 mg/dL (ref 2.5–4.6)
Sodium: 141 mmol/L (ref 135–145)

## 2016-11-30 MED ORDER — FUROSEMIDE 40 MG PO TABS
40.0000 mg | ORAL_TABLET | Freq: Two times a day (BID) | ORAL | Status: DC
  Administered 2016-11-30 – 2016-12-01 (×3): 40 mg via ORAL
  Filled 2016-11-30 (×3): qty 1

## 2016-11-30 MED ORDER — DARBEPOETIN ALFA 60 MCG/0.3ML IJ SOSY
60.0000 ug | PREFILLED_SYRINGE | Freq: Once | INTRAMUSCULAR | Status: AC
  Administered 2016-11-30: 60 ug via SUBCUTANEOUS
  Filled 2016-11-30: qty 0.3

## 2016-11-30 NOTE — Progress Notes (Signed)
Subjective:  UOP at least 520-  creatinine actually appears to be decreasing ?  no new complaints but still weak and nauseated but when necessary's help Objective Vital signs in last 24 hours: Vitals:   11/29/16 1700 11/29/16 2038 11/30/16 0606 11/30/16 1002  BP: 126/74 (!) 153/79 (!) 169/74 (!) 141/73  Pulse: 90 88 (!) 113 95  Resp: 20 18 19 20   Temp: 98.1 F (36.7 C) 97.4 F (36.3 C) 98.2 F (36.8 C) 98.1 F (36.7 C)  TempSrc: Oral Oral Oral Oral  SpO2: 95% 95% 94% 94%  Weight:  100.3 kg (221 lb 1.6 oz)    Height:       Weight change: -0.181 kg (-6.4 oz)  Intake/Output Summary (Last 24 hours) at 11/30/16 1116 Last data filed at 11/30/16 1039  Gross per 24 hour  Intake              240 ml  Output              520 ml  Net             -280 ml    Assessment/ Plan: Pt is a 61 y.o. yo female who was admitted on 11/26/2016 with N/V and creatinine above her recent baseline  Assessment/Plan: 1. Renal- has biopsy Proven AIN that initially responded to steroids with a nadir creatinine of 3.2. She now has suffered acute on chronic renal failure but in the setting of abruptly discontinuing her steroids. Plan is for trial of steroids again to see if kidney function will improve but if not then patient may require dialysis.  Technically creatinine Is improving- down from 7.5 to 6.8 so far- continue current management- no acute needs for HD 2. HTN/vol- fluid retention secondary to steroids  blood pressure has been excellent on no meds- now up some maybe due to fluid and steroids- will add lasix 3. Anemia- iron is being repleted- gave dose of ESA as well 4. Secondary hyperparathyroidism- last PTH was 111- phos is 3.6- no bone meds on board 5. Dispo- endpoint difficult to determine-    Janella Rogala A    Labs: Basic Metabolic Panel:  Recent Labs Lab 11/28/16 0321 11/29/16 0541 11/30/16 0525  NA 138 141 141  K 4.3 3.9 3.8  CL 111 117* 114*  CO2 14* 14* 15*  GLUCOSE 170* 94 86   BUN 28* 36* 46*  CREATININE 7.56* 7.29* 6.84*  CALCIUM 8.0* 8.2* 8.2*  PHOS 3.6 5.0* 4.6   Liver Function Tests:  Recent Labs Lab 11/26/16 1951 11/28/16 0321 11/29/16 0541 11/30/16 0525  AST 16  --   --   --   ALT 15  --   --   --   ALKPHOS 63  --   --   --   BILITOT 0.6  --   --   --   PROT 6.8  --   --   --   ALBUMIN 3.3* 2.5* 2.7* 2.8*    Recent Labs Lab 11/26/16 1951  LIPASE 17   No results for input(s): AMMONIA in the last 168 hours. CBC:  Recent Labs Lab 11/26/16 1951 11/27/16 0536  WBC 9.3 6.9  HGB 10.9* 9.0*  HCT 32.9* 27.8*  MCV 83.1 82.5  PLT 257 194   Cardiac Enzymes: No results for input(s): CKTOTAL, CKMB, CKMBINDEX, TROPONINI in the last 168 hours. CBG: No results for input(s): GLUCAP in the last 168 hours.  Iron Studies:   Recent Labs  11/27/16 1516  IRON  30  TIBC 262  FERRITIN 72   Studies/Results: No results found. Medications: Infusions: . sodium chloride 50 mL/hr (11/30/16 0107)    Scheduled Medications: . atorvastatin  40 mg Oral q1800  . citalopram  20 mg Oral Daily  . clopidogrel  75 mg Oral Daily  . famotidine  20 mg Oral Daily  . feeding supplement (NEPRO CARB STEADY)  237 mL Oral BID BM  . ferumoxytol  510 mg Intravenous Once every 4 days  . heparin  5,000 Units Subcutaneous Q8H  . levothyroxine  88 mcg Oral QAC breakfast  . methylPREDNISolone (SOLU-MEDROL) injection  60 mg Intravenous Daily  . metoCLOPramide  5 mg Oral [id number]  . sodium chloride flush  3 mL Intravenous Q12H    have reviewed scheduled and prn medications.  Physical Exam: General: Obese, lying in bed in no acute distress Heart: Regular rate and rhythm  Lungs: Mostly clear  Abdomen: Obese, soft, nontender  Extremities: Minimal edema     11/30/2016,11:16 AM  LOS: 3 days

## 2016-11-30 NOTE — Progress Notes (Signed)
PROGRESS NOTE  Rachael [name]  XFG:[id number] DOB: May 18, 1956 DOA: 11/26/2016 PCP: Ronita Hipps, MD   Brief Narrative: Rachael [name] is a 61 y.o. female with a history of HTN, GERD, hypothyroidism, depression, CVA, PAF, CKD stage IV and biopsy-proven interstitial nephritis thought to be due to protonix who presented with nausea, vomiting, leg swelling and generalized weakness found to be in acute renal failure. She had been taking prednisone for IN Dx Jan 2018 which initially required HD, but stopped earlier this month due to nausea and vomiting.   In ED, patient was hemodynamically stable. Blood work was notable for hemoglobin of 10.9, potassium 3.3, creatinine 7.74. Renal U/S showed 7.1cm (R) and 8cm (L) with diffusely increased echogenicity without hydronephrosis. Nephrology was consulted and recommended trial of solumedrol, with plan to insert [facility] and institute long term HD if no renal recovery. Otherwise, may discharge on oral hydrocortisone. Creatinine has shown modest improvement, essentially stable.   Assessment & Plan: Principal Problem:   Acute renal failure superimposed on stage 5 chronic kidney disease, not on chronic dialysis (HCC) Active Problems:   UTI (urinary tract infection)   Nausea and vomiting   HTN (hypertension)   Hypothyroidism   History of CVA (cerebrovascular accident)   Depression   Paroxysmal atrial fibrillation (HCC)  Acute renal failure on CKD stage IV vs. ESRD with interstitial nephritis: Creatinine up 3 > 7.7 on admission with discontinuation of prednisone for AIN (biopsy-proven). Equivocal improvement, but encouraging trend. Cr 7.7 > 6.8 - Daily RFP, no indications for urgent HD, making urine (poorly charted).  - Continue IV solumedrol to see if AIN can be treated and avoid need for HD.  - Of course, PPI is on allergy list. - If no improvement: [facility] 3/26 and HD  - Clip process has been started per nephrology - Iron panel consistent with AOCD, monitor hgb.  Nephrology providing ESA, iron.  Recurrent nausea and vomiting: Possibly due to uremia. GERD/hiatal hernia posibly contributing since off PPI.  - Pepcid - Continue reglan 5 mg TID and phenergan prn (zofran was ineffective) - Will DC IVF as her po intake has return to normal.   Asymptomatic bacteriuria: Has been taking cipro 3/16 - 3/23, will stop this as UCx was nonclonal.  - Monitor for symptoms. - DC'ed cipro  HTN: Chronic, stable. normotensive without medications.  - Monitor  Hypothyroidism: Last TSH was 1.08 on 10/27/16 - Continue home synthroid  History of CVA: Chronic, stable.  -continue plavix and Lipitor  Depression: Chronic, stable. No suicidal or homicidal ideations. - Continue home medications: Citalopram, decrease to 20mg  daily (max dose in this age)  HLD: Chronic, stable.  - Continue lipitor  DVT prophylaxis: Heparin Code Status: Full Family Communication: None at bedside Disposition Plan: Anticipate DC to home once AKI resolves or it is determined that she has ESRD.   Consultants:   Nephrology, Dr. Moshe Cipro  Procedures:   None  Antimicrobials:  None   Subjective: Pt without complaints. Upon questioning endorses intermittent nausea occasionally resolved with phenergan. Says she's eating as normal.    Objective: Vitals:   11/29/16 1700 11/29/16 2038 11/30/16 0606 11/30/16 1002  BP: 126/74 (!) 153/79 (!) 169/74 (!) 141/73  Pulse: 90 88 (!) 113 95  Resp: 20 18 19 20   Temp: 98.1 F (36.7 C) 97.4 F (36.3 C) 98.2 F (36.8 C) 98.1 F (36.7 C)  TempSrc: Oral Oral Oral Oral  SpO2: 95% 95% 94% 94%  Weight:  100.3 kg (221 lb 1.6 oz)  Height:        Intake/Output Summary (Last 24 hours) at 11/30/16 1018 Last data filed at 11/30/16 0606  Gross per 24 hour  Intake              240 ml  Output              520 ml  Net             -280 ml   Filed Weights   11/27/16 2120 11/28/16 2211 11/29/16 2038  Weight: 97.2 kg (214 lb 4.6 oz) 100.5  kg (221 lb 8 oz) 100.3 kg (221 lb 1.6 oz)    Examination: General exam: 61 y.o. female in no distress Respiratory system: Non-labored breathing room air. Diminished but clear. Cardiovascular system: Regular rate and rhythm. No murmur, rub, or gallop. No JVD. Trace edema appears resolved.  Gastrointestinal system: Abdomen soft, obese, non-tender, non-distended, with normoactive bowel sounds. No organomegaly or masses felt. Central nervous system: Alert and oriented. No focal neurological deficits. No asterixis. Extremities: Warm, no deformities Skin: No rashes, lesions no ulcers Psychiatry: Judgement and insight appear normal. Mood & affect appropriate.   Data Reviewed: I have personally reviewed following labs and imaging studies  CBC:  Recent Labs Lab 11/26/16 1951 11/27/16 0536  WBC 9.3 6.9  HGB 10.9* 9.0*  HCT 32.9* 27.8*  MCV 83.1 82.5  PLT 257 073   Basic Metabolic Panel:  Recent Labs Lab 11/26/16 1951 11/27/16 0536 11/28/16 0321 11/29/16 0541 11/30/16 0525  NA 138 137 138 141 141  K 3.3* 3.2* 4.3 3.9 3.8  CL 109 107 111 117* 114*  CO2 17* 17* 14* 14* 15*  GLUCOSE 149* 105* 170* 94 86  BUN 22* 23* 28* 36* 46*  CREATININE 7.74* 7.61* 7.56* 7.29* 6.84*  CALCIUM 8.8* 8.3* 8.0* 8.2* 8.2*  PHOS  --   --  3.6 5.0* 4.6   GFR: Estimated Creatinine Clearance: 9.5 mL/min (A) (by C-G formula based on SCr of 6.84 mg/dL (H)). Liver Function Tests:  Recent Labs Lab 11/26/16 1951 11/28/16 0321 11/29/16 0541 11/30/16 0525  AST 16  --   --   --   ALT 15  --   --   --   ALKPHOS 63  --   --   --   BILITOT 0.6  --   --   --   PROT 6.8  --   --   --   ALBUMIN 3.3* 2.5* 2.7* 2.8*    Recent Labs Lab 11/26/16 1951  LIPASE 17   No results for input(s): AMMONIA in the last 168 hours. Coagulation Profile: No results for input(s): INR, PROTIME in the last 168 hours. Cardiac Enzymes: No results for input(s): CKTOTAL, CKMB, CKMBINDEX, TROPONINI in the last 168  hours. BNP (last 3 results) No results for input(s): PROBNP in the last 8760 hours. HbA1C: No results for input(s): HGBA1C in the last 72 hours. CBG: No results for input(s): GLUCAP in the last 168 hours. Lipid Profile: No results for input(s): CHOL, HDL, LDLCALC, TRIG, CHOLHDL, LDLDIRECT in the last 72 hours. Thyroid Function Tests: No results for input(s): TSH, T4TOTAL, FREET4, T3FREE, THYROIDAB in the last 72 hours. Anemia Panel:  Recent Labs  11/27/16 1516  FERRITIN 72  TIBC 262  IRON 30   Urine analysis:    Component Value Date/Time   COLORURINE YELLOW 11/26/2016 2008   APPEARANCEUR CLOUDY (A) 11/26/2016 2008   LABSPEC 1.011 11/26/2016 2008   PHURINE 6.0 11/26/2016 2008  GLUCOSEU >=500 (A) 11/26/2016 2008   HGBUR SMALL (A) 11/26/2016 2008   BILIRUBINUR NEGATIVE 11/26/2016 2008   KETONESUR 5 (A) 11/26/2016 2008   PROTEINUR 100 (A) 11/26/2016 2008   NITRITE NEGATIVE 11/26/2016 2008   LEUKOCYTESUR MODERATE (A) 11/26/2016 2008   Recent Results (from the past 240 hour(s))  Urine culture     Status: Abnormal   Collection Time: 11/27/16 12:55 AM  Result Value Ref Range Status   Specimen Description URINE, CLEAN CATCH  Final   Special Requests NONE  Final   Culture MULTIPLE SPECIES PRESENT, SUGGEST RECOLLECTION (A)  Final   Report Status 11/28/2016 FINAL  Final  Culture, blood (Routine X 2) w Reflex to ID Panel     Status: None (Preliminary result)   Collection Time: 11/27/16  2:32 AM  Result Value Ref Range Status   Specimen Description BLOOD RIGHT ARM  Final   Special Requests BOTTLES DRAWN AEROBIC AND ANAEROBIC 5ML  Final   Culture NO GROWTH 2 DAYS  Final   Report Status PENDING  Incomplete  Culture, blood (Routine X 2) w Reflex to ID Panel     Status: None (Preliminary result)   Collection Time: 11/27/16  2:35 AM  Result Value Ref Range Status   Specimen Description BLOOD LEFT ARM  Final   Special Requests BOTTLES DRAWN AEROBIC AND ANAEROBIC 5ML  Final    Culture NO GROWTH 2 DAYS  Final   Report Status PENDING  Incomplete      Radiology Studies: No results found.  Scheduled Meds: . atorvastatin  40 mg Oral q1800  . citalopram  20 mg Oral Daily  . clopidogrel  75 mg Oral Daily  . famotidine  20 mg Oral Daily  . feeding supplement (NEPRO CARB STEADY)  237 mL Oral BID BM  . ferumoxytol  510 mg Intravenous Once every 4 days  . heparin  5,000 Units Subcutaneous Q8H  . levothyroxine  88 mcg Oral QAC breakfast  . methylPREDNISolone (SOLU-MEDROL) injection  60 mg Intravenous Daily  . metoCLOPramide  5 mg Oral [id number]  . sodium chloride flush  3 mL Intravenous Q12H   Continuous Infusions: . sodium chloride 50 mL/hr (11/30/16 0107)     LOS: 3 days   Time spent: 25 minutes.  Vance Gather, MD [facility] Pager [phone]  If 7PM-7AM, please contact night-coverage [email or web address] Password [facility] 11/30/2016, 10:18 AM

## 2016-12-01 LAB — RENAL FUNCTION PANEL
Albumin: 2.9 g/dL — ABNORMAL LOW (ref 3.5–5.0)
Anion gap: 10 (ref 5–15)
BUN: 45 mg/dL — ABNORMAL HIGH (ref 6–20)
CHLORIDE: 119 mmol/L — AB (ref 101–111)
CO2: 14 mmol/L — ABNORMAL LOW (ref 22–32)
Calcium: 8.3 mg/dL — ABNORMAL LOW (ref 8.9–10.3)
Creatinine, Ser: 6.05 mg/dL — ABNORMAL HIGH (ref 0.44–1.00)
GFR calc non Af Amer: 7 mL/min — ABNORMAL LOW (ref >60)
GFR, EST AFRICAN AMERICAN: 8 mL/min — AB (ref >60)
Glucose, Bld: 76 mg/dL (ref 65–99)
POTASSIUM: 3.9 mmol/L (ref 3.5–5.1)
Phosphorus: 5.2 mg/dL — ABNORMAL HIGH (ref 2.5–4.6)
Sodium: 143 mmol/L (ref 135–145)

## 2016-12-01 MED ORDER — SODIUM BICARBONATE 650 MG PO TABS
650.0000 mg | ORAL_TABLET | Freq: Three times a day (TID) | ORAL | Status: DC
  Administered 2016-12-01: 650 mg via ORAL
  Filled 2016-12-01: qty 1

## 2016-12-01 NOTE — Progress Notes (Signed)
PROGRESS NOTE  Rachael [name]  CLE:[id number] DOB: 1955-11-24 DOA: 11/26/2016 PCP: Ronita Hipps, MD   Brief Narrative: Rachael [name] is a 61 y.o. female with a history of HTN, GERD, hypothyroidism, depression, CVA, PAF, CKD stage IV and biopsy-proven interstitial nephritis thought to be due to protonix who presented with nausea, vomiting, leg swelling and generalized weakness found to be in acute renal failure. She had been taking prednisone for IN Dx Jan 2018 which initially required HD, but stopped earlier this month due to nausea and vomiting.   In ED, patient was hemodynamically stable. Blood work was notable for hemoglobin of 10.9, potassium 3.3, creatinine 7.74. Renal U/S showed 7.1cm (R) and 8cm (L) with diffusely increased echogenicity without hydronephrosis. Nephrology was consulted and recommended trial of solumedrol, with plan to insert [facility] and institute long term HD if no renal recovery. Otherwise, may discharge on oral hydrocortisone. Creatinine has shown modest improvement.  Assessment & Plan: Principal Problem:   Acute renal failure superimposed on stage 5 chronic kidney disease, not on chronic dialysis (HCC) Active Problems:   UTI (urinary tract infection)   Nausea and vomiting   HTN (hypertension)   Hypothyroidism   History of CVA (cerebrovascular accident)   Depression   Paroxysmal atrial fibrillation (HCC)  Aute renal failure on CKD stage IV vs. ESRD with interstitial nephritis: Creatinine up 3 > 7.7 on admission with discontinuation of prednisone for AIN (biopsy-proven). Modest improvement continues, encouraging trend. Cr 7.7 > 6.0 - Daily RFP, no indications for urgent HD, making urine.  - Large diuresis 3/25 with lasix.  - Continue IV solumedrol to see if AIN can be treated and avoid need for HD. No hyperglycemia noted.  - Of course, PPI is on allergy list.  - Clip process has been started per nephrology - Iron panel consistent with AOCD, monitor hgb. Nephrology  providing ESA, iron.c  Recurrent nausea and vomiting: Possibly due to uremia. GERD/hiatal hernia posibly contributing since off PPI.  - Pepcid - Continue reglan 5 mg TID and phenergan prn (zofran was ineffective) - Will DC IVF as her po intake has return to normal.   Asymptomatic bacteriuria: Has been taking cipro 3/16 - 3/23, will stop this as UCx was nonclonal.  - Monitor for symptoms. - DC'ed cipro  HTN: Chronic, stable. normotensive without medications.  - Monitor  Hypothyroidism: Last TSH was 1.08 on 10/27/16 - Continue home synthroid  History of CVA: Chronic, stable.  -continue plavix and Lipitor  Depression: Chronic, stable. No suicidal or homicidal ideations. - Continue home medications: Citalopram, decrease to 20mg  daily (max dose in this age)  HLD: Chronic, stable.  - Continue lipitor  DVT prophylaxis: Heparin Code Status: Full Family Communication: None at bedside Disposition Plan: Anticipate DC to home once AKI resolving or after clip process completed for ESRD.  Consultants:   Nephrology, Dr. Moshe Cipro  Procedures:   None  Antimicrobials:  None   Subjective: Pt eating normally, nausea without emesis is stable and improved with prn phenergan. Urinating much more. No dyspnea, chest pain, itching, confusion.   Objective: Vitals:   11/30/16 1002 11/30/16 2002 12/01/16 0543 12/01/16 0915  BP: (!) 141/73 (!) 157/80 140/76 132/80  Pulse: 95 87 90 88  Resp: 20 19 19 20   Temp: 98.1 F (36.7 C) 97.9 F (36.6 C) 98.7 F (37.1 C) 98.5 F (36.9 C)  TempSrc: Oral Oral Oral Oral  SpO2: 94% 94% 94% 95%  Weight:  100.2 kg (220 lb 14.4 oz)  Height:        Intake/Output Summary (Last 24 hours) at 12/01/16 1006 Last data filed at 12/01/16 0545  Gross per 24 hour  Intake              480 ml  Output             2250 ml  Net            -1770 ml   Filed Weights   11/28/16 2211 11/29/16 2038 11/30/16 2002  Weight: 100.5 kg (221 lb 8 oz) 100.3 kg  (221 lb 1.6 oz) 100.2 kg (220 lb 14.4 oz)    Examination: General exam: 61 y.o. female in no distress Respiratory system: Non-labored breathing room air. Diminished but clear. Cardiovascular system: Regular rate and rhythm. No murmur, rub, or gallop. No JVD. Trace edema appears resolved.  Gastrointestinal system: Abdomen soft, obese, non-tender, non-distended, with normoactive bowel sounds. No organomegaly or masses felt. Central nervous system: Alert and oriented. No focal neurological deficits. No asterixis. Extremities: Warm, no deformities Skin: No rashes, lesions no ulcers Psychiatry: Judgement and insight appear normal. Mood & affect appropriate.   Data Reviewed: I have personally reviewed following labs and imaging studies  CBC:  Recent Labs Lab 11/26/16 1951 11/27/16 0536  WBC 9.3 6.9  HGB 10.9* 9.0*  HCT 32.9* 27.8*  MCV 83.1 82.5  PLT 257 009   Basic Metabolic Panel:  Recent Labs Lab 11/27/16 0536 11/28/16 0321 11/29/16 0541 11/30/16 0525 12/01/16 0648  NA 137 138 141 141 143  K 3.2* 4.3 3.9 3.8 3.9  CL 107 111 117* 114* 119*  CO2 17* 14* 14* 15* 14*  GLUCOSE 105* 170* 94 86 76  BUN 23* 28* 36* 46* 45*  CREATININE 7.61* 7.56* 7.29* 6.84* 6.05*  CALCIUM 8.3* 8.0* 8.2* 8.2* 8.3*  PHOS  --  3.6 5.0* 4.6 5.2*   GFR: Estimated Creatinine Clearance: 10.7 mL/min (A) (by C-G formula based on SCr of 6.05 mg/dL (H)). Liver Function Tests:  Recent Labs Lab 11/26/16 1951 11/28/16 0321 11/29/16 0541 11/30/16 0525 12/01/16 0648  AST 16  --   --   --   --   ALT 15  --   --   --   --   ALKPHOS 63  --   --   --   --   BILITOT 0.6  --   --   --   --   PROT 6.8  --   --   --   --   ALBUMIN 3.3* 2.5* 2.7* 2.8* 2.9*    Recent Labs Lab 11/26/16 1951  LIPASE 17   No results for input(s): AMMONIA in the last 168 hours. Coagulation Profile: No results for input(s): INR, PROTIME in the last 168 hours. Cardiac Enzymes: No results for input(s): CKTOTAL, CKMB,  CKMBINDEX, TROPONINI in the last 168 hours. BNP (last 3 results) No results for input(s): PROBNP in the last 8760 hours. HbA1C: No results for input(s): HGBA1C in the last 72 hours. CBG: No results for input(s): GLUCAP in the last 168 hours. Lipid Profile: No results for input(s): CHOL, HDL, LDLCALC, TRIG, CHOLHDL, LDLDIRECT in the last 72 hours. Thyroid Function Tests: No results for input(s): TSH, T4TOTAL, FREET4, T3FREE, THYROIDAB in the last 72 hours. Anemia Panel: No results for input(s): VITAMINB12, FOLATE, FERRITIN, TIBC, IRON, RETICCTPCT in the last 72 hours. Urine analysis:    Component Value Date/Time   COLORURINE YELLOW 11/26/2016 2008   APPEARANCEUR CLOUDY (A) 11/26/2016 2008  LABSPEC 1.011 11/26/2016 2008   PHURINE 6.0 11/26/2016 2008   GLUCOSEU >=500 (A) 11/26/2016 2008   HGBUR SMALL (A) 11/26/2016 2008   BILIRUBINUR NEGATIVE 11/26/2016 2008   KETONESUR 5 (A) 11/26/2016 2008   PROTEINUR 100 (A) 11/26/2016 2008   NITRITE NEGATIVE 11/26/2016 2008   LEUKOCYTESUR MODERATE (A) 11/26/2016 2008   Recent Results (from the past 240 hour(s))  Urine culture     Status: Abnormal   Collection Time: 11/27/16 12:55 AM  Result Value Ref Range Status   Specimen Description URINE, CLEAN CATCH  Final   Special Requests NONE  Final   Culture MULTIPLE SPECIES PRESENT, SUGGEST RECOLLECTION (A)  Final   Report Status 11/28/2016 FINAL  Final  Culture, blood (Routine X 2) w Reflex to ID Panel     Status: None (Preliminary result)   Collection Time: 11/27/16  2:32 AM  Result Value Ref Range Status   Specimen Description BLOOD RIGHT ARM  Final   Special Requests BOTTLES DRAWN AEROBIC AND ANAEROBIC 5ML  Final   Culture NO GROWTH 3 DAYS  Final   Report Status PENDING  Incomplete  Culture, blood (Routine X 2) w Reflex to ID Panel     Status: None (Preliminary result)   Collection Time: 11/27/16  2:35 AM  Result Value Ref Range Status   Specimen Description BLOOD LEFT ARM  Final    Special Requests BOTTLES DRAWN AEROBIC AND ANAEROBIC 5ML  Final   Culture NO GROWTH 3 DAYS  Final   Report Status PENDING  Incomplete      Radiology Studies: No results found.  Scheduled Meds: . atorvastatin  40 mg Oral q1800  . citalopram  20 mg Oral Daily  . clopidogrel  75 mg Oral Daily  . famotidine  20 mg Oral Daily  . feeding supplement (NEPRO CARB STEADY)  237 mL Oral BID BM  . ferumoxytol  510 mg Intravenous Once every 4 days  . furosemide  40 mg Oral BID  . heparin  5,000 Units Subcutaneous Q8H  . levothyroxine  88 mcg Oral QAC breakfast  . methylPREDNISolone (SOLU-MEDROL) injection  60 mg Intravenous Daily  . metoCLOPramide  5 mg Oral [id number]  . sodium chloride flush  3 mL Intravenous Q12H   Continuous Infusions: . sodium chloride 50 mL/hr at 12/01/16 1001     LOS: 4 days   Time spent: 25 minutes.  Vance Gather, MD [facility] Pager [phone]  If 7PM-7AM, please contact night-coverage [email or web address] Password [email or web address] 12/01/2016, 10:06 AM

## 2016-12-01 NOTE — Progress Notes (Signed)
[facility] Rounding Note  Subjective:   Good UOP! No nausea or other c/o at this time Says she stopped taking her prednisone (40 mg/day) because it was making her nauseous No issues so far with the IV solumedrol  Objective Vital signs in last 24 hours: Vitals:   11/30/16 2002 12/01/16 0543 12/01/16 0915 12/01/16 1806  BP: (!) 157/80 140/76 132/80 (!) 145/78  Pulse: 87 90 88 88  Resp: 19 19 20 20   Temp: 97.9 F (36.6 C) 98.7 F (37.1 C) 98.5 F (36.9 C) 98.7 F (37.1 C)  TempSrc: Oral Oral Oral Oral  SpO2: 94% 94% 95% 96%  Weight: 100.2 kg (220 lb 14.4 oz)     Height:       Weight change: -0.091 kg (-3.2 oz)  Intake/Output Summary (Last 24 hours) at 12/01/16 1833 Last data filed at 12/01/16 1651  Gross per 24 hour  Intake                0 ml  Output             2750 ml  Net            -2750 ml   Physical Exam: Obese, lying in bed in no acute distress Soft spoken Regular rate and rhythm  Lungs clear  Abdomen: Obese, soft, nontender  Extremities: Minimal if any LE edema    Recent Labs Lab 11/29/16 0541 11/30/16 0525 12/01/16 0648  NA 141 141 143  K 3.9 3.8 3.9  CL 117* 114* 119*  CO2 14* 15* 14*  GLUCOSE 94 86 76  BUN 36* 46* 45*  CREATININE 7.29* 6.84* 6.05*  CALCIUM 8.2* 8.2* 8.3*  PHOS 5.0* 4.6 5.2*    Recent Labs Lab 11/26/16 1951  11/29/16 0541 11/30/16 0525 12/01/16 0648  AST 16  --   --   --   --   ALT 15  --   --   --   --   ALKPHOS 63  --   --   --   --   BILITOT 0.6  --   --   --   --   PROT 6.8  --   --   --   --   ALBUMIN 3.3*  < > 2.7* 2.8* 2.9*  < > = values in this interval not displayed.  Recent Labs Lab 11/26/16 1951  LIPASE 17    Recent Labs Lab 11/26/16 1951 11/27/16 0536  WBC 9.3 6.9  HGB 10.9* 9.0*  HCT 32.9* 27.8*  MCV 83.1 82.5  PLT 257 194   Infusions . sodium chloride 50 mL/hr at 12/01/16 1001    Scheduled Medications: . atorvastatin  40 mg Oral q1800  . citalopram  20 mg Oral Daily  . clopidogrel  75 mg  Oral Daily  . famotidine  20 mg Oral Daily  . feeding supplement (NEPRO CARB STEADY)  237 mL Oral BID BM  . furosemide  40 mg Oral BID  . heparin  5,000 Units Subcutaneous Q8H  . levothyroxine  88 mcg Oral QAC breakfast  . methylPREDNISolone (SOLU-MEDROL) injection  60 mg Intravenous Daily  . metoCLOPramide  5 mg Oral [id number]  . sodium chloride flush  3 mL Intravenous Q12H    Assessment/Plan:  1. Renal- has biopsy Proven AIN (felt 2/2 protonix) that initially responded to steroids with a nadir creatinine of 3.2 (not too bad for her bilaterally small 7.1, 8 cm echodense kidneys). She now has suffered acute on chronic renal  failure but in the setting of abruptly discontinuing her steroids. Plan is to continue with trial of steroids again to see if kidney function will improve but if not then patient may require dialysis. Creatinine has actually improved some from 7.5 down to 6.05 so far - so looks like real improvement. On Solumedrol 60/day since 3/22 and if continued drop in creatinine may be able to transition to po prednisone in the next couple of days 2. Metabolic acidosis - CO2 14 not on any replacement. Start po bicarb 650 TID.  3. HTN/vol- fluid retention secondary to steroids. Blood pressure has been excellent on no meds- now up some maybe due to fluid and steroids - on po lasix BID 4. Anemia-s/p Feraheme X 2  And had 60 mcg dose of Aranesp on 3/25 5. Secondary hyperparathyroidism- last PTH was 111- phos is 3.6- no bone meds on board 6. Dispo- endpoint difficult to determine - but if creatinine continues to trend down will be able to get back on po steroids.  Jamal Maes, MD [facility] [phone] Pager 12/01/2016, 6:34 PM

## 2016-12-02 LAB — RENAL FUNCTION PANEL
ALBUMIN: 2.9 g/dL — AB (ref 3.5–5.0)
Anion gap: 14 (ref 5–15)
BUN: 46 mg/dL — AB (ref 6–20)
CO2: 13 mmol/L — ABNORMAL LOW (ref 22–32)
CREATININE: 5.66 mg/dL — AB (ref 0.44–1.00)
Calcium: 8.3 mg/dL — ABNORMAL LOW (ref 8.9–10.3)
Chloride: 115 mmol/L — ABNORMAL HIGH (ref 101–111)
GFR calc Af Amer: 8 mL/min — ABNORMAL LOW (ref >60)
GFR, EST NON AFRICAN AMERICAN: 7 mL/min — AB (ref >60)
GLUCOSE: 76 mg/dL (ref 65–99)
PHOSPHORUS: 4.9 mg/dL — AB (ref 2.5–4.6)
POTASSIUM: 3.6 mmol/L (ref 3.5–5.1)
Sodium: 142 mmol/L (ref 135–145)

## 2016-12-02 LAB — CULTURE, BLOOD (ROUTINE X 2)
CULTURE: NO GROWTH
Culture: NO GROWTH

## 2016-12-02 MED ORDER — SODIUM BICARBONATE 650 MG PO TABS
1300.0000 mg | ORAL_TABLET | Freq: Three times a day (TID) | ORAL | Status: DC
  Administered 2016-12-02 – 2016-12-04 (×7): 1300 mg via ORAL
  Filled 2016-12-02 (×7): qty 2

## 2016-12-02 MED ORDER — PREDNISONE 50 MG PO TABS
60.0000 mg | ORAL_TABLET | Freq: Every day | ORAL | Status: DC
  Administered 2016-12-02 – 2016-12-04 (×3): 60 mg via ORAL
  Filled 2016-12-02 (×3): qty 1

## 2016-12-02 MED ORDER — FUROSEMIDE 40 MG PO TABS
40.0000 mg | ORAL_TABLET | Freq: Every day | ORAL | Status: DC
  Administered 2016-12-02 – 2016-12-04 (×3): 40 mg via ORAL
  Filled 2016-12-02 (×3): qty 1

## 2016-12-02 NOTE — Care Management Important Message (Signed)
Important Message  Patient Details  Name: Rachael [name] MRN: [id number] Date of Birth: 05-16-1956   Medicare Important Message Given:       Orbie Pyo 12/02/2016, 12:03 PM

## 2016-12-02 NOTE — Progress Notes (Signed)
PROGRESS NOTE  Rachael [name]  GDJ:[id number] DOB: [birth year] DOA: 11/26/2016 PCP: Ronita Hipps, MD   Brief Narrative: Rachael [name] is a 61 y.o. female with a history of HTN, GERD, hypothyroidism, depression, CVA, PAF, CKD stage IV and biopsy-proven interstitial nephritis thought to be due to protonix who presented with nausea, vomiting, leg swelling and generalized weakness found to be in acute renal failure. She had been taking prednisone for IN Dx Jan 2018 which initially required HD, but stopped earlier this month due to nausea and vomiting.   In ED, patient was hemodynamically stable. Blood work was notable for hemoglobin of 10.9, potassium 3.3, creatinine 7.74. Renal U/S showed 7.1cm (R) and 8cm (L) with diffusely increased echogenicity without hydronephrosis. Nephrology was consulted and recommended trial of solumedrol, with plan to insert [facility] and institute long term HD if no renal recovery. Otherwise, may discharge on oral hydrocortisone. Creatinine has shown sustained improvement on IV steroids, so will likely discharge on oral steroids if continues to improve. Nephrology following.   Assessment & Plan: Principal Problem:   Acute renal failure superimposed on stage 5 chronic kidney disease, not on chronic dialysis (HCC) Active Problems:   UTI (urinary tract infection)   Nausea and vomiting   HTN (hypertension)   Hypothyroidism   History of CVA (cerebrovascular accident)   Depression   Paroxysmal atrial fibrillation (HCC)  Aute renal failure on CKD stage IV due to interstitial nephritis: Creatinine up 3 > 7.7 on admission with discontinuation of prednisone for AIN (biopsy-proven, due to PPI).  - Improvement continues, encouraging trend: Cr 7.7 > 5.6 - Daily RFP, no indications for urgent HD, making urine.  - Large diuresis with lasix.  - Transitioning IV solumedrol > prednisone 60mg  po on 3/28.  - Of course, PPI is on allergy list.  - Clip process has been started per nephrology -  Iron panel consistent with AOCD, monitor hgb. Nephrology providing ESA, iron.c  Metabolic acidosis: due to renal failure, tubulointerstitial dz.  - Started bicarb 3/26 for CO2 of 14, titrating up today.   Recurrent nausea and vomiting: Possibly due to uremia. GERD/hiatal hernia posibly contributing since off PPI.  - Pepcid - Continue reglan 5 mg TID and phenergan prn (zofran was ineffective), has not been significantly nauseated recently. - Will DC IVF as her po intake has return to normal.   Asymptomatic bacteriuria: Has been taking cipro 3/16 - 3/23, will stop this as UCx was nonclonal.  - Monitor for symptoms. - DC'ed cipro  HTN: Chronic, stable. Normotensive without medications.  - Slightly elevated suspect due to steroids.  - Reducing lasix - Continue to monitor and consider low dose norvasc if remains elevated.  Hypothyroidism: Last TSH was 1.08 on 10/27/16 - Continue home synthroid  History of CVA: Chronic, stable.  -continue plavix and Lipitor  Depression: Chronic, stable. No suicidal or homicidal ideations. - Continue home medications: Citalopram, decrease to 20mg  daily (max dose in this age)  HLD: Chronic, stable.  - Continue lipitor  DVT prophylaxis: Heparin Code Status: Full Family Communication: None at bedside Disposition Plan: Anticipate DC to home once AKI resolving and tolerating oral steroids.   Consultants:   Nephrology, Dr. Moshe Cipro, Dr. Lorrene Reid  Procedures:   None  Antimicrobials:  None   Subjective: Pt eating better, no antiemetics for several days. Significant urine output with no new complaints. No dyspnea, chest pain, itching.  Objective: Vitals:   12/01/16 1806 12/01/16 2117 12/02/16 0528 12/02/16 0849  BP: (!) 145/78 (!) 152/85 [name])  153/72 (!) 142/64  Pulse: 88 84 84 79  Resp: 20 20 19 20   Temp: 98.7 F (37.1 C) 98.3 F (36.8 C) 98.3 F (36.8 C) 98 F (36.7 C)  TempSrc: Oral Oral Oral Oral  SpO2: 96% 97% 98% 98%    Weight:      Height:        Intake/Output Summary (Last 24 hours) at 12/02/16 1113 Last data filed at 12/02/16 9242  Gross per 24 hour  Intake              800 ml  Output             3001 ml  Net            -2201 ml   Filed Weights   11/28/16 2211 11/29/16 2038 11/30/16 2002  Weight: 100.5 kg (221 lb 8 oz) 100.3 kg (221 lb 1.6 oz) 100.2 kg (220 lb 14.4 oz)    Examination: General exam: 61 y.o. female in no distress Respiratory system: Non-labored breathing room air. Diminished but clear. Cardiovascular system: Regular rate and rhythm. No murmur, rub, or gallop. No JVD. Trace edema appears resolved.  Gastrointestinal system: Abdomen soft, obese, non-tender, non-distended, with normoactive bowel sounds. No organomegaly or masses felt. Central nervous system: Alert and oriented. No focal neurological deficits. No asterixis. Extremities: Warm, no deformities Skin: No rashes, lesions no ulcers Psychiatry: Judgement and insight appear normal. Mood & affect appropriate.   Data Reviewed: I have personally reviewed following labs and imaging studies  CBC:  Recent Labs Lab 11/26/16 1951 11/27/16 0536  WBC 9.3 6.9  HGB 10.9* 9.0*  HCT 32.9* 27.8*  MCV 83.1 82.5  PLT 257 683   Basic Metabolic Panel:  Recent Labs Lab 11/28/16 0321 11/29/16 0541 11/30/16 0525 12/01/16 0648 12/02/16 0546  NA 138 141 141 143 142  K 4.3 3.9 3.8 3.9 3.6  CL 111 117* 114* 119* 115*  CO2 14* 14* 15* 14* 13*  GLUCOSE 170* 94 86 76 76  BUN 28* 36* 46* 45* 46*  CREATININE 7.56* 7.29* 6.84* 6.05* 5.66*  CALCIUM 8.0* 8.2* 8.2* 8.3* 8.3*  PHOS 3.6 5.0* 4.6 5.2* 4.9*   GFR: Estimated Creatinine Clearance: 11.4 mL/min (A) (by C-G formula based on SCr of 5.66 mg/dL (H)). Liver Function Tests:  Recent Labs Lab 11/26/16 1951 11/28/16 0321 11/29/16 0541 11/30/16 0525 12/01/16 0648 12/02/16 0546  AST 16  --   --   --   --   --   ALT 15  --   --   --   --   --   ALKPHOS 63  --   --   --   --    --   BILITOT 0.6  --   --   --   --   --   PROT 6.8  --   --   --   --   --   ALBUMIN 3.3* 2.5* 2.7* 2.8* 2.9* 2.9*    Recent Labs Lab 11/26/16 1951  LIPASE 17   No results for input(s): AMMONIA in the last 168 hours. Coagulation Profile: No results for input(s): INR, PROTIME in the last 168 hours. Cardiac Enzymes: No results for input(s): CKTOTAL, CKMB, CKMBINDEX, TROPONINI in the last 168 hours. BNP (last 3 results) No results for input(s): PROBNP in the last 8760 hours. HbA1C: No results for input(s): HGBA1C in the last 72 hours. CBG: No results for input(s): GLUCAP in the last 168 hours. Lipid Profile: No  results for input(s): CHOL, HDL, LDLCALC, TRIG, CHOLHDL, LDLDIRECT in the last 72 hours. Thyroid Function Tests: No results for input(s): TSH, T4TOTAL, FREET4, T3FREE, THYROIDAB in the last 72 hours. Anemia Panel: No results for input(s): VITAMINB12, FOLATE, FERRITIN, TIBC, IRON, RETICCTPCT in the last 72 hours. Urine analysis:    Component Value Date/Time   COLORURINE YELLOW 11/26/2016 2008   APPEARANCEUR CLOUDY (A) 11/26/2016 2008   LABSPEC 1.011 11/26/2016 2008   PHURINE 6.0 11/26/2016 2008   GLUCOSEU >=500 (A) 11/26/2016 2008   HGBUR SMALL (A) 11/26/2016 2008   BILIRUBINUR NEGATIVE 11/26/2016 2008   KETONESUR 5 (A) 11/26/2016 2008   PROTEINUR 100 (A) 11/26/2016 2008   NITRITE NEGATIVE 11/26/2016 2008   LEUKOCYTESUR MODERATE (A) 11/26/2016 2008   Recent Results (from the past 240 hour(s))  Urine culture     Status: Abnormal   Collection Time: 11/27/16 12:55 AM  Result Value Ref Range Status   Specimen Description URINE, CLEAN CATCH  Final   Special Requests NONE  Final   Culture MULTIPLE SPECIES PRESENT, SUGGEST RECOLLECTION (A)  Final   Report Status 11/28/2016 FINAL  Final  Culture, blood (Routine X 2) w Reflex to ID Panel     Status: None (Preliminary result)   Collection Time: 11/27/16  2:32 AM  Result Value Ref Range Status   Specimen Description  BLOOD RIGHT ARM  Final   Special Requests BOTTLES DRAWN AEROBIC AND ANAEROBIC 5ML  Final   Culture NO GROWTH 4 DAYS  Final   Report Status PENDING  Incomplete  Culture, blood (Routine X 2) w Reflex to ID Panel     Status: None (Preliminary result)   Collection Time: 11/27/16  2:35 AM  Result Value Ref Range Status   Specimen Description BLOOD LEFT ARM  Final   Special Requests BOTTLES DRAWN AEROBIC AND ANAEROBIC 5ML  Final   Culture NO GROWTH 4 DAYS  Final   Report Status PENDING  Incomplete      Radiology Studies: No results found.  Scheduled Meds: . atorvastatin  40 mg Oral q1800  . citalopram  20 mg Oral Daily  . clopidogrel  75 mg Oral Daily  . famotidine  20 mg Oral Daily  . feeding supplement (NEPRO CARB STEADY)  237 mL Oral BID BM  . furosemide  40 mg Oral Daily  . heparin  5,000 Units Subcutaneous Q8H  . levothyroxine  88 mcg Oral QAC breakfast  . metoCLOPramide  5 mg Oral [id number]  . predniSONE  60 mg Oral Q breakfast  . sodium bicarbonate  1,300 mg Oral TID  . sodium chloride flush  3 mL Intravenous Q12H   Continuous Infusions:    LOS: 5 days   Time spent: 15 minutes.  Vance Gather, MD [facility] Pager [phone]  If 7PM-7AM, please contact night-coverage [email or web address] Password [email or web address] 12/02/2016, 11:13 AM

## 2016-12-02 NOTE — Progress Notes (Signed)
[facility] Rounding Note  Subjective:   Still with good UOP! (lasix 40 BID) No nausea or other c/o at this time except "just tired" (Says she stopped taking her prednisone as outpt (40 mg/day) because it was making her nauseous but certainly willing to try again) No issues so far with the IV solumedrol  Objective Vital signs in last 24 hours: Vitals:   12/01/16 0915 12/01/16 1806 12/01/16 2117 12/02/16 0528  BP: 132/80 (!) 145/78 (!) 152/85 (!) 153/72  Pulse: 88 88 84 84  Resp: 20 20 20 19   Temp: 98.5 F (36.9 C) 98.7 F (37.1 C) 98.3 F (36.8 C) 98.3 F (36.8 C)  TempSrc: Oral Oral Oral Oral  SpO2: 95% 96% 97% 98%  Weight:      Height:       Weight change:   Intake/Output Summary (Last 24 hours) at 12/02/16 0753 Last data filed at 12/02/16 0500  Gross per 24 hour  Intake              320 ml  Output             2601 ml  Net            -2281 ml   Physical Exam: Obese, lying in bed in no acute distress Cushingoid Soft spoken Regular rate and rhythm  Lungs clear  Abdomen: Obese, soft, nontender  Extremities: Minimal if any LE edema (has very "chubby" ankles but not much in the way of pitting edema)   Recent Labs Lab 11/30/16 0525 12/01/16 0648 12/02/16 0546  NA 141 143 142  K 3.8 3.9 3.6  CL 114* 119* 115*  CO2 15* 14* 13*  GLUCOSE 86 76 76  BUN 46* 45* 46*  CREATININE 6.84* 6.05* 5.66*  CALCIUM 8.2* 8.3* 8.3*  PHOS 4.6 5.2* 4.9*    Recent Labs Lab 11/26/16 1951  11/30/16 0525 12/01/16 0648 12/02/16 0546  AST 16  --   --   --   --   ALT 15  --   --   --   --   ALKPHOS 63  --   --   --   --   BILITOT 0.6  --   --   --   --   PROT 6.8  --   --   --   --   ALBUMIN 3.3*  < > 2.8* 2.9* 2.9*  < > = values in this interval not displayed.  Recent Labs Lab 11/26/16 1951  LIPASE 17    Recent Labs Lab 11/26/16 1951 11/27/16 0536  WBC 9.3 6.9  HGB 10.9* 9.0*  HCT 32.9* 27.8*  MCV 83.1 82.5  PLT 257 194   Infusions . sodium chloride 50 mL/hr at  12/02/16 0232    Scheduled Medications: . atorvastatin  40 mg Oral q1800  . citalopram  20 mg Oral Daily  . clopidogrel  75 mg Oral Daily  . famotidine  20 mg Oral Daily  . feeding supplement (NEPRO CARB STEADY)  237 mL Oral BID BM  . furosemide  40 mg Oral BID  . heparin  5,000 Units Subcutaneous Q8H  . levothyroxine  88 mcg Oral QAC breakfast  . methylPREDNISolone (SOLU-MEDROL) injection  60 mg Intravenous Daily  . metoCLOPramide  5 mg Oral [id number]  . sodium bicarbonate  650 mg Oral TID  . sodium chloride flush  3 mL Intravenous Q12H    Assessment/Plan:  1. AKI on CKD - biopsy proven AIN by 09/2016 renal  bx  (felt 2/2 protonix) 1. Initially responded to steroids with a nadir creatinine of 3.2 (not too bad for her bilaterally small 7.1, 8 cm echodense kidneys).  2. She now has suffered AKI on CKD  in the setting of abruptly discontinuing her steroids (was on 40/day and it caused nausea so she stopped) 3. Plan is to continue with trial of steroids again to see if kidney function will improve but if not then patient may require dialysis.  4. Creatinine has actually improved some from 7.5 down to 5.6  so far - so looks like real improvement.  5. On Solumedrol 60/day since 3/22 - d/c after today's dose and transition to prednisone 60 mg/day po tomorrow 6. If tolerates can follow up with Dr. Hollie Salk in the office  2. Metabolic acidosis - CO2 14 (renal failure + tubulointerstitial dx)  1. Started po bicarb 650 TID 3/26. 2. Increase dose to 2 TID  3. HTN/vol- fluid retention secondary to steroids.  1. Blood pressure had been excellent on no meds- now up some maybe due to fluid and steroids 2.  On po lasix BID - reduce to once a day 3. Stop IVFs (still going at 89) 4. May need to add something like low dose amlodipine (have not done so far) 4. Anemia 1. s/p Feraheme X 2   2. 60 mcg dose of Aranesp on 3/25 5. Secondary hyperparathyroidism- last PTH was 111- phos is 3.6- no bone meds on  board 6. Dispo- transition to po steroids tomorrow, stop IVF, increase oral Na bicarb dose  Jamal Maes, MD [facility] Pager 12/02/2016, 7:53 AM

## 2016-12-02 NOTE — Care Management Important Message (Signed)
Important Message  Patient Details  Name: Rachael [name] MRN: [id number] Date of Birth: July 09, 1956   Medicare Important Message Given:  Yes    Orbie Pyo 12/02/2016, 12:03 PM

## 2016-12-03 LAB — RENAL FUNCTION PANEL
ANION GAP: 11 (ref 5–15)
Albumin: 3 g/dL — ABNORMAL LOW (ref 3.5–5.0)
BUN: 45 mg/dL — ABNORMAL HIGH (ref 6–20)
CHLORIDE: 115 mmol/L — AB (ref 101–111)
CO2: 15 mmol/L — AB (ref 22–32)
Calcium: 8.1 mg/dL — ABNORMAL LOW (ref 8.9–10.3)
Creatinine, Ser: 4.98 mg/dL — ABNORMAL HIGH (ref 0.44–1.00)
GFR calc non Af Amer: 8 mL/min — ABNORMAL LOW (ref >60)
GFR, EST AFRICAN AMERICAN: 10 mL/min — AB (ref >60)
Glucose, Bld: 92 mg/dL (ref 65–99)
POTASSIUM: 3.5 mmol/L (ref 3.5–5.1)
Phosphorus: 4.5 mg/dL (ref 2.5–4.6)
Sodium: 141 mmol/L (ref 135–145)

## 2016-12-03 LAB — CBC
HEMATOCRIT: 28.6 % — AB (ref 36.0–46.0)
Hemoglobin: 9.4 g/dL — ABNORMAL LOW (ref 12.0–15.0)
MCH: 27.8 pg (ref 26.0–34.0)
MCHC: 32.9 g/dL (ref 30.0–36.0)
MCV: 84.6 fL (ref 78.0–100.0)
Platelets: 229 10*3/uL (ref 150–400)
RBC: 3.38 MIL/uL — AB (ref 3.87–5.11)
RDW: 17.8 % — ABNORMAL HIGH (ref 11.5–15.5)
WBC: 14.9 10*3/uL — AB (ref 4.0–10.5)

## 2016-12-03 NOTE — Progress Notes (Signed)
PROGRESS NOTE  Rachael [name]  WVP:[id number] DOB: 07/26/1956 DOA: 11/26/2016 PCP: Ronita Hipps, MD   Brief Narrative: Rachael [name] is a 61 y.o. female with a history of HTN, GERD, hypothyroidism, depression, CVA, PAF, CKD stage IV and biopsy-proven interstitial nephritis thought to be due to protonix who presented with nausea, vomiting, leg swelling and generalized weakness found to be in acute renal failure. She had been taking prednisone for IN Dx Jan 2018 which initially required HD, but stopped earlier this month due to nausea and vomiting.   In ED, patient was hemodynamically stable. Blood work was notable for hemoglobin of 10.9, potassium 3.3, creatinine 7.74. Renal U/S showed 7.1cm (R) and 8cm (L) with diffusely increased echogenicity without hydronephrosis. Nephrology was consulted and recommended trial of solumedrol, with plan to insert [facility] and institute long term HD if no renal recovery. Otherwise, may discharge on oral hydrocortisone. Creatinine has shown modest improvement.  Assessment & Plan: Principal Problem:   Acute renal failure superimposed on stage 5 chronic kidney disease, not on chronic dialysis (HCC) Active Problems:   UTI (urinary tract infection)   Nausea and vomiting   HTN (hypertension)   Hypothyroidism   History of CVA (cerebrovascular accident)   Depression   Paroxysmal atrial fibrillation (HCC)  Aute renal failure on CKD stage IV vs. ESRD with interstitial nephritis: Creatinine up 3 > 7.7 on admission with discontinuation of prednisone for AIN (biopsy-proven). Modest improvement continues, encouraging trend. Cr 7.7 > 6.0 - Daily RFP, no indications for urgent HD, making urine.  - Large diuresis 3/25 with lasix.  - Continue IV solumedrol to see if AIN can be treated and avoid need for HD. No hyperglycemia noted.  - Of course, PPI is on allergy list.  - Clip process has been started per nephrology - Iron panel consistent with AOCD, monitor hgb. Nephrology  providing ESA, iron.  Recurrent nausea and vomiting: Possibly due to uremia. GERD/hiatal hernia posibly contributing since off PPI.  - Pepcid - Continue reglan 5 mg TID and phenergan prn (zofran was ineffective) - Will DC IVF as her po intake has return to normal.   Asymptomatic bacteriuria: Has been taking cipro 3/16 - 3/23, will stop this as UCx was nonclonal.  - Monitor for symptoms. - DC'ed cipro  HTN: Chronic, stable. normotensive without medications.  - Monitor  Hypothyroidism: Last TSH was 1.08 on 10/27/16 - Continue home synthroid  History of CVA: Chronic, stable.  -continue plavix and Lipitor  Depression: Chronic, stable. No suicidal or homicidal ideations. - Continue home medications: Citalopram, decrease to 20mg  daily (max dose in this age)  HLD: Chronic, stable.  - Continue lipitor  DVT prophylaxis: Heparin Code Status: Full Family Communication: None at bedside Disposition Plan: Anticipate DC to home once AKI resolving or after clip process completed for ESRD.  Consultants:   Nephrology, Dr. Moshe Cipro  Procedures:   None  Antimicrobials:  None   Subjective: Pt has no new complaints reported to me.  Objective: Vitals:   12/02/16 1655 12/02/16 2005 12/03/16 0554 12/03/16 0951  BP: (!) 150/75 128/73 124/81 121/66  Pulse: 81 87 88 99  Resp: (!) 2 20 19 18   Temp: 98.4 F (36.9 C) 97.8 F (36.6 C) 98.5 F (36.9 C) 98.2 F (36.8 C)  TempSrc: Oral Oral Oral Oral  SpO2: 98% 94% 94% 94%  Weight:      Height:        Intake/Output Summary (Last 24 hours) at 12/03/16 1403 Last data filed at  12/03/16 1100  Gross per 24 hour  Intake              660 ml  Output             1750 ml  Net            -1090 ml   Filed Weights   11/28/16 2211 11/29/16 2038 11/30/16 2002  Weight: 100.5 kg (221 lb 8 oz) 100.3 kg (221 lb 1.6 oz) 100.2 kg (220 lb 14.4 oz)    Examination: General exam: 61 y.o. female in no distress Respiratory system:  Non-labored breathing room air. No wheezes Cardiovascular system: Regular rate and rhythm. No murmur, rub, or gallop. No JVD. Trace edema appears resolved.  Gastrointestinal system: Abdomen soft, obese, non-tender, non-distended, with normoactive bowel sounds. No organomegaly or masses felt. Central nervous system: Alert and oriented. No focal neurological deficits. No asterixis. Extremities: Warm, no deformities Skin: No rashes, lesions no ulcers Psychiatry: Judgement and insight appear normal. Mood & affect appropriate.   Data Reviewed: I have personally reviewed following labs and imaging studies  CBC:  Recent Labs Lab 11/26/16 1951 11/27/16 0536 12/03/16 0643  WBC 9.3 6.9 14.9*  HGB 10.9* 9.0* 9.4*  HCT 32.9* 27.8* 28.6*  MCV 83.1 82.5 84.6  PLT 257 194 245   Basic Metabolic Panel:  Recent Labs Lab 11/29/16 0541 11/30/16 0525 12/01/16 0648 12/02/16 0546 12/03/16 0643  NA 141 141 143 142 141  K 3.9 3.8 3.9 3.6 3.5  CL 117* 114* 119* 115* 115*  CO2 14* 15* 14* 13* 15*  GLUCOSE 94 86 76 76 92  BUN 36* 46* 45* 46* 45*  CREATININE 7.29* 6.84* 6.05* 5.66* 4.98*  CALCIUM 8.2* 8.2* 8.3* 8.3* 8.1*  PHOS 5.0* 4.6 5.2* 4.9* 4.5   GFR: Estimated Creatinine Clearance: 13 mL/min (A) (by C-G formula based on SCr of 4.98 mg/dL (H)). Liver Function Tests:  Recent Labs Lab 11/26/16 1951  11/29/16 0541 11/30/16 0525 12/01/16 8099 12/02/16 0546 12/03/16 0643  AST 16  --   --   --   --   --   --   ALT 15  --   --   --   --   --   --   ALKPHOS 63  --   --   --   --   --   --   BILITOT 0.6  --   --   --   --   --   --   PROT 6.8  --   --   --   --   --   --   ALBUMIN 3.3*  < > 2.7* 2.8* 2.9* 2.9* 3.0*  < > = values in this interval not displayed.  Recent Labs Lab 11/26/16 1951  LIPASE 17   No results for input(s): AMMONIA in the last 168 hours. Coagulation Profile: No results for input(s): INR, PROTIME in the last 168 hours. Cardiac Enzymes: No results for  input(s): CKTOTAL, CKMB, CKMBINDEX, TROPONINI in the last 168 hours. BNP (last 3 results) No results for input(s): PROBNP in the last 8760 hours. HbA1C: No results for input(s): HGBA1C in the last 72 hours. CBG: No results for input(s): GLUCAP in the last 168 hours. Lipid Profile: No results for input(s): CHOL, HDL, LDLCALC, TRIG, CHOLHDL, LDLDIRECT in the last 72 hours. Thyroid Function Tests: No results for input(s): TSH, T4TOTAL, FREET4, T3FREE, THYROIDAB in the last 72 hours. Anemia Panel: No results for input(s): VITAMINB12, FOLATE, FERRITIN,  TIBC, IRON, RETICCTPCT in the last 72 hours. Urine analysis:    Component Value Date/Time   COLORURINE YELLOW 11/26/2016 2008   APPEARANCEUR CLOUDY (A) 11/26/2016 2008   LABSPEC 1.011 11/26/2016 2008   PHURINE 6.0 11/26/2016 2008   GLUCOSEU >=500 (A) 11/26/2016 2008   HGBUR SMALL (A) 11/26/2016 2008   BILIRUBINUR NEGATIVE 11/26/2016 2008   KETONESUR 5 (A) 11/26/2016 2008   PROTEINUR 100 (A) 11/26/2016 2008   NITRITE NEGATIVE 11/26/2016 2008   LEUKOCYTESUR MODERATE (A) 11/26/2016 2008   Recent Results (from the past 240 hour(s))  Urine culture     Status: Abnormal   Collection Time: 11/27/16 12:55 AM  Result Value Ref Range Status   Specimen Description URINE, CLEAN CATCH  Final   Special Requests NONE  Final   Culture MULTIPLE SPECIES PRESENT, SUGGEST RECOLLECTION (A)  Final   Report Status 11/28/2016 FINAL  Final  Culture, blood (Routine X 2) w Reflex to ID Panel     Status: None   Collection Time: 11/27/16  2:32 AM  Result Value Ref Range Status   Specimen Description BLOOD RIGHT ARM  Final   Special Requests BOTTLES DRAWN AEROBIC AND ANAEROBIC 5ML  Final   Culture NO GROWTH 5 DAYS  Final   Report Status 12/02/2016 FINAL  Final  Culture, blood (Routine X 2) w Reflex to ID Panel     Status: None   Collection Time: 11/27/16  2:35 AM  Result Value Ref Range Status   Specimen Description BLOOD LEFT ARM  Final   Special Requests  BOTTLES DRAWN AEROBIC AND ANAEROBIC 5ML  Final   Culture NO GROWTH 5 DAYS  Final   Report Status 12/02/2016 FINAL  Final      Radiology Studies: No results found.  Scheduled Meds: . atorvastatin  40 mg Oral q1800  . citalopram  20 mg Oral Daily  . clopidogrel  75 mg Oral Daily  . famotidine  20 mg Oral Daily  . feeding supplement (NEPRO CARB STEADY)  237 mL Oral BID BM  . furosemide  40 mg Oral Daily  . heparin  5,000 Units Subcutaneous Q8H  . levothyroxine  88 mcg Oral QAC breakfast  . metoCLOPramide  5 mg Oral [id number]  . predniSONE  60 mg Oral Q breakfast  . sodium bicarbonate  1,300 mg Oral TID  . sodium chloride flush  3 mL Intravenous Q12H   Continuous Infusions:    LOS: 6 days   Time spent: 25 minutes.  Velvet Bathe, MD [facility] Pager [phone]  If 7PM-7AM, please contact night-coverage [email or web address] Password [facility] 12/03/2016, 2:03 PM

## 2016-12-03 NOTE — Progress Notes (Signed)
[facility] Rounding Note  Subjective:   Still with good UOP Lasix reduced to once a day Took oral prednisone today - no nausea or GI effects Thinks she will be able to take po alright  Objective Vital signs in last 24 hours: Vitals:   12/02/16 1655 12/02/16 2005 12/03/16 0554 12/03/16 0951  BP: (!) 150/75 128/73 124/81 121/66  Pulse: 81 87 88 99  Resp: (!) 2 20 19 18   Temp: 98.4 F (36.9 C) 97.8 F (36.6 C) 98.5 F (36.9 C) 98.2 F (36.8 C)  TempSrc: Oral Oral Oral Oral  SpO2: 98% 94% 94% 94%  Weight:      Height:       Weight change:   Intake/Output Summary (Last 24 hours) at 12/03/16 1458 Last data filed at 12/03/16 1435  Gross per 24 hour  Intake              900 ml  Output             1750 ml  Net             -850 ml   Physical Exam: Obese, lying in bed in no acute distress Cushingoid Soft spoken Regular rate and rhythm  Lungs clear  Abdomen: Obese, soft, nontender  Extremities: Minimal if any LE edema (has very "chubby" ankles but not much in the way of pitting edema)   Recent Labs Lab 12/01/16 0648 12/02/16 0546 12/03/16 0643  NA 143 142 141  K 3.9 3.6 3.5  CL 119* 115* 115*  CO2 14* 13* 15*  GLUCOSE 76 76 92  BUN 45* 46* 45*  CREATININE 6.05* 5.66* 4.98*  CALCIUM 8.3* 8.3* 8.1*  PHOS 5.2* 4.9* 4.5    Recent Labs Lab 11/26/16 1951  12/01/16 0648 12/02/16 0546 12/03/16 0643  AST 16  --   --   --   --   ALT 15  --   --   --   --   ALKPHOS 63  --   --   --   --   BILITOT 0.6  --   --   --   --   PROT 6.8  --   --   --   --   ALBUMIN 3.3*  < > 2.9* 2.9* 3.0*  < > = values in this interval not displayed.  Recent Labs Lab 11/26/16 1951  LIPASE 17    Recent Labs Lab 11/26/16 1951 11/27/16 0536 12/03/16 0643  WBC 9.3 6.9 14.9*  HGB 10.9* 9.0* 9.4*  HCT 32.9* 27.8* 28.6*  MCV 83.1 82.5 84.6  PLT 257 194 229   Infusions   Scheduled Medications: . atorvastatin  40 mg Oral q1800  . citalopram  20 mg Oral Daily  . clopidogrel  75 mg  Oral Daily  . famotidine  20 mg Oral Daily  . feeding supplement (NEPRO CARB STEADY)  237 mL Oral BID BM  . furosemide  40 mg Oral Daily  . heparin  5,000 Units Subcutaneous Q8H  . levothyroxine  88 mcg Oral QAC breakfast  . metoCLOPramide  5 mg Oral [id number]  . predniSONE  60 mg Oral Q breakfast  . sodium bicarbonate  1,300 mg Oral TID  . sodium chloride flush  3 mL Intravenous Q12H    Assessment/Plan:  1. AKI on CKD - biopsy proven AIN by 09/2016 renal bx  (felt 2/2 protonix) 1. Initially responded to steroids with a nadir creatinine of 3.2 (not too bad for her bilaterally  small 7.1, 8 cm echodense kidneys).  2. She now has suffered AKI on CKD  in the setting of abruptly discontinuing her steroids (was on 40/day and it caused nausea so she stopped) 3. Plan is to continue with trial of steroids again to see if kidney function will continue to improve. 4. Creatinine has actually improved some from 7.5 down to 4.98 so real improvement  5. Had Solumedrol 60/day 3/22 - 3/27. Today changed to po prednisone.  6. Fine with me to send home on 60 mg/day prednisone and Dr. Hollie Salk will call her with appt/labs for next week  2. Metabolic acidosis - CO2 14 (renal failure + tubulointerstitial dx)  1. Started po bicarb 650 TID 3/26. 2. Increased dose to 2 TID - please send out on this 3. HTN/vol- fluid retention secondary to steroids.  1. Blood pressure had been excellent on no meds- now up some maybe due to fluid and steroids 2.  On po lasix BID - reduced to once a day - send out on 40 lasix/day please 3. Stop IVFs (still going at 68) 4. May need to add something like low dose amlodipine (have not done so far) 4. Anemia 1. s/p Feraheme X 2   2. 60 mcg dose of Aranesp on 3/25 5. Secondary hyperparathyroidism- last PTH was 111- phos is 3.6- no bone meds on board  Dispo- OK from renal standpoint to discharge on prednisone 60 mg/day, lasix 40 mg/day, sodium bicarb 2 tabs TID plus her other usual  meds. Dr. Hollie Salk will arrange labs/appt for next week  Jamal Maes, MD [place] Pager 12/03/2016, 2:58 PM

## 2016-12-04 MED ORDER — CITALOPRAM HYDROBROMIDE 20 MG PO TABS: 20.0000 mg | ORAL_TABLET | Freq: Every day | ORAL | 0 refills | Status: DC

## 2016-12-04 MED ORDER — PREDNISONE 20 MG PO TABS: 60.0000 mg | ORAL_TABLET | Freq: Every day | ORAL | 0 refills | Status: DC

## 2016-12-04 MED ORDER — SODIUM BICARBONATE 650 MG PO TABS: 1300.0000 mg | ORAL_TABLET | Freq: Three times a day (TID) | ORAL | 0 refills | Status: DC

## 2016-12-04 MED ORDER — FUROSEMIDE 40 MG PO TABS: 40.0000 mg | ORAL_TABLET | Freq: Every day | ORAL | 0 refills | Status: DC

## 2016-12-04 MED ORDER — METOCLOPRAMIDE HCL 5 MG PO TABS: 5.0000 mg | ORAL_TABLET | ORAL | 0 refills | Status: DC

## 2016-12-04 NOTE — Progress Notes (Signed)
Pt discharged to home, discharge instructions, follow up appointments, medication discussed and reviewed with pt, verbalized understanding. Telemetry discontinued, CCMD notified. Iv discontinued, cath intact, site clean and dry. Pt was escorted out of the unit in wheelchair, took all belongings with her.

## 2016-12-04 NOTE — Discharge Summary (Signed)
Physician Discharge Summary  Rachael [name] JXB:[id number] DOB: April 29, 1956 DOA: 11/26/2016  PCP: Ronita Hipps, MD  Admit date: 11/26/2016 Discharge date: 12/04/2016  Time spent: > 35 minutes  Recommendations for Outpatient Follow-up:  1. Ensure f/u with nephrologist 2. Ensure repeat bmp 3. Pt on steroids as such her last wbc 14.9 pt afebrile    Discharge Diagnoses:  Principal Problem:   Acute renal failure superimposed on stage 5 chronic kidney disease, not on chronic dialysis [facility]) Active Problems:   UTI (urinary tract infection)   Nausea and vomiting   HTN (hypertension)   Hypothyroidism   History of CVA (cerebrovascular accident)   Depression   Paroxysmal atrial fibrillation [facility])   Discharge Condition: stable  Diet recommendation: Heart healthy  Filed Weights   11/29/16 2038 11/30/16 2002 12/03/16 2058  Weight: 100.3 kg (221 lb 1.6 oz) 100.2 kg (220 lb 14.4 oz) 97.2 kg (214 lb 3.2 oz)    History of present illness:   61 y.o. female with medical history significant of hypertension, GERD, hypothyroidism, depression, CVA,CKD-V, interstitial nephritis presumably secondary to Protonix, transient atrial fibrillation during hospitalization, who presents for nausea, vomiting and generalized weakness  Hospital Course:  Aute renal failure on CKD stage IV vs. ESRD with interstitial nephritis:  - Biopsy proven AIN after PPI use - Nephrologist evaluated while in house. Per their evaluation they recommended the following: Dispo- OK from renal standpoint to discharge on prednisone 60 mg/day, lasix 40 mg/day, sodium bicarb 2 tabs TID plus her other usual meds. Dr. Hollie Salk will arrange labs/appt for next week  Recurrent nausea and vomiting: Possibly due to uremia. GERD/hiatal hernia posibly contributing since off PPI.  - Pepcid - Continue reglan 5 mg TID and phenergan prn (zofran was ineffective)  Asymptomatic bacteriuria: Has been taking cipro 3/16 - 3/23, will stop this as UCx was  nonclonal.  - Monitor for symptoms. - DC'ed cipro  HTN: Chronic, stable. normotensive without medications.  - Will recommend low sodium diet on d/c  Hypothyroidism: Last TSH was 1.08 on 10/27/16 - Continue home synthroid  History of CVA: Chronic, stable.  -continue plavixand Lipitor  Depression:Chronic, stable. No suicidal or homicidal ideations. - Continue home medications: Citalopram, decrease to 20mg  daily (max dose in this age)  HLD: Chronic, stable.  - Continue lipitor  Procedures:  None  Consultations:  Nephrology  Discharge Exam: Vitals:   12/03/16 2058 12/04/16 0439  BP: 137/81 (!) 153/84  Pulse: 81 75  Resp: 17 17  Temp: 98.3 F (36.8 C) 98.3 F (36.8 C)    General: Pt in nad, alert and awake Cardiovascular: rrr, no rubs Respiratory: no increased wob, no wheezes  Discharge Instructions   Discharge Instructions    Call MD for:  redness, tenderness, or signs of infection (pain, swelling, redness, odor or green/yellow discharge around incision site)    Complete by:  As directed    Call MD for:  temperature >100.4    Complete by:  As directed    Diet - low sodium heart healthy    Complete by:  As directed    Discharge instructions    Complete by:  As directed    Please follow up with Nephrologist for further evaluation and recommendations.   Increase activity slowly    Complete by:  As directed      Current Discharge Medication List    START taking these medications   Details  furosemide (LASIX) 40 MG tablet Take 1 tablet (40 mg total) by mouth daily.  Qty: 30 tablet, Refills: 0    metoCLOPramide (REGLAN) 5 MG tablet Take 1 tablet (5 mg total) by mouth 3 (three) times daily at 8am, 3pm and bedtime. Qty: 90 tablet, Refills: 0    sodium bicarbonate 650 MG tablet Take 2 tablets (1,300 mg total) by mouth 3 (three) times daily. Qty: 120 tablet, Refills: 0      CONTINUE these medications which have CHANGED   Details  citalopram (CELEXA)  20 MG tablet Take 1 tablet (20 mg total) by mouth daily. Qty: 30 tablet, Refills: 0    predniSONE (DELTASONE) 20 MG tablet Take 3 tablets (60 mg total) by mouth daily with breakfast. Qty: 45 tablet, Refills: 0      CONTINUE these medications which have NOT CHANGED   Details  atorvastatin (LIPITOR) 40 MG tablet Take 40 mg by mouth daily.     clopidogrel (PLAVIX) 75 MG tablet Take 75 mg by mouth daily.    famotidine (PEPCID) 20 MG tablet Take 1 tablet (20 mg total) by mouth 2 (two) times daily. Qty: 60 tablet, Refills: 0    levothyroxine (SYNTHROID, LEVOTHROID) 88 MCG tablet Take 1 tablet (88 mcg total) by mouth daily before breakfast. Qty: 30 tablet, Refills: 1      STOP taking these medications     ciprofloxacin (CIPRO) 250 MG tablet      metoprolol tartrate (LOPRESSOR) 25 MG tablet        Allergies  Allergen Reactions  . Proton Pump Inhibitors     Biopsy proven acute interstitial nephritis; required dialysis in 09/2016  . Protonix [Pantoprazole] Anaphylaxis  . Penicillins Rash and Other (See Comments)    Tolerates rocephin  . Sulfa Antibiotics Rash      The results of significant diagnostics from this hospitalization (including imaging, microbiology, ancillary and laboratory) are listed below for reference.    Significant Diagnostic Studies: Dg Chest 2 View  Result Date: 11/26/2016 CLINICAL DATA:  Shortness of breath EXAM: CHEST  2 VIEW COMPARISON:  10/23/2016 FINDINGS: The right lung is grossly clear. There is streaky atelectasis at the left lung base. There is borderline cardiomegaly. No pneumothorax. IMPRESSION: Minimal left basilar atelectasis. Electronically Signed   By: Donavan Foil M.D.   On: 11/26/2016 23:33   US Renal  Result Date: 11/27/2016 CLINICAL DATA:  Acute onset of renal insufficiency. Initial encounter. EXAM: RENAL / URINARY TRACT ULTRASOUND COMPLETE COMPARISON:  Renal ultrasound performed 09/21/2016, and CT of the abdomen and pelvis from 09/19/2016  FINDINGS: Right Kidney: Length: 7.1 cm. Diffusely increased parenchymal echogenicity noted. No mass or hydronephrosis visualized. Left Kidney: Length: 8.0 cm. Diffusely increased parenchymal echogenicity noted. No mass or hydronephrosis visualized. Bladder: Appears normal for degree of bladder distention. IMPRESSION: 1. No evidence of hydronephrosis. 2. Diffusely increased renal parenchymal echogenicity raises concern for medical renal disease. Electronically Signed   By: Garald Balding M.D.   On: 11/27/2016 02:19    Microbiology: Recent Results (from the past 240 hour(s))  Urine culture     Status: Abnormal   Collection Time: 11/27/16 12:55 AM  Result Value Ref Range Status   Specimen Description URINE, CLEAN CATCH  Final   Special Requests NONE  Final   Culture MULTIPLE SPECIES PRESENT, SUGGEST RECOLLECTION (A)  Final   Report Status 11/28/2016 FINAL  Final  Culture, blood (Routine X 2) w Reflex to ID Panel     Status: None   Collection Time: 11/27/16  2:32 AM  Result Value Ref Range Status   Specimen Description  BLOOD RIGHT ARM  Final   Special Requests BOTTLES DRAWN AEROBIC AND ANAEROBIC 5ML  Final   Culture NO GROWTH 5 DAYS  Final   Report Status 12/02/2016 FINAL  Final  Culture, blood (Routine X 2) w Reflex to ID Panel     Status: None   Collection Time: 11/27/16  2:35 AM  Result Value Ref Range Status   Specimen Description BLOOD LEFT ARM  Final   Special Requests BOTTLES DRAWN AEROBIC AND ANAEROBIC 5ML  Final   Culture NO GROWTH 5 DAYS  Final   Report Status 12/02/2016 FINAL  Final     Labs: Basic Metabolic Panel:  Recent Labs Lab 11/29/16 0541 11/30/16 0525 12/01/16 0648 12/02/16 0546 12/03/16 0643  NA 141 141 143 142 141  K 3.9 3.8 3.9 3.6 3.5  CL 117* 114* 119* 115* 115*  CO2 14* 15* 14* 13* 15*  GLUCOSE 94 86 76 76 92  BUN 36* 46* 45* 46* 45*  CREATININE 7.29* 6.84* 6.05* 5.66* 4.98*  CALCIUM 8.2* 8.2* 8.3* 8.3* 8.1*  PHOS 5.0* 4.6 5.2* 4.9* 4.5   Liver  Function Tests:  Recent Labs Lab 11/29/16 0541 11/30/16 0525 12/01/16 0648 12/02/16 0546 12/03/16 0643  ALBUMIN 2.7* 2.8* 2.9* 2.9* 3.0*   No results for input(s): LIPASE, AMYLASE in the last 168 hours. No results for input(s): AMMONIA in the last 168 hours. CBC:  Recent Labs Lab 12/03/16 0643  WBC 14.9*  HGB 9.4*  HCT 28.6*  MCV 84.6  PLT 229   Cardiac Enzymes: No results for input(s): CKTOTAL, CKMB, CKMBINDEX, TROPONINI in the last 168 hours. BNP: BNP (last 3 results) No results for input(s): BNP in the last 8760 hours.  ProBNP (last 3 results) No results for input(s): PROBNP in the last 8760 hours.  CBG: No results for input(s): GLUCAP in the last 168 hours.  Signed:  Velvet Bathe MD.  [facility] 12/04/2016, 9:27 AM

## 2016-12-11 DIAGNOSIS — I129 Hypertensive chronic kidney disease with stage 1 through stage 4 chronic kidney disease, or unspecified chronic kidney disease: Secondary | ICD-10-CM | POA: Diagnosis not present

## 2016-12-11 DIAGNOSIS — N10 Acute tubulo-interstitial nephritis: Secondary | ICD-10-CM | POA: Diagnosis not present

## 2016-12-11 DIAGNOSIS — D649 Anemia, unspecified: Secondary | ICD-10-CM | POA: Diagnosis not present

## 2016-12-11 DIAGNOSIS — R112 Nausea with vomiting, unspecified: Secondary | ICD-10-CM | POA: Diagnosis not present

## 2016-12-11 DIAGNOSIS — E669 Obesity, unspecified: Secondary | ICD-10-CM | POA: Diagnosis not present

## 2017-01-16 ENCOUNTER — Inpatient Hospital Stay (HOSPITAL_COMMUNITY): Payer: Medicare HMO

## 2017-01-16 ENCOUNTER — Inpatient Hospital Stay (HOSPITAL_COMMUNITY)
Admission: EM | Admit: 2017-01-16 | Discharge: 2017-01-26 | DRG: 871 | Disposition: A | Discharge status: Skilled Nursing Facility | Payer: Medicare HMO | Attending: Internal Medicine | Admitting: Internal Medicine

## 2017-01-16 ENCOUNTER — Encounter (HOSPITAL_COMMUNITY): Payer: Self-pay

## 2017-01-16 ENCOUNTER — Emergency Department (HOSPITAL_COMMUNITY): Payer: Medicare HMO

## 2017-01-16 DIAGNOSIS — I2699 Other pulmonary embolism without acute cor pulmonale: Secondary | ICD-10-CM | POA: Diagnosis present

## 2017-01-16 DIAGNOSIS — A419 Sepsis, unspecified organism: Principal | ICD-10-CM | POA: Diagnosis present

## 2017-01-16 DIAGNOSIS — I82403 Acute embolism and thrombosis of unspecified deep veins of lower extremity, bilateral: Secondary | ICD-10-CM | POA: Diagnosis not present

## 2017-01-16 DIAGNOSIS — R778 Other specified abnormalities of plasma proteins: Secondary | ICD-10-CM | POA: Diagnosis present

## 2017-01-16 DIAGNOSIS — E039 Hypothyroidism, unspecified: Secondary | ICD-10-CM | POA: Diagnosis present

## 2017-01-16 DIAGNOSIS — D638 Anemia in other chronic diseases classified elsewhere: Secondary | ICD-10-CM | POA: Diagnosis present

## 2017-01-16 DIAGNOSIS — R6520 Severe sepsis without septic shock: Secondary | ICD-10-CM | POA: Diagnosis not present

## 2017-01-16 DIAGNOSIS — R748 Abnormal levels of other serum enzymes: Secondary | ICD-10-CM | POA: Diagnosis not present

## 2017-01-16 DIAGNOSIS — Z6841 Body Mass Index (BMI) 40.0 and over, adult: Secondary | ICD-10-CM

## 2017-01-16 DIAGNOSIS — J9601 Acute respiratory failure with hypoxia: Secondary | ICD-10-CM | POA: Diagnosis not present

## 2017-01-16 DIAGNOSIS — I5032 Chronic diastolic (congestive) heart failure: Secondary | ICD-10-CM | POA: Diagnosis present

## 2017-01-16 DIAGNOSIS — R2689 Other abnormalities of gait and mobility: Secondary | ICD-10-CM | POA: Diagnosis not present

## 2017-01-16 DIAGNOSIS — M6281 Muscle weakness (generalized): Secondary | ICD-10-CM | POA: Diagnosis not present

## 2017-01-16 DIAGNOSIS — J189 Pneumonia, unspecified organism: Secondary | ICD-10-CM | POA: Diagnosis not present

## 2017-01-16 DIAGNOSIS — I517 Cardiomegaly: Secondary | ICD-10-CM

## 2017-01-16 DIAGNOSIS — F320 Major depressive disorder, single episode, mild: Secondary | ICD-10-CM | POA: Diagnosis not present

## 2017-01-16 DIAGNOSIS — I5033 Acute on chronic diastolic (congestive) heart failure: Secondary | ICD-10-CM

## 2017-01-16 DIAGNOSIS — D631 Anemia in chronic kidney disease: Secondary | ICD-10-CM | POA: Diagnosis not present

## 2017-01-16 DIAGNOSIS — F32A Depression, unspecified: Secondary | ICD-10-CM | POA: Diagnosis present

## 2017-01-16 DIAGNOSIS — I130 Hypertensive heart and chronic kidney disease with heart failure and stage 1 through stage 4 chronic kidney disease, or unspecified chronic kidney disease: Secondary | ICD-10-CM | POA: Diagnosis present

## 2017-01-16 DIAGNOSIS — I82503 Chronic embolism and thrombosis of unspecified deep veins of lower extremity, bilateral: Secondary | ICD-10-CM | POA: Diagnosis not present

## 2017-01-16 DIAGNOSIS — R7989 Other specified abnormal findings of blood chemistry: Secondary | ICD-10-CM | POA: Diagnosis present

## 2017-01-16 DIAGNOSIS — F329 Major depressive disorder, single episode, unspecified: Secondary | ICD-10-CM | POA: Diagnosis present

## 2017-01-16 DIAGNOSIS — Z8673 Personal history of transient ischemic attack (TIA), and cerebral infarction without residual deficits: Secondary | ICD-10-CM

## 2017-01-16 DIAGNOSIS — R791 Abnormal coagulation profile: Secondary | ICD-10-CM | POA: Diagnosis not present

## 2017-01-16 DIAGNOSIS — Z79899 Other long term (current) drug therapy: Secondary | ICD-10-CM

## 2017-01-16 DIAGNOSIS — D696 Thrombocytopenia, unspecified: Secondary | ICD-10-CM | POA: Diagnosis present

## 2017-01-16 DIAGNOSIS — I509 Heart failure, unspecified: Secondary | ICD-10-CM

## 2017-01-16 DIAGNOSIS — I82493 Acute embolism and thrombosis of other specified deep vein of lower extremity, bilateral: Secondary | ICD-10-CM | POA: Diagnosis not present

## 2017-01-16 DIAGNOSIS — I951 Orthostatic hypotension: Secondary | ICD-10-CM | POA: Clinically undetermined

## 2017-01-16 DIAGNOSIS — R0602 Shortness of breath: Secondary | ICD-10-CM | POA: Diagnosis not present

## 2017-01-16 DIAGNOSIS — R0902 Hypoxemia: Secondary | ICD-10-CM | POA: Diagnosis not present

## 2017-01-16 DIAGNOSIS — I824Y3 Acute embolism and thrombosis of unspecified deep veins of proximal lower extremity, bilateral: Secondary | ICD-10-CM | POA: Diagnosis not present

## 2017-01-16 DIAGNOSIS — N184 Chronic kidney disease, stage 4 (severe): Secondary | ICD-10-CM | POA: Diagnosis present

## 2017-01-16 DIAGNOSIS — I744 Embolism and thrombosis of arteries of extremities, unspecified: Secondary | ICD-10-CM | POA: Diagnosis not present

## 2017-01-16 DIAGNOSIS — I371 Nonrheumatic pulmonary valve insufficiency: Secondary | ICD-10-CM

## 2017-01-16 DIAGNOSIS — I959 Hypotension, unspecified: Secondary | ICD-10-CM | POA: Diagnosis not present

## 2017-01-16 DIAGNOSIS — J810 Acute pulmonary edema: Secondary | ICD-10-CM | POA: Diagnosis not present

## 2017-01-16 HISTORY — DX: Heart failure, unspecified: I50.9

## 2017-01-16 HISTORY — DX: Dyspnea, unspecified: R06.00

## 2017-01-16 HISTORY — DX: Sepsis, unspecified organism: A41.9

## 2017-01-16 HISTORY — DX: Chronic kidney disease, stage 4 (severe): N18.4

## 2017-01-16 HISTORY — DX: Other specified abnormal findings of blood chemistry: R79.89

## 2017-01-16 LAB — BASIC METABOLIC PANEL
ANION GAP: 13 (ref 5–15)
BUN: 34 mg/dL — ABNORMAL HIGH (ref 6–20)
CALCIUM: 9.3 mg/dL (ref 8.9–10.3)
CO2: 18 mmol/L — ABNORMAL LOW (ref 22–32)
Chloride: 108 mmol/L (ref 101–111)
Creatinine, Ser: 2.85 mg/dL — ABNORMAL HIGH (ref 0.44–1.00)
GFR calc Af Amer: 19 mL/min — ABNORMAL LOW (ref >60)
GFR, EST NON AFRICAN AMERICAN: 17 mL/min — AB (ref >60)
GLUCOSE: 158 mg/dL — AB (ref 65–99)
Potassium: 3.8 mmol/L (ref 3.5–5.1)
SODIUM: 139 mmol/L (ref 135–145)

## 2017-01-16 LAB — TROPONIN I
TROPONIN I: 0.08 ng/mL — AB (ref <0.03)
Troponin I: 0.08 ng/mL (ref <0.03)

## 2017-01-16 LAB — CBC WITH DIFFERENTIAL/PLATELET
BASOS ABS: 0.1 10*3/uL (ref 0.0–0.1)
Basophils Relative: 1 %
EOS ABS: 0 10*3/uL (ref 0.0–0.7)
Eosinophils Relative: 0 %
HCT: 36.2 % (ref 36.0–46.0)
Hemoglobin: 11.7 g/dL — ABNORMAL LOW (ref 12.0–15.0)
LYMPHS ABS: 1.1 10*3/uL (ref 0.7–4.0)
Lymphocytes Relative: 9 %
MCH: 29.6 pg (ref 26.0–34.0)
MCHC: 32.3 g/dL (ref 30.0–36.0)
MCV: 91.6 fL (ref 78.0–100.0)
Monocytes Absolute: 1 10*3/uL (ref 0.1–1.0)
Monocytes Relative: 8 %
NEUTROS ABS: 10.2 10*3/uL — AB (ref 1.7–7.7)
Neutrophils Relative %: 82 %
PLATELETS: 149 10*3/uL — AB (ref 150–400)
RBC: 3.95 MIL/uL (ref 3.87–5.11)
RDW: 16.7 % — ABNORMAL HIGH (ref 11.5–15.5)
WBC: 12.4 10*3/uL — AB (ref 4.0–10.5)

## 2017-01-16 LAB — LACTIC ACID, PLASMA
LACTIC ACID, VENOUS: 2.9 mmol/L — AB (ref 0.5–1.9)
Lactic Acid, Venous: 3.4 mmol/L (ref 0.5–1.9)
Lactic Acid, Venous: 2.2 mmol/L (ref 0.5–1.9)

## 2017-01-16 LAB — URINALYSIS, ROUTINE W REFLEX MICROSCOPIC
Bilirubin Urine: NEGATIVE
GLUCOSE, UA: NEGATIVE mg/dL
Ketones, ur: NEGATIVE mg/dL
Leukocytes, UA: NEGATIVE
Nitrite: NEGATIVE
PROTEIN: NEGATIVE mg/dL
Specific Gravity, Urine: 1.005 (ref 1.005–1.030)
pH: 5 (ref 5.0–8.0)

## 2017-01-16 LAB — BRAIN NATRIURETIC PEPTIDE: B Natriuretic Peptide: 496.3 pg/mL — ABNORMAL HIGH (ref 0.0–100.0)

## 2017-01-16 LAB — APTT: aPTT: 20 seconds — ABNORMAL LOW (ref 24–36)

## 2017-01-16 LAB — I-STAT CG4 LACTIC ACID, ED: Lactic Acid, Venous: 2.68 mmol/L (ref 0.5–1.9)

## 2017-01-16 LAB — TSH: TSH: 4.12 u[IU]/mL (ref 0.350–4.500)

## 2017-01-16 LAB — HEPARIN LEVEL (UNFRACTIONATED)
Heparin Unfractionated: 1.64 IU/mL — ABNORMAL HIGH (ref 0.30–0.70)
Heparin Unfractionated: 1.09 IU/mL — ABNORMAL HIGH (ref 0.30–0.70)

## 2017-01-16 LAB — PROCALCITONIN

## 2017-01-16 LAB — MRSA PCR SCREENING: MRSA BY PCR: NEGATIVE

## 2017-01-16 MED ORDER — LEVOTHYROXINE SODIUM 88 MCG PO TABS
88.0000 ug | ORAL_TABLET | Freq: Every day | ORAL | Status: DC
  Administered 2017-01-16 – 2017-01-26 (×11): 88 ug via ORAL
  Filled 2017-01-16 (×11): qty 1

## 2017-01-16 MED ORDER — SODIUM CHLORIDE 0.9% FLUSH
3.0000 mL | INTRAVENOUS | Status: DC | PRN
  Administered 2017-01-22: 3 mL via INTRAVENOUS
  Filled 2017-01-16: qty 3

## 2017-01-16 MED ORDER — SODIUM CHLORIDE 0.9% FLUSH
3.0000 mL | Freq: Two times a day (BID) | INTRAVENOUS | Status: DC
  Administered 2017-01-16 – 2017-01-26 (×21): 3 mL via INTRAVENOUS

## 2017-01-16 MED ORDER — ACETAMINOPHEN 325 MG PO TABS
650.0000 mg | ORAL_TABLET | ORAL | Status: DC | PRN
Start: 2017-01-16 — End: 2017-01-26

## 2017-01-16 MED ORDER — SODIUM CHLORIDE 0.9 % IV SOLN: 250.0000 mL | INTRAVENOUS | Status: DC | PRN

## 2017-01-16 MED ORDER — IPRATROPIUM-ALBUTEROL 0.5-2.5 (3) MG/3ML IN SOLN
3.0000 mL | RESPIRATORY_TRACT | Status: DC | PRN
Start: 2017-01-16 — End: 2017-01-26

## 2017-01-16 MED ORDER — VANCOMYCIN HCL IN DEXTROSE 750-5 MG/150ML-% IV SOLN
750.0000 mg | INTRAVENOUS | Status: DC
  Administered 2017-01-17 – 2017-01-19 (×3): 750 mg via INTRAVENOUS
  Filled 2017-01-16 (×3): qty 150

## 2017-01-16 MED ORDER — FUROSEMIDE 10 MG/ML IJ SOLN
20.0000 mg | INTRAMUSCULAR | Status: AC
  Administered 2017-01-16: 20 mg via INTRAVENOUS
  Filled 2017-01-16: qty 2

## 2017-01-16 MED ORDER — CITALOPRAM HYDROBROMIDE 20 MG PO TABS
20.0000 mg | ORAL_TABLET | Freq: Every day | ORAL | Status: DC
  Administered 2017-01-16 – 2017-01-26 (×11): 20 mg via ORAL
  Filled 2017-01-16 (×11): qty 1

## 2017-01-16 MED ORDER — TECHNETIUM TO 99M ALBUMIN AGGREGATED
4.1100 | Freq: Once | INTRAVENOUS | Status: AC | PRN
  Administered 2017-01-16: 4.11 via INTRAVENOUS

## 2017-01-16 MED ORDER — LEVOFLOXACIN IN D5W 500 MG/100ML IV SOLN
500.0000 mg | INTRAVENOUS | Status: DC
  Administered 2017-01-18: 500 mg via INTRAVENOUS
  Filled 2017-01-16: qty 100

## 2017-01-16 MED ORDER — FAMOTIDINE 20 MG PO TABS: 20.0000 mg | ORAL_TABLET | Freq: Two times a day (BID) | ORAL | Status: DC | PRN

## 2017-01-16 MED ORDER — VANCOMYCIN HCL IN DEXTROSE 1-5 GM/200ML-% IV SOLN
1000.0000 mg | Freq: Once | INTRAVENOUS | Status: AC
  Administered 2017-01-16: 1000 mg via INTRAVENOUS
  Filled 2017-01-16: qty 200

## 2017-01-16 MED ORDER — ALBUTEROL SULFATE (2.5 MG/3ML) 0.083% IN NEBU
5.0000 mg | INHALATION_SOLUTION | Freq: Once | RESPIRATORY_TRACT | Status: AC
  Administered 2017-01-16: 5 mg via RESPIRATORY_TRACT
  Filled 2017-01-16: qty 6

## 2017-01-16 MED ORDER — ONDANSETRON HCL 4 MG/2ML IJ SOLN: 4.0000 mg | Freq: Four times a day (QID) | INTRAMUSCULAR | Status: DC | PRN

## 2017-01-16 MED ORDER — HEPARIN (PORCINE) IN NACL 100-0.45 UNIT/ML-% IJ SOLN
1000.0000 [IU]/h | INTRAMUSCULAR | Status: DC
  Administered 2017-01-16: 1000 [IU]/h via INTRAVENOUS
  Filled 2017-01-16: qty 250

## 2017-01-16 MED ORDER — HEPARIN (PORCINE) IN NACL 100-0.45 UNIT/ML-% IJ SOLN
750.0000 [IU]/h | INTRAMUSCULAR | Status: DC
  Administered 2017-01-16: 750 [IU]/h via INTRAVENOUS

## 2017-01-16 MED ORDER — HEPARIN BOLUS VIA INFUSION
4500.0000 [IU] | Freq: Once | INTRAVENOUS | Status: AC
  Administered 2017-01-16: 4500 [IU] via INTRAVENOUS
  Filled 2017-01-16: qty 4500

## 2017-01-16 MED ORDER — PREDNISONE 20 MG PO TABS
30.0000 mg | ORAL_TABLET | Freq: Every day | ORAL | Status: DC
  Administered 2017-01-16 – 2017-01-22 (×7): 30 mg via ORAL
  Administered 2017-01-23 – 2017-01-25 (×3): 20 mg via ORAL
  Filled 2017-01-16 (×10): qty 1

## 2017-01-16 MED ORDER — METOCLOPRAMIDE HCL 10 MG PO TABS: 5.0000 mg | ORAL_TABLET | Freq: Three times a day (TID) | ORAL | Status: DC | PRN

## 2017-01-16 MED ORDER — TECHNETIUM TC 99M DIETHYLENETRIAME-PENTAACETIC ACID
30.1000 | Freq: Once | INTRAVENOUS | Status: AC | PRN
  Administered 2017-01-16: 30.1 via RESPIRATORY_TRACT

## 2017-01-16 MED ORDER — SODIUM BICARBONATE 650 MG PO TABS
650.0000 mg | ORAL_TABLET | Freq: Two times a day (BID) | ORAL | Status: DC
  Administered 2017-01-16 – 2017-01-26 (×20): 650 mg via ORAL
  Filled 2017-01-16 (×20): qty 1

## 2017-01-16 MED ORDER — LEVOFLOXACIN IN D5W 750 MG/150ML IV SOLN
750.0000 mg | Freq: Once | INTRAVENOUS | Status: AC
  Administered 2017-01-16: 750 mg via INTRAVENOUS
  Filled 2017-01-16: qty 150

## 2017-01-16 NOTE — Progress Notes (Signed)
CRITICAL VALUE ALERT  Critical value received:  Lactic Acid 3.4  Date of notification:  01/16/2017  Time of notification:  0707  Critical value read back:Yes.    Nurse who received alert:  Brien Mates RN  MD notified (1st page):  Bhandari  Time of first page:  0707  MD notified (2nd page):  Time of second page:  Responding MD:  Carolin Sicks  Time MD responded:  [phone]

## 2017-01-16 NOTE — ED Notes (Signed)
Removed non-rebreather and placed pt on 4L [place]. Now 95%.

## 2017-01-16 NOTE — Care Management Note (Signed)
Case Management Note  Patient Details  Name: Rachael [name] MRN: [id number] Date of Birth: Mar 08, 1956  Subjective/Objective:   Presents with poss sepsis, acute resp failure, presumed chf, chronic kidney dz, hypothyroidism,   bil dvt, on heparin drip.                  Action/Plan: NCM will follow for dc needs.  Expected Discharge Date:                  Expected Discharge Plan:  Home/Self Care  In-House Referral:     Discharge planning Services  CM Consult  Post Acute Care Choice:    Choice offered to:     DME Arranged:    DME Agency:     HH Arranged:    HH Agency:     Status of Service:  In process, will continue to follow  If discussed at Long Length of Stay Meetings, dates discussed:    Additional Comments:  Zenon Mayo, RN 01/16/2017, 4:29 PM

## 2017-01-16 NOTE — Progress Notes (Signed)
ANTICOAGULATION CONSULT NOTE - Follow Up Consult  Pharmacy Consult for Heparin Indication: pulmonary embolus and DVT  Allergies  Allergen Reactions  . Proton Pump Inhibitors     Biopsy proven acute interstitial nephritis; required dialysis in 09/2016  . Protonix [Pantoprazole] Anaphylaxis  . Penicillins Rash and Other (See Comments)    Tolerates rocephin  . Sulfa Antibiotics Rash    Patient Measurements: Height: 5\' 2"  (157.5 cm) Weight: 256 lb 2.8 oz (116.2 kg) IBW/kg (Calculated) : 50.1 Heparin Dosing Weight: 79 kg  Vital Signs: Temp: 97.7 F (36.5 C) (05/11 1100) Temp Source: Oral (05/11 1100) BP: 119/76 (05/11 1100) Pulse Rate: 100 (05/11 1100)  Labs:  Recent Labs  01/16/17 0139 01/16/17 0555 01/16/17 1401  HGB 11.7*  --   --   HCT 36.2  --   --   PLT 149*  --   --   APTT 20*  --   --   HEPARINUNFRC  --   --  1.64*  CREATININE 2.85*  --   --   TROPONINI 0.08* 0.08*  --     Estimated Creatinine Clearance: 24.7 mL/min (A) (by C-G formula based on SCr of 2.85 mg/dL (H)).   Medications:  Infusions:  . sodium chloride    . heparin 1,000 Units/hr (01/16/17 0629)  . [START ON 01/18/2017] levofloxacin (LEVAQUIN) IV    . [START ON 01/17/2017] vancomycin      Assessment: 61 year old female with bilateral DVTs and presumed PE.  She is on anticoagulation with heparin and her initial heparin level is supratherapeutic.  No bleeding noted.  Goal of Therapy:  Heparin level 0.3-0.7 units/ml Monitor platelets by anticoagulation protocol: Yes   Plan:  Hold heparin x 30 minutes Restart Heparin at 750 units/hr Check Heparin level 6 hours after restarting heparin  Norva Riffle 01/16/2017,3:26 PM

## 2017-01-16 NOTE — ED Provider Notes (Signed)
[name] DEPT Provider Note   CSN: [id number] Arrival date & time: 01/16/17  0101  By signing my name below, I, Rachael [name], attest that this documentation has been prepared under the direction and in the presence of Rachael Biles, MD . Electronically Signed: Dolores [name], Scribe. 01/16/2017. 1:18 AM.  History   Chief Complaint Chief Complaint  Patient presents with  . Shortness of Breath   The history is provided by the patient and the EMS personnel. No language interpreter was used.    HPI Comments:  Rachael [name] is a 61 y.o. female with pmhx of CHF, TIA, and HTN who presents to the Emergency Department by ambulance complaining of sudden-onset, constant SOB onset just PTA. Per EMS, pt O2 was in the 80s on when they arrived. She was given 15L of oxygen with relief of symptoms. When lowered to 4L of oxygen, Pt's O2 stats fell again to the 80s. EMS state the pt has been tachycardic since their arrival with a HR that has not dropped below 120 and worsened to 150 on minimal exertion. Her capnography remained 13-15 en route. She reports that she has been experiencing exertional SOB since dx of acute renal failure in January 2018 but her symptoms today were particularly severe. Pt reports associated LE swelling. Denies any CP, calf pain, or bleeding problems. Pt also denies any hx of blood clots, recent shx, recent long distance travel, or any HRT/Estrogen/BC.    Past Medical History:  Diagnosis Date  . CHF (congestive heart failure) ([place])    Heart cath in 2006  . CKD (chronic kidney disease)    Baseline creat 1.4-1.7  . CVA (cerebral vascular accident) ([place])    No residual deficit  . HTN (hypertension)   . Hypothyroidism     Patient Active Problem List   Diagnosis Date Noted  . Paroxysmal atrial fibrillation ([place]) 10/30/2016  . Interstitial nephritis   . Hemodialysis catheter infection ([place])   . History of CVA (cerebrovascular accident) 10/24/2016  . Depression 10/24/2016    . Hyperlipidemia 10/24/2016  . Bacteremia 10/23/2016  . Chronic kidney disease (CKD), stage V ([place]) 10/23/2016  . Acute renal failure superimposed on stage 5 chronic kidney disease, not on chronic dialysis ([place]) 09/22/2016  . UTI (urinary tract infection) 09/22/2016  . Nausea and vomiting 09/22/2016  . HTN (hypertension) 09/22/2016  . Hypothyroidism 09/22/2016  . AKI (acute kidney injury) ([place]) 09/22/2016  . Pruritus 05/16/2015  . Urticaria 05/16/2015    Past Surgical History:  Procedure Laterality Date  . CARDIAC CATHETERIZATION  2006  . CHOLECYSTECTOMY    . IR GENERIC HISTORICAL  09/29/2016   IR FLUORO GUIDE CV LINE RIGHT 09/29/2016 Rachael Cadet, MD [facility]  . IR GENERIC HISTORICAL  09/29/2016   IR US GUIDE VASC ACCESS RIGHT [facility]  . TEE WITHOUT CARDIOVERSION N/A 10/27/2016   Procedure: TRANSESOPHAGEAL ECHOCARDIOGRAM (TEE);  Surgeon: Rachael Spark, MD;  Location: [facility];  Service: Cardiovascular;  Laterality: N/A;    OB History    No data available       Home Medications    Prior to Admission medications   Medication Sig Start Date End Date Taking? Authorizing Provider  citalopram (CELEXA) 20 MG tablet Take 1 tablet (20 mg total) by mouth daily. 12/04/16  Yes Rachael Bathe, MD  famotidine (PEPCID) 20 MG tablet Take 1 tablet (20 mg total) by mouth 2 (two) times daily. Patient taking differently: Take 20 mg by mouth 2 (two) times daily as needed  for heartburn.  10/14/16  Yes Rachael Monte, MD  levothyroxine (SYNTHROID, LEVOTHROID) 88 MCG tablet Take 1 tablet (88 mcg total) by mouth daily before breakfast. 10/31/16  Yes Rachael Dumas, MD  metoCLOPramide (REGLAN) 5 MG tablet Take 1 tablet (5 mg total) by mouth 3 (three) times daily at 8am, 3pm and bedtime. Patient taking differently: Take 5 mg by mouth 3 (three) times daily as needed for nausea or vomiting.  12/04/16  Yes Rachael Bathe, MD  predniSONE (DELTASONE) 20 MG tablet Take 3 tablets (60 mg total) by  mouth daily with breakfast. Patient taking differently: Take 30 mg by mouth daily with breakfast.  12/05/16  Yes Rachael Bathe, MD    Family History Family History  Problem Relation Age of Onset  . Hypertension Mother   . Stroke Mother   . Coronary artery disease Mother        Late onset  . Hypertension Father   . Coronary artery disease Father        Late onset  . Hypertension Sister   . Hypertension Brother     Social History Social History  Substance Use Topics  . Smoking status: Never Smoker  . Smokeless tobacco: Never Used  . Alcohol use No     Allergies   Proton pump inhibitors; Protonix [pantoprazole]; Penicillins; and Sulfa antibiotics   Review of Systems Review of Systems All other systems reviewed and are negative for acute change except as noted in the HPI.  Physical Exam Updated Vital Signs BP (!) 145/90   Pulse (!) 110   Temp 97.9 F (36.6 C) (Oral)   Resp 18   Ht 5\' 2"  (1.575 m)   Wt 215 lb (97.5 kg)   SpO2 95%   BMI 39.32 kg/m   Physical Exam  Constitutional: She is oriented to person, place, and time. She appears well-developed and well-nourished. No distress.  HENT:  Head: Normocephalic and atraumatic.  Eyes: EOM are normal.  Neck: Normal range of motion. No JVD present.  Cardiovascular: Regular rhythm and normal heart sounds.  Tachycardia present.   No murmur heard. DP pulses 2+.  Pulmonary/Chest: Effort normal. She has rales.  Mild rales over right side, no wheezing.   Abdominal: Soft. She exhibits no distension. There is no tenderness.  Musculoskeletal: Normal range of motion.  Bilateral lower extremity edema, left worse than right. No pitting.   Neurological: She is alert and oriented to person, place, and time.  Skin: Skin is warm and dry.  Psychiatric: She has a normal mood and affect. Judgment normal.  Nursing note and vitals reviewed.    ED Treatments / Results  DIAGNOSTIC STUDIES:  Oxygen Saturation is 100% on RA, normal  by my interpretation.    COORDINATION OF CARE:  1:19 AM Discussed treatment plan with pt at bedside which includes admission and pt agreed to plan.  Labs (all labs ordered are listed, but only abnormal results are displayed) Labs Reviewed  BASIC METABOLIC PANEL - Abnormal; Notable for the following:       Result Value   CO2 18 (*)    Glucose, Bld 158 (*)    BUN 34 (*)    Creatinine, Ser 2.85 (*)    GFR calc non Af Amer 17 (*)    GFR calc Af Amer 19 (*)    All other components within normal limits  CBC WITH DIFFERENTIAL/PLATELET - Abnormal; Notable for the following:    WBC 12.4 (*)    Hemoglobin 11.7 (*)  RDW 16.7 (*)    Platelets 149 (*)    Neutro Abs 10.2 (*)    All other components within normal limits  TROPONIN I - Abnormal; Notable for the following:    Troponin I 0.08 (*)    All other components within normal limits  BRAIN NATRIURETIC PEPTIDE - Abnormal; Notable for the following:    B Natriuretic Peptide 496.3 (*)    All other components within normal limits  APTT - Abnormal; Notable for the following:    aPTT 20 (*)    All other components within normal limits  I-STAT CG4 LACTIC ACID, ED - Abnormal; Notable for the following:    Lactic Acid, Venous 2.68 (*)    All other components within normal limits  CULTURE, BLOOD (ROUTINE X 2)  CULTURE, BLOOD (ROUTINE X 2)  URINALYSIS, ROUTINE W REFLEX MICROSCOPIC  HEPARIN LEVEL (UNFRACTIONATED)    EKG  EKG Interpretation  Date/Time:  Friday Jan 16 2017 01:09:29 EDT Ventricular Rate:  115 PR Interval:    QRS Duration: 109 QT Interval:  334 QTC Calculation: 462 R Axis:   64 Text Interpretation:  Sinus tachycardia Low voltage, precordial leads Borderline T abnormalities, diffuse leads No acute changes No significant change since last tracing Confirmed by Rachael [name] ([id number]) on 01/16/2017 1:54:45 AM       Radiology Dg Chest Port 1 View  Result Date: 01/16/2017 CLINICAL DATA:  Hypoxia.  Sudden onset of  shortness of breath. EXAM: PORTABLE CHEST 1 VIEW COMPARISON:  Frontal and lateral views 11/26/2016 FINDINGS: The cardiomediastinal contours are normal. Small hiatal hernia. The lungs are clear. Pulmonary vasculature is normal. No consolidation, pleural effusion, or pneumothorax. No acute osseous abnormalities are seen. IMPRESSION: No acute abnormality. Electronically Signed   By: Jeb Levering M.D.   On: 01/16/2017 01:39    Procedures Procedures (including critical care time)  CRITICAL CARE Performed by: Rachael [name]   Total critical care time: 41 minutes for new onset acute hypoxic respiratory failure.  Critical care time was exclusive of separately billable procedures and treating other patients.  Critical care was necessary to treat or prevent imminent or life-threatening deterioration.  Critical care was time spent personally by me on the following activities: development of treatment plan with patient and/or surrogate as well as nursing, discussions with consultants, evaluation of patient's response to treatment, examination of patient, obtaining history from patient or surrogate, ordering and performing treatments and interventions, ordering and review of laboratory studies, ordering and review of radiographic studies, pulse oximetry and re-evaluation of patient's condition.   Medications Ordered in ED Medications  vancomycin (VANCOCIN) IVPB 1000 mg/200 mL premix (1,000 mg Intravenous New Bag/Given 01/16/17 0518)  vancomycin (VANCOCIN) IVPB 750 mg/150 ml premix (not administered)  levofloxacin (LEVAQUIN) IVPB 500 mg (not administered)  heparin bolus via infusion 4,500 Units (not administered)  heparin ADULT infusion 100 units/mL (25000 units/263mL sodium chloride 0.45%) (not administered)  albuterol (PROVENTIL) (2.5 MG/3ML) 0.083% nebulizer solution 5 mg (5 mg Nebulization Given 01/16/17 0146)  levofloxacin (LEVAQUIN) IVPB 750 mg (0 mg Intravenous Stopped 01/16/17 0520)      Initial Impression / Assessment and Plan / ED Course  I have reviewed the triage vital signs and the nursing notes.  Pertinent labs & imaging results that were available during my care of the patient were reviewed by me and considered in my medical decision making (see chart for details).  Clinical Course as of Jan 17 520  Fri Jan 16, 2017  0520 Pt weaned off  of non rebreathe to 5 liters of [place].  [AN]  0520 Will benefit from echo - CHF diagnosis is not established per patient B Natriuretic Peptide: (!) 496.3 [AN]  0520 Could be elevated due to the prednisone use. WBC: (!) 12.4 [AN]    Clinical Course User Index [AN] Rachael Biles, MD    Pt comes in with cc of DIB. Pt had acute hypoxic respiratory failure. Pt appears to have hx of CKD and she is on steroids. Pt has ?CHF and Afib. Pt reports exertional dyspnea over the last several months but today she had symptoms at rest. She has new orthopnea. Lungs show some rales.  CXR not very impressive for infection or fluid overload. PE therefore higher in the ddx.  PT has left shift on CBC. For now, as we dont have enough information, we will cover her with broad spectrum antibiotics, but I suspect that pt will need VQ scan. PE pretest probability very high at this time and pt is not a dimer candidate in my opinion. Lower extremity dopplers ordered. Heparin ordered. Admit.   Final Clinical Impressions(s) / ED Diagnoses   Final diagnoses:  Acute respiratory failure with hypoxia ([place])  Community acquired pneumonia, unspecified laterality    New Prescriptions New Prescriptions   No medications on file  I personally performed the services described in this documentation, which was scribed in my presence. The recorded information has been reviewed and is accurate.     Rachael Biles, MD 01/16/17 [phone]

## 2017-01-16 NOTE — H&P (Signed)
History and Physical    Rachael [name] YHC:[id number] DOB: August 25, 1956 DOA: 01/16/2017  Referring MD/NP/PA: Dr. Kathrynn Humble PCP: Ronita Hipps, MD  Patient coming from: Home via EMS   Chief Complaint: Shortness of breath  HPI: Rachael [name] is a 61 y.o. female with medical history significant of CHF last EF 60-65% in 10/2016, HTN, hypothyroidism, CVA w/o residual deficits; who presents with acute onset of shortness of breath. Patient was just admitted into the hospital with acute renal failure from 3/21-3/29. Upon review of records it appears the patient was to be discharged home on  Prednisone 60 mg, Lasix 40 mg , and sodium bicarbonate tablets and to follow-up with Dr. Hollie Salk of nephrology. However, patient notes that she is normally in prescriptions for Lasix or sodium bicarbonate tablets. When she followed up with Dr. Hollie Salk she was told there was no need for the Lasix. She has been on prednisone and then decreasing by 10 mg every 2 weeks currently on 30 mg. Patient noted since being home she has had shortness of breath with exertion. Walking even 15 feet makes her significantly tired and fatigued. At baseline patient is not on oxygen at home. In the last 2 nights at least patient notes that she has woken up with a nonproductive cough unable to catch her breath. Since being home she reports associated symptoms of sleeping on increased number of pillows, generalized whole body swelling(patient attributed symptoms to prednisone), and has had intermittent night sweats. Denies having any chest pain, vomiting, dysuria, flank pain, loss of consciousness, focal weakness, or change in vision.    ED Course: Once EMS arrived patient's O2 saturations were noted to be in the 80s on room air. To place on a nonrebreather with some relief of symptoms. Sepsis protocol was initiated given vital signs, WBC 12.4, and lactic acid 2.68. Patient was placed on empiric antibiotics of Levaquin and vancomycin as there was initially  concern for a respiratory etiology. Patient's BMP was elevated at 496.3 and troponin 0.08. EKG showed no significant signs of ischemia. Chest x-ray  was noted to be clear. Patient was subsequently placed on heparin drip for concern of possible pulmonary embolus. Urinalysis was not back at this time.  Review of Systems: As per HPI otherwise 10 point review of systems negative.   Past Medical History:  Diagnosis Date  . CHF (congestive heart failure) ([place])    Heart cath in 2006  . CKD (chronic kidney disease)    Baseline creat 1.4-1.7  . CVA (cerebral vascular accident) ([place])    No residual deficit  . HTN (hypertension)   . Hypothyroidism     Past Surgical History:  Procedure Laterality Date  . CARDIAC CATHETERIZATION  2006  . CHOLECYSTECTOMY    . IR GENERIC HISTORICAL  09/29/2016   IR FLUORO GUIDE CV LINE RIGHT 09/29/2016 Jacqulynn Cadet, MD [facility]  . IR GENERIC HISTORICAL  09/29/2016   IR US GUIDE VASC ACCESS RIGHT [facility]  . TEE WITHOUT CARDIOVERSION N/A 10/27/2016   Procedure: TRANSESOPHAGEAL ECHOCARDIOGRAM (TEE);  Surgeon: Dorothy Spark, MD;  [facility] ENDOSCOPY;  Service: Cardiovascular;  Laterality: N/A;     reports that she has never smoked. She has never used smokeless tobacco. She reports that she does not drink alcohol or use drugs.  Allergies  Allergen Reactions  . Proton Pump Inhibitors     Biopsy proven acute interstitial nephritis; required dialysis in 09/2016  . Protonix [Pantoprazole] Anaphylaxis  . Penicillins Rash and Other (See  Comments)    Tolerates rocephin  . Sulfa Antibiotics Rash    Family History  Problem Relation Age of Onset  . Hypertension Mother   . Stroke Mother   . Coronary artery disease Mother        Late onset  . Hypertension Father   . Coronary artery disease Father        Late onset  . Hypertension Sister   . Hypertension Brother     Prior to Admission medications   Medication Sig Start Date End Date Taking?  Authorizing Provider  citalopram (CELEXA) 20 MG tablet Take 1 tablet (20 mg total) by mouth daily. 12/04/16  Yes Velvet Bathe, MD  famotidine (PEPCID) 20 MG tablet Take 1 tablet (20 mg total) by mouth 2 (two) times daily. Patient taking differently: Take 20 mg by mouth 2 (two) times daily as needed for heartburn.  10/14/16  Yes Verlee Monte, MD  levothyroxine (SYNTHROID, LEVOTHROID) 88 MCG tablet Take 1 tablet (88 mcg total) by mouth daily before breakfast. 10/31/16  Yes Reyne Dumas, MD  metoCLOPramide (REGLAN) 5 MG tablet Take 1 tablet (5 mg total) by mouth 3 (three) times daily at 8am, 3pm and bedtime. Patient taking differently: Take 5 mg by mouth 3 (three) times daily as needed for nausea or vomiting.  12/04/16  Yes Velvet Bathe, MD  predniSONE (DELTASONE) 20 MG tablet Take 3 tablets (60 mg total) by mouth daily with breakfast. Patient taking differently: Take 30 mg by mouth daily with breakfast.  12/05/16  Yes Velvet Bathe, MD    Physical Exam:  Constitutional: Obese cushingoid appearing female who appears to be acutely ill. Vitals:   01/16/17 0106 01/16/17 0108 01/16/17 0115 01/16/17 0200  BP:  (!) 129/98 (!) 142/90 (!) 145/90  Pulse:  (!) 122 (!) 116 (!) 110  Resp:  18 (!) 22 18  Temp:  97.9 F (36.6 C)    TempSrc:  Oral    SpO2:  100% 100% 95%  Weight: 97.5 kg (215 lb)     Height: 5\' 2"  (1.575 m)      Eyes: PERRL, lids and conjunctivae normal ENMT: Mucous membranes are moist. Posterior pharynx clear of any exudate or lesions.  Neck: normal, supple, no masses, no thyromegaly Respiratory: Mildly tachypneic with crackles appreciated in the mid to lower lung fields. Patient able to talk in short sentences. Cardiovascular: Tachycardia, no murmurs / rubs / gallops. Bilateral lower extremity edema present legs appear equal in size. 2+ pedal pulses. No carotid bruits.  Abdomen: no tenderness, no masses palpated. No hepatosplenomegaly. Bowel sounds positive.  Musculoskeletal: no  clubbing / cyanosis. No joint deformity upper and lower extremities. Good ROM, no contractures. Normal muscle tone.  Skin: no rashes, lesions, ulcers. No induration Neurologic: CN 2-12 grossly intact. Sensation intact, DTR normal. Strength 5/5 in all 4.  Psychiatric: Normal judgment and insight. Alert and oriented x 3. Normal mood.     Labs on Admission: I have personally reviewed following labs and imaging studies  CBC:  Recent Labs Lab 01/16/17 0139  WBC 12.4*  NEUTROABS 10.2*  HGB 11.7*  HCT 36.2  MCV 91.6  PLT 353*   Basic Metabolic Panel:  Recent Labs Lab 01/16/17 0139  NA 139  K 3.8  CL 108  CO2 18*  GLUCOSE 158*  BUN 34*  CREATININE 2.85*  CALCIUM 9.3   GFR: Estimated Creatinine Clearance: 22.3 mL/min (A) (by C-G formula based on SCr of 2.85 mg/dL (H)). Liver Function Tests: No results for  input(s): AST, ALT, ALKPHOS, BILITOT, PROT, ALBUMIN in the last 168 hours. No results for input(s): LIPASE, AMYLASE in the last 168 hours. No results for input(s): AMMONIA in the last 168 hours. Coagulation Profile: No results for input(s): INR, PROTIME in the last 168 hours. Cardiac Enzymes:  Recent Labs Lab 01/16/17 0139  TROPONINI 0.08*   BNP (last 3 results) No results for input(s): PROBNP in the last 8760 hours. HbA1C: No results for input(s): HGBA1C in the last 72 hours. CBG: No results for input(s): GLUCAP in the last 168 hours. Lipid Profile: No results for input(s): CHOL, HDL, LDLCALC, TRIG, CHOLHDL, LDLDIRECT in the last 72 hours. Thyroid Function Tests: No results for input(s): TSH, T4TOTAL, FREET4, T3FREE, THYROIDAB in the last 72 hours. Anemia Panel: No results for input(s): VITAMINB12, FOLATE, FERRITIN, TIBC, IRON, RETICCTPCT in the last 72 hours. Urine analysis:    Component Value Date/Time   COLORURINE YELLOW 11/26/2016 2008   APPEARANCEUR CLOUDY (A) 11/26/2016 2008   LABSPEC 1.011 11/26/2016 2008   PHURINE 6.0 11/26/2016 2008   GLUCOSEU  >=500 (A) 11/26/2016 2008   HGBUR SMALL (A) 11/26/2016 2008   BILIRUBINUR NEGATIVE 11/26/2016 2008   KETONESUR 5 (A) 11/26/2016 2008   PROTEINUR 100 (A) 11/26/2016 2008   NITRITE NEGATIVE 11/26/2016 2008   LEUKOCYTESUR MODERATE (A) 11/26/2016 2008   Sepsis Labs: No results found for this or any previous visit (from the past 240 hour(s)).   Radiological Exams on Admission: Dg Chest Port 1 View  Result Date: 01/16/2017 CLINICAL DATA:  Hypoxia.  Sudden onset of shortness of breath. EXAM: PORTABLE CHEST 1 VIEW COMPARISON:  Frontal and lateral views 11/26/2016 FINDINGS: The cardiomediastinal contours are normal. Small hiatal hernia. The lungs are clear. Pulmonary vasculature is normal. No consolidation, pleural effusion, or pneumothorax. No acute osseous abnormalities are seen. IMPRESSION: No acute abnormality. Electronically Signed   By: Jeb Levering M.D.   On: 01/16/2017 01:39    EKG: Independently reviewed. Sinus tachycardia  Assessment/Plan Possible Sepsis: Acute. Patient presents with tachycardia and tachypnea in the setting of elevated WBC and lactic acid levels. Sepsis protocol was initiated blood cultures obtained. Unclear source of infection at this time. - Admit to telemetry bed - Sepsis protocol initiated - Follow-up blood, sputum, urine studies/cultures - Continue empiric antibiotics of vancomycin and Levaquin, and de-escalate when medically appropriate - Trend lactic acid levels  Acute respiratory failure with hypoxia: Question possibility of pulmonary embolus given patient's persistent tachycardia. Also on differential includes possibility of a CHF exacerbation. - Continuous pulse oximetry - continue heparin gtt for now - Duonebs prn SOB - Follow-up vascular doppler of lower extremities - May want to check VQ scan in a.m.   Suspect CHF exacerbation: Patient gives a history that sounds more so like CHF. Reports not going home on Lasix or sodium bicarbonate as it was  written in her discharge summary. Patient's initial BMP elevated at 496.3 and on physical exam generalized swelling noted. Last TEE showed LVEF 60-65% in 10/2016. - Strict I&O's - Trial of Lasix 20 mg IV - Reassess and determine if further diuresis is warranted - May to consult cardiology further assistance  Elevated troponin: Patient's initial troponin noted to be 0.08 on admission. Suspect that this could be secondary to supply demand setting of chronic kidney disease. - Trend cardiac troponins   Chronic kidney disease stage IV: Patient's overall kidney function appears improved as creatinine down from 4.9 to 2.85. Per review of records patient has Biopsy-proven AIN after PPI  use. She is followed by Dr. Hollie Salk - continue prednisone - Recheck BMP in a.m. - May want to consult Dr. Hollie Salk in a.m   Hypothyroidism: Last TSH within normal limits in 10/2016. - Check TSH  - Continue Synthroid  Depression - Continue citalopram   Anemia of chronic disease - Continue to monitor  H/O CVA without residual deficits   DVT prophylaxis: Heparin Code Status: Full Family Communication:  no family present at bedside  Disposition Plan:  discharge home once medically stable and respiratory status improved  Consults called: none  Admission status: Inpatient  Norval Morton MD [facility] Pager [phone]  If 7PM-7AM, please contact night-coverage [email or web address] Password [email or web address]  01/16/2017, 5:05 AM

## 2017-01-16 NOTE — Progress Notes (Signed)
Called positive results to [place], RN   Landry Mellow, RDMS, RVT 9:59 AM

## 2017-01-16 NOTE — ED Triage Notes (Signed)
Pt comes from home via [facility] EMS, c/o SOB, sudden onset, denies CP, upon EMS arrival 80% on RA, placed on non-rebreather. Stats came up to 98%

## 2017-01-16 NOTE — Progress Notes (Signed)
ANTICOAGULATION CONSULT NOTE - Initial Consult  Pharmacy Consult for Heparin  Indication: Rule out PE  Allergies  Allergen Reactions  . Proton Pump Inhibitors     Biopsy proven acute interstitial nephritis; required dialysis in 09/2016  . Protonix [Pantoprazole] Anaphylaxis  . Penicillins Rash and Other (See Comments)    Tolerates rocephin  . Sulfa Antibiotics Rash   Patient Measurements: Height: 5\' 2"  (157.5 cm) Weight: 215 lb (97.5 kg) IBW/kg (Calculated) : 50.1  Vital Signs: Temp: 97.9 F (36.6 C) (05/11 0108) Temp Source: Oral (05/11 0108) BP: 145/90 (05/11 0200) Pulse Rate: 110 (05/11 0200)  Labs:  Recent Labs  01/16/17 0139  HGB 11.7*  HCT 36.2  PLT 149*  APTT 20*  CREATININE 2.85*  TROPONINI 0.08*    Estimated Creatinine Clearance: 22.3 mL/min (A) (by C-G formula based on SCr of 2.85 mg/dL (H)).   Medical History: Past Medical History:  Diagnosis Date  . CHF (congestive heart failure) ([place])    Heart cath in 2006  . CKD (chronic kidney disease)    Baseline creat 1.4-1.7  . CVA (cerebral vascular accident) ([place])    No residual deficit  . HTN (hypertension)   . Hypothyroidism    Assessment: 61 y/o F who came in earlier tonight with sudden onset shortness of breath, CXR unremarkable, starting heparin per pharmacy for rule out PE, renal function prevents CT angio, Hgb 11.7, PTA meds reviewed.   Goal of Therapy:  Heparin level 0.3-0.7 units/ml Monitor platelets by anticoagulation protocol: Yes   Plan:  Heparin 4500 units BOLUS Start heparin drip at 1000 units/hr 1400 HL Daily CBC/HL Monitor for bleeding F/U PE work-up   Narda Bonds 01/16/2017,5:15 AM

## 2017-01-16 NOTE — Progress Notes (Signed)
01/16/2017 Dr Carolin Sicks is aware of result of  Vascular Ultrasound. Patient is  Positive for DVT.Rachael Sheehan RN

## 2017-01-16 NOTE — Progress Notes (Signed)
**  Preliminary report by tech**  IVC/Iliac vein duplex complete. Unable to visualize the mid and distal segments of the IVC due to overlying bowel gas. There is no evidence of deep vein thrombosis involving the proximal IVC, right and left common iliac, and external iliac veins.   01/16/17 4:58 PM Carlos Levering RVT

## 2017-01-16 NOTE — Progress Notes (Signed)
Pharmacy Antibiotic Note  Lulia Schriner Self is a 61 y.o. female admitted on 01/16/2017 with pneumonia.  Pharmacy has been consulted for Vancomycin/Levaquin dosing. WBC 12.4. Noted renal dysfunction. CXR clear.   Plan: Vancomycin 750 mg IV q24h Levaquin 500 mg IV q48h Trend WBC, temp, renal function  F/U infectious work-up Drug levels as indicated   Height: 5\' 2"  (157.5 cm) Weight: 215 lb (97.5 kg) IBW/kg (Calculated) : 50.1  Temp (24hrs), Avg:97.9 F (36.6 C), Min:97.9 F (36.6 C), Max:97.9 F (36.6 C)   Recent Labs Lab 01/16/17 0139  WBC 12.4*  CREATININE 2.85*    Estimated Creatinine Clearance: 22.3 mL/min (A) (by C-G formula based on SCr of 2.85 mg/dL (H)).    Allergies  Allergen Reactions  . Proton Pump Inhibitors     Biopsy proven acute interstitial nephritis; required dialysis in 09/2016  . Protonix [Pantoprazole] Anaphylaxis  . Penicillins Rash and Other (See Comments)    Tolerates rocephin  . Sulfa Antibiotics Rash    Narda Bonds 01/16/2017 3:11 AM

## 2017-01-16 NOTE — ED Notes (Signed)
Report called to [facility] 4E

## 2017-01-16 NOTE — Progress Notes (Signed)
CRITICAL VALUE ALERT  Critical value received:  Lactic Acid, Venous (2.9) Date of notification:  01/16/2017  Time of notification:  0908  Critical value read back:Yes.    Nurse who received alert:  Rico Sheehan RN  MD notified (1st page):  Dr Carolin Sicks  Time of first page:  Text MD Via Amion at [phone]  MD notified (2nd page):  Time of second page:  Responding MD:  Text MD Via Shea Evans  Time MD responded: Text MD Via Shea Evans

## 2017-01-16 NOTE — Progress Notes (Signed)
*  PRELIMINARY RESULTS* Vascular Ultrasound Lower extremity venous duplex has been completed.  Preliminary findings:  Extensive DVT noted bilaterally.  Bilateral common femoral, femoral, popliteal, gastroc, and soleal veins are thrombosed. Difficult to visualize iliac veins, but right external iliac appears partially thrombosed and the left external iliac appears patent. Could not visualize IVC due to bowel gas. If this is needed, please order VAS [place] IVC/ ILIAC and have patient be NPO for 6 hours.   Landry Mellow, RDMS, RVT  01/16/2017, 9:53 AM

## 2017-01-16 NOTE — Progress Notes (Signed)
PROGRESS NOTE    Rachael [name]  DXI:[id number] DOB: [birth year] DOA: 01/16/2017 PCP: Ronita Hipps, MD   Brief Narrative: 61 y.o. female with medical history significant of CHF last EF 60-65% in 10/2016, HTN, hypothyroidism, CVA w/o residual deficits; who presents with acute onset of shortness of breath. Patient was just admitted into the hospital with acute renal failure from 3/21-3/29. In the ER patient was found to have hypoxia, possible sepsis. Chest x-ray clear. Also started on IV heparin for possible PE.  Assessment & Plan:   #Possible sepsis of unknown etiology: Trend lactic acid level, WBC count. Follow up culture results. Continue IV vancomycin and Levaquin for now. Blood pressure acceptable.  #Acute respiratory failure with hypoxia likely due to presumed PE: CT scan chest with contrast was not able to don because of renal failure. Bilateral lower extremity Doppler consistent with extensive DVT bilateral. Currently on IV heparin. Vascular surgery consult requested and discussed with Dr. Donzetta Matters. I will check pulmonary disease can. Continue bronchodilators. Added and supportive care and options and treatment.  #Presumed CHF on admission which is less likely. Patient is euvolemic. Continue to monitor. Mild elevation in troponin likely in the setting of renal failure and possible PE. -Patient had TEE on 10/27/2016 showed EF of 60-65% with normal wall motion.  #Likely chronic kidney disease is stage IV: On reviewing patient's records outpatient serum creatinine level is slowly trending down. Patient had a kidney biopsy proven AIN after PPI use. Currently on prednisone tapering dose.  continue to monitor serum creatinine level. -Start oral sodium bicarbonate.  #Hypothyroidism: Continue Synthroid.  Continue current medical and supportive care.  Principal Problem:   Sepsis ([place]) Active Problems:   Hypothyroidism   History of CVA (cerebrovascular accident)   Depression   CKD (chronic  kidney disease), stage IV (HCC)     Elevated troponin  DVT prophylaxis: IV heparin Code Status: Full code Family Communication: No family at bedside Disposition Plan: Likely discharge home in 1-2 days    Consultants:   Vascular surgery  Procedures: Vascular Doppler Antimicrobials: Vancomycin and Levaquin since 5/11  Subjective: Patient was seen and examined at bedside. Reported feeling better today. Denied headache, dizziness, chest pain, cough, nausea or vomiting. Shortness of breath better.  Objective: Vitals:   01/16/17 0314 01/16/17 0547 01/16/17 0655 01/16/17 0748  BP: (!) 129/59 102/70 122/80 (!) 103/52  Pulse: (!) 110 (!) 102 (!) 106 (!) 110  Resp: 18 18 17 16   Temp:   98 F (36.7 C) 98 F (36.7 C)  TempSrc:   Oral Oral  SpO2: 93% 97% 93% 93%  Weight:   116.2 kg (256 lb 2.8 oz)   Height:   5\' 2"  (1.575 m)     Intake/Output Summary (Last 24 hours) at 01/16/17 1104 Last data filed at 01/16/17 0749  Gross per 24 hour  Intake                0 ml  Output              250 ml  Net             -250 ml   Filed Weights   01/16/17 0106 01/16/17 0655  Weight: 97.5 kg (215 lb) 116.2 kg (256 lb 2.8 oz)    Examination:  General exam: Appears calm and comfortable  Respiratory system: Clear to auscultation. Respiratory effort normal. No wheezing or crackle Cardiovascular system: S1 & S2 heard, RRR.  No pedal edema. Gastrointestinal system: Abdomen  is nondistended, soft and nontender. Normal bowel sounds heard. Central nervous system: Alert and oriented. No focal neurological deficits. Extremities: Symmetric 5 x 5 power. Skin: No rashes, lesions or ulcers Psychiatry: Judgement and insight appear normal. Mood & affect appropriate.     Data Reviewed: I have personally reviewed following labs and imaging studies  CBC:  Recent Labs Lab 01/16/17 0139  WBC 12.4*  NEUTROABS 10.2*  HGB 11.7*  HCT 36.2  MCV 91.6  PLT 818*   Basic Metabolic Panel:  Recent  Labs Lab 01/16/17 0139  NA 139  K 3.8  CL 108  CO2 18*  GLUCOSE 158*  BUN 34*  CREATININE 2.85*  CALCIUM 9.3   GFR: Estimated Creatinine Clearance: 24.7 mL/min (A) (by C-G formula based on SCr of 2.85 mg/dL (H)). Liver Function Tests: No results for input(s): AST, ALT, ALKPHOS, BILITOT, PROT, ALBUMIN in the last 168 hours. No results for input(s): LIPASE, AMYLASE in the last 168 hours. No results for input(s): AMMONIA in the last 168 hours. Coagulation Profile: No results for input(s): INR, PROTIME in the last 168 hours. Cardiac Enzymes:  Recent Labs Lab 01/16/17 0139 01/16/17 0555  TROPONINI 0.08* 0.08*   BNP (last 3 results) No results for input(s): PROBNP in the last 8760 hours. HbA1C: No results for input(s): HGBA1C in the last 72 hours. CBG: No results for input(s): GLUCAP in the last 168 hours. Lipid Profile: No results for input(s): CHOL, HDL, LDLCALC, TRIG, CHOLHDL, LDLDIRECT in the last 72 hours. Thyroid Function Tests:  Recent Labs  01/16/17 0555  TSH 4.120   Anemia Panel: No results for input(s): VITAMINB12, FOLATE, FERRITIN, TIBC, IRON, RETICCTPCT in the last 72 hours. Sepsis Labs:  Recent Labs Lab 01/16/17 0328 01/16/17 0555 01/16/17 0747  PROCALCITON  --  <0.10  --   LATICACIDVEN 2.68* 3.4* 2.9*    Recent Results (from the past 240 hour(s))  MRSA PCR Screening     Status: None   Collection Time: 01/16/17  7:04 AM  Result Value Ref Range Status   MRSA by PCR NEGATIVE NEGATIVE Final    Comment:        The GeneXpert MRSA Assay (FDA approved for NASAL specimens only), is one component of a comprehensive MRSA colonization surveillance program. It is not intended to diagnose MRSA infection nor to guide or monitor treatment for MRSA infections.          Radiology Studies: Dg Chest Port 1 View  Result Date: 01/16/2017 CLINICAL DATA:  Hypoxia.  Sudden onset of shortness of breath. EXAM: PORTABLE CHEST 1 VIEW COMPARISON:  Frontal  and lateral views 11/26/2016 FINDINGS: The cardiomediastinal contours are normal. Small hiatal hernia. The lungs are clear. Pulmonary vasculature is normal. No consolidation, pleural effusion, or pneumothorax. No acute osseous abnormalities are seen. IMPRESSION: No acute abnormality. Electronically Signed   By: Jeb Levering M.D.   On: 01/16/2017 01:39        Scheduled Meds: . citalopram  20 mg Oral Daily  . levothyroxine  88 mcg Oral QAC breakfast  . predniSONE  30 mg Oral Q breakfast  . sodium chloride flush  3 mL Intravenous Q12H   Continuous Infusions: . sodium chloride    . heparin 1,000 Units/hr (01/16/17 0629)  . [START ON 01/18/2017] levofloxacin (LEVAQUIN) IV    . [START ON 01/17/2017] vancomycin       LOS: 0 days    Adrienne Delay Tanna Furry, MD [facility] Pager [phone]  If 7PM-7AM, please contact night-coverage [email or web address] Password  [email or web address] 01/16/2017, 11:04 AM

## 2017-01-16 NOTE — Consult Note (Signed)
Vascular and Vein Specialist of [facility]  Patient name: Rachael [name] MRN: [id number] DOB: 01/09/1956 Sex: female  REASON FOR CONSULT: bilateral DVT, consult is from Dr. Carolin Sicks  HPI: Rachael [name] is a 61 y.o. female, who presents for evaluation of extensive bilateral lower extremity DVT. She presented to the [facility] ED today with increasing shortness of breath at rest. The patient has a history of CKD that has worsened since January 2018 requiring temporary dialysis.  The patient has no prior history of DVT. She denies any pain in her legs. She has leg swelling at baseline. She denies any recent surgeries or travel. She has been less active since January due to her health issues. She has noticed approximately 20 lbs of unintended weight loss since January. She also reports some night sweats in the past couple night. She has never been a smoker. She is not on home oxygen. No history of cerebral hemorrhage or GI bleed.   Her past medical history also includes TIA, hypertension and CHF. No history of CAD.  She is not diabetic. No known history of malignancy. On ROS, she denies fever, but reports recent night sweats. Denies chest pain, nausea, vomiting or diarrhea. She does admit to poor appetite at this time. Full ROS below.   Past Medical History:  Diagnosis Date  . CHF (congestive heart failure) ([place])    Heart cath in 2006  . CKD (chronic kidney disease)    Baseline creat 1.4-1.7  . CVA (cerebral vascular accident) ([place])    No residual deficit  . Dyspnea   . HTN (hypertension)   . Hypothyroidism     Family History  Problem Relation Age of Onset  . Hypertension Mother   . Stroke Mother   . Coronary artery disease Mother        Late onset  . Hypertension Father   . Coronary artery disease Father        Late onset  . Hypertension Sister   . Hypertension Brother     SOCIAL HISTORY: Social History   Social History  . Marital status: Single    Spouse name: N/A  . Number of  children: N/A  . Years of education: N/A   Occupational History  . Not on file.   Social History Main Topics  . Smoking status: Never Smoker  . Smokeless tobacco: Never Used  . Alcohol use No  . Drug use: No  . Sexual activity: Not on file   Other Topics Concern  . Not on file   Social History Narrative  . No narrative on file    Allergies  Allergen Reactions  . Proton Pump Inhibitors     Biopsy proven acute interstitial nephritis; required dialysis in 09/2016  . Protonix [Pantoprazole] Anaphylaxis  . Penicillins Rash and Other (See Comments)    Tolerates rocephin  . Sulfa Antibiotics Rash    MEDICATIONS:  Current Facility-Administered Medications  Medication Dose Route Frequency Provider Last Rate Last Dose  . 0.9 %  sodium chloride infusion  250 mL Intravenous PRN Fuller Plan A, MD      . acetaminophen (TYLENOL) tablet 650 mg  650 mg Oral Q4H PRN Smith, Rondell A, MD      . citalopram (CELEXA) tablet 20 mg  20 mg Oral Daily Tamala Julian, Rondell A, MD   20 mg at 01/16/17 0912  . famotidine (PEPCID) tablet 20 mg  20 mg Oral BID PRN Norval Morton, MD      .  heparin ADULT infusion 100 units/mL (25000 units/263mL sodium chloride 0.45%)  1,000 Units/hr Intravenous Continuous Erenest Blank, RPH 10 mL/hr at 01/16/17 0629 1,000 Units/hr at 01/16/17 0629  . ipratropium-albuterol (DUONEB) 0.5-2.5 (3) MG/3ML nebulizer solution 3 mL  3 mL Nebulization Q4H PRN Fuller Plan A, MD      . Derrill Memo ON 01/18/2017] levofloxacin (LEVAQUIN) IVPB 500 mg  500 mg Intravenous Q48H Ledford, James L, RPH      . levothyroxine (SYNTHROID, LEVOTHROID) tablet 88 mcg  88 mcg Oral QAC breakfast Fuller Plan A, MD   88 mcg at 01/16/17 0912  . metoCLOPramide (REGLAN) tablet 5 mg  5 mg Oral TID PRN Fuller Plan A, MD      . ondansetron (ZOFRAN) injection 4 mg  4 mg Intravenous Q6H PRN Smith, Rondell A, MD      . predniSONE (DELTASONE) tablet 30 mg  30 mg Oral Q breakfast Fuller Plan A, MD   30 mg at  01/16/17 0913  . sodium bicarbonate tablet 650 mg  650 mg Oral BID Rosita Fire, MD      . sodium chloride flush (NS) 0.9 % injection 3 mL  3 mL Intravenous Q12H Smith, Rondell A, MD      . sodium chloride flush (NS) 0.9 % injection 3 mL  3 mL Intravenous PRN Norval Morton, MD      . Derrill Memo ON 01/17/2017] vancomycin (VANCOCIN) IVPB 750 mg/150 ml premix  750 mg Intravenous Q24H Erenest Blank, RPH        REVIEW OF SYSTEMS:  [X]  denotes positive finding, [ ]  denotes negative finding Cardiac  Comments:  Chest pain or chest pressure:    Shortness of breath upon exertion: x   Short of breath when lying flat: x   Irregular heart rhythm:        Vascular    Pain in calf, thigh, or hip brought on by ambulation:    Pain in feet at night that wakes you up from your sleep:     Blood clot in your veins:    Leg swelling:  x       Pulmonary    Oxygen at home:    Productive cough:     Wheezing:         Neurologic    Sudden weakness in arms or legs:     Sudden numbness in arms or legs:     Sudden onset of difficulty speaking or slurred speech:    Temporary loss of vision in one eye:     Problems with dizziness:         Gastrointestinal    Blood in stool:     Vomited blood:         Genitourinary    Burning when urinating:     Blood in urine:        Psychiatric    Major depression:         Hematologic    Bleeding problems:    Problems with blood clotting too easily:        Skin    Rashes or ulcers:        Constitutional    Fever or chills:      PHYSICAL EXAM: Vitals:   01/16/17 0547 01/16/17 0655 01/16/17 0748 01/16/17 1100  BP: 102/70 122/80 (!) 103/52 119/76  Pulse: (!) 102 (!) 106 (!) 110 100  Resp: 18 17 16 18   Temp:  98 F (36.7 C) 98 F (36.7 C)  97.7 F (36.5 C)  TempSrc:  Oral Oral Oral  SpO2: 97% 93% 93% 92%  Weight:  256 lb 2.8 oz (116.2 kg)    Height:  5\' 2"  (1.575 m)      GENERAL: The patient is a well-nourished female, alert and oriented in  no acute distress. The vital signs are documented above. HEENT: normocephalic, atraumatic, no abnormalities noted.  CARDIAC: There is a regular rhythm. Sinus tachy.  VASCULAR: 2+ radial pulses. 2+ dorsalis pedis pulses. Feet warm and pink. Legs and feet without ischemic changes. Left leg is very slightly larger than right with bilateral lower extremity edema.  PULMONARY: on 4L of 02 [place], normal respiratory effort. Clear to auscultation ABDOMEN: Soft and non-tender with normal pitched bowel sounds.  MUSCULOSKELETAL: There are no major deformities or cyanosis. NEUROLOGIC: No focal deficits noted.  SKIN: There are no ulcers or rashes noted. PSYCHIATRIC: The patient has a normal affect.  DATA:  Venous duplex 01/16/17  Bilateral common femoral, femoral, popliteal, gastroc, and soleal veins are thrombosed.  MEDICAL ISSUES: Bilateral lower extremity DVT Probable PE CKD  Unable to obtain CT chest to evaluate for PE secondary to renal insufficiency. Will obtain IVC/iliac venous duplex to evaluate extent of DVT. Lower extremities are well perfused with no evidence of phlegmasia. Continue on heparin. Currently hemodynamically stable. No indication for urgent venous intervention. Dr. Donzetta Matters to see this afternoon.   Virgina Jock, PA-C [facility] [place] [phone]    I have independently interviewed patient and agree with PA assessment and plan above. She does not have evidence of central venous dvt and does have high probability of pe by vq scan. Swelling in legs is chronic and not greater than baseline. When she is stable from pe and fully anticoagulated she is ok for ambulation and should have compression stockings as outpatient. She has high risk of post thrombotic syndrome in the future but already has signficant leg edema. No vascular intervention planned at this time.   Alvon Nygaard C. Donzetta Matters, MD [facility] [place] Office: [phone] Pager:  [phone]

## 2017-01-16 NOTE — Progress Notes (Signed)
CRITICAL VALUE ALERT  Critical value received:  Lactic Acid, Venous (2.2)  Date of notification:  01/16/2017  Time of notification:  6728  Critical value read back:Yes.    Nurse who received alert:  Rico Sheehan RN  MD notified (1st page):  Dr Carolin Sicks  Time of first page:  84  MD notified (2nd page):  Time of second page:  Responding MD:  Dr Carolin Sicks  Time MD responded:  1315 Per MD  Do not have to repeat will order lactic Acid in the AM.

## 2017-01-17 DIAGNOSIS — I2699 Other pulmonary embolism without acute cor pulmonale: Secondary | ICD-10-CM | POA: Diagnosis present

## 2017-01-17 DIAGNOSIS — I82403 Acute embolism and thrombosis of unspecified deep veins of lower extremity, bilateral: Secondary | ICD-10-CM | POA: Diagnosis present

## 2017-01-17 DIAGNOSIS — J9601 Acute respiratory failure with hypoxia: Secondary | ICD-10-CM | POA: Diagnosis present

## 2017-01-17 LAB — BLOOD CULTURE ID PANEL (REFLEXED)
Acinetobacter baumannii: NOT DETECTED
CANDIDA KRUSEI: NOT DETECTED
CANDIDA PARAPSILOSIS: NOT DETECTED
Candida albicans: NOT DETECTED
Candida glabrata: NOT DETECTED
Candida tropicalis: NOT DETECTED
ENTEROCOCCUS SPECIES: NOT DETECTED
ESCHERICHIA COLI: NOT DETECTED
Enterobacter cloacae complex: NOT DETECTED
Enterobacteriaceae species: NOT DETECTED
HAEMOPHILUS INFLUENZAE: NOT DETECTED
KLEBSIELLA OXYTOCA: NOT DETECTED
Klebsiella pneumoniae: NOT DETECTED
Listeria monocytogenes: NOT DETECTED
Methicillin resistance: NOT DETECTED
Neisseria meningitidis: NOT DETECTED
PROTEUS SPECIES: NOT DETECTED
Pseudomonas aeruginosa: NOT DETECTED
SERRATIA MARCESCENS: NOT DETECTED
STAPHYLOCOCCUS AUREUS BCID: NOT DETECTED
STAPHYLOCOCCUS SPECIES: DETECTED — AB
Streptococcus agalactiae: NOT DETECTED
Streptococcus pneumoniae: NOT DETECTED
Streptococcus pyogenes: NOT DETECTED
Streptococcus species: NOT DETECTED

## 2017-01-17 LAB — HEPARIN LEVEL (UNFRACTIONATED)
Heparin Unfractionated: 0.61 IU/mL (ref 0.30–0.70)
Heparin Unfractionated: 0.46 IU/mL (ref 0.30–0.70)

## 2017-01-17 LAB — CBC
HCT: 30.8 % — ABNORMAL LOW (ref 36.0–46.0)
Hemoglobin: 10.3 g/dL — ABNORMAL LOW (ref 12.0–15.0)
MCH: 30.5 pg (ref 26.0–34.0)
MCHC: 33.4 g/dL (ref 30.0–36.0)
MCV: 91.1 fL (ref 78.0–100.0)
PLATELETS: 109 10*3/uL — AB (ref 150–400)
RBC: 3.38 MIL/uL — ABNORMAL LOW (ref 3.87–5.11)
RDW: 16.8 % — AB (ref 11.5–15.5)
WBC: 9.5 10*3/uL (ref 4.0–10.5)

## 2017-01-17 LAB — BASIC METABOLIC PANEL
ANION GAP: 11 (ref 5–15)
BUN: 32 mg/dL — ABNORMAL HIGH (ref 6–20)
CHLORIDE: 106 mmol/L (ref 101–111)
CO2: 24 mmol/L (ref 22–32)
Calcium: 8.9 mg/dL (ref 8.9–10.3)
Creatinine, Ser: 2.67 mg/dL — ABNORMAL HIGH (ref 0.44–1.00)
GFR calc non Af Amer: 18 mL/min — ABNORMAL LOW (ref >60)
GFR, EST AFRICAN AMERICAN: 21 mL/min — AB (ref >60)
Glucose, Bld: 101 mg/dL — ABNORMAL HIGH (ref 65–99)
POTASSIUM: 3.4 mmol/L — AB (ref 3.5–5.1)
SODIUM: 141 mmol/L (ref 135–145)

## 2017-01-17 LAB — LACTIC ACID, PLASMA: LACTIC ACID, VENOUS: 1.1 mmol/L (ref 0.5–1.9)

## 2017-01-17 MED ORDER — WARFARIN SODIUM 5 MG PO TABS
7.5000 mg | ORAL_TABLET | Freq: Once | ORAL | Status: AC
  Administered 2017-01-17: 7.5 mg via ORAL
  Filled 2017-01-17: qty 2

## 2017-01-17 MED ORDER — ORAL CARE MOUTH RINSE
15.0000 mL | Freq: Two times a day (BID) | OROMUCOSAL | Status: DC
  Administered 2017-01-17 – 2017-01-25 (×16): 15 mL via OROMUCOSAL

## 2017-01-17 MED ORDER — HEPARIN (PORCINE) IN NACL 100-0.45 UNIT/ML-% IJ SOLN
650.0000 [IU]/h | INTRAMUSCULAR | Status: AC
  Administered 2017-01-17 – 2017-01-18 (×3): 600 [IU]/h via INTRAVENOUS
  Administered 2017-01-20: 650 [IU]/h via INTRAVENOUS
  Filled 2017-01-17 (×3): qty 250

## 2017-01-17 MED ORDER — WARFARIN - PHARMACIST DOSING INPATIENT
Freq: Every day | Status: DC
  Administered 2017-01-17 – 2017-01-19 (×3)
  Administered 2017-01-21: 1
  Administered 2017-01-22: 18:00:00
  Administered 2017-01-23: 1.5

## 2017-01-17 NOTE — Progress Notes (Addendum)
ANTICOAGULATION CONSULT NOTE - Follow Up Consult  Pharmacy Consult for Heparin  Indication: Extensive bilateral DVTs, PE   Allergies  Allergen Reactions  . Proton Pump Inhibitors     Biopsy proven acute interstitial nephritis; required dialysis in 09/2016  . Protonix [Pantoprazole] Anaphylaxis  . Penicillins Rash and Other (See Comments)    Tolerates rocephin  . Sulfa Antibiotics Rash   Patient Measurements: Height: 5\' 2"  (157.5 cm) Weight: 252 lb 10.4 oz (114.6 kg) IBW/kg (Calculated) : 50.1  Vital Signs: Temp: 98.4 F (36.9 C) (05/12 0830) Temp Source: Oral (05/12 0830) BP: 85/41 (05/12 0830) Pulse Rate: 109 (05/12 0830)  Labs:  Recent Labs  01/16/17 0139 01/16/17 0555 01/16/17 1401 01/16/17 2254 01/17/17 0211 01/17/17 0844  HGB 11.7*  --   --   --  10.3*  --   HCT 36.2  --   --   --  30.8*  --   PLT 149*  --   --   --  109*  --   APTT 20*  --   --   --   --   --   HEPARINUNFRC  --   --  1.64* 1.09*  --  0.61  CREATININE 2.85*  --   --   --  2.67*  --   TROPONINI 0.08* 0.08*  --   --   --   --     Estimated Creatinine Clearance: 26.2 mL/min (A) (by C-G formula based on SCr of 2.67 mg/dL (H)).  Assessment: On heparin drip for extensive bilateral DVTs and high probability for PE per VQ scan. Heparin within range. H/H stable, pltc dropped from 149>109. No bleeding per nurse. Will recheck level.  Goal of Therapy:  Heparin level 0.3-0.7 units/ml Monitor platelets by anticoagulation protocol: Yes   Plan:  -Continue heparin drip at 600 units/hr -1700 HL -Daily HL and CBC - Monitor pltc and s/sx of bleeding  Arminger, Broadus John 01/17/2017,10:07 AM  Addendum: Pharmacy consulted to transition to warfarin for bilateral DVT and high probability PE per VQ scan. The patient has never been on [facility] per notes and has no liver dysfunction. She did have a CVA in 2015 without residual deficits. Will need a minimum of 5 days of heparin bridge until the INR>2.0 x2  days  Plan Continue heparin to 600 units/hr Warfarin 7.5mg  POx1 tonight 1700 HL Daily CBC/INR Monitor for bleeding  Dierdre Harness, BS, PharmD Clinical Pharmacy Resident [phone] (Pager) 01/17/2017 1:33 PM

## 2017-01-17 NOTE — Progress Notes (Signed)
ANTICOAGULATION CONSULT NOTE - Follow Up Consult  Pharmacy Consult for Heparin  Indication: Extensive bilateral DVTs, PE   Allergies  Allergen Reactions  . Proton Pump Inhibitors     Biopsy proven acute interstitial nephritis; required dialysis in 09/2016  . Protonix [Pantoprazole] Anaphylaxis  . Penicillins Rash and Other (See Comments)    Tolerates rocephin  . Sulfa Antibiotics Rash   Patient Measurements: Height: 5\' 2"  (157.5 cm) Weight: 252 lb 10.4 oz (114.6 kg) IBW/kg (Calculated) : 50.1  Vital Signs: Temp: 98.4 F (36.9 C) (05/12 1614) Temp Source: Oral (05/12 1614) BP: 126/80 (05/12 1215) Pulse Rate: 104 (05/12 1215)  Labs:  Recent Labs  01/16/17 0139 01/16/17 0555  01/16/17 2254 01/17/17 0211 01/17/17 0844 01/17/17 1657  HGB 11.7*  --   --   --  10.3*  --   --   HCT 36.2  --   --   --  30.8*  --   --   PLT 149*  --   --   --  109*  --   --   APTT 20*  --   --   --   --   --   --   HEPARINUNFRC  --   --   < > 1.09*  --  0.61 0.46  CREATININE 2.85*  --   --   --  2.67*  --   --   TROPONINI 0.08* 0.08*  --   --   --   --   --   < > = values in this interval not displayed.  Estimated Creatinine Clearance: 26.2 mL/min (A) (by C-G formula based on SCr of 2.67 mg/dL (H)).  Assessment: On heparin drip for extensive bilateral DVTs and high probability for PE per VQ scan- vascular recommended to continue anticoagulation and not place an IVC filter.  Today is D#1 of minimum of 5 days of overlap with parenteral anticoagulation and warfarin- INR needs to be >2 for 24h before parenteral agent can be stopped. Heparin level is within range this evening on 600 units/hr.   No bleeding noted.  Goal of Therapy:  INR 2-3 Heparin level 0.3-0.7 units/ml Monitor platelets by anticoagulation protocol: Yes   Plan:  -Continue heparin drip at 600 units/hr -Warfarin 7.5mg  po x1 tonight as previously ordered -Daily heparin level, INR, and CBC -Follow for s/s  bleeding -Education prior to discharge  Graycee Greeson D. Real Cona, PharmD, BCPS Clinical Pharmacist Pager: [phone]  01/17/2017 5:52 PM

## 2017-01-17 NOTE — Progress Notes (Signed)
  Progress Note    01/17/2017 11:39 AM * No surgery found *  Subjective:  Breathing better, no leg pain  Vitals:   01/17/17 0400 01/17/17 0830  BP: 138/85 (!) 85/41  Pulse: (!) 105 (!) 109  Resp: 13 20  Temp: 98 F (36.7 C) 98.4 F (36.9 C)    Physical Exam: aaox3  on O2 by nasal cannula Legs have bilateral trace non-pitting edema  CBC    Component Value Date/Time   WBC 9.5 01/17/2017 0211   RBC 3.38 (L) 01/17/2017 0211   HGB 10.3 (L) 01/17/2017 0211   HCT 30.8 (L) 01/17/2017 0211   PLT 109 (L) 01/17/2017 0211   MCV 91.1 01/17/2017 0211   MCH 30.5 01/17/2017 0211   MCHC 33.4 01/17/2017 0211   RDW 16.8 (H) 01/17/2017 0211   LYMPHSABS 1.1 01/16/2017 0139   MONOABS 1.0 01/16/2017 0139   EOSABS 0.0 01/16/2017 0139   BASOSABS 0.1 01/16/2017 0139    BMET    Component Value Date/Time   NA 141 01/17/2017 0211   K 3.4 (L) 01/17/2017 0211   CL 106 01/17/2017 0211   CO2 24 01/17/2017 0211   GLUCOSE 101 (H) 01/17/2017 0211   BUN 32 (H) 01/17/2017 0211   CREATININE 2.67 (H) 01/17/2017 0211   CALCIUM 8.9 01/17/2017 0211   CALCIUM 8.7 10/07/2016 1418   GFRNONAA 18 (L) 01/17/2017 0211   GFRAA 21 (L) 01/17/2017 0211    INR    Component Value Date/Time   INR 1.07 10/23/2016 2055     Intake/Output Summary (Last 24 hours) at 01/17/17 1139 Last data filed at 01/17/17 0911  Gross per 24 hour  Intake               71 ml  Output              550 ml  Net             -479 ml     Assessment:  61 y.o. female is here with extensive bilateral lower extremity dvt's that are asymptomatic and pe.  Plan: No vascular intervention at this time. When fully anticoagulated and when safe from pe standpoint she is ok for ambulation. Ted hose now and will need compression stockings as outpatient. Can f/u with vascular prn.    Chloe Miyoshi C. Donzetta Matters, MD [facility] Office: [phone] Pager: [phone]  01/17/2017 11:39 AM

## 2017-01-17 NOTE — Progress Notes (Signed)
PROGRESS NOTE    Rachael [name]  YQI:[id number] DOB: April 08, 1956 DOA: 01/16/2017 PCP: Ronita Hipps, MD   Brief Narrative: 61 y.o. female with medical history significant of CHF last EF 60-65% in 10/2016, HTN, hypothyroidism, CVA w/o residual deficits; who presents with acute onset of shortness of breath. Patient was just admitted into the hospital with acute renal failure from 3/21-3/29. In the ER patient was found to have hypoxia, possible sepsis. Chest x-ray clear. Also started on IV heparin for possible PE.  Assessment & Plan:   #Possible sepsis of unknown etiology: Blood culture 1 bottle growing coag-negative staph likely contaminant. For now continue broad spectrum antibiotics. Sepsis parameters improving. Follow up final culture result.  #Acute respiratory failure with hypoxia likely due to PE: CT scan chest with contrast was not able to don because of renal failure. Bilateral lower extremity Doppler consistent with extensive DVT bilateral. Currently on IV heparin. -Vascular consult appreciated. The V/Q scan consistent with hypermobility for PE. I discussed with Dr. Donzetta Matters from vascular surgery today. He recommended to continue anticoagulation for now and no need for IVC filter. -Requiring about 4-5 L of oxygen. Continue to monitor supportive care and bronchodilators. -Ordered echocardiogram -Planned to start oral Coumadin. I discussed with the patient regarding benefit and risk of oral anticoagulation and she agreed with continuation. I'll consult pharmacy for that.  #Presumed CHF on admission which is less likely. Patient is euvolemic. Continue to monitor. Mild elevation in troponin likely in the setting of renal failure and possible PE. -Patient had TEE on 10/27/2016 showed EF of 60-65% with normal wall motion.  #Likely chronic kidney disease is stage IV: On reviewing patient's records outpatient serum creatinine level is slowly trending down. Patient had a kidney biopsy proven AIN after  PPI use. Currently on prednisone tapering dose.  continue to monitor serum creatinine level. Creatinine level is stable today. -Start oral sodium bicarbonate.  #Hypothyroidism: Continue Synthroid.  Continue current medical and supportive care.  Principal Problem:   Sepsis ([place]) Active Problems:   Hypothyroidism   History of CVA (cerebrovascular accident)   Depression   CKD (chronic kidney disease), stage IV (HCC)     Elevated troponin  DVT prophylaxis: IV heparin Code Status: Full code Family Communication: No family at bedside Disposition Plan: Likely discharge home in 1-2 days    Consultants:   Vascular surgery  Procedures: Vascular Doppler Antimicrobials: Vancomycin and Levaquin since 5/11  Subjective: Patient was seen and examined at bedside. Reported feeling better. Denied headache, dizziness, nausea, vomiting, chest pain, shortness of breath, leg pain or tenderness.  Objective: Vitals:   01/17/17 0400 01/17/17 0500 01/17/17 0830 01/17/17 1215  BP: 138/85  (!) 85/41 126/80  Pulse: (!) 105  (!) 109 (!) 104  Resp: 13  20 19   Temp: 98 F (36.7 C)  98.4 F (36.9 C) 98 F (36.7 C)  TempSrc: Oral  Oral Oral  SpO2: 90%  90% (!) 87%  Weight:  114.6 kg (252 lb 10.4 oz)    Height:        Intake/Output Summary (Last 24 hours) at 01/17/17 1250 Last data filed at 01/17/17 0911  Gross per 24 hour  Intake               71 ml  Output              550 ml  Net             -479 ml   [employer]  01/16/17 0106 01/16/17 0655 01/17/17 0500  Weight: 97.5 kg (215 lb) 116.2 kg (256 lb 2.8 oz) 114.6 kg (252 lb 10.4 oz)    Examination:  General exam: Not in distress Respiratory system: Clear bilateral. Respiratory effort normal. No wheezing or crackle Cardiovascular system: Regular rate rhythm, S1-2 normal. Nonpitting lower extremity edema. Gastrointestinal system: Abdomen is nondistended, soft and nontender. Normal bowel sounds heard. Central nervous system: Alert and  oriented. No focal neurological deficits. Extremities: Symmetric 5 x 5 power. Skin: No rashes, lesions or ulcers Psychiatry: Judgement and insight appear normal. Mood & affect appropriate.     Data Reviewed: I have personally reviewed following labs and imaging studies  CBC:  Recent Labs Lab 01/16/17 0139 01/17/17 0211  WBC 12.4* 9.5  NEUTROABS 10.2*  --   HGB 11.7* 10.3*  HCT 36.2 30.8*  MCV 91.6 91.1  PLT 149* 562*   Basic Metabolic Panel:  Recent Labs Lab 01/16/17 0139 01/17/17 0211  NA 139 141  K 3.8 3.4*  CL 108 106  CO2 18* 24  GLUCOSE 158* 101*  BUN 34* 32*  CREATININE 2.85* 2.67*  CALCIUM 9.3 8.9   GFR: Estimated Creatinine Clearance: 26.2 mL/min (A) (by C-G formula based on SCr of 2.67 mg/dL (H)). Liver Function Tests: No results for input(s): AST, ALT, ALKPHOS, BILITOT, PROT, ALBUMIN in the last 168 hours. No results for input(s): LIPASE, AMYLASE in the last 168 hours. No results for input(s): AMMONIA in the last 168 hours. Coagulation Profile: No results for input(s): INR, PROTIME in the last 168 hours. Cardiac Enzymes:  Recent Labs Lab 01/16/17 0139 01/16/17 0555  TROPONINI 0.08* 0.08*   BNP (last 3 results) No results for input(s): PROBNP in the last 8760 hours. HbA1C: No results for input(s): HGBA1C in the last 72 hours. CBG: No results for input(s): GLUCAP in the last 168 hours. Lipid Profile: No results for input(s): CHOL, HDL, LDLCALC, TRIG, CHOLHDL, LDLDIRECT in the last 72 hours. Thyroid Function Tests:  Recent Labs  01/16/17 0555  TSH 4.120   Anemia Panel: No results for input(s): VITAMINB12, FOLATE, FERRITIN, TIBC, IRON, RETICCTPCT in the last 72 hours. Sepsis Labs:  Recent Labs Lab 01/16/17 0555 01/16/17 0747 01/16/17 1112 01/17/17 0211  PROCALCITON <0.10  --   --   --   LATICACIDVEN 3.4* 2.9* 2.2* 1.1    Recent Results (from the past 240 hour(s))  Blood Culture (routine x 2)     Status: None (Preliminary result)    Collection Time: 01/16/17  3:14 AM  Result Value Ref Range Status   Specimen Description BLOOD LEFT ANTECUBITAL  Final   Special Requests   Final    BOTTLES DRAWN AEROBIC AND ANAEROBIC Blood Culture adequate volume   Culture NO GROWTH 1 DAY  Final   Report Status PENDING  Incomplete  Blood Culture (routine x 2)     Status: None (Preliminary result)   Collection Time: 01/16/17  3:22 AM  Result Value Ref Range Status   Specimen Description BLOOD RIGHT HAND  Final   Special Requests IN PEDIATRIC BOTTLE Blood Culture adequate volume  Final   Culture  Setup Time   Final    GRAM POSITIVE COCCI IN CLUSTERS IN PEDIATRIC BOTTLE CRITICAL RESULT CALLED TO, READ BACK BY AND VERIFIED WITH: A MASTERS,PHARMD AT 0908 01/17/17 BY L BENFIELD    Culture   Final    GRAM POSITIVE COCCI CULTURE REINCUBATED FOR BETTER GROWTH    Report Status PENDING  Incomplete  Blood Culture  ID Panel (Reflexed)     Status: Abnormal   Collection Time: 01/16/17  3:22 AM  Result Value Ref Range Status   Enterococcus species NOT DETECTED NOT DETECTED Final   Listeria monocytogenes NOT DETECTED NOT DETECTED Final   Staphylococcus species DETECTED (A) NOT DETECTED Final    Comment: Methicillin (oxacillin) susceptible coagulase negative staphylococcus. Possible blood culture contaminant (unless isolated from more than one blood culture draw or clinical case suggests pathogenicity). No antibiotic treatment is indicated for blood  culture contaminants. CRITICAL RESULT CALLED TO, READ BACK BY AND VERIFIED WITH: A MASTERS,PHARMD AT 0908 01/17/17 BY L BENFIELD    Staphylococcus aureus NOT DETECTED NOT DETECTED Final   Methicillin resistance NOT DETECTED NOT DETECTED Final   Streptococcus species NOT DETECTED NOT DETECTED Final   Streptococcus agalactiae NOT DETECTED NOT DETECTED Final   Streptococcus pneumoniae NOT DETECTED NOT DETECTED Final   Streptococcus pyogenes NOT DETECTED NOT DETECTED Final   Acinetobacter baumannii  NOT DETECTED NOT DETECTED Final   Enterobacteriaceae species NOT DETECTED NOT DETECTED Final   Enterobacter cloacae complex NOT DETECTED NOT DETECTED Final   Escherichia coli NOT DETECTED NOT DETECTED Final   Klebsiella oxytoca NOT DETECTED NOT DETECTED Final   Klebsiella pneumoniae NOT DETECTED NOT DETECTED Final   Proteus species NOT DETECTED NOT DETECTED Final   Serratia marcescens NOT DETECTED NOT DETECTED Final   Haemophilus influenzae NOT DETECTED NOT DETECTED Final   Neisseria meningitidis NOT DETECTED NOT DETECTED Final   Pseudomonas aeruginosa NOT DETECTED NOT DETECTED Final   Candida albicans NOT DETECTED NOT DETECTED Final   Candida glabrata NOT DETECTED NOT DETECTED Final   Candida krusei NOT DETECTED NOT DETECTED Final   Candida parapsilosis NOT DETECTED NOT DETECTED Final   Candida tropicalis NOT DETECTED NOT DETECTED Final  MRSA PCR Screening     Status: None   Collection Time: 01/16/17  7:04 AM  Result Value Ref Range Status   MRSA by PCR NEGATIVE NEGATIVE Final    Comment:        The GeneXpert MRSA Assay (FDA approved for NASAL specimens only), is one component of a comprehensive MRSA colonization surveillance program. It is not intended to diagnose MRSA infection nor to guide or monitor treatment for MRSA infections.          Radiology Studies: Nm Pulmonary Perf And Vent  Result Date: 01/16/2017 CLINICAL DATA:  Shortness of breath.  Hypoxia. EXAM: NUCLEAR MEDICINE VENTILATION - PERFUSION LUNG SCAN TECHNIQUE: Ventilation images were obtained in multiple projections using inhaled aerosol Tc-37m DTPA. Perfusion images were obtained in multiple projections after intravenous injection of Tc-8m MAA. RADIOPHARMACEUTICALS:  30.1 mCi Technetium-61m DTPA aerosol inhalation and 4.11 mCi Technetium-17m MAA IV COMPARISON:  None. FINDINGS: Ventilation: There is clumping of DTPA at the hila bilaterally, more prominent on the right. No ventilation defects are present  otherwise. Perfusion: There is a segmental defect involving the superior segment of the right lower lobe additional subsegmental defects are present anteriorly and inferiorly in the right lower lobe. There are moderate segmental defects involving the superior segment of the left lower lobe hand the basilar segments. IMPRESSION: High probability for pulmonary embolus. Electronically Signed   By: San Morelle M.D.   On: 01/16/2017 16:10   Dg Chest Port 1 View  Result Date: 01/16/2017 CLINICAL DATA:  Hypoxia.  Sudden onset of shortness of breath. EXAM: PORTABLE CHEST 1 VIEW COMPARISON:  Frontal and lateral views 11/26/2016 FINDINGS: The cardiomediastinal contours are normal. Small hiatal hernia. The lungs  are clear. Pulmonary vasculature is normal. No consolidation, pleural effusion, or pneumothorax. No acute osseous abnormalities are seen. IMPRESSION: No acute abnormality. Electronically Signed   By: Jeb Levering M.D.   On: 01/16/2017 01:39        Scheduled Meds: . citalopram  20 mg Oral Daily  . levothyroxine  88 mcg Oral QAC breakfast  . mouth rinse  15 mL Mouth Rinse BID  . predniSONE  30 mg Oral Q breakfast  . sodium bicarbonate  650 mg Oral BID  . sodium chloride flush  3 mL Intravenous Q12H   Continuous Infusions: . sodium chloride    . heparin 600 Units/hr (01/17/17 0544)  . [START ON 01/18/2017] levofloxacin (LEVAQUIN) IV    . vancomycin 750 mg (01/17/17 3086)     LOS: 1 day    Coral Timme Tanna Furry, MD [facility] Pager [phone]  If 7PM-7AM, please contact night-coverage [email or web address] Password [facility] 01/17/2017, 12:50 PM

## 2017-01-17 NOTE — Progress Notes (Signed)
ANTICOAGULATION CONSULT NOTE - Follow Up Consult  Pharmacy Consult for Heparin  Indication: Extensive bilateral DVTs, PE   Allergies  Allergen Reactions  . Proton Pump Inhibitors     Biopsy proven acute interstitial nephritis; required dialysis in 09/2016  . Protonix [Pantoprazole] Anaphylaxis  . Penicillins Rash and Other (See Comments)    Tolerates rocephin  . Sulfa Antibiotics Rash   Patient Measurements: Height: 5\' 2"  (157.5 cm) Weight: 256 lb 2.8 oz (116.2 kg) IBW/kg (Calculated) : 50.1  Vital Signs: Temp: 98.1 F (36.7 C) (05/11 2331) Temp Source: Oral (05/11 2331) BP: 122/83 (05/11 2331) Pulse Rate: 106 (05/11 2331)  Labs:  Recent Labs  01/16/17 0139 01/16/17 0555 01/16/17 1401 01/16/17 2254  HGB 11.7*  --   --   --   HCT 36.2  --   --   --   PLT 149*  --   --   --   APTT 20*  --   --   --   HEPARINUNFRC  --   --  1.64* 1.09*  CREATININE 2.85*  --   --   --   TROPONINI 0.08* 0.08*  --   --     Estimated Creatinine Clearance: 24.7 mL/min (A) (by C-G formula based on SCr of 2.85 mg/dL (H)).  Assessment: On heparin drip for extensive bilateral DVTs and high probability for PE per VQ scan. Heparin level remains supra-therapeutic despite rate decrease. No issues per RN.   Goal of Therapy:  Heparin level 0.3-0.7 units/ml Monitor platelets by anticoagulation protocol: Yes   Plan:  -Hold heparin x 30 mins -Re-start heparin drip at 600 units/hr at [place] -0900 HL  Narda Bonds 01/17/2017,12:03 AM

## 2017-01-17 NOTE — Progress Notes (Signed)
PHARMACY - PHYSICIAN COMMUNICATION CRITICAL VALUE ALERT - BLOOD CULTURE IDENTIFICATION (BCID)  Results for orders placed or performed during the hospital encounter of 01/16/17  Blood Culture ID Panel (Reflexed) (Collected: 01/16/2017  3:22 AM)  Result Value Ref Range   Enterococcus species NOT DETECTED NOT DETECTED   Listeria monocytogenes NOT DETECTED NOT DETECTED   Staphylococcus species DETECTED (A) NOT DETECTED   Staphylococcus aureus NOT DETECTED NOT DETECTED   Methicillin resistance NOT DETECTED NOT DETECTED   Streptococcus species NOT DETECTED NOT DETECTED   Streptococcus agalactiae NOT DETECTED NOT DETECTED   Streptococcus pneumoniae NOT DETECTED NOT DETECTED   Streptococcus pyogenes NOT DETECTED NOT DETECTED   Acinetobacter baumannii NOT DETECTED NOT DETECTED   Enterobacteriaceae species NOT DETECTED NOT DETECTED   Enterobacter cloacae complex NOT DETECTED NOT DETECTED   Escherichia coli NOT DETECTED NOT DETECTED   Klebsiella oxytoca NOT DETECTED NOT DETECTED   Klebsiella pneumoniae NOT DETECTED NOT DETECTED   Proteus species NOT DETECTED NOT DETECTED   Serratia marcescens NOT DETECTED NOT DETECTED   Haemophilus influenzae NOT DETECTED NOT DETECTED   Neisseria meningitidis NOT DETECTED NOT DETECTED   Pseudomonas aeruginosa NOT DETECTED NOT DETECTED   Candida albicans NOT DETECTED NOT DETECTED   Candida glabrata NOT DETECTED NOT DETECTED   Candida krusei NOT DETECTED NOT DETECTED   Candida parapsilosis NOT DETECTED NOT DETECTED   Candida tropicalis NOT DETECTED NOT DETECTED    Name of physician (or Provider) Contacted: N/A  Changes to prescribed antibiotics required: None  Pt is on vancomycin and levaquin, initial cultures showing 1/2 sets with staph species.   Harvel Quale 01/17/2017  9:20 AM

## 2017-01-18 ENCOUNTER — Inpatient Hospital Stay (HOSPITAL_COMMUNITY): Payer: Medicare HMO

## 2017-01-18 DIAGNOSIS — I824Y3 Acute embolism and thrombosis of unspecified deep veins of proximal lower extremity, bilateral: Secondary | ICD-10-CM

## 2017-01-18 LAB — ECHOCARDIOGRAM COMPLETE
CHL CUP DOP CALC LVOT VTI: 19.6 cm
CHL CUP TV REG PEAK VELOCITY: 413 cm/s
E/e' ratio: 10.34
EWDT: 257 ms
FS: 42 % (ref 28–44)
Height: 62 in
IVS/LV PW RATIO, ED: 1.02
LA diam end sys: 24 mm
LA diam index: 1.04 cm/m2
LA vol A4C: 30.1 ml
LA vol index: 11.6 mL/m2
LA vol: 26.7 mL
LASIZE: 24 mm
LV E/e' medial: 10.34
LV E/e'average: 10.34
LV PW d: 10.2 mm — AB (ref 0.6–1.1)
LV SIMPSON'S DISK: 71
LV dias vol: 41 mL — AB (ref 46–106)
LV e' LATERAL: 7.83 cm/s
LV sys vol: 12 mL — AB (ref 14–42)
LVDIAVOLIN: 18 mL/m2
LVOT SV: 56 mL
LVOT area: 2.84 cm2
LVOT peak grad rest: 6 mmHg
LVOTD: 19 mm
LVOTPV: 118 cm/s
LVSYSVOLIN: 5 mL/m2
MV Dec: 257
MV Peak grad: 3 mmHg
MV pk A vel: 130 m/s
MV pk E vel: 81 m/s
Stroke v: 29 ml
TDI e' lateral: 7.83
TDI e' medial: 6.53
TR max vel: 413 cm/s
Weight: 4028.25 oz

## 2017-01-18 LAB — PROTIME-INR
INR: 1.37
Prothrombin Time: 17 seconds — ABNORMAL HIGH (ref 11.4–15.2)

## 2017-01-18 LAB — BASIC METABOLIC PANEL
Anion gap: 10 (ref 5–15)
BUN: 33 mg/dL — ABNORMAL HIGH (ref 6–20)
CALCIUM: 8.9 mg/dL (ref 8.9–10.3)
CHLORIDE: 105 mmol/L (ref 101–111)
CO2: 24 mmol/L (ref 22–32)
CREATININE: 3.07 mg/dL — AB (ref 0.44–1.00)
GFR calc Af Amer: 18 mL/min — ABNORMAL LOW (ref >60)
GFR calc non Af Amer: 15 mL/min — ABNORMAL LOW (ref >60)
GLUCOSE: 100 mg/dL — AB (ref 65–99)
Potassium: 3.3 mmol/L — ABNORMAL LOW (ref 3.5–5.1)
Sodium: 139 mmol/L (ref 135–145)

## 2017-01-18 LAB — CBC
HEMATOCRIT: 30 % — AB (ref 36.0–46.0)
HEMOGLOBIN: 10.2 g/dL — AB (ref 12.0–15.0)
MCH: 30.7 pg (ref 26.0–34.0)
MCHC: 34 g/dL (ref 30.0–36.0)
MCV: 90.4 fL (ref 78.0–100.0)
Platelets: 92 10*3/uL — ABNORMAL LOW (ref 150–400)
RBC: 3.32 MIL/uL — AB (ref 3.87–5.11)
RDW: 16.8 % — ABNORMAL HIGH (ref 11.5–15.5)
WBC: 7.3 10*3/uL (ref 4.0–10.5)

## 2017-01-18 LAB — HEPARIN LEVEL (UNFRACTIONATED): Heparin Unfractionated: 0.46 IU/mL (ref 0.30–0.70)

## 2017-01-18 LAB — CULTURE, BLOOD (ROUTINE X 2): Special Requests: ADEQUATE

## 2017-01-18 MED ORDER — PERFLUTREN LIPID MICROSPHERE
INTRAVENOUS | Status: AC
  Administered 2017-01-18: 4 mL
  Filled 2017-01-18: qty 10

## 2017-01-18 MED ORDER — LEVOFLOXACIN 500 MG PO TABS
500.0000 mg | ORAL_TABLET | ORAL | Status: DC
  Administered 2017-01-20: 500 mg via ORAL
  Filled 2017-01-18: qty 1

## 2017-01-18 MED ORDER — WARFARIN SODIUM 5 MG PO TABS
7.5000 mg | ORAL_TABLET | Freq: Once | ORAL | Status: AC
Start: 2017-01-18 — End: 2017-01-18
  Administered 2017-01-18: 7.5 mg via ORAL
  Filled 2017-01-18: qty 2

## 2017-01-18 NOTE — Progress Notes (Signed)
PROGRESS NOTE    Rachael [name]  ZES:[id number] DOB: February 25, 1956 DOA: 01/16/2017 PCP: Ronita Hipps, MD   Brief Narrative: 61 y.o. female with medical history significant of CHF last EF 60-65% in 10/2016, HTN, hypothyroidism, CVA w/o residual deficits; who presents with acute onset of shortness of breath. Patient was just admitted into the hospital with acute renal failure from 3/21-3/29. In the ER patient was found to have hypoxia, possible sepsis. Chest x-ray clear. Also started on IV heparin for possible PE.  Assessment & Plan:   #Possible sepsis of unknown etiology: Blood culture 1 bottle growing coag-negative staph likely contaminant. For now continue broad spectrum antibiotics. Sepsis parameters improving. Follow up final culture result. -Plan to discontinue antibiotics after growth 5 days. Today 3/5  #Acute respiratory failure with hypoxia likely due to PE: CT scan chest with contrast was not able to don because of renal failure. Bilateral lower extremity Doppler consistent with extensive DVT bilateral. Currently on IV heparin. -Vascular consult appreciated. The V/Q scan consistent with hypermobility for PE. I discussed with Dr. Donzetta Matters from vascular surgery on 5/12. He recommended to continue anticoagulation for now and no need for IVC filter. -Requiring about 4-5 L of oxygen. Continue to monitor supportive care and bronchodilators. -pending echocardiogram -Started the Coumadin. Monitor INR.  #Presumed CHF on admission which is less likely. Patient is euvolemic. Continue to monitor. Mild elevation in troponin likely in the setting of renal failure and possible PE. -Patient had TEE on 10/27/2016 showed EF of 60-65% with normal wall motion.  #Likely chronic kidney disease is stage IV: On reviewing patient's records outpatient serum creatinine level is slowly trending down. Patient had a kidney biopsy proven AIN after PPI use. Currently on prednisone tapering dose.  continue to monitor serum  creatinine level. Creatinine level trended off today. Monitor BMP. -Start oral sodium bicarbonate.  #Hypothyroidism: Continue Synthroid.  Continue current medical and supportive care.  Principal Problem:   Sepsis ([place]) Active Problems:   Hypothyroidism   History of CVA (cerebrovascular accident)   Depression   CKD (chronic kidney disease), stage IV (HCC)     Elevated troponin  DVT prophylaxis: IV heparin and Coumadin Code Status: Full code Family Communication: No family at bedside Disposition Plan: Likely discharge home in 1-2 days    Consultants:   Vascular surgery  Procedures: Vascular Doppler Antimicrobials: Vancomycin and Levaquin since 5/11  Subjective: Patient was seen and examined at bedside. Feels better. Denied headache, dizziness, chest pain, shortness of breath, leg pain. Objective: Vitals:   01/18/17 0432 01/18/17 0500 01/18/17 0823 01/18/17 1012  BP: (!) 148/87  108/83   Pulse:  88 97   Resp:  14 18   Temp: 98.2 F (36.8 C)  98.2 F (36.8 C)   TempSrc: Oral  Oral   SpO2:  93% 92%   Weight:    114.2 kg (251 lb 12.3 oz)  Height:        Intake/Output Summary (Last 24 hours) at 01/18/17 1157 Last data filed at 01/18/17 1116  Gross per 24 hour  Intake            382.6 ml  Output              750 ml  Net           -367.4 ml   Filed Weights   01/16/17 0655 01/17/17 0500 01/18/17 1012  Weight: 116.2 kg (256 lb 2.8 oz) 114.6 kg (252 lb 10.4 oz) 114.2 kg (251 lb  12.3 oz)    Examination:  General exam: Lying in bed comfortable, slept well last night, not in distress Respiratory system: Clear bilateral, respiratory effort normal. No wheezing or crackle.  Cardiovascular system: Regular rate rhythm, S1-2 normal. Nonpitting lower extremity edema. Gastrointestinal system: Abdomen is nondistended, soft and nontender. Normal bowel sounds heard. Central nervous system: Alert and oriented. No focal neurological deficits. Extremities: Symmetric 5 x 5  power. Skin: No rashes, lesions or ulcers Psychiatry: Judgement and insight appear normal. Mood & affect appropriate.     Data Reviewed: I have personally reviewed following labs and imaging studies  CBC:  Recent Labs Lab 01/16/17 0139 01/17/17 0211 01/18/17 0524  WBC 12.4* 9.5 7.3  NEUTROABS 10.2*  --   --   HGB 11.7* 10.3* 10.2*  HCT 36.2 30.8* 30.0*  MCV 91.6 91.1 90.4  PLT 149* 109* 92*   Basic Metabolic Panel:  Recent Labs Lab 01/16/17 0139 01/17/17 0211 01/18/17 0524  NA 139 141 139  K 3.8 3.4* 3.3*  CL 108 106 105  CO2 18* 24 24  GLUCOSE 158* 101* 100*  BUN 34* 32* 33*  CREATININE 2.85* 2.67* 3.07*  CALCIUM 9.3 8.9 8.9   GFR: Estimated Creatinine Clearance: 22.7 mL/min (A) (by C-G formula based on SCr of 3.07 mg/dL (H)). Liver Function Tests: No results for input(s): AST, ALT, ALKPHOS, BILITOT, PROT, ALBUMIN in the last 168 hours. No results for input(s): LIPASE, AMYLASE in the last 168 hours. No results for input(s): AMMONIA in the last 168 hours. Coagulation Profile:  Recent Labs Lab 01/18/17 0524  INR 1.37   Cardiac Enzymes:  Recent Labs Lab 01/16/17 0139 01/16/17 0555  TROPONINI 0.08* 0.08*   BNP (last 3 results) No results for input(s): PROBNP in the last 8760 hours. HbA1C: No results for input(s): HGBA1C in the last 72 hours. CBG: No results for input(s): GLUCAP in the last 168 hours. Lipid Profile: No results for input(s): CHOL, HDL, LDLCALC, TRIG, CHOLHDL, LDLDIRECT in the last 72 hours. Thyroid Function Tests:  Recent Labs  01/16/17 0555  TSH 4.120   Anemia Panel: No results for input(s): VITAMINB12, FOLATE, FERRITIN, TIBC, IRON, RETICCTPCT in the last 72 hours. Sepsis Labs:  Recent Labs Lab 01/16/17 0555 01/16/17 0747 01/16/17 1112 01/17/17 0211  PROCALCITON <0.10  --   --   --   LATICACIDVEN 3.4* 2.9* 2.2* 1.1    Recent Results (from the past 240 hour(s))  Blood Culture (routine x 2)     Status: None  (Preliminary result)   Collection Time: 01/16/17  3:14 AM  Result Value Ref Range Status   Specimen Description BLOOD LEFT ANTECUBITAL  Final   Special Requests   Final    BOTTLES DRAWN AEROBIC AND ANAEROBIC Blood Culture adequate volume   Culture NO GROWTH 2 DAYS  Final   Report Status PENDING  Incomplete  Blood Culture (routine x 2)     Status: Abnormal   Collection Time: 01/16/17  3:22 AM  Result Value Ref Range Status   Specimen Description BLOOD RIGHT HAND  Final   Special Requests IN PEDIATRIC BOTTLE Blood Culture adequate volume  Final   Culture  Setup Time   Final    GRAM POSITIVE COCCI IN CLUSTERS IN PEDIATRIC BOTTLE CRITICAL RESULT CALLED TO, READ BACK BY AND VERIFIED WITH: A MASTERS,PHARMD AT 0908 01/17/17 BY L BENFIELD    Culture (A)  Final    STAPHYLOCOCCUS SPECIES (COAGULASE NEGATIVE) THE SIGNIFICANCE OF ISOLATING THIS ORGANISM FROM A SINGLE SET  OF BLOOD CULTURES WHEN MULTIPLE SETS ARE DRAWN IS UNCERTAIN. PLEASE NOTIFY THE MICROBIOLOGY DEPARTMENT WITHIN ONE WEEK IF SPECIATION AND SENSITIVITIES ARE REQUIRED.    Report Status 01/18/2017 FINAL  Final  Blood Culture ID Panel (Reflexed)     Status: Abnormal   Collection Time: 01/16/17  3:22 AM  Result Value Ref Range Status   Enterococcus species NOT DETECTED NOT DETECTED Final   Listeria monocytogenes NOT DETECTED NOT DETECTED Final   Staphylococcus species DETECTED (A) NOT DETECTED Final    Comment: Methicillin (oxacillin) susceptible coagulase negative staphylococcus. Possible blood culture contaminant (unless isolated from more than one blood culture draw or clinical case suggests pathogenicity). No antibiotic treatment is indicated for blood  culture contaminants. CRITICAL RESULT CALLED TO, READ BACK BY AND VERIFIED WITH: A MASTERS,PHARMD AT 0908 01/17/17 BY L BENFIELD    Staphylococcus aureus NOT DETECTED NOT DETECTED Final   Methicillin resistance NOT DETECTED NOT DETECTED Final   Streptococcus species NOT DETECTED  NOT DETECTED Final   Streptococcus agalactiae NOT DETECTED NOT DETECTED Final   Streptococcus pneumoniae NOT DETECTED NOT DETECTED Final   Streptococcus pyogenes NOT DETECTED NOT DETECTED Final   Acinetobacter baumannii NOT DETECTED NOT DETECTED Final   Enterobacteriaceae species NOT DETECTED NOT DETECTED Final   Enterobacter cloacae complex NOT DETECTED NOT DETECTED Final   Escherichia coli NOT DETECTED NOT DETECTED Final   Klebsiella oxytoca NOT DETECTED NOT DETECTED Final   Klebsiella pneumoniae NOT DETECTED NOT DETECTED Final   Proteus species NOT DETECTED NOT DETECTED Final   Serratia marcescens NOT DETECTED NOT DETECTED Final   Haemophilus influenzae NOT DETECTED NOT DETECTED Final   Neisseria meningitidis NOT DETECTED NOT DETECTED Final   Pseudomonas aeruginosa NOT DETECTED NOT DETECTED Final   Candida albicans NOT DETECTED NOT DETECTED Final   Candida glabrata NOT DETECTED NOT DETECTED Final   Candida krusei NOT DETECTED NOT DETECTED Final   Candida parapsilosis NOT DETECTED NOT DETECTED Final   Candida tropicalis NOT DETECTED NOT DETECTED Final  MRSA PCR Screening     Status: None   Collection Time: 01/16/17  7:04 AM  Result Value Ref Range Status   MRSA by PCR NEGATIVE NEGATIVE Final    Comment:        The GeneXpert MRSA Assay (FDA approved for NASAL specimens only), is one component of a comprehensive MRSA colonization surveillance program. It is not intended to diagnose MRSA infection nor to guide or monitor treatment for MRSA infections.          Radiology Studies: Nm Pulmonary Perf And Vent  Result Date: 01/16/2017 CLINICAL DATA:  Shortness of breath.  Hypoxia. EXAM: NUCLEAR MEDICINE VENTILATION - PERFUSION LUNG SCAN TECHNIQUE: Ventilation images were obtained in multiple projections using inhaled aerosol Tc-86m DTPA. Perfusion images were obtained in multiple projections after intravenous injection of Tc-52m MAA. RADIOPHARMACEUTICALS:  30.1 mCi  Technetium-71m DTPA aerosol inhalation and 4.11 mCi Technetium-45m MAA IV COMPARISON:  None. FINDINGS: Ventilation: There is clumping of DTPA at the hila bilaterally, more prominent on the right. No ventilation defects are present otherwise. Perfusion: There is a segmental defect involving the superior segment of the right lower lobe additional subsegmental defects are present anteriorly and inferiorly in the right lower lobe. There are moderate segmental defects involving the superior segment of the left lower lobe hand the basilar segments. IMPRESSION: High probability for pulmonary embolus. Electronically Signed   By: San Morelle M.D.   On: 01/16/2017 16:10        Scheduled Meds: .  citalopram  20 mg Oral Daily  . [START ON 01/20/2017] levofloxacin  500 mg Oral Q48H  . levothyroxine  88 mcg Oral QAC breakfast  . mouth rinse  15 mL Mouth Rinse BID  . predniSONE  30 mg Oral Q breakfast  . sodium bicarbonate  650 mg Oral BID  . sodium chloride flush  3 mL Intravenous Q12H  . warfarin  7.5 mg Oral ONCE-1800  . Warfarin - Pharmacist Dosing Inpatient   Does not apply q1800   Continuous Infusions: . sodium chloride    . heparin 600 Units/hr (01/17/17 0544)  . vancomycin Stopped (01/18/17 0720)     LOS: 2 days    Dron Tanna Furry, MD [facility] Pager [phone]  If 7PM-7AM, please contact night-coverage [email or web address] Password [facility] 01/18/2017, 11:57 AM

## 2017-01-18 NOTE — Progress Notes (Signed)
PHARMACIST - PHYSICIAN COMMUNICATION DR:   Carolin Sicks CONCERNING: Antibiotic IV to Oral Route Change Policy  RECOMMENDATION: This patient is receiving levofloxacin by the intravenous route.  Based on criteria approved by the Pharmacy and Therapeutics Committee, the antibiotic(s) is/are being converted to the equivalent oral dose form(s).   DESCRIPTION: These criteria include:  Patient being treated for a respiratory tract infection, urinary tract infection, cellulitis or clostridium difficile associated diarrhea if on metronidazole  The patient is not neutropenic and does not exhibit a GI malabsorption state  The patient is eating (either orally or via tube) and/or has been taking other orally administered medications for a least 24 hours  The patient is improving clinically and has a Tmax < 100.5  If you have questions about this conversion, please contact the Pharmacy Department  [x]   [phone] )  Zacarias Pontes   Stark Klein, PharmD Clinical Pharmacy Resident [phone] (Pager) 01/18/2017 8:35 AM

## 2017-01-18 NOTE — Progress Notes (Signed)
Run of PSVT 12 beats- patient is asymptomatic- will continue to monitor and report to [facility] team.

## 2017-01-18 NOTE — Progress Notes (Signed)
*  PRELIMINARY RESULTS* Echocardiogram 2D Echocardiogram has been performed with Definity.  Samuel Germany 01/18/2017, 5:23 PM

## 2017-01-18 NOTE — Progress Notes (Signed)
ANTICOAGULATION CONSULT NOTE - Follow Up Consult  Pharmacy Consult for Heparin  Indication: Extensive bilateral DVTs, PE   Allergies  Allergen Reactions  . Proton Pump Inhibitors     Biopsy proven acute interstitial nephritis; required dialysis in 09/2016  . Protonix [Pantoprazole] Anaphylaxis  . Penicillins Rash and Other (See Comments)    Tolerates rocephin  . Sulfa Antibiotics Rash   Patient Measurements: Height: 5\' 2"  (157.5 cm) Weight: 252 lb 10.4 oz (114.6 kg) IBW/kg (Calculated) : 50.1  Vital Signs: Temp: 98.2 F (36.8 C) (05/13 0823) Temp Source: Oral (05/13 0823) BP: 108/83 (05/13 0823) Pulse Rate: 97 (05/13 0823)  Labs:  Recent Labs  01/16/17 0139 01/16/17 0555  01/17/17 0211 01/17/17 0844 01/17/17 1657 01/18/17 0524  HGB 11.7*  --   --  10.3*  --   --  10.2*  HCT 36.2  --   --  30.8*  --   --  30.0*  PLT 149*  --   --  109*  --   --  92*  APTT 20*  --   --   --   --   --   --   LABPROT  --   --   --   --   --   --  17.0*  INR  --   --   --   --   --   --  1.37  HEPARINUNFRC  --   --   < >  --  0.61 0.46 0.46  CREATININE 2.85*  --   --  2.67*  --   --  3.07*  TROPONINI 0.08* 0.08*  --   --   --   --   --   < > = values in this interval not displayed.  Estimated Creatinine Clearance: 22.8 mL/min (A) (by C-G formula based on SCr of 3.07 mg/dL (H)).  Assessment: On warfarin with heparin bridge for extensive bilateral DVTs and high probability for PE per VQ scan. Heparin within range. Hgb 10.3>10.2, pltc dropped from 149>92. No bleeding per nurse.   The patient has never been on [facility] per notes and has no liver dysfunction. She did have a CVA in 2015 without residual deficits. Will need a minimum of 5 days of heparin bridge until the INR>2.0 x2 days   Goal of Therapy:  INR 2-3 Heparin level 0.3-0.7 units/ml Monitor platelets by anticoagulation protocol: Yes   Plan:  - Warfarin 7.5 mg PO x1 tonight - Continue heparin drip at 600 units/hr - Daily HL and  CBC - Monitor pltc and s/sx of bleeding  Dierdre Harness, Cain Sieve, PharmD Clinical Pharmacy Resident [phone] (Pager) 01/18/2017 8:26 AM

## 2017-01-19 LAB — BASIC METABOLIC PANEL
ANION GAP: 11 (ref 5–15)
BUN: 34 mg/dL — ABNORMAL HIGH (ref 6–20)
CALCIUM: 8.8 mg/dL — AB (ref 8.9–10.3)
CO2: 24 mmol/L (ref 22–32)
Chloride: 103 mmol/L (ref 101–111)
Creatinine, Ser: 2.76 mg/dL — ABNORMAL HIGH (ref 0.44–1.00)
GFR, EST AFRICAN AMERICAN: 20 mL/min — AB (ref >60)
GFR, EST NON AFRICAN AMERICAN: 17 mL/min — AB (ref >60)
Glucose, Bld: 125 mg/dL — ABNORMAL HIGH (ref 65–99)
Potassium: 3.9 mmol/L (ref 3.5–5.1)
Sodium: 138 mmol/L (ref 135–145)

## 2017-01-19 LAB — PROTIME-INR
INR: 1.34
Prothrombin Time: 16.7 seconds — ABNORMAL HIGH (ref 11.4–15.2)

## 2017-01-19 LAB — CBC
HCT: 27.8 % — ABNORMAL LOW (ref 36.0–46.0)
Hemoglobin: 9.6 g/dL — ABNORMAL LOW (ref 12.0–15.0)
MCH: 30.7 pg (ref 26.0–34.0)
MCHC: 34.5 g/dL (ref 30.0–36.0)
MCV: 88.8 fL (ref 78.0–100.0)
Platelets: 91 10*3/uL — ABNORMAL LOW (ref 150–400)
RBC: 3.13 MIL/uL — AB (ref 3.87–5.11)
RDW: 16.5 % — ABNORMAL HIGH (ref 11.5–15.5)
WBC: 7 10*3/uL (ref 4.0–10.5)

## 2017-01-19 LAB — HEPARIN LEVEL (UNFRACTIONATED): Heparin Unfractionated: 0.34 IU/mL (ref 0.30–0.70)

## 2017-01-19 MED ORDER — WARFARIN SODIUM 5 MG PO TABS
10.0000 mg | ORAL_TABLET | Freq: Once | ORAL | Status: AC
  Administered 2017-01-19: 10 mg via ORAL
  Filled 2017-01-19: qty 2

## 2017-01-19 NOTE — Progress Notes (Signed)
ANTICOAGULATION CONSULT NOTE - Follow Up Consult  Pharmacy Consult for Heparin  Indication: Extensive bilateral DVTs, PE   Allergies  Allergen Reactions  . Proton Pump Inhibitors     Biopsy proven acute interstitial nephritis; required dialysis in 09/2016  . Protonix [Pantoprazole] Anaphylaxis  . Penicillins Rash and Other (See Comments)    Tolerates rocephin  . Sulfa Antibiotics Rash   Patient Measurements: Height: 5\' 2"  (157.5 cm) Weight: 251 lb 12.3 oz (114.2 kg) IBW/kg (Calculated) : 50.1  Vital Signs: Temp: 98.2 F (36.8 C) (05/14 0308) Temp Source: Oral (05/14 0308) BP: 116/81 (05/14 0308) Pulse Rate: 93 (05/14 0308)  Labs:  Recent Labs  01/17/17 0211  01/17/17 1657 01/18/17 0524 01/19/17 0327  HGB 10.3*  --   --  10.2* 9.6*  HCT 30.8*  --   --  30.0* 27.8*  PLT 109*  --   --  92* 91*  LABPROT  --   --   --  17.0* 16.7*  INR  --   --   --  1.37 1.34  HEPARINUNFRC  --   < > 0.46 0.46 0.34  CREATININE 2.67*  --   --  3.07* 2.76*  < > = values in this interval not displayed.  Estimated Creatinine Clearance: 25.3 mL/min (A) (by C-G formula based on SCr of 2.76 mg/dL (H)).  Assessment: On warfarin with heparin bridge for extensive bilateral DVTs and high probability for PE per VQ scan. Heparin level remains therapeutic this am. Hgb 9.6 and PLTc remains low but stable.    The patient has never been on [facility] per notes and has no liver dysfunction. She did have a CVA in 2015 without residual deficits. Will need a minimum of 5 days of heparin bridge until the INR>2.0 x2 days.   Goal of Therapy:  INR 2-3 Heparin level 0.3-0.7 units/ml Monitor platelets by anticoagulation protocol: Yes   Plan:  - Warfarin 10 mg PO x1 tonight - Increase heparin drip slightly to 650 units/hr - Daily HL and CBC - Monitor pltc and s/sx of bleeding  Vincenza Hews, PharmD, BCPS 01/19/2017, 7:56 AM

## 2017-01-19 NOTE — Progress Notes (Signed)
Pharmacy Antibiotic Note Rachael [name] is a 61 y.o. female admitted on 01/16/2017 with bilateral DVT with PE and concern for sepsis. Currently on day 4 of empiric therapy with Levaquin and vancomycin.    Plan: 1. Levaquin 500 mg PO every 48 hours  2. Continue vancomycin 750 mg every 24 hours for now 3. Consider stopping vancomycin with negative MRSA PCR and PCT < 0.10   Height: 5\' 2"  (157.5 cm) Weight: 251 lb 12.3 oz (114.2 kg) IBW/kg (Calculated) : 50.1  Temp (24hrs), Avg:98.2 F (36.8 C), Min:98.1 F (36.7 C), Max:98.2 F (36.8 C)   Recent Labs Lab 01/16/17 0139 01/16/17 0328 01/16/17 0555 01/16/17 0747 01/16/17 1112 01/17/17 0211 01/18/17 0524 01/19/17 0327  WBC 12.4*  --   --   --   --  9.5 7.3 7.0  CREATININE 2.85*  --   --   --   --  2.67* 3.07* 2.76*  LATICACIDVEN  --  2.68* 3.4* 2.9* 2.2* 1.1  --   --     Estimated Creatinine Clearance: 25.3 mL/min (A) (by C-G formula based on SCr of 2.76 mg/dL (H)).    Allergies  Allergen Reactions  . Proton Pump Inhibitors     Biopsy proven acute interstitial nephritis; required dialysis in 09/2016  . Protonix [Pantoprazole] Anaphylaxis  . Penicillins Rash and Other (See Comments)    Tolerates rocephin  . Sulfa Antibiotics Rash    Antimicrobials this admission: Levaquin 5/12> Vancomycin 5/11 >>  Microbiology results: 5/11 BCx: 1/2 staph spp per BCID 5/11 BCx: px 5/11 MRSA PCR neg  Thank you for allowing pharmacy to be a part of this patient's care.  Vincenza Hews, PharmD, BCPS 01/19/2017, 8:07 AM

## 2017-01-19 NOTE — Progress Notes (Addendum)
PROGRESS NOTE    Rachael [name]  OFB:[id number] DOB: 1956/02/05 DOA: 01/16/2017 PCP: Ronita Hipps, MD   Brief Narrative: 61 y.o. female with medical history significant of CHF last EF 60-65% in 10/2016, HTN, hypothyroidism, CVA w/o residual deficits; who presents with acute onset of shortness of breath. Patient was just admitted into the hospital with acute renal failure from 3/21-3/29. In the ER patient was found to have hypoxia, possible sepsis. Chest x-ray clear. Also started on IV heparin for possible PE.  Assessment & Plan:   #Possible sepsis of unknown etiology or source (clinically undetermined): Blood culture 1 bottle growing coag-negative staph likely contaminant.  Sepsis parameters improving. Follow up final culture result. Received 4 days of IV vancomycin. MRSA PCR negative. I'll discontinue vancomycin. Continue Levaquin today and tomorrow to complete 5 days course.  #Acute respiratory failure with hypoxia likely due to PE: CT scan chest with contrast was not able to do because of renal failure. Bilateral lower extremity Doppler consistent with extensive DVT bilateral. Currently on IV heparin and bridge with Coumadin. INR 1.3. Continue IV heparin till therapeutic INR. -Vascular consult appreciated. The V/Q scan consistent with high probability for PE. I discussed with Dr. Donzetta Matters from vascular surgery on 5/12. He recommended to continue anticoagulation for now and no need for IVC filter. -Requiring about 2-3 L of oxygen. Continue to monitor supportive care and bronchodilators. -echocardiogram with EF of 60-65%. Normal wall motion.   #Probable CHF exacerbation on admission which is less likely. Patient is euvolemic. Continue to monitor. Mild elevation in troponin likely in the setting of renal failure and possible PE. -Patient had TEE on 10/27/2016 showed EF of 60-65% with normal wall motion.  #Likely chronic kidney disease is stage IV: On reviewing patient's records outpatient serum  creatinine level is slowly trending down. Patient had a kidney biopsy proven AIN after PPI use. Currently on prednisone tapering dose.  continue to monitor serum creatinine level.Monitor BMP. Serum creatinine level is stable. -Start oral sodium bicarbonate.  #Hypothyroidism: Continue Synthroid.  # morbid obesity  Continue current medical and supportive care. PT/OT eval  Principal Problem:   Sepsis ([place]) Active Problems:   Hypothyroidism   History of CVA (cerebrovascular accident)   Depression   CKD (chronic kidney disease), stage IV (HCC)     Elevated troponin  DVT prophylaxis: IV heparin and Coumadin Code Status: Full code Family Communication: No family at bedside Disposition Plan: Likely discharge home in 1-2 days    Consultants:   Vascular surgery  Procedures: Vascular Doppler Antimicrobials: Vancomycin 5/11-5/14  Levaquin since 5/11  Subjective: Patient was seen and examined at bedside. No headache, dizziness, chest pain, shortness of breath or leg pain. Objective: Vitals:   01/18/17 2121 01/18/17 2333 01/19/17 0308 01/19/17 0855  BP: 120/81 138/87 116/81 114/68  Pulse: 99 100 93 88  Resp: 16 15 15 12   Temp: 98.1 F (36.7 C) 98.2 F (36.8 C) 98.2 F (36.8 C) 98.2 F (36.8 C)  TempSrc: Oral Oral Oral Oral  SpO2: 92% 97% 93% 93%  Weight:      Height:        Intake/Output Summary (Last 24 hours) at 01/19/17 1158 Last data filed at 01/19/17 0900  Gross per 24 hour  Intake              576 ml  Output                0 ml  Net  576 ml   Filed Weights   01/16/17 0655 01/17/17 0500 01/18/17 1012  Weight: 116.2 kg (256 lb 2.8 oz) 114.6 kg (252 lb 10.4 oz) 114.2 kg (251 lb 12.3 oz)    Examination:  General exam: Not in distress Respiratory system: Clear lungs, respiratory effort normal  Cardiovascular system: Regular rate rhythm, S1-2 normal. Nonpitting lower extremity edema. Gastrointestinal system: Abdomen is nondistended, soft and  nontender. Normal bowel sounds heard. Central nervous system: Alert, awake, oriented. Extremities: Symmetric 5 x 5 power. Skin: No rashes, lesions or ulcers Psychiatry: Judgement and insight appear normal. Mood & affect appropriate.     Data Reviewed: I have personally reviewed following labs and imaging studies  CBC:  Recent Labs Lab 01/16/17 0139 01/17/17 0211 01/18/17 0524 01/19/17 0327  WBC 12.4* 9.5 7.3 7.0  NEUTROABS 10.2*  --   --   --   HGB 11.7* 10.3* 10.2* 9.6*  HCT 36.2 30.8* 30.0* 27.8*  MCV 91.6 91.1 90.4 88.8  PLT 149* 109* 92* 91*   Basic Metabolic Panel:  Recent Labs Lab 01/16/17 0139 01/17/17 0211 01/18/17 0524 01/19/17 0327  NA 139 141 139 138  K 3.8 3.4* 3.3* 3.9  CL 108 106 105 103  CO2 18* 24 24 24   GLUCOSE 158* 101* 100* 125*  BUN 34* 32* 33* 34*  CREATININE 2.85* 2.67* 3.07* 2.76*  CALCIUM 9.3 8.9 8.9 8.8*   GFR: Estimated Creatinine Clearance: 25.3 mL/min (A) (by C-G formula based on SCr of 2.76 mg/dL (H)). Liver Function Tests: No results for input(s): AST, ALT, ALKPHOS, BILITOT, PROT, ALBUMIN in the last 168 hours. No results for input(s): LIPASE, AMYLASE in the last 168 hours. No results for input(s): AMMONIA in the last 168 hours. Coagulation Profile:  Recent Labs Lab 01/18/17 0524 01/19/17 0327  INR 1.37 1.34   Cardiac Enzymes:  Recent Labs Lab 01/16/17 0139 01/16/17 0555  TROPONINI 0.08* 0.08*   BNP (last 3 results) No results for input(s): PROBNP in the last 8760 hours. HbA1C: No results for input(s): HGBA1C in the last 72 hours. CBG: No results for input(s): GLUCAP in the last 168 hours. Lipid Profile: No results for input(s): CHOL, HDL, LDLCALC, TRIG, CHOLHDL, LDLDIRECT in the last 72 hours. Thyroid Function Tests: No results for input(s): TSH, T4TOTAL, FREET4, T3FREE, THYROIDAB in the last 72 hours. Anemia Panel: No results for input(s): VITAMINB12, FOLATE, FERRITIN, TIBC, IRON, RETICCTPCT in the last 72  hours. Sepsis Labs:  Recent Labs Lab 01/16/17 0555 01/16/17 0747 01/16/17 1112 01/17/17 0211  PROCALCITON <0.10  --   --   --   LATICACIDVEN 3.4* 2.9* 2.2* 1.1    Recent Results (from the past 240 hour(s))  Blood Culture (routine x 2)     Status: None (Preliminary result)   Collection Time: 01/16/17  3:14 AM  Result Value Ref Range Status   Specimen Description BLOOD LEFT ANTECUBITAL  Final   Special Requests   Final    BOTTLES DRAWN AEROBIC AND ANAEROBIC Blood Culture adequate volume   Culture NO GROWTH 3 DAYS  Final   Report Status PENDING  Incomplete  Blood Culture (routine x 2)     Status: Abnormal   Collection Time: 01/16/17  3:22 AM  Result Value Ref Range Status   Specimen Description BLOOD RIGHT HAND  Final   Special Requests IN PEDIATRIC BOTTLE Blood Culture adequate volume  Final   Culture  Setup Time   Final    GRAM POSITIVE COCCI IN CLUSTERS IN PEDIATRIC BOTTLE  CRITICAL RESULT CALLED TO, READ BACK BY AND VERIFIED WITH: A MASTERS,PHARMD AT 0908 01/17/17 BY L BENFIELD    Culture (A)  Final    STAPHYLOCOCCUS SPECIES (COAGULASE NEGATIVE) THE SIGNIFICANCE OF ISOLATING THIS ORGANISM FROM A SINGLE SET OF BLOOD CULTURES WHEN MULTIPLE SETS ARE DRAWN IS UNCERTAIN. PLEASE NOTIFY THE MICROBIOLOGY DEPARTMENT WITHIN ONE WEEK IF SPECIATION AND SENSITIVITIES ARE REQUIRED.    Report Status 01/18/2017 FINAL  Final  Blood Culture ID Panel (Reflexed)     Status: Abnormal   Collection Time: 01/16/17  3:22 AM  Result Value Ref Range Status   Enterococcus species NOT DETECTED NOT DETECTED Final   Listeria monocytogenes NOT DETECTED NOT DETECTED Final   Staphylococcus species DETECTED (A) NOT DETECTED Final    Comment: Methicillin (oxacillin) susceptible coagulase negative staphylococcus. Possible blood culture contaminant (unless isolated from more than one blood culture draw or clinical case suggests pathogenicity). No antibiotic treatment is indicated for blood  culture  contaminants. CRITICAL RESULT CALLED TO, READ BACK BY AND VERIFIED WITH: A MASTERS,PHARMD AT 0908 01/17/17 BY L BENFIELD    Staphylococcus aureus NOT DETECTED NOT DETECTED Final   Methicillin resistance NOT DETECTED NOT DETECTED Final   Streptococcus species NOT DETECTED NOT DETECTED Final   Streptococcus agalactiae NOT DETECTED NOT DETECTED Final   Streptococcus pneumoniae NOT DETECTED NOT DETECTED Final   Streptococcus pyogenes NOT DETECTED NOT DETECTED Final   Acinetobacter baumannii NOT DETECTED NOT DETECTED Final   Enterobacteriaceae species NOT DETECTED NOT DETECTED Final   Enterobacter cloacae complex NOT DETECTED NOT DETECTED Final   Escherichia coli NOT DETECTED NOT DETECTED Final   Klebsiella oxytoca NOT DETECTED NOT DETECTED Final   Klebsiella pneumoniae NOT DETECTED NOT DETECTED Final   Proteus species NOT DETECTED NOT DETECTED Final   Serratia marcescens NOT DETECTED NOT DETECTED Final   Haemophilus influenzae NOT DETECTED NOT DETECTED Final   Neisseria meningitidis NOT DETECTED NOT DETECTED Final   Pseudomonas aeruginosa NOT DETECTED NOT DETECTED Final   Candida albicans NOT DETECTED NOT DETECTED Final   Candida glabrata NOT DETECTED NOT DETECTED Final   Candida krusei NOT DETECTED NOT DETECTED Final   Candida parapsilosis NOT DETECTED NOT DETECTED Final   Candida tropicalis NOT DETECTED NOT DETECTED Final  MRSA PCR Screening     Status: None   Collection Time: 01/16/17  7:04 AM  Result Value Ref Range Status   MRSA by PCR NEGATIVE NEGATIVE Final    Comment:        The GeneXpert MRSA Assay (FDA approved for NASAL specimens only), is one component of a comprehensive MRSA colonization surveillance program. It is not intended to diagnose MRSA infection nor to guide or monitor treatment for MRSA infections.          Radiology Studies: No results found.      Scheduled Meds: . citalopram  20 mg Oral Daily  . [START ON 01/20/2017] levofloxacin  500 mg Oral  Q48H  . levothyroxine  88 mcg Oral QAC breakfast  . mouth rinse  15 mL Mouth Rinse BID  . predniSONE  30 mg Oral Q breakfast  . sodium bicarbonate  650 mg Oral BID  . sodium chloride flush  3 mL Intravenous Q12H  . warfarin  10 mg Oral ONCE-1800  . Warfarin - Pharmacist Dosing Inpatient   Does not apply q1800   Continuous Infusions: . sodium chloride    . heparin 650 Units/hr (01/19/17 0854)     LOS: 3 days    Sevanna Ballengee  Tanna Furry, MD [facility] Pager [phone]  If 7PM-7AM, please contact night-coverage [email or web address] Password [facility] 01/19/2017, 11:58 AM

## 2017-01-19 NOTE — Plan of Care (Signed)
Problem: Safety: Goal: Ability to remain free from injury will improve Outcome: Progressing  No falls or injuries this shift. Patient educated about fall prevention. Pt verbalized understanding.

## 2017-01-20 LAB — CBC
HEMATOCRIT: 28.7 % — AB (ref 36.0–46.0)
Hemoglobin: 9.4 g/dL — ABNORMAL LOW (ref 12.0–15.0)
MCH: 29.4 pg (ref 26.0–34.0)
MCHC: 32.8 g/dL (ref 30.0–36.0)
MCV: 89.7 fL (ref 78.0–100.0)
Platelets: 100 10*3/uL — ABNORMAL LOW (ref 150–400)
RBC: 3.2 MIL/uL — ABNORMAL LOW (ref 3.87–5.11)
RDW: 16.2 % — ABNORMAL HIGH (ref 11.5–15.5)
WBC: 7.2 10*3/uL (ref 4.0–10.5)

## 2017-01-20 LAB — PROTIME-INR
INR: 2.05
Prothrombin Time: 23.4 seconds — ABNORMAL HIGH (ref 11.4–15.2)

## 2017-01-20 LAB — HEPARIN LEVEL (UNFRACTIONATED): Heparin Unfractionated: 0.51 IU/mL (ref 0.30–0.70)

## 2017-01-20 MED ORDER — LEVOFLOXACIN 500 MG PO TABS: 500.0000 mg | ORAL_TABLET | Freq: Once | ORAL | Status: DC

## 2017-01-20 MED ORDER — WARFARIN SODIUM 5 MG PO TABS
5.0000 mg | ORAL_TABLET | Freq: Once | ORAL | Status: AC
  Administered 2017-01-20: 5 mg via ORAL
  Filled 2017-01-20: qty 1

## 2017-01-20 NOTE — NC FL2 (Signed)
[place] LEVEL OF CARE SCREENING TOOL     IDENTIFICATION  Patient Name: Rachael [name] Birthdate: [birth year] Sex: female Admission Date (Current Location): 01/16/2017  [facility] and [place] Number:  [employer] and Address:  [facility] [place], [place] [address] [place], [place] [address]      Provider Number: [id number]  Attending Physician Name and Address:  Rosita Fire, MD  Relative Name and Phone Number:  Elaina Hoops [phone]     Current Level of Care: Hospital Recommended Level of Care: [place] Prior Approval Number:    Date Approved/Denied:   PASRR Number: [id number]  Discharge Plan: SNF    Current Diagnoses: Patient Active Problem List   Diagnosis Date Noted  . Acute respiratory failure with hypoxia ([place])   . Pulmonary embolus ([place])   . Acute deep vein thrombosis (DVT) of both lower extremities (HCC)   . Sepsis ([place]) 01/16/2017  . CKD (chronic kidney disease), stage IV ([place]) 01/16/2017  . CHF exacerbation ([place]) 01/16/2017  . Elevated troponin 01/16/2017  . Paroxysmal atrial fibrillation ([place]) 10/30/2016  . Interstitial nephritis   . Hemodialysis catheter infection ([place])   . History of CVA (cerebrovascular accident) 10/24/2016  . Depression 10/24/2016  . Hyperlipidemia 10/24/2016  . Bacteremia 10/23/2016  . Chronic kidney disease (CKD), stage V ([place]) 10/23/2016  . Acute renal failure superimposed on stage 5 chronic kidney disease, not on chronic dialysis ([place]) 09/22/2016  . UTI (urinary tract infection) 09/22/2016  . Nausea and vomiting 09/22/2016  . HTN (hypertension) 09/22/2016  . Hypothyroidism 09/22/2016  . AKI (acute kidney injury) ([place]) 09/22/2016  . Pruritus 05/16/2015  . Urticaria 05/16/2015    Orientation RESPIRATION BLADDER Height & Weight     Self, Situation, Time, Place  O2 (nasal cannula 2L/min) Continent Weight: 256 lb 13.4 oz (116.5 kg) Height:  5\' 2"  (157.5 cm)   BEHAVIORAL SYMPTOMS/MOOD NEUROLOGICAL BOWEL NUTRITION STATUS      Continent Diet (please see DC summary)  AMBULATORY STATUS COMMUNICATION OF NEEDS Skin   Extensive Assist Verbally Bruising (arms)                       Personal Care Assistance Level of Assistance  Bathing, Feeding, Dressing Bathing Assistance: Maximum assistance Feeding assistance: Independent Dressing Assistance: Maximum assistance     Functional Limitations Info    Sight Info: Adequate Hearing Info: Adequate Speech Info: Adequate    SPECIAL CARE FACTORS FREQUENCY  PT (By licensed PT), OT (By licensed OT)     PT Frequency: 5x/week OT Frequency: 5x/week            Contractures      Additional Factors Info  Code Status, Allergies, Psychotropic Code Status Info: Full  Allergies Info: Proton Pump Inhibitors, Protonix Pantoprazole, Penicillins, Sulfa Antibiotics Psychotropic Info: citalopram         Current Medications (01/20/2017):  This is the current hospital active medication list Current Facility-Administered Medications  Medication Dose Route Frequency Provider Last Rate Last Dose  . 0.9 %  sodium chloride infusion  250 mL Intravenous PRN Fuller Plan A, MD      . acetaminophen (TYLENOL) tablet 650 mg  650 mg Oral Q4H PRN Smith, Rondell A, MD      . citalopram (CELEXA) tablet 20 mg  20 mg Oral Daily Smith, Rondell A, MD   20 mg at 01/20/17 1031  . famotidine (PEPCID) tablet 20 mg  20 mg Oral BID  PRN Fuller Plan A, MD      . heparin ADULT infusion 100 units/mL (25000 units/222mL sodium chloride 0.45%)  650 Units/hr Intravenous Continuous Rosita Fire, MD 6.5 mL/hr at 01/20/17 1512 650 Units/hr at 01/20/17 1512  . ipratropium-albuterol (DUONEB) 0.5-2.5 (3) MG/3ML nebulizer solution 3 mL  3 mL Nebulization Q4H PRN Smith, Rondell A, MD      . levothyroxine (SYNTHROID, LEVOTHROID) tablet 88 mcg  88 mcg Oral QAC breakfast Fuller Plan A, MD   88 mcg at 01/20/17 0842  . MEDLINE mouth  rinse  15 mL Mouth Rinse BID Rosita Fire, MD   15 mL at 01/20/17 1031  . metoCLOPramide (REGLAN) tablet 5 mg  5 mg Oral TID PRN Fuller Plan A, MD      . ondansetron (ZOFRAN) injection 4 mg  4 mg Intravenous Q6H PRN Smith, Rondell A, MD      . predniSONE (DELTASONE) tablet 30 mg  30 mg Oral Q breakfast Fuller Plan A, MD   30 mg at 01/20/17 0841  . sodium bicarbonate tablet 650 mg  650 mg Oral BID Rosita Fire, MD   650 mg at 01/20/17 1031  . sodium chloride flush (NS) 0.9 % injection 3 mL  3 mL Intravenous Q12H Smith, Rondell A, MD   3 mL at 01/20/17 1032  . sodium chloride flush (NS) 0.9 % injection 3 mL  3 mL Intravenous PRN Smith, Rondell A, MD      . warfarin (COUMADIN) tablet 5 mg  5 mg Oral ONCE-1800 Rosita Fire, MD      . Warfarin - Pharmacist Dosing Inpatient   Does not apply [id number] Rosita Fire, MD         Discharge Medications: Please see discharge summary for a list of discharge medications.  Relevant Imaging Results:  Relevant Lab Results:   Additional Information SSN: [id number]  Estanislado Emms, LCSW

## 2017-01-20 NOTE — Evaluation (Signed)
Occupational Therapy Evaluation Patient Details Name: Rachael [name] MRN: [id number] DOB: 1956-03-17 Today's Date: 01/20/2017    History of Present Illness 61 y.o. female with medical history significant of CHF last EF 60-65% in 10/2016, HTN, hypothyroidism, CVA w/o residual deficits; who presents with acute onset of shortness of breath. Patient was just admitted into the hospital with acute renal failure from 3/21-3/29.  Bilateral lower extremity Doppler consistent with extensive DVT bilateral.  The V/Q scan consistent with high probability for PE   Clinical Impression   This 61 yo female admitted with above presents to acute OT with deficits below (see OT problem list) thus affecting her PLOF to being totally independent with basic ADLs and IADLs. She will benefit from continued acute OT with follow up OT at SNF to get back to PLOF. Pt with symptomatic orthostatic BP--RN made aware.     Follow Up Recommendations  SNF;Supervision/Assistance - 24 hour    Equipment Recommendations  Other (comment) (TBD at next venue)       Precautions / Restrictions Precautions Precautions: Fall Restrictions Weight Bearing Restrictions: No      Mobility Bed Mobility Overal bed mobility: Needs Assistance Bed Mobility: Supine to Sit;Sit to Supine     Supine to sit: Mod assist;HOB elevated Sit to supine: Min assist   General bed mobility comments: lifting help to come up to sitting, guided feet in for to supine  Transfers Overall transfer level: Needs assistance Equipment used: Rolling walker (2 wheeled) Transfers: Sit to/from Stand Sit to Stand: +2 physical assistance         General transfer comment: stood briefly with assist, pt extremely orthostatic with symptoms of dizziness and nausea; BP after attempt to stand 62/48, returned to supine/trendelenberg and BP 73/50./    Balance Overall balance assessment: Needs assistance   Sitting balance-Leahy Scale: Fair       Standing  balance-Leahy Scale: Poor                             ADL either performed or assessed with clinical judgement   ADL Overall ADL's : Needs assistance/impaired Eating/Feeding: Independent;Sitting   Grooming: Set up;Supervision/safety;Bed level   Upper Body Bathing: Supervision/ safety;Bed level;Set up   Lower Body Bathing: Total assistance (min A sit<>stand)   Upper Body Dressing : Moderate assistance;Sitting   Lower Body Dressing: Total assistance (min A sit<>stand)                       Vision Patient Visual Report: No change from baseline              Pertinent Vitals/Pain Pain Assessment: 0-10 Pain Score: 0-No pain     Hand Dominance Right   Extremity/Trunk Assessment Upper Extremity Assessment Upper Extremity Assessment: Generalized weakness   Lower Extremity Assessment Lower Extremity Assessment: RLE deficits/detail;LLE deficits/detail RLE Deficits / Details: AROM WFL, strength hip flexion 3-/5, knee extension 3+/5, ankle DF 3+/5 LLE Deficits / Details: AROM WFL, strength hip flexion 3-/5, knee extension 3+/5, ankle DF 3+/5       Communication Communication Communication: No difficulties   Cognition Arousal/Alertness: Awake/alert Behavior During Therapy: Flat affect Overall Cognitive Status: Within Functional Limits for tasks assessed                                     General Comments  SpO2  86% when sitting on RA; O2 applied at 2LPM, SpO2 94%            Home Living Family/patient expects to be discharged to:: Skilled nursing facility Living Arrangements: Alone Available Help at Discharge: Available PRN/intermittently;Family (sister may be able to stay) Type of Home: Apartment Home Access: Stairs to enter Technical brewer of Steps: 1&1 Entrance Stairs-Rails: None Home Layout: One level     Bathroom Shower/Tub: Teacher, early years/pre: Handicapped height     Home Equipment: Environmental consultant - 2  wheels          Prior Functioning/Environment Level of Independence: Independent        Comments: two falls in 6 months when coming up to standing        OT Problem List: Decreased strength;Decreased range of motion;Decreased activity tolerance;Impaired balance (sitting and/or standing);Cardiopulmonary status limiting activity;Obesity;Decreased knowledge of use of DME or AE      OT Treatment/Interventions: Self-care/ADL training    OT Goals(Current goals can be found in the care plan section) Acute Rehab OT Goals Patient Stated Goal: To return to independent OT Goal Formulation: With patient Time For Goal Achievement: 02/03/17 Potential to Achieve Goals: Good  OT Frequency: Min 2X/week   Barriers to D/C: Decreased caregiver support          Co-evaluation PT/OT/SLP Co-Evaluation/Treatment: Yes Reason for Co-Treatment: For patient/therapist safety;Complexity of the patient's impairments (multi-system involvement) PT goals addressed during session: Mobility/safety with mobility;Strengthening/ROM OT goals addressed during session: ADL's and self-care;Strengthening/ROM      AM-PAC PT "6 Clicks" Daily Activity     Outcome Measure Help from another person eating meals?: None Help from another person taking care of personal grooming?: A Little Help from another person toileting, which includes using toliet, bedpan, or urinal?: A Lot Help from another person bathing (including washing, rinsing, drying)?: A Lot Help from another person to put on and taking off regular upper body clothing?: A Lot Help from another person to put on and taking off regular lower body clothing?: Total 6 Click Score: 14   End of Session Equipment Utilized During Treatment: Gait belt;Rolling walker Nurse Communication:  (issues with BP and pt symptomatic)  Activity Tolerance: Patient tolerated treatment well Patient left: in bed;with call bell/phone within reach  OT Visit Diagnosis: Unsteadiness  on feet (R26.81);Muscle weakness (generalized) (M62.81);Dizziness and giddiness (R42)                Time: 0518-3358 OT Time Calculation (min): 24 min Charges:  OT General Charges $OT Visit: 1 Procedure OT Evaluation $OT Eval Moderate Complexity: 1 Procedure [place], OTR/L [phone] 01/20/2017

## 2017-01-20 NOTE — Care Management Note (Addendum)
Case Management Note  Patient Details  Name: Phoua Hoadley Behrens MRN: [id number] Date of Birth: 1955/10/07  Subjective/Objective:       Presents with poss sepsis, acute resp failure, presumed chf, chronic kidney dz, hypothyroidism,   bil dvt, on heparin drip.  pt eval rec SNF, CSW referral. Also referral for coumadin for NCM.  Coumadin is $4 at [facility].  Patient will be going to SNF at discharge.                           Action/Plan: NCM will follow along with CSW for dc needs.  Expected Discharge Date:                  Expected Discharge Plan:  Skilled Nursing Facility  In-House Referral:  Clinical Social Work  Discharge planning Services  CM Consult  Post Acute Care Choice:    Choice offered to:     DME Arranged:    DME Agency:     HH Arranged:    [place] Agency:     Status of Service:  Completed, signed off  If discussed at [employer], dates discussed:    Additional Comments:  Zenon Mayo, RN 01/20/2017, 4:13 PM

## 2017-01-20 NOTE — Evaluation (Signed)
Physical Therapy Evaluation Patient Details Name: Rachael [name] MRN: [id number] DOB: 1956-06-15 Today's Date: 01/20/2017   History of Present Illness  61 y.o. female with medical history significant of CHF last EF 60-65% in 10/2016, HTN, hypothyroidism, CVA w/o residual deficits; who presents with acute onset of shortness of breath. Patient was just admitted into the hospital with acute renal failure from 3/21-3/29.  Bilateral lower extremity Doppler consistent with extensive DVT bilateral.  The V/Q scan consistent with high probability for PE  Clinical Impression  PAtient presents with decreased mobility due to weakness, postural hypotension, hypoxemia on room air and general debility.  Feel she will benefit from skilled PT In the acute setting and follow up SNF level rehab at d/c.     Follow Up Recommendations SNF    Equipment Recommendations  None recommended by PT    Recommendations for Other Services       Precautions / Restrictions Precautions Precautions: Fall Restrictions Weight Bearing Restrictions: No      Mobility  Bed Mobility Overal bed mobility: Needs Assistance Bed Mobility: Supine to Sit;Sit to Supine     Supine to sit: Mod assist;HOB elevated Sit to supine: Min assist   General bed mobility comments: lifting help to come up to sitting, guided feet in for to supine  Transfers Overall transfer level: Needs assistance Equipment used: Rolling walker (2 wheeled) Transfers: Sit to/from Stand Sit to Stand: +2 physical assistance         General transfer comment: stood briefly with assist, pt extremely orthostatic with symptoms of dizziness and nausea; BP after attempt to stand 62/48, returned to supine/trendelenberg and BP 73/50./  Ambulation/Gait             General Gait Details: unable due to orthostatic  Stairs            Wheelchair Mobility    Modified Rankin (Stroke Patients Only)       Balance Overall balance assessment: Needs  assistance   Sitting balance-Leahy Scale: Fair       Standing balance-Leahy Scale: Poor                               Pertinent Vitals/Pain Pain Assessment: 0-10 Pain Score: 0-No pain    Home Living Family/patient expects to be discharged to:: Skilled nursing facility Living Arrangements: Alone Available Help at Discharge: Available PRN/intermittently;Family (sister may be able to stay) Type of Home: Apartment Home Access: Stairs to enter Entrance Stairs-Rails: None Entrance Stairs-Number of Steps: 1&1 Home Layout: One level Home Equipment: Environmental consultant - 2 wheels      Prior Function Level of Independence: Independent         Comments: two falls in 6 months when coming up to standing     Hand Dominance   Dominant Hand: Right    Extremity/Trunk Assessment   Upper Extremity Assessment Upper Extremity Assessment: Defer to OT evaluation    Lower Extremity Assessment Lower Extremity Assessment: RLE deficits/detail;LLE deficits/detail RLE Deficits / Details: AROM WFL, strength hip flexion 3-/5, knee extension 3+/5, ankle DF 3+/5 LLE Deficits / Details: AROM WFL, strength hip flexion 3-/5, knee extension 3+/5, ankle DF 3+/5       Communication   Communication: No difficulties  Cognition Arousal/Alertness: Awake/alert Behavior During Therapy: Flat affect Overall Cognitive Status: Within Functional Limits for tasks assessed  General Comments General comments (skin integrity, edema, etc.): SpO2 86% when sitting on RA; O2 applied at 2LPM, SpO2 94%    Exercises     Assessment/Plan    PT Assessment Patient needs continued PT services  PT Problem List Decreased strength;Decreased balance;Decreased knowledge of use of DME;Decreased mobility;Decreased activity tolerance;Cardiopulmonary status limiting activity       PT Treatment Interventions DME instruction;Gait training;Therapeutic  exercise;Patient/family education;Therapeutic activities;Balance training;Functional mobility training    PT Goals (Current goals can be found in the Care Plan section)  Acute Rehab PT Goals Patient Stated Goal: To return to independent PT Goal Formulation: With patient Time For Goal Achievement: 01/27/17 Potential to Achieve Goals: Good    Frequency     Barriers to discharge        Co-evaluation PT/OT/SLP Co-Evaluation/Treatment: Yes Reason for Co-Treatment: For patient/therapist safety;Complexity of the patient's impairments (multi-system involvement) PT goals addressed during session: Mobility/safety with mobility;Strengthening/ROM         AM-PAC PT "6 Clicks" Daily Activity  Outcome Measure Difficulty turning over in bed (including adjusting bedclothes, sheets and blankets)?: A Little Difficulty moving from lying on back to sitting on the side of the bed? : Total Difficulty sitting down on and standing up from a chair with arms (e.g., wheelchair, bedside commode, etc,.)?: Total Help needed moving to and from a bed to chair (including a wheelchair)?: A Lot Help needed walking in hospital room?: Total Help needed climbing 3-5 steps with a railing? : Total 6 Click Score: 9    End of Session Equipment Utilized During Treatment: Gait belt;Oxygen Activity Tolerance: Treatment limited secondary to medical complications (Comment) (orthostatic) Patient left: with call bell/phone within reach;with bed alarm set Nurse Communication: Other (comment) (orthostatic) PT Visit Diagnosis: Muscle weakness (generalized) (M62.81);Other abnormalities of gait and mobility (R26.89);Difficulty in walking, not elsewhere classified (R26.2)    Time: 4536-4680 PT Time Calculation (min) (ACUTE ONLY): 24 min   Charges:   PT Evaluation $PT Eval Moderate Complexity: 1 Procedure     PT G CodesMagda Kiel, [place] 01/20/2017   Reginia Naas 01/20/2017, 10:39 AM

## 2017-01-20 NOTE — Progress Notes (Addendum)
PROGRESS NOTE    Rachael [name]  GUR:[id number] DOB: Sep 02, 1956 DOA: 01/16/2017 PCP: Ronita Hipps, MD   Brief Narrative: 61 y.o. female with medical history significant of CHF last EF 60-65% in 10/2016, HTN, hypothyroidism, CVA w/o residual deficits; who presents with acute onset of shortness of breath. Patient was just admitted into the hospital with acute renal failure from 3/21-3/29. In the ER patient was found to have hypoxia, possible sepsis. Chest x-ray clear. Also started on IV heparin for possible PE.  Assessment & Plan:   #Possible sepsis of unknown etiology or source (clinically undetermined): Blood culture 1 bottle growing coag-negative staph likely contaminant.  Sepsis parameters improved.  -Follow up final culture result.  -Received 4 days of IV vancomycin. MRSA PCR negative. Discontinued vancomycin on 5/14.  -Completing 5 days of Levaquin today.  #Acute respiratory failure with hypoxia likely due to PE: CT scan chest with contrast was not able to do because of renal failure. Bilateral lower extremity Doppler consistent with extensive DVT bilateral. Currently on IV heparin and bridge with Coumadin. INR 2.05. Plan to continue IV heparin and Coumadin to overlap for at least 5 days. Today is 4/5 of IV heparin. -Vascular consult appreciated. The V/Q scan consistent with high probability for PE. I discussed with Dr. Donzetta Matters from vascular surgery on 5/12. He recommended to continue anticoagulation for now and no need for IVC filter. -Requiring about 2 L of oxygen. Continue to monitor supportive care and bronchodilators. -echocardiogram with EF of 60-65%. Normal wall motion.  -We'll try to wean down oxygen slowly. Discussed with the nursing staff.  #Probable CHF exacerbation on admission which is less likely. Patient is euvolemic. Continue to monitor. Mild elevation in troponin likely in the setting of renal failure and possible PE. -Patient had TEE on 10/27/2016 showed EF of 60-65% with  normal wall motion.  #Likely chronic kidney disease is stage IV: On reviewing patient's records outpatient serum creatinine level is slowly trending down. Patient had a kidney biopsy proven AIN after PPI use. Currently on prednisone tapering dose.  continue to monitor serum creatinine level.Monitor BMP. Serum creatinine level is stable. -Start oral sodium bicarbonate.  #Hypotension during working with PT today: Patient got out from bed today first time with the help of PT. She felt dizzy associated with hypotension. She is not on antihypertensive medication. Echo reviewed. Clinically stable. Monitor blood pressure. No stocking for now because of extensive DVTs.[name]  #Hypothyroidism: Continue Synthroid.  #Thrombocytopenia likely in the setting of sepsis: Platelet counts improving. No sign of bleeding. monitor CBC  # morbid obesity  Continue current medical and supportive care. PT/OT eval ongoing. As per preliminary report from physical therapy, patient may need skilled nursing facility. Consult Education officer, museum.  Principal Problem:   Sepsis ([place]) Active Problems:   Hypothyroidism   History of CVA (cerebrovascular accident)   Depression   CKD (chronic kidney disease), stage IV (HCC)     Elevated troponin  DVT prophylaxis: IV heparin and Coumadin Code Status: Full code Family Communication: No family at bedside Disposition Plan: Likely discharge home in 1-2 days    Consultants:   Vascular surgery  Procedures: Vascular Doppler, echo Antimicrobials: Vancomycin 5/11-5/14  Levaquin since 5/11-5/15  Subjective: Patient was seen and examined at bedside. Denied headache, dizziness, nausea, vomiting, chest pain, shortness of breath, leg pain during examination and earlier morning. Later on nurse reported that the patient was complaining of dizziness while getting out from the bed  Objective: Vitals:   01/20/17 0856  01/20/17 0858 01/20/17 0859 01/20/17 0902  BP: (!) 91/39 (!) 62/48 (!)  73/50 (!) 94/57  Pulse:      Resp:      Temp:      TempSrc:      SpO2: 94% (!) 86%    Weight:      Height:        Intake/Output Summary (Last 24 hours) at 01/20/17 1028 Last data filed at 01/20/17 0600  Gross per 24 hour  Intake              613 ml  Output             1175 ml  Net             -562 ml   Filed Weights   01/17/17 0500 01/18/17 1012 01/20/17 0354  Weight: 114.6 kg (252 lb 10.4 oz) 114.2 kg (251 lb 12.3 oz) 116.5 kg (256 lb 13.4 oz)    Examination:  General exam: Lying on bed, not in distress  Respiratory system: Clear bilateral, respiratory effort normal  Cardiovascular system: Regular rate and rhythm, S1 and S2 normal,  Gastrointestinal system: Abdomen is nondistended, soft and nontender. Normal bowel sounds heard. Central nervous system: Alert, awake, oriented. Extremities: Symmetric 5 x 5 power. Skin: No rashes, lesions or ulcers Psychiatry: Judgement and insight appear normal. Mood & affect appropriate.     Data Reviewed: I have personally reviewed following labs and imaging studies  CBC:  Recent Labs Lab 01/16/17 0139 01/17/17 0211 01/18/17 0524 01/19/17 0327 01/20/17 0337  WBC 12.4* 9.5 7.3 7.0 7.2  NEUTROABS 10.2*  --   --   --   --   HGB 11.7* 10.3* 10.2* 9.6* 9.4*  HCT 36.2 30.8* 30.0* 27.8* 28.7*  MCV 91.6 91.1 90.4 88.8 89.7  PLT 149* 109* 92* 91* 782*   Basic Metabolic Panel:  Recent Labs Lab 01/16/17 0139 01/17/17 0211 01/18/17 0524 01/19/17 0327  NA 139 141 139 138  K 3.8 3.4* 3.3* 3.9  CL 108 106 105 103  CO2 18* 24 24 24   GLUCOSE 158* 101* 100* 125*  BUN 34* 32* 33* 34*  CREATININE 2.85* 2.67* 3.07* 2.76*  CALCIUM 9.3 8.9 8.9 8.8*   GFR: Estimated Creatinine Clearance: 25.6 mL/min (A) (by C-G formula based on SCr of 2.76 mg/dL (H)). Liver Function Tests: No results for input(s): AST, ALT, ALKPHOS, BILITOT, PROT, ALBUMIN in the last 168 hours. No results for input(s): LIPASE, AMYLASE in the last 168 hours. No  results for input(s): AMMONIA in the last 168 hours. Coagulation Profile:  Recent Labs Lab 01/18/17 0524 01/19/17 0327 01/20/17 0337  INR 1.37 1.34 2.05   Cardiac Enzymes:  Recent Labs Lab 01/16/17 0139 01/16/17 0555  TROPONINI 0.08* 0.08*   BNP (last 3 results) No results for input(s): PROBNP in the last 8760 hours. HbA1C: No results for input(s): HGBA1C in the last 72 hours. CBG: No results for input(s): GLUCAP in the last 168 hours. Lipid Profile: No results for input(s): CHOL, HDL, LDLCALC, TRIG, CHOLHDL, LDLDIRECT in the last 72 hours. Thyroid Function Tests: No results for input(s): TSH, T4TOTAL, FREET4, T3FREE, THYROIDAB in the last 72 hours. Anemia Panel: No results for input(s): VITAMINB12, FOLATE, FERRITIN, TIBC, IRON, RETICCTPCT in the last 72 hours. Sepsis Labs:  Recent Labs Lab 01/16/17 0555 01/16/17 0747 01/16/17 1112 01/17/17 0211  PROCALCITON <0.10  --   --   --   LATICACIDVEN 3.4* 2.9* 2.2* 1.1    Recent Results (  from the past 240 hour(s))  Blood Culture (routine x 2)     Status: None (Preliminary result)   Collection Time: 01/16/17  3:14 AM  Result Value Ref Range Status   Specimen Description BLOOD LEFT ANTECUBITAL  Final   Special Requests   Final    BOTTLES DRAWN AEROBIC AND ANAEROBIC Blood Culture adequate volume   Culture NO GROWTH 3 DAYS  Final   Report Status PENDING  Incomplete  Blood Culture (routine x 2)     Status: Abnormal   Collection Time: 01/16/17  3:22 AM  Result Value Ref Range Status   Specimen Description BLOOD RIGHT HAND  Final   Special Requests IN PEDIATRIC BOTTLE Blood Culture adequate volume  Final   Culture  Setup Time   Final    GRAM POSITIVE COCCI IN CLUSTERS IN PEDIATRIC BOTTLE CRITICAL RESULT CALLED TO, READ BACK BY AND VERIFIED WITH: A MASTERS,PHARMD AT 0908 01/17/17 BY L BENFIELD    Culture (A)  Final    STAPHYLOCOCCUS SPECIES (COAGULASE NEGATIVE) THE SIGNIFICANCE OF ISOLATING THIS ORGANISM FROM A SINGLE  SET OF BLOOD CULTURES WHEN MULTIPLE SETS ARE DRAWN IS UNCERTAIN. PLEASE NOTIFY THE MICROBIOLOGY DEPARTMENT WITHIN ONE WEEK IF SPECIATION AND SENSITIVITIES ARE REQUIRED.    Report Status 01/18/2017 FINAL  Final  Blood Culture ID Panel (Reflexed)     Status: Abnormal   Collection Time: 01/16/17  3:22 AM  Result Value Ref Range Status   Enterococcus species NOT DETECTED NOT DETECTED Final   Listeria monocytogenes NOT DETECTED NOT DETECTED Final   Staphylococcus species DETECTED (A) NOT DETECTED Final    Comment: Methicillin (oxacillin) susceptible coagulase negative staphylococcus. Possible blood culture contaminant (unless isolated from more than one blood culture draw or clinical case suggests pathogenicity). No antibiotic treatment is indicated for blood  culture contaminants. CRITICAL RESULT CALLED TO, READ BACK BY AND VERIFIED WITH: A MASTERS,PHARMD AT 0908 01/17/17 BY L BENFIELD    Staphylococcus aureus NOT DETECTED NOT DETECTED Final   Methicillin resistance NOT DETECTED NOT DETECTED Final   Streptococcus species NOT DETECTED NOT DETECTED Final   Streptococcus agalactiae NOT DETECTED NOT DETECTED Final   Streptococcus pneumoniae NOT DETECTED NOT DETECTED Final   Streptococcus pyogenes NOT DETECTED NOT DETECTED Final   Acinetobacter baumannii NOT DETECTED NOT DETECTED Final   Enterobacteriaceae species NOT DETECTED NOT DETECTED Final   Enterobacter cloacae complex NOT DETECTED NOT DETECTED Final   Escherichia coli NOT DETECTED NOT DETECTED Final   Klebsiella oxytoca NOT DETECTED NOT DETECTED Final   Klebsiella pneumoniae NOT DETECTED NOT DETECTED Final   Proteus species NOT DETECTED NOT DETECTED Final   Serratia marcescens NOT DETECTED NOT DETECTED Final   Haemophilus influenzae NOT DETECTED NOT DETECTED Final   Neisseria meningitidis NOT DETECTED NOT DETECTED Final   Pseudomonas aeruginosa NOT DETECTED NOT DETECTED Final   Candida albicans NOT DETECTED NOT DETECTED Final   Candida  glabrata NOT DETECTED NOT DETECTED Final   Candida krusei NOT DETECTED NOT DETECTED Final   Candida parapsilosis NOT DETECTED NOT DETECTED Final   Candida tropicalis NOT DETECTED NOT DETECTED Final  MRSA PCR Screening     Status: None   Collection Time: 01/16/17  7:04 AM  Result Value Ref Range Status   MRSA by PCR NEGATIVE NEGATIVE Final    Comment:        The GeneXpert MRSA Assay (FDA approved for NASAL specimens only), is one component of a comprehensive MRSA colonization surveillance program. It is not intended to diagnose  MRSA infection nor to guide or monitor treatment for MRSA infections.          Radiology Studies: No results found.      Scheduled Meds: . citalopram  20 mg Oral Daily  . levofloxacin  500 mg Oral Q48H  . levothyroxine  88 mcg Oral QAC breakfast  . mouth rinse  15 mL Mouth Rinse BID  . predniSONE  30 mg Oral Q breakfast  . sodium bicarbonate  650 mg Oral BID  . sodium chloride flush  3 mL Intravenous Q12H  . warfarin  5 mg Oral ONCE-1800  . Warfarin - Pharmacist Dosing Inpatient   Does not apply q1800   Continuous Infusions: . sodium chloride    . heparin Stopped (01/20/17 0848)     LOS: 4 days    Bryssa Tones Tanna Furry, MD [facility] Pager [phone]  If 7PM-7AM, please contact night-coverage [email or web address] Password [facility] 01/20/2017, 10:28 AM

## 2017-01-20 NOTE — Progress Notes (Signed)
ANTICOAGULATION CONSULT NOTE - Follow Up Consult  Pharmacy Consult for Heparin and warfarin  Indication: Extensive bilateral DVTs, PE   Allergies  Allergen Reactions  . Proton Pump Inhibitors     Biopsy proven acute interstitial nephritis; required dialysis in 09/2016  . Protonix [Pantoprazole] Anaphylaxis  . Penicillins Rash and Other (See Comments)    Tolerates rocephin  . Sulfa Antibiotics Rash   Patient Measurements: Height: 5\' 2"  (157.5 cm) Weight: 256 lb 13.4 oz (116.5 kg) IBW/kg (Calculated) : 50.1  Vital Signs: Temp: 98.2 F (36.8 C) (05/15 0732) Temp Source: Oral (05/15 0732) BP: 94/57 (05/15 0902) Pulse Rate: 97 (05/15 0732)  Labs:  Recent Labs  01/18/17 0524 01/19/17 0327 01/20/17 0337  HGB 10.2* 9.6* 9.4*  HCT 30.0* 27.8* 28.7*  PLT 92* 91* 100*  LABPROT 17.0* 16.7* 23.4*  INR 1.37 1.34 2.05  HEPARINUNFRC 0.46 0.34 0.51  CREATININE 3.07* 2.76*  --     Estimated Creatinine Clearance: 25.6 mL/min (A) (by C-G formula based on SCr of 2.76 mg/dL (H)).  Assessment: On warfarin with heparin bridge for extensive bilateral DVTs and high probability for PE per VQ scan. Heparin level remains therapeutic this am. INR trended up briskly to 2.05 < 1.34. CBC remains stable with no reported bleeding. Today is day 4 of heparin and warfarin overlap.    Goal of Therapy:  INR 2-3 Heparin level 0.3-0.7 units/ml Monitor platelets by anticoagulation protocol: Yes   Plan:  - Warfarin 5 mg PO x1 tonight - Continue heparin infusion at 650 units/hr - Daily HL and CBC - Monitor pltc and s/sx of bleeding - Will need 5 days of heparin and warfarin overlap and until the INR>2.0 x2 days   Vincenza Hews, PharmD, BCPS 01/20/2017, 9:48 AM

## 2017-01-20 NOTE — Care Management Important Message (Signed)
Important Message  Patient Details  Name: Rachael [name] MRN: [id number] Date of Birth: 1955-10-27   Medicare Important Message Given:  Yes    Nathen May 01/20/2017, 11:27 AM

## 2017-01-21 DIAGNOSIS — R0602 Shortness of breath: Secondary | ICD-10-CM

## 2017-01-21 DIAGNOSIS — I951 Orthostatic hypotension: Secondary | ICD-10-CM

## 2017-01-21 DIAGNOSIS — I959 Hypotension, unspecified: Secondary | ICD-10-CM

## 2017-01-21 DIAGNOSIS — J189 Pneumonia, unspecified organism: Secondary | ICD-10-CM

## 2017-01-21 DIAGNOSIS — R791 Abnormal coagulation profile: Secondary | ICD-10-CM

## 2017-01-21 HISTORY — DX: Hypotension, unspecified: I95.9

## 2017-01-21 LAB — BASIC METABOLIC PANEL
ANION GAP: 10 (ref 5–15)
BUN: 37 mg/dL — AB (ref 6–20)
CO2: 24 mmol/L (ref 22–32)
Calcium: 9 mg/dL (ref 8.9–10.3)
Chloride: 103 mmol/L (ref 101–111)
Creatinine, Ser: 2.76 mg/dL — ABNORMAL HIGH (ref 0.44–1.00)
GFR calc Af Amer: 20 mL/min — ABNORMAL LOW (ref >60)
GFR calc non Af Amer: 17 mL/min — ABNORMAL LOW (ref >60)
GLUCOSE: 98 mg/dL (ref 65–99)
Potassium: 3.9 mmol/L (ref 3.5–5.1)
Sodium: 137 mmol/L (ref 135–145)

## 2017-01-21 LAB — CULTURE, BLOOD (ROUTINE X 2)
CULTURE: NO GROWTH
SPECIAL REQUESTS: ADEQUATE

## 2017-01-21 LAB — CBC
HEMATOCRIT: 29.3 % — AB (ref 36.0–46.0)
HEMOGLOBIN: 9.8 g/dL — AB (ref 12.0–15.0)
MCH: 30.4 pg (ref 26.0–34.0)
MCHC: 33.4 g/dL (ref 30.0–36.0)
MCV: 91 fL (ref 78.0–100.0)
Platelets: 95 10*3/uL — ABNORMAL LOW (ref 150–400)
RBC: 3.22 MIL/uL — ABNORMAL LOW (ref 3.87–5.11)
RDW: 16.6 % — ABNORMAL HIGH (ref 11.5–15.5)
WBC: 7.2 10*3/uL (ref 4.0–10.5)

## 2017-01-21 LAB — PROTIME-INR
INR: 2.6
Prothrombin Time: 28.3 seconds — ABNORMAL HIGH (ref 11.4–15.2)

## 2017-01-21 LAB — HEPARIN LEVEL (UNFRACTIONATED): Heparin Unfractionated: 0.51 IU/mL (ref 0.30–0.70)

## 2017-01-21 MED ORDER — SODIUM CHLORIDE 0.9 % IV BOLUS (SEPSIS)
1000.0000 mL | Freq: Once | INTRAVENOUS | Status: AC
  Administered 2017-01-21: 1000 mL via INTRAVENOUS

## 2017-01-21 MED ORDER — WARFARIN SODIUM 5 MG PO TABS
5.0000 mg | ORAL_TABLET | Freq: Once | ORAL | Status: AC
  Administered 2017-01-21: 5 mg via ORAL
  Filled 2017-01-21: qty 1

## 2017-01-21 NOTE — Plan of Care (Signed)
Problem: Physical Regulation: Goal: Ability to maintain clinical measurements within normal limits will improve Outcome: Progressing PT reported that the pt had some marked hypotension during physical therapy session, however no hypotension is noted otherwise.

## 2017-01-21 NOTE — Progress Notes (Signed)
ANTICOAGULATION CONSULT NOTE - Follow Up Consult  Pharmacy Consult for Heparin and warfarin  Indication: Extensive bilateral DVTs, PE   Allergies  Allergen Reactions  . Proton Pump Inhibitors     Biopsy proven acute interstitial nephritis; required dialysis in 09/2016  . Protonix [Pantoprazole] Anaphylaxis  . Penicillins Rash and Other (See Comments)    Tolerates rocephin  . Sulfa Antibiotics Rash   Patient Measurements: Height: 5\' 2"  (157.5 cm) Weight: 255 lb 15.3 oz (116.1 kg) IBW/kg (Calculated) : 50.1  Vital Signs: Temp: 97.9 F (36.6 C) (05/16 0800) Temp Source: Oral (05/16 0800) BP: 73/60 (05/16 0800) Pulse Rate: 96 (05/16 0800)  Labs:  Recent Labs  01/19/17 0327 01/20/17 0337 01/21/17 0332  HGB 9.6* 9.4* 9.8*  HCT 27.8* 28.7* 29.3*  PLT 91* 100* 95*  LABPROT 16.7* 23.4* 28.3*  INR 1.34 2.05 2.60  HEPARINUNFRC 0.34 0.51 0.51  CREATININE 2.76*  --   --     Estimated Creatinine Clearance: 25.5 mL/min (A) (by C-G formula based on SCr of 2.76 mg/dL (H)).  Assessment: On warfarin with heparin bridge for extensive bilateral DVTs and high probability for PE per VQ scan. Heparin level remains therapeutic this am. INR therapeutic x 2. Today is day 5 of overlap with heparin and warfarin. CBC stable.    Goal of Therapy:  INR 2-3 Heparin level 0.3-0.7 units/ml Monitor platelets by anticoagulation protocol: Yes   Plan:  - Warfarin 5 mg PO x1 tonight - Continue heparin infusion at 650 units/hr - Daily HL and CBC - Monitor pltc and s/sx of bleeding - Will plan to stop heparin after warfarin dose received dose this evening   Vincenza Hews, PharmD, BCPS 01/21/2017, 9:11 AM

## 2017-01-21 NOTE — Clinical Social Work Note (Signed)
Clinical Social Work Assessment  Patient Details  Name: Rachael [name] MRN: [id number] Date of Birth: September 07, 1956  Date of referral:  01/20/17               Reason for consult:  Facility Placement                Permission sought to share information with:  Facility Art therapist granted to share information::  Yes, Verbal Permission Granted  Name::        Agency::  SNFs  Relationship::     Contact Information:     Housing/Transportation Living arrangements for the past 2 months:  Apartment Source of Information:  Patient Patient Interpreter Needed:  None Criminal Activity/Legal Involvement Pertinent to Current Situation/Hospitalization:    Significant Relationships:  Adult Children Lives with:  Self Do you feel safe going back to the place where you live?  No Need for family participation in patient care:  No (Coment)  Care giving concerns:  Patient lives at home alone- unable to return home independently with current impairment.   Social Worker assessment / plan:  CSW spoke with pt about PT recommendation for SNF.  CSW explained SNF and SNF referral process.  Employment status:  Retired Nurse, adult PT Recommendations:  [place] / Referral to community resources:  [place]  Patient/Family's Response to care:  Pt acknowledges that she would be unsafe to take care of herself with current impairment- is agreeable to SNF placement when medically cleared.  Patient/Family's Understanding of and Emotional Response to Diagnosis, Current Treatment, and Prognosis:  Patient has good understanding of current condition- pt is hopeful that she will return to living independently with short rehab stay.  Emotional Assessment Appearance:  Appears stated age Attitude/Demeanor/Rapport:    Affect (typically observed):  Appropriate, Pleasant Orientation:  Oriented to Self, Oriented to Place, Oriented to   Time, Oriented to Situation Alcohol / Substance use:  Not Applicable Psych involvement (Current and /or in the community):  No (Comment)  Discharge Needs  Concerns to be addressed:  Care Coordination Readmission within the last 30 days:  No Current discharge risk:  Physical Impairment Barriers to Discharge:  Continued Medical Work up   Jorge Ny, LCSW 01/21/2017, 9:25 AM

## 2017-01-21 NOTE — Progress Notes (Addendum)
Patient given bed offers- no choice at this time- hopeful for [place] facility  CSW will continue to follow  Jorge Ny, [place] Social Worker [phone]

## 2017-01-21 NOTE — Progress Notes (Signed)
PROGRESS NOTE    Rachael [name]  LKG:[id number] DOB: 1956/06/07 DOA: 01/16/2017 PCP: Ronita Hipps, MD    Brief Narrative:  61 y.o.femalewith medical history significant of CHF last EF 60-65% in 10/2016, HTN, hypothyroidism, CVA w/o residual deficits; who presents with acute onset of shortness of breath. Patient was just admitted into the hospital with acute renal failure from 3/21-3/29. In the ER patient was found to have hypoxia, possible sepsis. Chest x-ray clear. Also started on IV heparin for possible PE.    Assessment & Plan:   Principal Problem:   Sepsis ([place]) Active Problems:   Acute respiratory failure with hypoxia (HCC)   Pulmonary embolus (HCC)   Hypotension   Acute deep vein thrombosis (DVT) of both lower extremities (HCC)   Hypothyroidism   History of CVA (cerebrovascular accident)   Depression   CKD (chronic kidney disease), stage IV (HCC)   CHF exacerbation (HCC)   Elevated troponin  #1 sepsis Concern for sepsis of unknown etiology. Blood cultures 1 bottle growing coag-negative staph likely a contaminant. Patient has improved clinically in terms of sepsis criteria. Patient currently afebrile. Normal white count. Patient status post 4 days of IV vancomycin. Vancomycin discontinued on 01/19/2017. Patient status post 5 days of Levaquin. Monitor for now.  #2 acute respiratory failure with hypoxia likely secondary to PE Patient had presented with shortness of breath. Unable to get a CT scan of the chest with contrast due to patient's chronic kidney disease. Lower extremity Dopplers consistent with extensive bilateral DVT. VQ scan with high probability for PE. Patient currently on IV heparin bridge and Coumadin with goal INR 2-3. Today's day 5 out of 5 IV heparin overlap with Coumadin. Patient currently on 2 L nasal cannula with sats of 96%. Patient noted to be hypotensive yesterday when she got up to ambulate with physical therapy. Patient with bouts of hypotension last  night and early this morning with a blood pressure 73/60 with repeat systolic of 664. Concern that hypotension likely secondary to PE. 2-D echo with dilated right ventricle with normal EF. Patient status post course of antibiotic treatment. Patient seen in consultation by vascular surgery, Dr. Donzetta Matters who had recommended to continue anticoagulation for now no need for IVC filter. Will have PCCM see patient in consultation for further evaluation due to hypotension.   #3 hypotension Concern for hemodynamic instability likely secondary to PE. VQ scan with hypermobility of PE. Patient with bilateral DVTs currently on IV heparin and Coumadin. Consult with critical care medicine for further evaluation and management. 1 L bolus normal saline 1.  #4 bilateral lower extremity DVT/PE Noted on bilateral lower extremity Dopplers. Patient had presented with shortness of breath. VQ scan with high probability of PE. Patient currently on IV heparin bridge with Coumadin. Day 5 out of 5 heparin bridge. Goal INR 2-3. Patient has been seen in consultation by vascular surgery, Dr.Cain who recommended anticoagulation and no need for IVC filter at this time.  #5 probable chronic kidney disease stage IV Patient with serum creatinine levels slowly trending down. Patient status post kidney biopsy proven AIN after PPI use. Patient on a prednisone tapering dose. Follow renal function. Continue bicarbonate tablets. Outpatient follow-up.  #6 hypothyroidism TSH 04.120 on 01/16/2017. Continue home dose Synthroid.  #7 morbid obesity  #8 thrombocytopenia Likely in the setting of sepsis. No signs of bleeding. Platelets stable. Follow.   DVT prophylaxis: Heparin/Coumadin Code Status: Full Family Communication: Updated patient. No family at bedside. Disposition Plan: Remain in step down  unit. Patient with bouts of hypotension. Likely skilled nursing facility once hypotension has resolved and clinical improvement.   Consultants:     Vascular surgery: Dr. Donzetta Matters 01/16/2017  Procedures:  Lower extremity Dopplers 01/16/2017  Chest x-ray 01/16/2017  VQ scan 01/16/2017  2-D echo 01/18/2017  IVC/iliac duplex 01/16/2017  Antimicrobials:   IV vancomycin 01/16/2017>>>> 01/19/2017  Levaquin 01/16/2017>>>> 01/20/2017   Subjective: Patient sitting up in bed eating breakfast. Patient states shortness of breath slowly improving. Patient complaining of dizziness and lightheadedness whenever she stands or moves around. Patient noted to be hypotensive overnight with blood pressure this morning of 73/60 with repeat of 233 systolic. Patient denies any bleeding.  Objective: Vitals:   01/20/17 2355 01/21/17 0255 01/21/17 0500 01/21/17 0800  BP: 96/60 131/70  (!) 73/60  Pulse: 88 (!) 104  96  Resp: 15 18  15   Temp: 98.4 F (36.9 C) 98.2 F (36.8 C)  97.9 F (36.6 C)  TempSrc: Oral Oral  Oral  SpO2: 96% 90%  95%  Weight:   116.1 kg (255 lb 15.3 oz)   Height:        Intake/Output Summary (Last 24 hours) at 01/21/17 0959 Last data filed at 01/21/17 0600  Gross per 24 hour  Intake           316.25 ml  Output              625 ml  Net          -308.75 ml   Filed Weights   01/18/17 1012 01/20/17 0354 01/21/17 0500  Weight: 114.2 kg (251 lb 12.3 oz) 116.5 kg (256 lb 13.4 oz) 116.1 kg (255 lb 15.3 oz)    Examination:  General exam: Appears calm and comfortable  Respiratory system: Clear to auscultation. Respiratory effort normal. Cardiovascular system: S1 & S2 heard, TACHYCARDIA. No JVD, murmurs, rubs, gallops or clicks. No pedal edema. Gastrointestinal system: Abdomen is nondistended, soft and nontender. No organomegaly or masses felt. Normal bowel sounds heard. Central nervous system: Alert and oriented. No focal neurological deficits. Extremities: Symmetric 5 x 5 power. Skin: No rashes, lesions or ulcers Psychiatry: Judgement and insight appear normal. Mood & affect appropriate.     Data Reviewed: I have  personally reviewed following labs and imaging studies  CBC:  Recent Labs Lab 01/16/17 0139 01/17/17 0211 01/18/17 0524 01/19/17 0327 01/20/17 0337 01/21/17 0332  WBC 12.4* 9.5 7.3 7.0 7.2 7.2  NEUTROABS 10.2*  --   --   --   --   --   HGB 11.7* 10.3* 10.2* 9.6* 9.4* 9.8*  HCT 36.2 30.8* 30.0* 27.8* 28.7* 29.3*  MCV 91.6 91.1 90.4 88.8 89.7 91.0  PLT 149* 109* 92* 91* 100* 95*   Basic Metabolic Panel:  Recent Labs Lab 01/16/17 0139 01/17/17 0211 01/18/17 0524 01/19/17 0327  NA 139 141 139 138  K 3.8 3.4* 3.3* 3.9  CL 108 106 105 103  CO2 18* 24 24 24   GLUCOSE 158* 101* 100* 125*  BUN 34* 32* 33* 34*  CREATININE 2.85* 2.67* 3.07* 2.76*  CALCIUM 9.3 8.9 8.9 8.8*   GFR: Estimated Creatinine Clearance: 25.5 mL/min (A) (by C-G formula based on SCr of 2.76 mg/dL (H)). Liver Function Tests: No results for input(s): AST, ALT, ALKPHOS, BILITOT, PROT, ALBUMIN in the last 168 hours. No results for input(s): LIPASE, AMYLASE in the last 168 hours. No results for input(s): AMMONIA in the last 168 hours. Coagulation Profile:  Recent Labs Lab 01/18/17 0524 01/19/17  0327 01/20/17 0337 01/21/17 0332  INR 1.37 1.34 2.05 2.60   Cardiac Enzymes:  Recent Labs Lab 01/16/17 0139 01/16/17 0555  TROPONINI 0.08* 0.08*   BNP (last 3 results) No results for input(s): PROBNP in the last 8760 hours. HbA1C: No results for input(s): HGBA1C in the last 72 hours. CBG: No results for input(s): GLUCAP in the last 168 hours. Lipid Profile: No results for input(s): CHOL, HDL, LDLCALC, TRIG, CHOLHDL, LDLDIRECT in the last 72 hours. Thyroid Function Tests: No results for input(s): TSH, T4TOTAL, FREET4, T3FREE, THYROIDAB in the last 72 hours. Anemia Panel: No results for input(s): VITAMINB12, FOLATE, FERRITIN, TIBC, IRON, RETICCTPCT in the last 72 hours. Sepsis Labs:  Recent Labs Lab 01/16/17 0555 01/16/17 0747 01/16/17 1112 01/17/17 0211  PROCALCITON <0.10  --   --   --     LATICACIDVEN 3.4* 2.9* 2.2* 1.1    Recent Results (from the past 240 hour(s))  Blood Culture (routine x 2)     Status: None (Preliminary result)   Collection Time: 01/16/17  3:14 AM  Result Value Ref Range Status   Specimen Description BLOOD LEFT ANTECUBITAL  Final   Special Requests   Final    BOTTLES DRAWN AEROBIC AND ANAEROBIC Blood Culture adequate volume   Culture NO GROWTH 4 DAYS  Final   Report Status PENDING  Incomplete  Blood Culture (routine x 2)     Status: Abnormal   Collection Time: 01/16/17  3:22 AM  Result Value Ref Range Status   Specimen Description BLOOD RIGHT HAND  Final   Special Requests IN PEDIATRIC BOTTLE Blood Culture adequate volume  Final   Culture  Setup Time   Final    GRAM POSITIVE COCCI IN CLUSTERS IN PEDIATRIC BOTTLE CRITICAL RESULT CALLED TO, READ BACK BY AND VERIFIED WITH: A MASTERS,PHARMD AT 0908 01/17/17 BY L BENFIELD    Culture (A)  Final    STAPHYLOCOCCUS SPECIES (COAGULASE NEGATIVE) THE SIGNIFICANCE OF ISOLATING THIS ORGANISM FROM A SINGLE SET OF BLOOD CULTURES WHEN MULTIPLE SETS ARE DRAWN IS UNCERTAIN. PLEASE NOTIFY THE MICROBIOLOGY DEPARTMENT WITHIN ONE WEEK IF SPECIATION AND SENSITIVITIES ARE REQUIRED.    Report Status 01/18/2017 FINAL  Final  Blood Culture ID Panel (Reflexed)     Status: Abnormal   Collection Time: 01/16/17  3:22 AM  Result Value Ref Range Status   Enterococcus species NOT DETECTED NOT DETECTED Final   Listeria monocytogenes NOT DETECTED NOT DETECTED Final   Staphylococcus species DETECTED (A) NOT DETECTED Final    Comment: Methicillin (oxacillin) susceptible coagulase negative staphylococcus. Possible blood culture contaminant (unless isolated from more than one blood culture draw or clinical case suggests pathogenicity). No antibiotic treatment is indicated for blood  culture contaminants. CRITICAL RESULT CALLED TO, READ BACK BY AND VERIFIED WITH: A MASTERS,PHARMD AT 0908 01/17/17 BY L BENFIELD    Staphylococcus aureus  NOT DETECTED NOT DETECTED Final   Methicillin resistance NOT DETECTED NOT DETECTED Final   Streptococcus species NOT DETECTED NOT DETECTED Final   Streptococcus agalactiae NOT DETECTED NOT DETECTED Final   Streptococcus pneumoniae NOT DETECTED NOT DETECTED Final   Streptococcus pyogenes NOT DETECTED NOT DETECTED Final   Acinetobacter baumannii NOT DETECTED NOT DETECTED Final   Enterobacteriaceae species NOT DETECTED NOT DETECTED Final   Enterobacter cloacae complex NOT DETECTED NOT DETECTED Final   Escherichia coli NOT DETECTED NOT DETECTED Final   Klebsiella oxytoca NOT DETECTED NOT DETECTED Final   Klebsiella pneumoniae NOT DETECTED NOT DETECTED Final   Proteus species NOT  DETECTED NOT DETECTED Final   Serratia marcescens NOT DETECTED NOT DETECTED Final   Haemophilus influenzae NOT DETECTED NOT DETECTED Final   Neisseria meningitidis NOT DETECTED NOT DETECTED Final   Pseudomonas aeruginosa NOT DETECTED NOT DETECTED Final   Candida albicans NOT DETECTED NOT DETECTED Final   Candida glabrata NOT DETECTED NOT DETECTED Final   Candida krusei NOT DETECTED NOT DETECTED Final   Candida parapsilosis NOT DETECTED NOT DETECTED Final   Candida tropicalis NOT DETECTED NOT DETECTED Final  MRSA PCR Screening     Status: None   Collection Time: 01/16/17  7:04 AM  Result Value Ref Range Status   MRSA by PCR NEGATIVE NEGATIVE Final    Comment:        The GeneXpert MRSA Assay (FDA approved for NASAL specimens only), is one component of a comprehensive MRSA colonization surveillance program. It is not intended to diagnose MRSA infection nor to guide or monitor treatment for MRSA infections.          Radiology Studies: No results found.      Scheduled Meds: . citalopram  20 mg Oral Daily  . levothyroxine  88 mcg Oral QAC breakfast  . mouth rinse  15 mL Mouth Rinse BID  . predniSONE  30 mg Oral Q breakfast  . sodium bicarbonate  650 mg Oral BID  . sodium chloride flush  3 mL  Intravenous Q12H  . warfarin  5 mg Oral ONCE-1800  . Warfarin - Pharmacist Dosing Inpatient   Does not apply q1800   Continuous Infusions: . sodium chloride    . heparin 650 Units/hr (01/20/17 1512)  . sodium chloride       LOS: 5 days    Time spent: [place], MD [facility] Pager [phone]  If 7PM-7AM, please contact night-coverage [email or web address] Password [email or web address] 01/21/2017, 9:59 AM

## 2017-01-22 LAB — BASIC METABOLIC PANEL
ANION GAP: 8 (ref 5–15)
BUN: 38 mg/dL — ABNORMAL HIGH (ref 6–20)
CALCIUM: 8.8 mg/dL — AB (ref 8.9–10.3)
CO2: 24 mmol/L (ref 22–32)
Chloride: 105 mmol/L (ref 101–111)
Creatinine, Ser: 2.8 mg/dL — ABNORMAL HIGH (ref 0.44–1.00)
GFR, EST AFRICAN AMERICAN: 20 mL/min — AB (ref >60)
GFR, EST NON AFRICAN AMERICAN: 17 mL/min — AB (ref >60)
GLUCOSE: 121 mg/dL — AB (ref 65–99)
POTASSIUM: 3.9 mmol/L (ref 3.5–5.1)
Sodium: 137 mmol/L (ref 135–145)

## 2017-01-22 LAB — CBC
HCT: 28.6 % — ABNORMAL LOW (ref 36.0–46.0)
Hemoglobin: 9.3 g/dL — ABNORMAL LOW (ref 12.0–15.0)
MCH: 29.8 pg (ref 26.0–34.0)
MCHC: 32.5 g/dL (ref 30.0–36.0)
MCV: 91.7 fL (ref 78.0–100.0)
PLATELETS: 104 10*3/uL — AB (ref 150–400)
RBC: 3.12 MIL/uL — AB (ref 3.87–5.11)
RDW: 17 % — AB (ref 11.5–15.5)
WBC: 6.4 10*3/uL (ref 4.0–10.5)

## 2017-01-22 LAB — PROTIME-INR
INR: 2.95
Prothrombin Time: 31.4 seconds — ABNORMAL HIGH (ref 11.4–15.2)

## 2017-01-22 MED ORDER — WARFARIN SODIUM 5 MG PO TABS
5.0000 mg | ORAL_TABLET | Freq: Once | ORAL | Status: AC
  Administered 2017-01-22: 5 mg via ORAL
  Filled 2017-01-22: qty 1

## 2017-01-22 MED ORDER — SODIUM CHLORIDE 0.9 % IV SOLN
INTRAVENOUS | Status: DC
  Administered 2017-01-22 – 2017-01-24 (×4): via INTRAVENOUS

## 2017-01-22 NOTE — Progress Notes (Signed)
PROGRESS NOTE    Rachael [name]  LGX:[id number] DOB: 1956/02/09 DOA: 01/16/2017 PCP: Ronita Hipps, MD    Brief Narrative:  61 y.o.femalewith medical history significant of CHF last EF 60-65% in 10/2016, HTN, hypothyroidism, CVA w/o residual deficits; who presents with acute onset of shortness of breath. Patient was just admitted into the hospital with acute renal failure from 3/21-3/29. In the ER patient was found to have hypoxia, possible sepsis. Chest x-ray clear. Also started on IV heparin for possible PE.    Assessment & Plan:   Principal Problem:   Sepsis ([place]) Active Problems:   Acute respiratory failure with hypoxia (HCC)   Pulmonary embolus (HCC)   Hypotension   Acute deep vein thrombosis (DVT) of both lower extremities (HCC)   Hypothyroidism   History of CVA (cerebrovascular accident)   Depression   CKD (chronic kidney disease), stage IV (HCC)   CHF exacerbation (HCC)   Elevated troponin  #1 sepsis Concern for sepsis of unknown etiology. Blood cultures 1 bottle growing coag-negative staph likely a contaminant. Patient has improved clinically in terms of sepsis criteria. Patient currently afebrile. Normal white count. Patient status post 4 days of IV vancomycin. Vancomycin discontinued on 01/19/2017. Patient status post 5 days of Levaquin. Follow.  #2 acute respiratory failure with hypoxia likely secondary to PE Patient had presented with shortness of breath. Unable to get a CT scan of the chest with contrast due to patient's chronic kidney disease. Lower extremity Dopplers consistent with extensive bilateral DVT. VQ scan with high probability for PE. Patient s/p IV heparin bridge and Coumadin with goal INR 2-3. INR is currently 2.95. IV heparin has been discontinued. Currently on Coumadin. Patient currently on 2 L nasal cannula with sats of 97%. Patient noted to be hypotensive on 5/15 and 5/16 and when she got up to ambulate with physical therapy. Patient given a bolus 1 L  normal saline with improvement with blood pressure.Initial concern that hypotension likely secondary to PE. 2-D echo with dilated right ventricle with normal EF. Patient status post course of antibiotic treatment. Patient seen in consultation by vascular surgery, Dr. Donzetta Matters who had recommended to continue anticoagulation for now no need for IVC filter. Follow closely.  #3 hypotension Concern for hemodynamic instability likely secondary to PE. VQ scan with hypermobility of PE. Patient with bilateral DVTs s/p IV heparin bridge with Coumadin. Blood pressure improving. Check orthostatics. Place on gentle hydration with saline 75 mL per hour for the next 24 hours.   #4 bilateral lower extremity DVT/PE Noted on bilateral lower extremity Dopplers. Patient had presented with shortness of breath. VQ scan with high probability of PE. Patient currently s/p IV heparin bridge with Coumadin. Heparin has been discontinued. INR currently at 2.95. Goal INR 2-3. Patient has been seen in consultation by vascular surgery, Dr.Cain who recommended anticoagulation and no need for IVC filter at this time.  #5 probable chronic kidney disease stage IV Patient with serum creatinine levels since stabilized.  Patient status post kidney biopsy proven AIN after PPI use. Patient on a prednisone tapering dose. Follow renal function. Continue bicarbonate tablets. Outpatient follow-up.  #6 hypothyroidism TSH 4.120 on 01/16/2017. Continue home dose Synthroid.  #7 morbid obesity  #8 thrombocytopenia Likely in the setting of sepsis. No signs of bleeding. Platelets stable. Follow.   DVT prophylaxis: Coumadin Code Status: Full Family Communication: Updated patient. No family at bedside. Disposition Plan: Remain in step down unit. Patient with bouts of hypotension. Likely skilled nursing facility once hypotension  has resolved and clinical improvement.   Consultants:   Vascular surgery: Dr. Donzetta Matters 01/16/2017  Procedures:  Lower  extremity Dopplers 01/16/2017  Chest x-ray 01/16/2017  VQ scan 01/16/2017  2-D echo 01/18/2017  IVC/iliac duplex 01/16/2017  Antimicrobials:   IV vancomycin 01/16/2017>>>> 01/19/2017  Levaquin 01/16/2017>>>> 01/20/2017   Subjective: Patient laying in bed. Patient states some improvement with dizziness and lightheadedness with change in position. No nausea or vomiting. No shortness of breath. No chest pain. No bleeding.   Objective: Vitals:   01/21/17 1937 01/22/17 0001 01/22/17 0358 01/22/17 0500  BP: (!) 142/91 131/75 108/61   Pulse: 90 89 85   Resp: 14 14 13    Temp: 98.1 F (36.7 C) 98 F (36.7 C) 97.9 F (36.6 C)   TempSrc: Oral Oral Oral   SpO2: 97% 97% 97%   Weight:    116.4 kg (256 lb 9.9 oz)  Height:        Intake/Output Summary (Last 24 hours) at 01/22/17 0953 Last data filed at 01/22/17 0859  Gross per 24 hour  Intake                0 ml  Output              850 ml  Net             -850 ml   Filed Weights   01/20/17 0354 01/21/17 0500 01/22/17 0500  Weight: 116.5 kg (256 lb 13.4 oz) 116.1 kg (255 lb 15.3 oz) 116.4 kg (256 lb 9.9 oz)    Examination:  General exam: Appears calm and comfortable  Respiratory system: Clear to auscultation. Respiratory effort normal. Cardiovascular system: S1 & S2 heard, RRR. No JVD, murmurs, rubs, gallops or clicks. No pedal edema. Gastrointestinal system: Abdomen is nondistended, soft and nontender. No organomegaly or masses felt. Normal bowel sounds heard. Central nervous system: Alert and oriented. No focal neurological deficits. Extremities: Symmetric 5 x 5 power. Skin: No rashes, lesions or ulcers Psychiatry: Judgement and insight appear normal. Mood & affect appropriate.     Data Reviewed: I have personally reviewed following labs and imaging studies  CBC:  Recent Labs Lab 01/16/17 0139  01/18/17 0524 01/19/17 0327 01/20/17 0337 01/21/17 0332 01/22/17 0301  WBC 12.4*  < > 7.3 7.0 7.2 7.2 6.4    NEUTROABS 10.2*  --   --   --   --   --   --   HGB 11.7*  < > 10.2* 9.6* 9.4* 9.8* 9.3*  HCT 36.2  < > 30.0* 27.8* 28.7* 29.3* 28.6*  MCV 91.6  < > 90.4 88.8 89.7 91.0 91.7  PLT 149*  < > 92* 91* 100* 95* 104*  < > = values in this interval not displayed. Basic Metabolic Panel:  Recent Labs Lab 01/17/17 0211 01/18/17 0524 01/19/17 0327 01/21/17 0912 01/22/17 0301  NA 141 139 138 137 137  K 3.4* 3.3* 3.9 3.9 3.9  CL 106 105 103 103 105  CO2 24 24 24 24 24   GLUCOSE 101* 100* 125* 98 121*  BUN 32* 33* 34* 37* 38*  CREATININE 2.67* 3.07* 2.76* 2.76* 2.80*  CALCIUM 8.9 8.9 8.8* 9.0 8.8*   GFR: Estimated Creatinine Clearance: 25.2 mL/min (A) (by C-G formula based on SCr of 2.8 mg/dL (H)). Liver Function Tests: No results for input(s): AST, ALT, ALKPHOS, BILITOT, PROT, ALBUMIN in the last 168 hours. No results for input(s): LIPASE, AMYLASE in the last 168 hours. No results for input(s): AMMONIA  in the last 168 hours. Coagulation Profile:  Recent Labs Lab 01/18/17 0524 01/19/17 0327 01/20/17 0337 01/21/17 0332 01/22/17 0301  INR 1.37 1.34 2.05 2.60 2.95   Cardiac Enzymes:  Recent Labs Lab 01/16/17 0139 01/16/17 0555  TROPONINI 0.08* 0.08*   BNP (last 3 results) No results for input(s): PROBNP in the last 8760 hours. HbA1C: No results for input(s): HGBA1C in the last 72 hours. CBG: No results for input(s): GLUCAP in the last 168 hours. Lipid Profile: No results for input(s): CHOL, HDL, LDLCALC, TRIG, CHOLHDL, LDLDIRECT in the last 72 hours. Thyroid Function Tests: No results for input(s): TSH, T4TOTAL, FREET4, T3FREE, THYROIDAB in the last 72 hours. Anemia Panel: No results for input(s): VITAMINB12, FOLATE, FERRITIN, TIBC, IRON, RETICCTPCT in the last 72 hours. Sepsis Labs:  Recent Labs Lab 01/16/17 0555 01/16/17 0747 01/16/17 1112 01/17/17 0211  PROCALCITON <0.10  --   --   --   LATICACIDVEN 3.4* 2.9* 2.2* 1.1    Recent Results (from the past 240  hour(s))  Blood Culture (routine x 2)     Status: None   Collection Time: 01/16/17  3:14 AM  Result Value Ref Range Status   Specimen Description BLOOD LEFT ANTECUBITAL  Final   Special Requests   Final    BOTTLES DRAWN AEROBIC AND ANAEROBIC Blood Culture adequate volume   Culture NO GROWTH 5 DAYS  Final   Report Status 01/21/2017 FINAL  Final  Blood Culture (routine x 2)     Status: Abnormal   Collection Time: 01/16/17  3:22 AM  Result Value Ref Range Status   Specimen Description BLOOD RIGHT HAND  Final   Special Requests IN PEDIATRIC BOTTLE Blood Culture adequate volume  Final   Culture  Setup Time   Final    GRAM POSITIVE COCCI IN CLUSTERS IN PEDIATRIC BOTTLE CRITICAL RESULT CALLED TO, READ BACK BY AND VERIFIED WITH: A MASTERS,PHARMD AT 0908 01/17/17 BY L BENFIELD    Culture (A)  Final    STAPHYLOCOCCUS SPECIES (COAGULASE NEGATIVE) THE SIGNIFICANCE OF ISOLATING THIS ORGANISM FROM A SINGLE SET OF BLOOD CULTURES WHEN MULTIPLE SETS ARE DRAWN IS UNCERTAIN. PLEASE NOTIFY THE MICROBIOLOGY DEPARTMENT WITHIN ONE WEEK IF SPECIATION AND SENSITIVITIES ARE REQUIRED.    Report Status 01/18/2017 FINAL  Final  Blood Culture ID Panel (Reflexed)     Status: Abnormal   Collection Time: 01/16/17  3:22 AM  Result Value Ref Range Status   Enterococcus species NOT DETECTED NOT DETECTED Final   Listeria monocytogenes NOT DETECTED NOT DETECTED Final   Staphylococcus species DETECTED (A) NOT DETECTED Final    Comment: Methicillin (oxacillin) susceptible coagulase negative staphylococcus. Possible blood culture contaminant (unless isolated from more than one blood culture draw or clinical case suggests pathogenicity). No antibiotic treatment is indicated for blood  culture contaminants. CRITICAL RESULT CALLED TO, READ BACK BY AND VERIFIED WITH: A MASTERS,PHARMD AT 0908 01/17/17 BY L BENFIELD    Staphylococcus aureus NOT DETECTED NOT DETECTED Final   Methicillin resistance NOT DETECTED NOT DETECTED Final     Streptococcus species NOT DETECTED NOT DETECTED Final   Streptococcus agalactiae NOT DETECTED NOT DETECTED Final   Streptococcus pneumoniae NOT DETECTED NOT DETECTED Final   Streptococcus pyogenes NOT DETECTED NOT DETECTED Final   Acinetobacter baumannii NOT DETECTED NOT DETECTED Final   Enterobacteriaceae species NOT DETECTED NOT DETECTED Final   Enterobacter cloacae complex NOT DETECTED NOT DETECTED Final   Escherichia coli NOT DETECTED NOT DETECTED Final   Klebsiella oxytoca NOT DETECTED NOT  DETECTED Final   Klebsiella pneumoniae NOT DETECTED NOT DETECTED Final   Proteus species NOT DETECTED NOT DETECTED Final   Serratia marcescens NOT DETECTED NOT DETECTED Final   Haemophilus influenzae NOT DETECTED NOT DETECTED Final   Neisseria meningitidis NOT DETECTED NOT DETECTED Final   Pseudomonas aeruginosa NOT DETECTED NOT DETECTED Final   Candida albicans NOT DETECTED NOT DETECTED Final   Candida glabrata NOT DETECTED NOT DETECTED Final   Candida krusei NOT DETECTED NOT DETECTED Final   Candida parapsilosis NOT DETECTED NOT DETECTED Final   Candida tropicalis NOT DETECTED NOT DETECTED Final  MRSA PCR Screening     Status: None   Collection Time: 01/16/17  7:04 AM  Result Value Ref Range Status   MRSA by PCR NEGATIVE NEGATIVE Final    Comment:        The GeneXpert MRSA Assay (FDA approved for NASAL specimens only), is one component of a comprehensive MRSA colonization surveillance program. It is not intended to diagnose MRSA infection nor to guide or monitor treatment for MRSA infections.          Radiology Studies: No results found.      Scheduled Meds: . citalopram  20 mg Oral Daily  . levothyroxine  88 mcg Oral QAC breakfast  . mouth rinse  15 mL Mouth Rinse BID  . predniSONE  30 mg Oral Q breakfast  . sodium bicarbonate  650 mg Oral BID  . sodium chloride flush  3 mL Intravenous Q12H  . Warfarin - Pharmacist Dosing Inpatient   Does not apply q1800    Continuous Infusions: . sodium chloride       LOS: 6 days    Time spent: [place], MD [facility] Pager [phone]  If 7PM-7AM, please contact night-coverage [email or web address] Password [email or web address] 01/22/2017, 9:53 AM

## 2017-01-22 NOTE — Progress Notes (Signed)
Physical Therapy Treatment Patient Details Name: Rachael [name] MRN: [id number] DOB: 1956-05-20 Today's Date: 01/22/2017    History of Present Illness 61 y.o. female with medical history significant of CHF last EF 60-65% in 10/2016, HTN, hypothyroidism, CVA w/o residual deficits; who presents with acute onset of shortness of breath. Patient was just admitted into the hospital with acute renal failure from 3/21-3/29.  Bilateral lower extremity Doppler consistent with extensive DVT bilateral.  The V/Q scan consistent with high probability for PE    PT Comments    Patient is making gradual progress toward mobility goals. Limited by dizziness this session however vitals WNL and BP taken in supine, sitting, and standing. Continue to progress as tolerated with anticipated d/c to SNF for further skilled PT services.     Follow Up Recommendations  SNF     Equipment Recommendations  None recommended by PT    Recommendations for Other Services       Precautions / Restrictions Precautions Precautions: Fall    Mobility  Bed Mobility Overal bed mobility: Needs Assistance Bed Mobility: Supine to Sit     Supine to sit: Min assist     General bed mobility comments: assist to elevate trunk into sitting and for balance upon sitting due to dizziness  Transfers Overall transfer level: Needs assistance Equipment used: Rolling walker (2 wheeled) Transfers: Sit to/from Stand Sit to Stand: Min assist;+2 physical assistance         General transfer comment: X2 from EOB; first trial pt able to stand for ~2 mins and BP taken; assist to power up into standing and to steady upon stand as pt was very dizzy, nauseated, and c/o double vision; vitals WNL  Ambulation/Gait Ambulation/Gait assistance: Min guard Ambulation Distance (Feet): 2 Feet Assistive device: Rolling walker (2 wheeled) Gait Pattern/deviations: Step-through pattern     General Gait Details: min guard for safety due to c/o  dizziness   Stairs            Wheelchair Mobility    Modified Rankin (Stroke Patients Only)       Balance Overall balance assessment: Needs assistance Sitting-balance support: Feet supported Sitting balance-Leahy Scale: Fair       Standing balance-Leahy Scale: Poor                              Cognition Arousal/Alertness: Awake/alert Behavior During Therapy: WFL for tasks assessed/performed Overall Cognitive Status: Within Functional Limits for tasks assessed                                        Exercises      General Comments General comments (skin integrity, edema, etc.): BP taken supine, sitting, and standing; VSS throughout session; pt on 2L O2 via [place]      Pertinent Vitals/Pain Pain Assessment: No/denies pain    Home Living                      Prior Function            PT Goals (current goals can now be found in the care plan section) Progress towards PT goals: Progressing toward goals    Frequency    Min 3X/week      PT Plan Current plan remains appropriate    Co-evaluation  AM-PAC PT "6 Clicks" Daily Activity  Outcome Measure  Difficulty turning over in bed (including adjusting bedclothes, sheets and blankets)?: A Little Difficulty moving from lying on back to sitting on the side of the bed? : Total Difficulty sitting down on and standing up from a chair with arms (e.g., wheelchair, bedside commode, etc,.)?: Total Help needed moving to and from a bed to chair (including a wheelchair)?: A Little Help needed walking in hospital room?: A Lot Help needed climbing 3-5 steps with a railing? : A Lot 6 Click Score: 12    End of Session Equipment Utilized During Treatment: Gait belt;Oxygen Activity Tolerance: Other (comment) (limited by c/o dizziness) Patient left: in chair;with call bell/phone within reach Nurse Communication: Mobility status;Other (comment) (dizziness) PT Visit  Diagnosis: Muscle weakness (generalized) (M62.81);Other abnormalities of gait and mobility (R26.89);Difficulty in walking, not elsewhere classified (R26.2)     Time: 0102-7253 PT Time Calculation (min) (ACUTE ONLY): 21 min  Charges:  $Therapeutic Activity: 8-22 mins                    G Codes:       Earney Navy, PTA Pager: [phone]     Darliss Cheney 01/22/2017, 4:14 PM

## 2017-01-22 NOTE — Progress Notes (Signed)
Patient has chosen [facility] for SNF choice- CSW initiated [facility] authorization for possible DC tomorrow or over the weekend  CSW will continue to follow  Rachael [name], [place] Social Worker [phone]

## 2017-01-22 NOTE — Progress Notes (Signed)
ANTICOAGULATION CONSULT NOTE - Follow Up Consult  Pharmacy Consult for warfarin  Indication: Extensive bilateral DVTs, PE   Allergies  Allergen Reactions  . Proton Pump Inhibitors     Biopsy proven acute interstitial nephritis; required dialysis in 09/2016  . Protonix [Pantoprazole] Anaphylaxis  . Penicillins Rash and Other (See Comments)    Tolerates rocephin  . Sulfa Antibiotics Rash   Patient Measurements: Height: 5\' 2"  (157.5 cm) Weight: 256 lb 9.9 oz (116.4 kg) IBW/kg (Calculated) : 50.1  Vital Signs: Temp: 97.9 F (36.6 C) (05/17 0358) Temp Source: Oral (05/17 0358) BP: 108/61 (05/17 0358) Pulse Rate: 85 (05/17 0358)  Labs:  Recent Labs  01/20/17 0337 01/21/17 0332 01/21/17 0912 01/22/17 0301  HGB 9.4* 9.8*  --  9.3*  HCT 28.7* 29.3*  --  28.6*  PLT 100* 95*  --  104*  LABPROT 23.4* 28.3*  --  31.4*  INR 2.05 2.60  --  2.95  HEPARINUNFRC 0.51 0.51  --   --   CREATININE  --   --  2.76* 2.80*    Estimated Creatinine Clearance: 25.2 mL/min (A) (by C-G formula based on SCr of 2.8 mg/dL (H)).  Assessment: On warfarin for extensive bilateral DVTs and high probability for PE per VQ scan. INR therapeutic and beginning to level off. Heparin stopped on 5/16.     Goal of Therapy:  INR 2-3 Monitor platelets by anticoagulation protocol: Yes   Plan:  - Warfarin 5 mg PO x1 tonight - Daily INR - Monitor pltc and s/sx of bleeding   Vincenza Hews, PharmD, BCPS 01/22/2017, 10:10 AM

## 2017-01-23 DIAGNOSIS — I951 Orthostatic hypotension: Secondary | ICD-10-CM

## 2017-01-23 DIAGNOSIS — I517 Cardiomegaly: Secondary | ICD-10-CM

## 2017-01-23 DIAGNOSIS — R0902 Hypoxemia: Secondary | ICD-10-CM

## 2017-01-23 DIAGNOSIS — I371 Nonrheumatic pulmonary valve insufficiency: Secondary | ICD-10-CM

## 2017-01-23 HISTORY — DX: Nonrheumatic pulmonary valve insufficiency: I51.7

## 2017-01-23 HISTORY — DX: Orthostatic hypotension: I95.1

## 2017-01-23 LAB — BASIC METABOLIC PANEL
ANION GAP: 10 (ref 5–15)
BUN: 35 mg/dL — AB (ref 6–20)
CO2: 23 mmol/L (ref 22–32)
Calcium: 8.5 mg/dL — ABNORMAL LOW (ref 8.9–10.3)
Chloride: 104 mmol/L (ref 101–111)
Creatinine, Ser: 2.63 mg/dL — ABNORMAL HIGH (ref 0.44–1.00)
GFR calc Af Amer: 21 mL/min — ABNORMAL LOW (ref >60)
GFR calc non Af Amer: 18 mL/min — ABNORMAL LOW (ref >60)
GLUCOSE: 180 mg/dL — AB (ref 65–99)
POTASSIUM: 4.5 mmol/L (ref 3.5–5.1)
Sodium: 137 mmol/L (ref 135–145)

## 2017-01-23 LAB — CBC
HEMATOCRIT: 29 % — AB (ref 36.0–46.0)
Hemoglobin: 9.4 g/dL — ABNORMAL LOW (ref 12.0–15.0)
MCH: 29.5 pg (ref 26.0–34.0)
MCHC: 32.4 g/dL (ref 30.0–36.0)
MCV: 90.9 fL (ref 78.0–100.0)
Platelets: 97 10*3/uL — ABNORMAL LOW (ref 150–400)
RBC: 3.19 MIL/uL — AB (ref 3.87–5.11)
RDW: 16.6 % — AB (ref 11.5–15.5)
WBC: 7.6 10*3/uL (ref 4.0–10.5)

## 2017-01-23 LAB — PROTIME-INR
INR: 2.8
Prothrombin Time: 30.1 seconds — ABNORMAL HIGH (ref 11.4–15.2)

## 2017-01-23 MED ORDER — WARFARIN SODIUM 5 MG PO TABS
7.5000 mg | ORAL_TABLET | Freq: Once | ORAL | Status: AC
  Administered 2017-01-23: 7.5 mg via ORAL
  Filled 2017-01-23: qty 2

## 2017-01-23 NOTE — Progress Notes (Signed)
ANTICOAGULATION CONSULT NOTE - Follow Up Consult  Pharmacy Consult for warfarin  Indication: Extensive bilateral DVTs, PE   Allergies  Allergen Reactions  . Proton Pump Inhibitors     Biopsy proven acute interstitial nephritis; required dialysis in 09/2016  . Protonix [Pantoprazole] Anaphylaxis  . Penicillins Rash and Other (See Comments)    Tolerates rocephin  . Sulfa Antibiotics Rash   Patient Measurements: Height: 5\' 2"  (157.5 cm) Weight: 257 lb 8 oz (116.8 kg) IBW/kg (Calculated) : 50.1  Vital Signs: Temp: 97.8 F (36.6 C) (05/18 0739) Temp Source: Oral (05/18 0739) BP: 102/47 (05/18 0739) Pulse Rate: 83 (05/18 0739)  Labs:  Recent Labs  01/21/17 0332 01/21/17 0912 01/22/17 0301 01/23/17 0303  HGB 9.8*  --  9.3* 9.4*  HCT 29.3*  --  28.6* 29.0*  PLT 95*  --  104* 97*  LABPROT 28.3*  --  31.4* 30.1*  INR 2.60  --  2.95 2.80  HEPARINUNFRC 0.51  --   --   --   CREATININE  --  2.76* 2.80* 2.63*    Estimated Creatinine Clearance: 26.9 mL/min (A) (by C-G formula based on SCr of 2.63 mg/dL (H)).  Assessment: On warfarin for extensive bilateral DVTs and high probability for PE per VQ scan. INR therapeutic and beginning to level off. Heparin stopped on 5/16.     Goal of Therapy:  INR 2-3 Monitor platelets by anticoagulation protocol: Yes   Plan:  - Warfarin 7.5 mg PO x1 tonight - Daily INR - Monitor pltc and s/sx of bleeding   Vincenza Hews, PharmD, BCPS 01/23/2017, 8:56 AM

## 2017-01-23 NOTE — Care Management Important Message (Signed)
Important Message  Patient Details  Name: Rachael [name] MRN: [id number] Date of Birth: 01-09-1956   Medicare Important Message Given:  Yes    Nathen May 01/23/2017, 3:34 PM

## 2017-01-23 NOTE — Progress Notes (Signed)
CSW informed that pt not interested in [facility] any longer- 1st preference is [facility] but [facility] declined  [facility] is next choice and they can accept over the weekend  [facility] [employer] # received [phone] and good through 5/20  CSW will continue to follow  Jorge Ny, [place] Social Worker [phone]

## 2017-01-23 NOTE — Progress Notes (Signed)
Physical Therapy Treatment Patient Details Name: Rachael [name] MRN: [id number] DOB: 1956/06/19 Today's Date: 01/23/2017    History of Present Illness 61 y.o. female with medical history significant of CHF last EF 60-65% in 10/2016, HTN, hypothyroidism, CVA w/o residual deficits; who presents with acute onset of shortness of breath. Patient was just admitted into the hospital with acute renal failure from 3/21-3/29.  Bilateral lower extremity Doppler consistent with extensive DVT bilateral.  The V/Q scan consistent with high probability for PE    PT Comments    Patient is making gradual progress with mobility. Still with dizziness in sitting and standing. Orthostatic BP taken-see general comments below. Continue to progress as tolerated with anticipated d/c to SNF for further skilled PT services.     Follow Up Recommendations  SNF     Equipment Recommendations  None recommended by PT    Recommendations for Other Services       Precautions / Restrictions Precautions Precautions: Fall    Mobility  Bed Mobility Overal bed mobility: Needs Assistance Bed Mobility: Supine to Sit     Supine to sit: Supervision     General bed mobility comments: supervision for safety and managing lines; HOB flat and no use of rails  Transfers Overall transfer level: Needs assistance Equipment used: Rolling walker (2 wheeled) Transfers: Sit to/from Stand Sit to Stand: Min assist         General transfer comment: assist to power up into standing from EOB and recliner; cues for hand placement  Ambulation/Gait Ambulation/Gait assistance: Min guard Ambulation Distance (Feet): 6 Feet Assistive device: Rolling walker (2 wheeled) Gait Pattern/deviations: Step-through pattern;Decreased stride length     General Gait Details: cues for breathing technique; pt with weak bilat LE after 17ft and HR up to 122   Stairs            Wheelchair Mobility    Modified Rankin (Stroke Patients Only)       Balance Overall balance assessment: Needs assistance Sitting-balance support: Feet supported Sitting balance-Leahy Scale: Good       Standing balance-Leahy Scale: Poor                              Cognition Arousal/Alertness: Awake/alert Behavior During Therapy: WFL for tasks assessed/performed;Anxious (anxious with mobility) Overall Cognitive Status: Within Functional Limits for tasks assessed                                        Exercises      General Comments General comments (skin integrity, edema, etc.): BP supine 96/43, sitting EOB 117/81, standing 122/108, and seated 112/62; pt c/o dizziness in sitting and standing with double vision and nausea in standing initially however not with ambulation      Pertinent Vitals/Pain Pain Assessment: No/denies pain    Home Living                      Prior Function            PT Goals (current goals can now be found in the care plan section) Progress towards PT goals: Progressing toward goals    Frequency    Min 3X/week      PT Plan Current plan remains appropriate    Co-evaluation  AM-PAC PT "6 Clicks" Daily Activity  Outcome Measure  Difficulty turning over in bed (including adjusting bedclothes, sheets and blankets)?: A Little Difficulty moving from lying on back to sitting on the side of the bed? : A Lot Difficulty sitting down on and standing up from a chair with arms (e.g., wheelchair, bedside commode, etc,.)?: Total Help needed moving to and from a bed to chair (including a wheelchair)?: A Little Help needed walking in hospital room?: A Lot Help needed climbing 3-5 steps with a railing? : A Lot 6 Click Score: 13    End of Session Equipment Utilized During Treatment: Gait belt;Oxygen Activity Tolerance: Other (comment);Patient limited by fatigue (limited by c/o dizziness) Patient left: in chair;with call bell/phone within reach;with chair  alarm set Nurse Communication: Mobility status;Other (comment) (BPs and dizziness) PT Visit Diagnosis: Muscle weakness (generalized) (M62.81);Other abnormalities of gait and mobility (R26.89);Difficulty in walking, not elsewhere classified (R26.2)     Time: 7373-6681 PT Time Calculation (min) (ACUTE ONLY): 32 min  Charges:  $Gait Training: 8-22 mins $Therapeutic Activity: 8-22 mins                    G Codes:       Earney Navy, PTA Pager: [phone]     Darliss Cheney 01/23/2017, 11:54 AM

## 2017-01-23 NOTE — Progress Notes (Signed)
PROGRESS NOTE    Rachael [name]  HCW:[id number] DOB: 05-May-1956 DOA: 01/16/2017 PCP: Ronita Hipps, MD    Brief Narrative:  61 y.o.femalewith medical history significant of CHF last EF 60-65% in 10/2016, HTN, hypothyroidism, CVA w/o residual deficits; who presents with acute onset of shortness of breath. Patient was just admitted into the hospital with acute renal failure from 3/21-3/29. In the ER patient was found to have hypoxia, possible sepsis. Chest x-ray clear. Also started on IV heparin for possible PE.    Assessment & Plan:   Principal Problem:   Sepsis ([place]) Active Problems:   Acute respiratory failure with hypoxia (HCC)   Pulmonary embolus (HCC)   Hypotension   Acute deep vein thrombosis (DVT) of both lower extremities (HCC)   Hypothyroidism   History of CVA (cerebrovascular accident)   Depression   CKD (chronic kidney disease), stage IV (HCC)   CHF exacerbation (HCC)   Elevated troponin   Orthostasis  #1 sepsis Concern for sepsis of unknown etiology. Blood cultures 1 bottle growing coag-negative staph likely a contaminant. Patient has improved clinically in terms of sepsis criteria. Patient currently afebrile. Normal white count. Patient status post 4 days of IV vancomycin. Vancomycin discontinued on 01/19/2017. Patient status post 5 days of Levaquin. Follow.  #2 acute respiratory failure with hypoxia likely secondary to PE Patient had presented with shortness of breath. Unable to get a CT scan of the chest with contrast due to patient's chronic kidney disease. Lower extremity Dopplers consistent with extensive bilateral DVT. VQ scan with high probability for PE. Patient s/p IV heparin bridge and Coumadin with goal INR 2-3. INR is currently 2.80. IV heparin has been discontinued. Currently on Coumadin. Patient currently on 2 L nasal cannula with sats of 90%. Patient noted to be hypotensive on 5/15 and 5/16 and when she got up to ambulate with physical therapy. Patient  given a bolus 1 L normal saline with improvement with blood pressure.Initial concern that hypotension likely secondary to PE. 2-D echo with dilated right ventricle with normal EF. Patient status post course of antibiotic treatment. Patient seen in consultation by vascular surgery, Dr. Donzetta Matters who had recommended to continue anticoagulation for now no need for IVC filter. Will have PCCM evaluate hypotesion/orthostasis. Follow closely.  #3 hypotension/orthostatic hypotension Concern for hemodynamic instability likely secondary to PE. VQ scan with hypermobility of PE. Patient with bilateral DVTs s/p IV heparin bridge with Coumadin. Blood pressure improving however patient still orthostatic with symptoms of dizziness and lightheadedness limiting physical therapy. Continue IV fluids with gentle hydration at 75 mL per hour for another 24 hours. Consult with critical care medicine for further evaluation and management. If patient still orthostatic may need to be placed on Midrin.  #4 bilateral lower extremity DVT/PE Noted on bilateral lower extremity Dopplers. Patient had presented with shortness of breath. VQ scan with high probability of PE. Patient currently s/p IV heparin bridge with Coumadin. Heparin has been discontinued. INR currently at 2.80. Goal INR 2-3. Patient has been seen in consultation by vascular surgery, Dr.Cain who recommended anticoagulation and no need for IVC filter at this time.  #5 probable chronic kidney disease stage IV Patient with serum creatinine levels stabilized.  Patient status post kidney biopsy proven AIN after PPI use. Patient on a prednisone tapering dose. Follow renal function. Continue bicarbonate tablets. Outpatient follow-up.  #6 hypothyroidism TSH 4.120 on 01/16/2017. Continue home dose Synthroid.  #7 morbid obesity  #8 thrombocytopenia Likely in the setting of sepsis. No signs  of bleeding. Platelets stable. Follow.   DVT prophylaxis: Coumadin Code Status:  Full Family Communication: Updated patient. No family at bedside. Disposition Plan: Remain in step down unit. Patient with bouts of hypotension/orthostasis. Likely skilled nursing facility once hypotension has resolved and clinical improvement.   Consultants:   Vascular surgery: Dr. Donzetta Matters 01/16/2017  PCCM pending  Procedures:  Lower extremity Dopplers 01/16/2017  Chest x-ray 01/16/2017  VQ scan 01/16/2017  2-D echo 01/18/2017  IVC/iliac duplex 01/16/2017  Antimicrobials:   IV vancomycin 01/16/2017>>>> 01/19/2017  Levaquin 01/16/2017>>>> 01/20/2017   Subjective: Patient laying in bed. Patient states some improvement with dizziness and lightheadedness with change in position however still significantly dizzy on standing up limiting physical therapy. No nausea or vomiting. No shortness of breath. No chest pain. No bleeding.   Objective: Vitals:   01/23/17 0008 01/23/17 0403 01/23/17 0500 01/23/17 0739  BP: (!) 116/57 115/64  (!) 102/47  Pulse: 96 90  83  Resp: 13 14    Temp: 97.8 F (36.6 C) 98.2 F (36.8 C)  97.8 F (36.6 C)  TempSrc: Oral Oral  Oral  SpO2: 91% 91%  95%  Weight:   116.7 kg (257 lb 4.4 oz) 116.8 kg (257 lb 8 oz)  Height:        Intake/Output Summary (Last 24 hours) at 01/23/17 1007 Last data filed at 01/23/17 0739  Gross per 24 hour  Intake           941.33 ml  Output              850 ml  Net            91.33 ml   Filed Weights   01/22/17 1000 01/23/17 0500 01/23/17 0739  Weight: 116.9 kg (257 lb 11.5 oz) 116.7 kg (257 lb 4.4 oz) 116.8 kg (257 lb 8 oz)    Examination:  General exam: Appears calm and comfortable  Respiratory system: Clear to auscultation. Respiratory effort normal. Cardiovascular system: S1 & S2 heard, RRR. No JVD, murmurs, rubs, gallops or clicks. No pedal edema. Gastrointestinal system: Abdomen is nondistended, soft and nontender. No organomegaly or masses felt. Normal bowel sounds heard. Central nervous system: Alert  and oriented. No focal neurological deficits. Extremities: Symmetric 5 x 5 power. Skin: No rashes, lesions or ulcers Psychiatry: Judgement and insight appear normal. Mood & affect appropriate.     Data Reviewed: I have personally reviewed following labs and imaging studies  CBC:  Recent Labs Lab 01/19/17 0327 01/20/17 0337 01/21/17 0332 01/22/17 0301 01/23/17 0303  WBC 7.0 7.2 7.2 6.4 7.6  HGB 9.6* 9.4* 9.8* 9.3* 9.4*  HCT 27.8* 28.7* 29.3* 28.6* 29.0*  MCV 88.8 89.7 91.0 91.7 90.9  PLT 91* 100* 95* 104* 97*   Basic Metabolic Panel:  Recent Labs Lab 01/18/17 0524 01/19/17 0327 01/21/17 0912 01/22/17 0301 01/23/17 0303  NA 139 138 137 137 137  K 3.3* 3.9 3.9 3.9 4.5  CL 105 103 103 105 104  CO2 24 24 24 24 23   GLUCOSE 100* 125* 98 121* 180*  BUN 33* 34* 37* 38* 35*  CREATININE 3.07* 2.76* 2.76* 2.80* 2.63*  CALCIUM 8.9 8.8* 9.0 8.8* 8.5*   GFR: Estimated Creatinine Clearance: 26.9 mL/min (A) (by C-G formula based on SCr of 2.63 mg/dL (H)). Liver Function Tests: No results for input(s): AST, ALT, ALKPHOS, BILITOT, PROT, ALBUMIN in the last 168 hours. No results for input(s): LIPASE, AMYLASE in the last 168 hours. No results for input(s): AMMONIA in  the last 168 hours. Coagulation Profile:  Recent Labs Lab 01/19/17 0327 01/20/17 0337 01/21/17 0332 01/22/17 0301 01/23/17 0303  INR 1.34 2.05 2.60 2.95 2.80   Cardiac Enzymes: No results for input(s): CKTOTAL, CKMB, CKMBINDEX, TROPONINI in the last 168 hours. BNP (last 3 results) No results for input(s): PROBNP in the last 8760 hours. HbA1C: No results for input(s): HGBA1C in the last 72 hours. CBG: No results for input(s): GLUCAP in the last 168 hours. Lipid Profile: No results for input(s): CHOL, HDL, LDLCALC, TRIG, CHOLHDL, LDLDIRECT in the last 72 hours. Thyroid Function Tests: No results for input(s): TSH, T4TOTAL, FREET4, T3FREE, THYROIDAB in the last 72 hours. Anemia Panel: No results for  input(s): VITAMINB12, FOLATE, FERRITIN, TIBC, IRON, RETICCTPCT in the last 72 hours. Sepsis Labs:  Recent Labs Lab 01/16/17 1112 01/17/17 0211  LATICACIDVEN 2.2* 1.1    Recent Results (from the past 240 hour(s))  Blood Culture (routine x 2)     Status: None   Collection Time: 01/16/17  3:14 AM  Result Value Ref Range Status   Specimen Description BLOOD LEFT ANTECUBITAL  Final   Special Requests   Final    BOTTLES DRAWN AEROBIC AND ANAEROBIC Blood Culture adequate volume   Culture NO GROWTH 5 DAYS  Final   Report Status 01/21/2017 FINAL  Final  Blood Culture (routine x 2)     Status: Abnormal   Collection Time: 01/16/17  3:22 AM  Result Value Ref Range Status   Specimen Description BLOOD RIGHT HAND  Final   Special Requests IN PEDIATRIC BOTTLE Blood Culture adequate volume  Final   Culture  Setup Time   Final    GRAM POSITIVE COCCI IN CLUSTERS IN PEDIATRIC BOTTLE CRITICAL RESULT CALLED TO, READ BACK BY AND VERIFIED WITH: A MASTERS,PHARMD AT 0908 01/17/17 BY L BENFIELD    Culture (A)  Final    STAPHYLOCOCCUS SPECIES (COAGULASE NEGATIVE) THE SIGNIFICANCE OF ISOLATING THIS ORGANISM FROM A SINGLE SET OF BLOOD CULTURES WHEN MULTIPLE SETS ARE DRAWN IS UNCERTAIN. PLEASE NOTIFY THE MICROBIOLOGY DEPARTMENT WITHIN ONE WEEK IF SPECIATION AND SENSITIVITIES ARE REQUIRED.    Report Status 01/18/2017 FINAL  Final  Blood Culture ID Panel (Reflexed)     Status: Abnormal   Collection Time: 01/16/17  3:22 AM  Result Value Ref Range Status   Enterococcus species NOT DETECTED NOT DETECTED Final   Listeria monocytogenes NOT DETECTED NOT DETECTED Final   Staphylococcus species DETECTED (A) NOT DETECTED Final    Comment: Methicillin (oxacillin) susceptible coagulase negative staphylococcus. Possible blood culture contaminant (unless isolated from more than one blood culture draw or clinical case suggests pathogenicity). No antibiotic treatment is indicated for blood  culture contaminants. CRITICAL  RESULT CALLED TO, READ BACK BY AND VERIFIED WITH: A MASTERS,PHARMD AT 0908 01/17/17 BY L BENFIELD    Staphylococcus aureus NOT DETECTED NOT DETECTED Final   Methicillin resistance NOT DETECTED NOT DETECTED Final   Streptococcus species NOT DETECTED NOT DETECTED Final   Streptococcus agalactiae NOT DETECTED NOT DETECTED Final   Streptococcus pneumoniae NOT DETECTED NOT DETECTED Final   Streptococcus pyogenes NOT DETECTED NOT DETECTED Final   Acinetobacter baumannii NOT DETECTED NOT DETECTED Final   Enterobacteriaceae species NOT DETECTED NOT DETECTED Final   Enterobacter cloacae complex NOT DETECTED NOT DETECTED Final   Escherichia coli NOT DETECTED NOT DETECTED Final   Klebsiella oxytoca NOT DETECTED NOT DETECTED Final   Klebsiella pneumoniae NOT DETECTED NOT DETECTED Final   Proteus species NOT DETECTED NOT DETECTED Final  Serratia marcescens NOT DETECTED NOT DETECTED Final   Haemophilus influenzae NOT DETECTED NOT DETECTED Final   Neisseria meningitidis NOT DETECTED NOT DETECTED Final   Pseudomonas aeruginosa NOT DETECTED NOT DETECTED Final   Candida albicans NOT DETECTED NOT DETECTED Final   Candida glabrata NOT DETECTED NOT DETECTED Final   Candida krusei NOT DETECTED NOT DETECTED Final   Candida parapsilosis NOT DETECTED NOT DETECTED Final   Candida tropicalis NOT DETECTED NOT DETECTED Final  MRSA PCR Screening     Status: None   Collection Time: 01/16/17  7:04 AM  Result Value Ref Range Status   MRSA by PCR NEGATIVE NEGATIVE Final    Comment:        The GeneXpert MRSA Assay (FDA approved for NASAL specimens only), is one component of a comprehensive MRSA colonization surveillance program. It is not intended to diagnose MRSA infection nor to guide or monitor treatment for MRSA infections.          Radiology Studies: No results found.      Scheduled Meds: . citalopram  20 mg Oral Daily  . levothyroxine  88 mcg Oral QAC breakfast  . mouth rinse  15 mL Mouth  Rinse BID  . predniSONE  30 mg Oral Q breakfast  . sodium bicarbonate  650 mg Oral BID  . sodium chloride flush  3 mL Intravenous Q12H  . warfarin  7.5 mg Oral ONCE-1800  . Warfarin - Pharmacist Dosing Inpatient   Does not apply q1800   Continuous Infusions: . sodium chloride    . sodium chloride 100 mL/hr at 01/23/17 0948     LOS: 7 days    Time spent: [place], MD [facility] Pager [phone]  If 7PM-7AM, please contact night-coverage [email or web address] Password [email or web address] 01/23/2017, 10:07 AM

## 2017-01-23 NOTE — Consult Note (Signed)
Name: Rachael [name] MRN: [id number] DOB: 06/16/1956    ADMISSION DATE:  01/16/2017 CONSULTATION DATE:  5/18  REFERRING MD : [facility]  CHIEF COMPLAINT: SOB  BRIEF PATIENT DESCRIPTION: MO WF NAD  SIGNIFICANT EVENTS  5/11 PE/DVT  STUDIES:     HISTORY OF PRESENT ILLNESS:   61 yo MO(257 lb)female who presented with CC: SOB on 01/16/17. She has multiple medical medical issues on admission. VQ scan high probability of PE, bilateral lower ext DVT, treated for sepsis hut nl lactic acid, pro calcitonin nl,1/2 bc + but most likely contaminant , treated with abx, heparin transitioned to coumadin. PCCM consulted 5/18 for hypotension(119/65) orthostatic hypotension(note dilated RV on 2d echo).  She reports being dizzy when getting out of bed. Currently she is awake and alert and in no distress. INR is therapeutic. Her PMH is extensive and well documented.   PAST MEDICAL HISTORY :   has a past medical history of CHF (congestive heart failure) ([place]); CKD (chronic kidney disease); CVA (cerebral vascular accident) ([place]); Dyspnea; HTN (hypertension); and Hypothyroidism.  has a past surgical history that includes Cholecystectomy; ir generic historical (09/29/2016); ir generic historical (09/29/2016); TEE without cardioversion (N/A, 10/27/2016); and Cardiac catheterization (2006). Prior to Admission medications   Medication Sig Start Date End Date Taking? Authorizing Provider  citalopram (CELEXA) 20 MG tablet Take 1 tablet (20 mg total) by mouth daily. 12/04/16  Yes Velvet Bathe, MD  famotidine (PEPCID) 20 MG tablet Take 1 tablet (20 mg total) by mouth 2 (two) times daily. Patient taking differently: Take 20 mg by mouth 2 (two) times daily as needed for heartburn.  10/14/16  Yes Verlee Monte, MD  levothyroxine (SYNTHROID, LEVOTHROID) 88 MCG tablet Take 1 tablet (88 mcg total) by mouth daily before breakfast. 10/31/16  Yes Reyne Dumas, MD  metoCLOPramide (REGLAN) 5 MG tablet Take 1 tablet (5 mg total) by mouth  3 (three) times daily at 8am, 3pm and bedtime. Patient taking differently: Take 5 mg by mouth 3 (three) times daily as needed for nausea or vomiting.  12/04/16  Yes Velvet Bathe, MD  predniSONE (DELTASONE) 20 MG tablet Take 3 tablets (60 mg total) by mouth daily with breakfast. Patient taking differently: Take 30 mg by mouth daily with breakfast.  12/05/16  Yes Velvet Bathe, MD   Allergies  Allergen Reactions  . Proton Pump Inhibitors     Biopsy proven acute interstitial nephritis; required dialysis in 09/2016  . Protonix [Pantoprazole] Anaphylaxis  . Penicillins Rash and Other (See Comments)    Tolerates rocephin  . Sulfa Antibiotics Rash    FAMILY HISTORY:  family history includes Coronary artery disease in her father and mother; Hypertension in her brother, father, mother, and sister; Stroke in her mother. SOCIAL HISTORY:  reports that she has never smoked. She has never used smokeless tobacco. She reports that she does not drink alcohol or use drugs.  REVIEW OF SYSTEMS:   10 point review of system taken, please see HPI for positives and negatives.  SUBJECTIVE:  NAD at rest VITAL SIGNS: Temp:  [97.8 F (36.6 C)-98.3 F (36.8 C)] 97.8 F (36.6 C) (05/18 0739) Pulse Rate:  [83-100] 83 (05/18 0739) Resp:  [13-15] 14 (05/18 0403) BP: (93-128)/(47-70) 102/47 (05/18 0739) SpO2:  [91 %-99 %] 95 % (05/18 0739) Weight:  [257 lb 4.4 oz (116.7 kg)-257 lb 8 oz (116.8 kg)] 257 lb 8 oz (116.8 kg) (05/18 0739)  PHYSICAL EXAMINATION: General:  MO female in no distress Neuro: Inatct HEENT:  No  jvd, lan Cardiovascular:  HSR RRR Lungs:  Decreased in bases Abdomen:  Obese +bs Musculoskeletal:  intact Skin: multiple areas of ecchymosis    Recent Labs Lab 01/21/17 0912 01/22/17 0301 01/23/17 0303  NA 137 137 137  K 3.9 3.9 4.5  CL 103 105 104  CO2 24 24 23   BUN 37* 38* 35*  CREATININE 2.76* 2.80* 2.63*  GLUCOSE 98 121* 180*    Recent Labs Lab 01/21/17 0332 01/22/17 0301  01/23/17 0303  HGB 9.8* 9.3* 9.4*  HCT 29.3* 28.6* 29.0*  WBC 7.2 6.4 7.6  PLT 95* 104* 97*   No results found.  ASSESSMENT: Active Problems:    Pulmonary embolus (HCC)    RV dilation by echocardiogram in setting of PE   Acute deep vein thrombosis (DVT) of both lower extremities (HCC)   Hypotension   Orthostasis   Hypothyroidism   History of CVA (cerebrovascular accident)   Depression   CKD (chronic kidney disease), stage IV (HCC)   CHF exacerbation (HCC)   Elevated troponin   Acute respiratory failure with hypoxia [facility])  Discussion:  61 yo MO(257 lb)female who presented with CC: SOB on 01/16/17. She has multiple medical medical issues on admission. VQ scan high probability of PE, bilateral lower ext DVT, treated for sepsis hut nl lactic acid, pro calcitonin nl,1/2 bc + but most likely contaminant , treated with abx, heparin transitioned to coumadin. PCCM consulted 5/18 for hypotension(119/65) orthostatic hypotension(note dilated RV on 2d echo).  She reports being dizzy when getting out of bed. Currently she is awake and alert and in no distress. INR is therapeutic. Her PMH is extensive and well documented.    PLAN: Gentle hydration Cards consult No need for lytics   [facility] ACNP Maryanna Shape PCCM Pager [phone] till 3 pm If no answer page [phone] 01/23/2017, 10:43 AM  Attending Note:  61 year old female with extensive PMH above presenting with an acute PE.  Patient is awake and interactive on exam, following all commands.  No acute distress with clear lungs.  I reviewed chest CT myself, PE noted.  Discussed with [facility]-MD and PCCM-NP.  Orthostatic hypotension:  - IV hydration  - Care with ambulation  - No lytics as patient does not become hypoxemic too only hypotensive  - Recommend calling a cardiology consultation  - Patient is low on volume as there is significant respiratory variation in HR and pleth, recommend more fluid gently.  - Absolutely no lytics indicated  here  Hypoxemia:  - Titrate O2 for sat of 88-92%  - May need an ambulatory desat study prior to discharge for home O2.  PE:  - Coumadin  - INR in AM  - O2 as above  DVT:  - Coumadin  - No IVC necessary  PCCM will be available as needed.  Patient seen and examined, agree with above note.  I dictated the care and orders written for this patient under my direction.  Rush Farmer, [place]

## 2017-01-24 LAB — PROTIME-INR
INR: 3.58
Prothrombin Time: 36.6 seconds — ABNORMAL HIGH (ref 11.4–15.2)

## 2017-01-24 LAB — CBC
HEMATOCRIT: 28.2 % — AB (ref 36.0–46.0)
HEMOGLOBIN: 9.1 g/dL — AB (ref 12.0–15.0)
MCH: 29.4 pg (ref 26.0–34.0)
MCHC: 32.3 g/dL (ref 30.0–36.0)
MCV: 91.3 fL (ref 78.0–100.0)
Platelets: 111 10*3/uL — ABNORMAL LOW (ref 150–400)
RBC: 3.09 MIL/uL — AB (ref 3.87–5.11)
RDW: 16.8 % — ABNORMAL HIGH (ref 11.5–15.5)
WBC: 8.1 10*3/uL (ref 4.0–10.5)

## 2017-01-24 LAB — BASIC METABOLIC PANEL
ANION GAP: 8 (ref 5–15)
BUN: 32 mg/dL — ABNORMAL HIGH (ref 6–20)
CALCIUM: 8.4 mg/dL — AB (ref 8.9–10.3)
CHLORIDE: 106 mmol/L (ref 101–111)
CO2: 24 mmol/L (ref 22–32)
CREATININE: 2.29 mg/dL — AB (ref 0.44–1.00)
GFR, EST AFRICAN AMERICAN: 25 mL/min — AB (ref >60)
GFR, EST NON AFRICAN AMERICAN: 22 mL/min — AB (ref >60)
Glucose, Bld: 133 mg/dL — ABNORMAL HIGH (ref 65–99)
Potassium: 4.1 mmol/L (ref 3.5–5.1)
SODIUM: 138 mmol/L (ref 135–145)

## 2017-01-24 MED ORDER — WARFARIN SODIUM 1 MG PO TABS
1.0000 mg | ORAL_TABLET | Freq: Once | ORAL | Status: AC
  Administered 2017-01-24: 1 mg via ORAL
  Filled 2017-01-24 (×2): qty 1

## 2017-01-24 NOTE — Progress Notes (Addendum)
Left message for RN to return call for hand off report  Patient transferred to [address] after hand report given to RN. Patient was transferred with belongings: cell phone, cell phone charger, flowers in vase, and clothing.

## 2017-01-24 NOTE — Progress Notes (Signed)
PROGRESS NOTE    Rachael [name]  STM:[id number] DOB: 15-Jan-1956 DOA: 01/16/2017 PCP: Ronita Hipps, MD    Brief Narrative:  61 y.o.femalewith medical history significant of CHF last EF 60-65% in 10/2016, HTN, hypothyroidism, CVA w/o residual deficits; who presents with acute onset of shortness of breath. Patient was just admitted into the hospital with acute renal failure from 3/21-3/29. In the ER patient was found to have hypoxia, possible sepsis. Chest x-ray clear. Also started on IV heparin for possible PE.    Assessment & Plan:   Principal Problem:   Sepsis ([place]) Active Problems:   Acute respiratory failure with hypoxia (HCC)   Pulmonary embolus (HCC)   Hypotension   Acute deep vein thrombosis (DVT) of both lower extremities (HCC)   Hypothyroidism   History of CVA (cerebrovascular accident)   Depression   CKD (chronic kidney disease), stage IV (HCC)   CHF exacerbation (HCC)   Elevated troponin   Orthostasis    RV dilation by echocardiogram in setting of PE  #1 sepsis Concern for sepsis of unknown etiology. Blood cultures 1 bottle growing coag-negative staph likely a contaminant. Patient has improved clinically in terms of sepsis criteria. Patient currently afebrile. Normal white count. Patient status post 4 days of IV vancomycin. Vancomycin discontinued on 01/19/2017. Patient status post 5 days of Levaquin. Follow.  #2 acute respiratory failure with hypoxia likely secondary to PE Patient had presented with shortness of breath. Unable to get a CT scan of the chest with contrast due to patient's chronic kidney disease. Lower extremity Dopplers consistent with extensive bilateral DVT. VQ scan with high probability for PE. Patient s/p IV heparin bridge and Coumadin with goal INR 2-3. INR is currently 2.80. IV heparin has been discontinued. Currently on Coumadin. Patient currently on 2 L nasal cannula with sats of 90%. Patient noted to be hypotensive on 5/15 and 5/16 and when she  got up to ambulate with physical therapy. Patient given a bolus 1 L normal saline with improvement with blood pressure.Initial concern that hypotension likely secondary to PE. 2-D echo with dilated right ventricle with normal EF. Patient status post course of antibiotic treatment. Patient seen in consultation by vascular surgery, Dr. Donzetta Matters who had recommended to continue anticoagulation for now no need for IVC filter.Place TED hose.  #3 hypotension/orthostatic hypotension Concern for hemodynamic instability likely secondary to PE. VQ scan with hypermobility of PE. Patient with bilateral DVTs s/p IV heparin bridge with Coumadin. Blood pressure improving however patient still orthostatic with symptoms of dizziness and lightheadedness limiting physical therapy. Orthostasis has improved with gentle hydration. TED hose were ordered early on in the hospitalization however patient currently does not have them on. Will reorder TED hose. Patient has been seen in consultation by critical care medicine to patient's orthostatic hypotension is not related to her PE as patient is not significantly hypoxic with hypotension. Continue gentle hydration. Ask for TED hose to be placed. Follow.   #4 bilateral lower extremity DVT/PE Noted on bilateral lower extremity Dopplers. Patient had presented with shortness of breath. VQ scan with high probability of PE. Patient currently s/p IV heparin bridge with Coumadin. Heparin has been discontinued. INR currently at 3.58. Goal INR 2-3. Patient has been seen in consultation by vascular surgery, Dr.Cain who recommended anticoagulation and no need for IVC filter at this time and TED hose.  #5 probable chronic kidney disease stage IV Patient with serum creatinine levels stabilized.  Patient status post kidney biopsy proven AIN after PPI use.  Patient on a prednisone tapering dose. Follow renal function. Continue bicarbonate tablets. Outpatient follow-up.  #6 hypothyroidism TSH 4.120 on  01/16/2017. Continue home dose Synthroid.  #7 morbid obesity  #8 thrombocytopenia Likely in the setting of sepsis. No signs of bleeding. Platelets stable. Follow.   DVT prophylaxis: Coumadin Code Status: Full Family Communication: Updated patient. No family at bedside. Disposition Plan: Transfer to telemetry. Patient with bouts of hypotension/orthostasis. Likely skilled nursing facility once hypotension has resolved and clinical improvement.   Consultants:   Vascular surgery: Dr. Donzetta Matters 01/16/2017  PCCM : Dr Nelda Marseille 01/23/2017  Procedures:  Lower extremity Dopplers 01/16/2017  Chest x-ray 01/16/2017  VQ scan 01/16/2017  2-D echo 01/18/2017  IVC/iliac duplex 01/16/2017  Antimicrobials:   IV vancomycin 01/16/2017>>>> 01/19/2017  Levaquin 01/16/2017>>>> 01/20/2017   Subjective: Patient laying in bed. Patient states improvement with dizziness and lightheadedness with change in position. No nausea or vomiting. No shortness of breath. No chest pain. No bleeding.   Objective: Vitals:   01/24/17 0003 01/24/17 0307 01/24/17 0500 01/24/17 0731  BP: 124/84 137/81    Pulse: 89 95    Resp: 15 15    Temp: 98.3 F (36.8 C) 97.7 F (36.5 C)    TempSrc: Oral Oral    SpO2: 96% 95%    Weight:   117.2 kg (258 lb 6.1 oz) 117.2 kg (258 lb 6.1 oz)  Height:        Intake/Output Summary (Last 24 hours) at 01/24/17 0944 Last data filed at 01/24/17 0731  Gross per 24 hour  Intake          2100.17 ml  Output              625 ml  Net          1475.17 ml   Filed Weights   01/23/17 0739 01/24/17 0500 01/24/17 0731  Weight: 116.8 kg (257 lb 8 oz) 117.2 kg (258 lb 6.1 oz) 117.2 kg (258 lb 6.1 oz)    Examination:  General exam: NAD Respiratory system: Clear to auscultation. Respiratory effort normal. Cardiovascular system: S1 & S2 heard, RRR. No JVD, murmurs, rubs, gallops or clicks. No pedal edema. Gastrointestinal system: Abdomen is nondistended, soft and nontender. No  organomegaly or masses felt. Normal bowel sounds heard. Central nervous system: Alert and oriented. No focal neurological deficits. Extremities: Symmetric 5 x 5 power. Skin: No rashes, lesions or ulcers Psychiatry: Judgement and insight appear normal. Mood & affect appropriate.     Data Reviewed: I have personally reviewed following labs and imaging studies  CBC:  Recent Labs Lab 01/20/17 0337 01/21/17 0332 01/22/17 0301 01/23/17 0303 01/24/17 0325  WBC 7.2 7.2 6.4 7.6 8.1  HGB 9.4* 9.8* 9.3* 9.4* 9.1*  HCT 28.7* 29.3* 28.6* 29.0* 28.2*  MCV 89.7 91.0 91.7 90.9 91.3  PLT 100* 95* 104* 97* 947*   Basic Metabolic Panel:  Recent Labs Lab 01/19/17 0327 01/21/17 0912 01/22/17 0301 01/23/17 0303 01/24/17 0325  NA 138 137 137 137 138  K 3.9 3.9 3.9 4.5 4.1  CL 103 103 105 104 106  CO2 24 24 24 23 24   GLUCOSE 125* 98 121* 180* 133*  BUN 34* 37* 38* 35* 32*  CREATININE 2.76* 2.76* 2.80* 2.63* 2.29*  CALCIUM 8.8* 9.0 8.8* 8.5* 8.4*   GFR: Estimated Creatinine Clearance: 30.9 mL/min (A) (by C-G formula based on SCr of 2.29 mg/dL (H)). Liver Function Tests: No results for input(s): AST, ALT, ALKPHOS, BILITOT, PROT, ALBUMIN in the last 168  hours. No results for input(s): LIPASE, AMYLASE in the last 168 hours. No results for input(s): AMMONIA in the last 168 hours. Coagulation Profile:  Recent Labs Lab 01/20/17 0337 01/21/17 0332 01/22/17 0301 01/23/17 0303 01/24/17 0325  INR 2.05 2.60 2.95 2.80 3.58   Cardiac Enzymes: No results for input(s): CKTOTAL, CKMB, CKMBINDEX, TROPONINI in the last 168 hours. BNP (last 3 results) No results for input(s): PROBNP in the last 8760 hours. HbA1C: No results for input(s): HGBA1C in the last 72 hours. CBG: No results for input(s): GLUCAP in the last 168 hours. Lipid Profile: No results for input(s): CHOL, HDL, LDLCALC, TRIG, CHOLHDL, LDLDIRECT in the last 72 hours. Thyroid Function Tests: No results for input(s): TSH,  T4TOTAL, FREET4, T3FREE, THYROIDAB in the last 72 hours. Anemia Panel: No results for input(s): VITAMINB12, FOLATE, FERRITIN, TIBC, IRON, RETICCTPCT in the last 72 hours. Sepsis Labs: No results for input(s): PROCALCITON, LATICACIDVEN in the last 168 hours.  Recent Results (from the past 240 hour(s))  Blood Culture (routine x 2)     Status: None   Collection Time: 01/16/17  3:14 AM  Result Value Ref Range Status   Specimen Description BLOOD LEFT ANTECUBITAL  Final   Special Requests   Final    BOTTLES DRAWN AEROBIC AND ANAEROBIC Blood Culture adequate volume   Culture NO GROWTH 5 DAYS  Final   Report Status 01/21/2017 FINAL  Final  Blood Culture (routine x 2)     Status: Abnormal   Collection Time: 01/16/17  3:22 AM  Result Value Ref Range Status   Specimen Description BLOOD RIGHT HAND  Final   Special Requests IN PEDIATRIC BOTTLE Blood Culture adequate volume  Final   Culture  Setup Time   Final    GRAM POSITIVE COCCI IN CLUSTERS IN PEDIATRIC BOTTLE CRITICAL RESULT CALLED TO, READ BACK BY AND VERIFIED WITH: A MASTERS,PHARMD AT 0908 01/17/17 BY L BENFIELD    Culture (A)  Final    STAPHYLOCOCCUS SPECIES (COAGULASE NEGATIVE) THE SIGNIFICANCE OF ISOLATING THIS ORGANISM FROM A SINGLE SET OF BLOOD CULTURES WHEN MULTIPLE SETS ARE DRAWN IS UNCERTAIN. PLEASE NOTIFY THE MICROBIOLOGY DEPARTMENT WITHIN ONE WEEK IF SPECIATION AND SENSITIVITIES ARE REQUIRED.    Report Status 01/18/2017 FINAL  Final  Blood Culture ID Panel (Reflexed)     Status: Abnormal   Collection Time: 01/16/17  3:22 AM  Result Value Ref Range Status   Enterococcus species NOT DETECTED NOT DETECTED Final   Listeria monocytogenes NOT DETECTED NOT DETECTED Final   Staphylococcus species DETECTED (A) NOT DETECTED Final    Comment: Methicillin (oxacillin) susceptible coagulase negative staphylococcus. Possible blood culture contaminant (unless isolated from more than one blood culture draw or clinical case suggests  pathogenicity). No antibiotic treatment is indicated for blood  culture contaminants. CRITICAL RESULT CALLED TO, READ BACK BY AND VERIFIED WITH: A MASTERS,PHARMD AT 0908 01/17/17 BY L BENFIELD    Staphylococcus aureus NOT DETECTED NOT DETECTED Final   Methicillin resistance NOT DETECTED NOT DETECTED Final   Streptococcus species NOT DETECTED NOT DETECTED Final   Streptococcus agalactiae NOT DETECTED NOT DETECTED Final   Streptococcus pneumoniae NOT DETECTED NOT DETECTED Final   Streptococcus pyogenes NOT DETECTED NOT DETECTED Final   Acinetobacter baumannii NOT DETECTED NOT DETECTED Final   Enterobacteriaceae species NOT DETECTED NOT DETECTED Final   Enterobacter cloacae complex NOT DETECTED NOT DETECTED Final   Escherichia coli NOT DETECTED NOT DETECTED Final   Klebsiella oxytoca NOT DETECTED NOT DETECTED Final   Klebsiella pneumoniae  NOT DETECTED NOT DETECTED Final   Proteus species NOT DETECTED NOT DETECTED Final   Serratia marcescens NOT DETECTED NOT DETECTED Final   Haemophilus influenzae NOT DETECTED NOT DETECTED Final   Neisseria meningitidis NOT DETECTED NOT DETECTED Final   Pseudomonas aeruginosa NOT DETECTED NOT DETECTED Final   Candida albicans NOT DETECTED NOT DETECTED Final   Candida glabrata NOT DETECTED NOT DETECTED Final   Candida krusei NOT DETECTED NOT DETECTED Final   Candida parapsilosis NOT DETECTED NOT DETECTED Final   Candida tropicalis NOT DETECTED NOT DETECTED Final  MRSA PCR Screening     Status: None   Collection Time: 01/16/17  7:04 AM  Result Value Ref Range Status   MRSA by PCR NEGATIVE NEGATIVE Final    Comment:        The GeneXpert MRSA Assay (FDA approved for NASAL specimens only), is one component of a comprehensive MRSA colonization surveillance program. It is not intended to diagnose MRSA infection nor to guide or monitor treatment for MRSA infections.          Radiology Studies: No results found.      Scheduled Meds: .  citalopram  20 mg Oral Daily  . levothyroxine  88 mcg Oral QAC breakfast  . mouth rinse  15 mL Mouth Rinse BID  . predniSONE  30 mg Oral Q breakfast  . sodium bicarbonate  650 mg Oral BID  . sodium chloride flush  3 mL Intravenous Q12H  . warfarin  1 mg Oral ONCE-1800  . Warfarin - Pharmacist Dosing Inpatient   Does not apply q1800   Continuous Infusions: . sodium chloride    . sodium chloride 75 mL/hr at 01/23/17 2141     LOS: 8 days    Time spent: [place], MD [facility] Pager [phone]  If 7PM-7AM, please contact night-coverage [email or web address] Password [facility] 01/24/2017, 9:44 AM

## 2017-01-24 NOTE — Progress Notes (Signed)
ANTICOAGULATION CONSULT NOTE - Follow Up Consult  Pharmacy Consult for warfarin  Indication: Extensive bilateral DVTs, PE   Allergies  Allergen Reactions  . Proton Pump Inhibitors     Biopsy proven acute interstitial nephritis; required dialysis in 09/2016  . Protonix [Pantoprazole] Anaphylaxis  . Penicillins Rash and Other (See Comments)    Tolerates rocephin  . Sulfa Antibiotics Rash   Patient Measurements: Height: 5\' 2"  (157.5 cm) Weight: 258 lb 6.1 oz (117.2 kg) IBW/kg (Calculated) : 50.1  Vital Signs: Temp: 97.7 F (36.5 C) (05/19 0307) Temp Source: Oral (05/19 0307) BP: 137/81 (05/19 0307) Pulse Rate: 95 (05/19 0307)  Labs:  Recent Labs  01/22/17 0301 01/23/17 0303 01/24/17 0325  HGB 9.3* 9.4* 9.1*  HCT 28.6* 29.0* 28.2*  PLT 104* 97* 111*  LABPROT 31.4* 30.1* 36.6*  INR 2.95 2.80 3.58  CREATININE 2.80* 2.63* 2.29*    Estimated Creatinine Clearance: 30.9 mL/min (A) (by C-G formula based on SCr of 2.29 mg/dL (H)).  Assessment: 62 yof transitioned from heparin gtt to warfarin for extensive bilateral DVTs and high probability for PE per VQ scan. Heparin stopped on 5/16. INR supratherapeutic at 3.6 today. CBC stable, no overt s/s bleeding noted.   Goal of Therapy:  INR 2-3 Monitor platelets by anticoagulation protocol: Yes   Plan:  - Warfarin 1mg  PO x1 tonight - Daily INR - Monitor pltc and s/sx of bleeding  Argie Ramming, PharmD Pharmacy Resident  Pager [phone] 01/24/17 7:43 AM

## 2017-01-24 NOTE — Progress Notes (Signed)
Clinical Social Worker spoke to MD via phone. Per MD patient will be medically stable for discharge tomorrow. CSW has informed [facility] for possible pending discharge for 01/25/17.  Rhea Pink, MSW,  [place]

## 2017-01-25 DIAGNOSIS — R791 Abnormal coagulation profile: Secondary | ICD-10-CM

## 2017-01-25 HISTORY — DX: Abnormal coagulation profile: R79.1

## 2017-01-25 LAB — BASIC METABOLIC PANEL
Anion gap: 8 (ref 5–15)
BUN: 32 mg/dL — ABNORMAL HIGH (ref 6–20)
CO2: 24 mmol/L (ref 22–32)
CREATININE: 2.12 mg/dL — AB (ref 0.44–1.00)
Calcium: 8.5 mg/dL — ABNORMAL LOW (ref 8.9–10.3)
Chloride: 108 mmol/L (ref 101–111)
GFR calc Af Amer: 28 mL/min — ABNORMAL LOW (ref >60)
GFR calc non Af Amer: 24 mL/min — ABNORMAL LOW (ref >60)
GLUCOSE: 124 mg/dL — AB (ref 65–99)
Potassium: 3.9 mmol/L (ref 3.5–5.1)
SODIUM: 140 mmol/L (ref 135–145)

## 2017-01-25 LAB — CBC
HCT: 30.3 % — ABNORMAL LOW (ref 36.0–46.0)
Hemoglobin: 9.7 g/dL — ABNORMAL LOW (ref 12.0–15.0)
MCH: 29.6 pg (ref 26.0–34.0)
MCHC: 32 g/dL (ref 30.0–36.0)
MCV: 92.4 fL (ref 78.0–100.0)
PLATELETS: 118 10*3/uL — AB (ref 150–400)
RBC: 3.28 MIL/uL — ABNORMAL LOW (ref 3.87–5.11)
RDW: 17.2 % — AB (ref 11.5–15.5)
WBC: 8.5 10*3/uL (ref 4.0–10.5)

## 2017-01-25 LAB — PROTIME-INR
INR: 4.04
PROTHROMBIN TIME: 40.3 s — AB (ref 11.4–15.2)

## 2017-01-25 MED ORDER — PREDNISONE 20 MG PO TABS
20.0000 mg | ORAL_TABLET | Freq: Every day | ORAL | Status: DC
  Administered 2017-01-26: 20 mg via ORAL
  Filled 2017-01-25: qty 1

## 2017-01-25 MED ORDER — SODIUM CHLORIDE 0.9 % IV SOLN
INTRAVENOUS | Status: DC
  Administered 2017-01-25 (×2): via INTRAVENOUS

## 2017-01-25 NOTE — Progress Notes (Addendum)
ANTICOAGULATION CONSULT NOTE - Follow Up Consult  Pharmacy Consult for warfarin  Indication: Extensive bilateral DVTs, PE    Allergies  Allergen Reactions  . Proton Pump Inhibitors     Biopsy proven acute interstitial nephritis; required dialysis in 09/2016  . Protonix [Pantoprazole] Anaphylaxis  . Penicillins Rash and Other (See Comments)    Tolerates rocephin  . Sulfa Antibiotics Rash   Patient Measurements: Height: 5\' 2"  (157.5 cm) Weight: 215 lb 4.8 oz (97.7 kg) IBW/kg (Calculated) : 50.1  Vital Signs:    Labs:  Recent Labs  01/23/17 0303 01/24/17 0325 01/25/17 0316  HGB 9.4* 9.1* 9.7*  HCT 29.0* 28.2* 30.3*  PLT 97* 111* 118*  LABPROT 30.1* 36.6* 40.3*  INR 2.80 3.58 4.04*  CREATININE 2.63* 2.29* 2.12*    Estimated Creatinine Clearance: 30 mL/min (A) (by C-G formula based on SCr of 2.12 mg/dL (H)).  Assessment: 45 yof transitioned from heparin gtt to warfarin for extensive bilateral LE DVTs and high probability for PE per VQ scan. Heparin stopped on 5/16. INR supratherapeutic yesterday and increased more today to 4.04.  Appears to be in response to 7.5mg  dose on 5/18. CBC stable, no overt s/s bleeding noted.   Goal of Therapy:  INR 2-3 Monitor platelets by anticoagulation protocol: Yes   Plan:  - Hold Warfarin tonight - Daily INR - Monitor pltc and s/sx of bleeding  Nicole Cella, RPh Clinical Pharmacist Pager: [phone] Today until 4pm [phone] After hours: Main pharmacy [phone] 01/25/17 1:11 PM

## 2017-01-25 NOTE — Progress Notes (Signed)
PROGRESS NOTE    Rachael [name]  IRC:[id number] DOB: 09/20/1955 DOA: 01/16/2017 PCP: Ronita Hipps, MD    Brief Narrative:  61 y.o.femalewith medical history significant of CHF last EF 60-65% in 10/2016, HTN, hypothyroidism, CVA w/o residual deficits; who presents with acute onset of shortness of breath. Patient was just admitted into the hospital with acute renal failure from 3/21-3/29. In the ER patient was found to have hypoxia, possible sepsis. Chest x-ray clear. Also started on IV heparin for possible PE. Patient overlapped with heparin and Coumadin. INR now supratherapeutic. Patient also noted to have bouts of orthostatic hypotension which is improving on gentle hydration.    Assessment & Plan:   Principal Problem:   Sepsis ([place]) Active Problems:   Acute respiratory failure with hypoxia (HCC)   Pulmonary embolus (HCC)   Hypotension   Acute deep vein thrombosis (DVT) of both lower extremities (HCC)   Hypothyroidism   History of CVA (cerebrovascular accident)   Depression   CKD (chronic kidney disease), stage IV (HCC)   CHF exacerbation (HCC)   Elevated troponin   Orthostasis    RV dilation by echocardiogram in setting of PE   Supratherapeutic INR  #1 sepsis Concern for sepsis of unknown etiology. Blood cultures 1 bottle growing coag-negative staph likely a contaminant. Patient has improved clinically in terms of sepsis criteria. Patient currently afebrile. Normal white count. Patient status post 4 days of IV vancomycin. Vancomycin discontinued on 01/19/2017. Patient status post 5 days of Levaquin. Follow.  #2 acute respiratory failure with hypoxia likely secondary to PE Patient had presented with shortness of breath. Unable to get a CT scan of the chest with contrast due to patient's chronic kidney disease. Lower extremity Dopplers consistent with extensive bilateral DVT. VQ scan with high probability for PE. Patient s/p IV heparin bridge and Coumadin with goal INR 2-3. INR  is currently 4.04. IV heparin has been discontinued. Currently on Coumadin which are likely need to be held tonight secondary to supratherapeutic INR.Rachael [name] Patient currently on RA with sats of 94%. Patient noted to be hypotensive on 5/15 and 5/16 and when she got up to ambulate with physical therapy. Patient given a bolus 1 L normal saline with improvement with blood pressure.Initial concern that hypotension likely secondary to PE. 2-D echo with dilated right ventricle with normal EF. Patient status post course of antibiotic treatment. Patient seen in consultation by vascular surgery, Dr. Donzetta Matters who had recommended to continue anticoagulation for now no need for IVC filter.Place TED hose.  #3 hypotension/orthostatic hypotension Concern for hemodynamic instability likely secondary to PE. VQ scan with hypermobility of PE. Patient with bilateral DVTs s/p IV heparin bridge with Coumadin. Blood pressure improving however patient still orthostatic with symptoms of dizziness and lightheadedness limiting physical therapy. Orthostasis has improved with gentle hydration. TED hose were ordered early on in the hospitalization however patient currently did not have them on yesterday 01/24/2017. Patient now with TED hose on. Patient has been seen in consultation by critical care medicine to patient's orthostatic hypotension is not related to her PE as patient is not significantly hypoxic with hypotension. Continue gentle hydration.  Follow.   #4 bilateral lower extremity DVT/PE Noted on bilateral lower extremity Dopplers. Patient had presented with shortness of breath. VQ scan with high probability of PE. Patient currently s/p IV heparin bridge with Coumadin. Heparin has been discontinued. INR currently at 4.04 from 3.58. Goal INR 2-3. Patient has been seen in consultation by vascular surgery, Dr.Cain who recommended anticoagulation  and no need for IVC filter at this time and TED hose. INR increasing. May need to hold Coumadin  tonight. Coumadin per pharmacy.  #5 probable chronic kidney disease stage IV Patient with serum creatinine levels stabilized.  Patient status post kidney biopsy proven AIN after PPI use. Patient on a prednisone tapering dose. Decrease prednisone to 20 mg daily 1 week and then 10 mg daily 1 week. Follow renal function. Continue bicarbonate tablets. Outpatient follow-up.  #6 hypothyroidism TSH 4.120 on 01/16/2017. Continue home dose Synthroid.  #7 morbid obesity  #8 thrombocytopenia Likely in the setting of sepsis. No signs of bleeding. Platelets stable. Follow.  #9 supratherapeutic INR INR currently at 4.04 from 3.58 from 2.80. Patient with no bleeding. Will need to hold Coumadin tonight. Coumadin per pharmacy.   DVT prophylaxis: Coumadin/supratherapeutic INR Code Status: Full Family Communication: Updated patient. No family at bedside. Disposition Plan: Transfer to telemetry. Patient with bouts of hypotension/orthostasis. Likely skilled nursing facility once hypotension has resolved and supratherapeutic INR improved.    Consultants:   Vascular surgery: Dr. Donzetta Matters 01/16/2017  PCCM : Dr Nelda Marseille 01/23/2017  Procedures:  Lower extremity Dopplers 01/16/2017  Chest x-ray 01/16/2017  VQ scan 01/16/2017  2-D echo 01/18/2017  IVC/iliac duplex 01/16/2017  Antimicrobials:   IV vancomycin 01/16/2017>>>> 01/19/2017  Levaquin 01/16/2017>>>> 01/20/2017   Subjective: Patient sitting up in chair. No chest pain. No shortness of breath. No nausea. No vomiting. Dizziness and lightheadedness improving. No bleeding.   Objective: Vitals:   01/24/17 1006 01/24/17 1201 01/24/17 1502 01/25/17 0611  BP: 112/70 94/61 123/81   Pulse: 89 100 (!) 105   Resp: 13 15 (!) 21   Temp: 97.5 F (36.4 C) 98.1 F (36.7 C) 98.4 F (36.9 C)   TempSrc: Oral Oral    SpO2: 96% 97% 94%   Weight:    97.7 kg (215 lb 4.8 oz)  Height:        Intake/Output Summary (Last 24 hours) at 01/25/17 1146 Last  data filed at 01/25/17 0929  Gross per 24 hour  Intake          2423.25 ml  Output             1300 ml  Net          1123.25 ml   Filed Weights   01/24/17 0500 01/24/17 0731 01/25/17 0611  Weight: 117.2 kg (258 lb 6.1 oz) 117.2 kg (258 lb 6.1 oz) 97.7 kg (215 lb 4.8 oz)    Examination:  General exam: NAD Respiratory system: Clear to auscultation. Respiratory effort normal. Cardiovascular system: S1 & S2 heard, RRR. No JVD, murmurs, rubs, gallops or clicks. No pedal edema. Gastrointestinal system: Abdomen is nondistended, soft and nontender. No organomegaly or masses felt. Normal bowel sounds heard. Central nervous system: Alert and oriented. No focal neurological deficits. Extremities: Symmetric 5 x 5 power. Skin: No rashes, lesions or ulcers Psychiatry: Judgement and insight appear normal. Mood & affect appropriate.     Data Reviewed: I have personally reviewed following labs and imaging studies  CBC:  Recent Labs Lab 01/21/17 0332 01/22/17 0301 01/23/17 0303 01/24/17 0325 01/25/17 0316  WBC 7.2 6.4 7.6 8.1 8.5  HGB 9.8* 9.3* 9.4* 9.1* 9.7*  HCT 29.3* 28.6* 29.0* 28.2* 30.3*  MCV 91.0 91.7 90.9 91.3 92.4  PLT 95* 104* 97* 111* 540*   Basic Metabolic Panel:  Recent Labs Lab 01/21/17 0912 01/22/17 0301 01/23/17 0303 01/24/17 0325 01/25/17 0316  NA 137 137 137 138 140  K 3.9 3.9 4.5 4.1 3.9  CL 103 105 104 106 108  CO2 24 24 23 24 24   GLUCOSE 98 121* 180* 133* 124*  BUN 37* 38* 35* 32* 32*  CREATININE 2.76* 2.80* 2.63* 2.29* 2.12*  CALCIUM 9.0 8.8* 8.5* 8.4* 8.5*   GFR: Estimated Creatinine Clearance: 30 mL/min (A) (by C-G formula based on SCr of 2.12 mg/dL (H)). Liver Function Tests: No results for input(s): AST, ALT, ALKPHOS, BILITOT, PROT, ALBUMIN in the last 168 hours. No results for input(s): LIPASE, AMYLASE in the last 168 hours. No results for input(s): AMMONIA in the last 168 hours. Coagulation Profile:  Recent Labs Lab 01/21/17 0332  01/22/17 0301 01/23/17 0303 01/24/17 0325 01/25/17 0316  INR 2.60 2.95 2.80 3.58 4.04*   Cardiac Enzymes: No results for input(s): CKTOTAL, CKMB, CKMBINDEX, TROPONINI in the last 168 hours. BNP (last 3 results) No results for input(s): PROBNP in the last 8760 hours. HbA1C: No results for input(s): HGBA1C in the last 72 hours. CBG: No results for input(s): GLUCAP in the last 168 hours. Lipid Profile: No results for input(s): CHOL, HDL, LDLCALC, TRIG, CHOLHDL, LDLDIRECT in the last 72 hours. Thyroid Function Tests: No results for input(s): TSH, T4TOTAL, FREET4, T3FREE, THYROIDAB in the last 72 hours. Anemia Panel: No results for input(s): VITAMINB12, FOLATE, FERRITIN, TIBC, IRON, RETICCTPCT in the last 72 hours. Sepsis Labs: No results for input(s): PROCALCITON, LATICACIDVEN in the last 168 hours.  Recent Results (from the past 240 hour(s))  Blood Culture (routine x 2)     Status: None   Collection Time: 01/16/17  3:14 AM  Result Value Ref Range Status   Specimen Description BLOOD LEFT ANTECUBITAL  Final   Special Requests   Final    BOTTLES DRAWN AEROBIC AND ANAEROBIC Blood Culture adequate volume   Culture NO GROWTH 5 DAYS  Final   Report Status 01/21/2017 FINAL  Final  Blood Culture (routine x 2)     Status: Abnormal   Collection Time: 01/16/17  3:22 AM  Result Value Ref Range Status   Specimen Description BLOOD RIGHT HAND  Final   Special Requests IN PEDIATRIC BOTTLE Blood Culture adequate volume  Final   Culture  Setup Time   Final    GRAM POSITIVE COCCI IN CLUSTERS IN PEDIATRIC BOTTLE CRITICAL RESULT CALLED TO, READ BACK BY AND VERIFIED WITH: A MASTERS,PHARMD AT 0908 01/17/17 BY L BENFIELD    Culture (A)  Final    STAPHYLOCOCCUS SPECIES (COAGULASE NEGATIVE) THE SIGNIFICANCE OF ISOLATING THIS ORGANISM FROM A SINGLE SET OF BLOOD CULTURES WHEN MULTIPLE SETS ARE DRAWN IS UNCERTAIN. PLEASE NOTIFY THE MICROBIOLOGY DEPARTMENT WITHIN ONE WEEK IF SPECIATION AND SENSITIVITIES  ARE REQUIRED.    Report Status 01/18/2017 FINAL  Final  Blood Culture ID Panel (Reflexed)     Status: Abnormal   Collection Time: 01/16/17  3:22 AM  Result Value Ref Range Status   Enterococcus species NOT DETECTED NOT DETECTED Final   Listeria monocytogenes NOT DETECTED NOT DETECTED Final   Staphylococcus species DETECTED (A) NOT DETECTED Final    Comment: Methicillin (oxacillin) susceptible coagulase negative staphylococcus. Possible blood culture contaminant (unless isolated from more than one blood culture draw or clinical case suggests pathogenicity). No antibiotic treatment is indicated for blood  culture contaminants. CRITICAL RESULT CALLED TO, READ BACK BY AND VERIFIED WITH: A MASTERS,PHARMD AT 0908 01/17/17 BY L BENFIELD    Staphylococcus aureus NOT DETECTED NOT DETECTED Final   Methicillin resistance NOT DETECTED NOT DETECTED Final  Streptococcus species NOT DETECTED NOT DETECTED Final   Streptococcus agalactiae NOT DETECTED NOT DETECTED Final   Streptococcus pneumoniae NOT DETECTED NOT DETECTED Final   Streptococcus pyogenes NOT DETECTED NOT DETECTED Final   Acinetobacter baumannii NOT DETECTED NOT DETECTED Final   Enterobacteriaceae species NOT DETECTED NOT DETECTED Final   Enterobacter cloacae complex NOT DETECTED NOT DETECTED Final   Escherichia coli NOT DETECTED NOT DETECTED Final   Klebsiella oxytoca NOT DETECTED NOT DETECTED Final   Klebsiella pneumoniae NOT DETECTED NOT DETECTED Final   Proteus species NOT DETECTED NOT DETECTED Final   Serratia marcescens NOT DETECTED NOT DETECTED Final   Haemophilus influenzae NOT DETECTED NOT DETECTED Final   Neisseria meningitidis NOT DETECTED NOT DETECTED Final   Pseudomonas aeruginosa NOT DETECTED NOT DETECTED Final   Candida albicans NOT DETECTED NOT DETECTED Final   Candida glabrata NOT DETECTED NOT DETECTED Final   Candida krusei NOT DETECTED NOT DETECTED Final   Candida parapsilosis NOT DETECTED NOT DETECTED Final    Candida tropicalis NOT DETECTED NOT DETECTED Final  MRSA PCR Screening     Status: None   Collection Time: 01/16/17  7:04 AM  Result Value Ref Range Status   MRSA by PCR NEGATIVE NEGATIVE Final    Comment:        The GeneXpert MRSA Assay (FDA approved for NASAL specimens only), is one component of a comprehensive MRSA colonization surveillance program. It is not intended to diagnose MRSA infection nor to guide or monitor treatment for MRSA infections.          Radiology Studies: No results found.      Scheduled Meds: . citalopram  20 mg Oral Daily  . levothyroxine  88 mcg Oral QAC breakfast  . mouth rinse  15 mL Mouth Rinse BID  . predniSONE  30 mg Oral Q breakfast  . sodium bicarbonate  650 mg Oral BID  . sodium chloride flush  3 mL Intravenous Q12H  . Warfarin - Pharmacist Dosing Inpatient   Does not apply q1800   Continuous Infusions: . sodium chloride    . sodium chloride 75 mL/hr at 01/25/17 0929     LOS: 9 days    Time spent: [place], MD [facility] Pager [phone]  If 7PM-7AM, please contact night-coverage [email or web address] Password [email or web address] 01/25/2017, 11:46 AM

## 2017-01-25 NOTE — Progress Notes (Signed)
Lab called with a critical lab value on her INR. INR was 4.04. I texted paged Dr. Grandville Silos to make him aware.

## 2017-01-26 DIAGNOSIS — R748 Abnormal levels of other serum enzymes: Secondary | ICD-10-CM | POA: Diagnosis not present

## 2017-01-26 DIAGNOSIS — I951 Orthostatic hypotension: Secondary | ICD-10-CM | POA: Diagnosis not present

## 2017-01-26 DIAGNOSIS — J9601 Acute respiratory failure with hypoxia: Secondary | ICD-10-CM | POA: Diagnosis not present

## 2017-01-26 DIAGNOSIS — I10 Essential (primary) hypertension: Secondary | ICD-10-CM | POA: Diagnosis not present

## 2017-01-26 DIAGNOSIS — Z8673 Personal history of transient ischemic attack (TIA), and cerebral infarction without residual deficits: Secondary | ICD-10-CM | POA: Diagnosis not present

## 2017-01-26 DIAGNOSIS — J189 Pneumonia, unspecified organism: Secondary | ICD-10-CM | POA: Diagnosis not present

## 2017-01-26 DIAGNOSIS — R2689 Other abnormalities of gait and mobility: Secondary | ICD-10-CM | POA: Diagnosis not present

## 2017-01-26 DIAGNOSIS — I82503 Chronic embolism and thrombosis of unspecified deep veins of lower extremity, bilateral: Secondary | ICD-10-CM | POA: Diagnosis not present

## 2017-01-26 DIAGNOSIS — R609 Edema, unspecified: Secondary | ICD-10-CM | POA: Diagnosis not present

## 2017-01-26 DIAGNOSIS — F320 Major depressive disorder, single episode, mild: Secondary | ICD-10-CM | POA: Diagnosis not present

## 2017-01-26 DIAGNOSIS — N184 Chronic kidney disease, stage 4 (severe): Secondary | ICD-10-CM | POA: Diagnosis not present

## 2017-01-26 DIAGNOSIS — E669 Obesity, unspecified: Secondary | ICD-10-CM | POA: Diagnosis not present

## 2017-01-26 DIAGNOSIS — I82409 Acute embolism and thrombosis of unspecified deep veins of unspecified lower extremity: Secondary | ICD-10-CM | POA: Diagnosis not present

## 2017-01-26 DIAGNOSIS — R6520 Severe sepsis without septic shock: Secondary | ICD-10-CM | POA: Diagnosis not present

## 2017-01-26 DIAGNOSIS — N10 Acute tubulo-interstitial nephritis: Secondary | ICD-10-CM | POA: Diagnosis not present

## 2017-01-26 DIAGNOSIS — D631 Anemia in chronic kidney disease: Secondary | ICD-10-CM | POA: Diagnosis not present

## 2017-01-26 DIAGNOSIS — I2699 Other pulmonary embolism without acute cor pulmonale: Secondary | ICD-10-CM | POA: Diagnosis not present

## 2017-01-26 DIAGNOSIS — I509 Heart failure, unspecified: Secondary | ICD-10-CM | POA: Diagnosis not present

## 2017-01-26 DIAGNOSIS — E039 Hypothyroidism, unspecified: Secondary | ICD-10-CM | POA: Diagnosis not present

## 2017-01-26 DIAGNOSIS — I744 Embolism and thrombosis of arteries of extremities, unspecified: Secondary | ICD-10-CM | POA: Diagnosis not present

## 2017-01-26 DIAGNOSIS — A419 Sepsis, unspecified organism: Secondary | ICD-10-CM | POA: Diagnosis not present

## 2017-01-26 DIAGNOSIS — I824Y3 Acute embolism and thrombosis of unspecified deep veins of proximal lower extremity, bilateral: Secondary | ICD-10-CM | POA: Diagnosis not present

## 2017-01-26 DIAGNOSIS — J810 Acute pulmonary edema: Secondary | ICD-10-CM | POA: Diagnosis not present

## 2017-01-26 DIAGNOSIS — M6281 Muscle weakness (generalized): Secondary | ICD-10-CM | POA: Diagnosis not present

## 2017-01-26 DIAGNOSIS — R0602 Shortness of breath: Secondary | ICD-10-CM

## 2017-01-26 DIAGNOSIS — I129 Hypertensive chronic kidney disease with stage 1 through stage 4 chronic kidney disease, or unspecified chronic kidney disease: Secondary | ICD-10-CM | POA: Diagnosis not present

## 2017-01-26 LAB — CBC
HCT: 30.6 % — ABNORMAL LOW (ref 36.0–46.0)
Hemoglobin: 9.8 g/dL — ABNORMAL LOW (ref 12.0–15.0)
MCH: 29.4 pg (ref 26.0–34.0)
MCHC: 32 g/dL (ref 30.0–36.0)
MCV: 91.9 fL (ref 78.0–100.0)
Platelets: 124 10*3/uL — ABNORMAL LOW (ref 150–400)
RBC: 3.33 MIL/uL — AB (ref 3.87–5.11)
RDW: 17 % — ABNORMAL HIGH (ref 11.5–15.5)
WBC: 7.6 10*3/uL (ref 4.0–10.5)

## 2017-01-26 LAB — PROTIME-INR
INR: 3.03
Prothrombin Time: 32 seconds — ABNORMAL HIGH (ref 11.4–15.2)

## 2017-01-26 LAB — BASIC METABOLIC PANEL
ANION GAP: 9 (ref 5–15)
BUN: 26 mg/dL — ABNORMAL HIGH (ref 6–20)
CALCIUM: 8.4 mg/dL — AB (ref 8.9–10.3)
CO2: 23 mmol/L (ref 22–32)
Chloride: 109 mmol/L (ref 101–111)
Creatinine, Ser: 1.92 mg/dL — ABNORMAL HIGH (ref 0.44–1.00)
GFR calc non Af Amer: 27 mL/min — ABNORMAL LOW (ref >60)
GFR, EST AFRICAN AMERICAN: 31 mL/min — AB (ref >60)
GLUCOSE: 84 mg/dL (ref 65–99)
POTASSIUM: 3.3 mmol/L — AB (ref 3.5–5.1)
Sodium: 141 mmol/L (ref 135–145)

## 2017-01-26 MED ORDER — PREDNISONE 10 MG PO TABS: 10.0000 mg | ORAL_TABLET | Freq: Every day | ORAL | 0 refills | Status: DC

## 2017-01-26 MED ORDER — POTASSIUM CHLORIDE CRYS ER 20 MEQ PO TBCR
40.0000 meq | EXTENDED_RELEASE_TABLET | Freq: Once | ORAL | Status: AC
  Administered 2017-01-26: 40 meq via ORAL
  Filled 2017-01-26: qty 2

## 2017-01-26 MED ORDER — SODIUM BICARBONATE 650 MG PO TABS: 650.0000 mg | ORAL_TABLET | Freq: Two times a day (BID) | ORAL | 0 refills | Status: DC

## 2017-01-26 MED ORDER — WARFARIN SODIUM 5 MG PO TABS: 5.0000 mg | ORAL_TABLET | Freq: Every day | ORAL | 0 refills | Status: DC

## 2017-01-26 MED ORDER — SODIUM BICARBONATE 650 MG PO TABS: 650.0000 mg | ORAL_TABLET | Freq: Every day | ORAL | 0 refills | Status: DC

## 2017-01-26 NOTE — Care Management Note (Signed)
Case Management Note  Patient Details  Name: Rachael [name] MRN: [id number] Date of Birth: Aug 09, 1956  Subjective/Objective:       Sepsis, acute resp failure with hypoxia, PE, Hypotension             Action/Plan: Discharge Planning: Chart reviewed. CSW following for SNF placement. Scheduled dc today to SNF-rehab.   PCP Ronita Hipps MD   Expected Discharge Date:  01/26/17               Expected Discharge Plan:  [place]  In-House Referral:  Clinical Social Work  Discharge planning Services  CM Consult  Post Acute Care Choice:  NA Choice offered to:  NA  DME Arranged:  N/A DME Agency:  NA  HH Arranged:  NA HH Agency:  NA  Status of Service:  Completed, signed off  If discussed at [employer] of Stay Meetings, dates discussed:    Additional Comments:  Erenest Rasher, RN 01/26/2017, 3:36 PM

## 2017-01-26 NOTE — Discharge Summary (Addendum)
Physician Discharge Summary  Rachael [name] QAS:[id number] DOB: 03-May-1956 DOA: 01/16/2017  PCP: Ronita Hipps, MD  Admit date: 01/16/2017 Discharge date: 01/26/2017  Time spent: 60 minutes  Recommendations for Outpatient Follow-up:  1. Follow-up with M.D. at the skilled nursing facility. Patient need a basic metabolic profile done in one week post discharge. Patient need a PT/INR checked on Friday, 01/30/2017. Further adjustments in terms of anticoagulation will need to be made at that time. 2. Follow-up with Dr. Hollie Salk, nephrology in 1-2 weeks.   Discharge Diagnoses:  Principal Problem:   Sepsis ([place]) Active Problems:   Acute respiratory failure with hypoxia (HCC)   Pulmonary embolus (HCC)   Hypotension   Acute deep vein thrombosis (DVT) of both lower extremities (HCC)   Hypothyroidism   History of CVA (cerebrovascular accident)   Depression   CKD (chronic kidney disease), stage IV (HCC)   Chronic diastolic CHF (HCC)   Elevated troponin   Orthostasis    RV dilation by echocardiogram in setting of PE   Supratherapeutic INR   Community acquired pneumonia   Discharge Condition: Stable and improved.  Diet recommendation: Heart healthy  Filed Weights   01/24/17 0731 01/25/17 0611 01/26/17 0647  Weight: 117.2 kg (258 lb 6.1 oz) 97.7 kg (215 lb 4.8 oz) 99.9 kg (220 lb 4.8 oz)    History of present illness:  Per Dr Roosevelt Locks Fringer is a 61 y.o. female with medical history significant of chronic diastolic CHF last EF 79-89% in 10/2016, HTN, hypothyroidism, CVA w/o residual deficits; who presented with acute onset of shortness of breath. Patient was just admitted into the hospital with acute renal failure from 3/21-3/29. Upon review of records it appearred the patient was to be discharged home on  Prednisone 60 mg, Lasix 40 mg , and sodium bicarbonate tablets and to follow-up with Dr. Hollie Salk of nephrology. However, patient notes that she is normally in prescriptions for Lasix or sodium  bicarbonate tablets. When she followed up with Dr. Hollie Salk she was told there was no need for the Lasix. She has been on prednisone and then decreasing by 10 mg every 2 weeks currently on 30 mg. Patient noted since being home she has had shortness of breath with exertion. Walking even 15 feet makes her significantly tired and fatigued. At baseline patient is not on oxygen at home. In the last 2 nights at least patient noted that she had woken up with a nonproductive cough unable to catch her breath. Since being home she reports associated symptoms of sleeping on increased number of pillows, generalized whole body swelling(patient attributed symptoms to prednisone), and has had intermittent night sweats. Denied having any chest pain, vomiting, dysuria, flank pain, loss of consciousness, focal weakness, or change in vision.    ED Course: Once EMS arrived patient's O2 saturations were noted to be in the 80s on room air. To place on a nonrebreather with some relief of symptoms. Sepsis protocol was initiated given vital signs, WBC 12.4, and lactic acid 2.68. Patient was placed on empiric antibiotics of Levaquin and vancomycin as there was initially concern for a respiratory etiology. Patient's BMP was elevated at 496.3 and troponin 0.08. EKG showed no significant signs of ischemia. Chest x-ray  was noted to be clear. Patient was subsequently placed on heparin drip for concern of possible pulmonary embolus. Urinalysis was not back at this time.  Hospital Course:  #1 sepsis Concern for sepsis of unknown etiology. Blood cultures 1 bottle growing coag-negative staph likely  a contaminant. Patient improved clinically in terms of sepsis criteria. Patient remained afebrile. Or with a normal white count. Normal white count. Patient status post 4 days of IV vancomycin. Vancomycin discontinued on 01/19/2017. Patient status post 5 days of Levaquin.   #2 acute respiratory failure with hypoxia likely secondary to  PE Patient had presented with shortness of breath. Unable to get a CT scan of the chest with contrast due to patient's chronic kidney disease. Lower extremity Dopplers consistent with extensive bilateral DVT. VQ scan with high probability for PE. Patient s/p IV heparin bridge and Coumadin with goal INR 2-3. INR increased as has 4.04. Coumadin had to be held. By day of discharge INR was down to 3.03. IV heparin has been discontinued. Patient currently on RA with sats of 94%. Patient noted to be hypotensive on 5/15 and 5/16 and when she got up to ambulate with physical therapy. Patient given a bolus 1 L normal saline with improvement with blood pressure.Initial concern that hypotension likely secondary to PE. 2-D echo with dilated right ventricle with normal EF. Patient status post course of antibiotic treatment. Patient seen in consultation by vascular surgery, Dr. Donzetta Matters who had recommended to continue anticoagulation for now no need for IVC filter.Placed TED hose. Patient's hypoxia improved such that by day of discharge O2 sats were 93% on room air.  #3 hypotension/orthostatic hypotension During the hospitalization patient noted to have bouts of orthostatic hypotension. Concern for hemodynamic instability likely secondary to PE. VQ scan with hypermobility of PE. Patient with bilateral DVTs s/p IV heparin bridge with Coumadin. Blood pressure improving however patient still orthostatic with symptoms of dizziness and lightheadedness limiting physical therapy. Orthostasis has improved with gentle hydration. TED hose were ordered early on in the hospitalization however patient currently did not have them on yesterday 01/24/2017. Patient now with TED hose on. Patient has been seen in consultation by critical care medicine to patient's orthostatic hypotension is not related to her PE as patient is not significantly hypoxic with hypotension. Continue gentle hydration.  Follow.   #4 bilateral lower extremity  DVT/PE Noted on bilateral lower extremity Dopplers. Patient had presented with shortness of breath. VQ scan with high probability of PE. Patient was placed on IV heparin and then subsequently started on Coumadin. IV heparin was bridge with Coumadin for 5 days and IV heparin subsequently discontinued. Patient's INR started to climb up as high as 4.04 and then subsequently chanted down to 3.03 by day of discharge. Patient was seen in consultation by vascular surgery, Dr.Cain who recommended anticoagulation and no need for IVC filter at this time and TED hose. Patient will be discharged on Coumadin 5 mg daily to start 01/27/2017. Patient will need PT/INR checked on Friday, 01/30/2017.    #5 probable chronic kidney disease stage IV Patient with serum creatinine levels stabilized.  Patient status post kidney biopsy proven AIN after PPI use. Patient on a prednisone tapering dose. Patient's prednisone was decreased to 20 mg daily and per admission H&P patient was to decrease prednisone by 10 mg every 2 weeks. Patient will be discharged on prednisone 20 mg daily for the next 12 days and then 10 mg daily for 2 weeks. Patient's renal function improved during the hospitalization such that by day of discharge the creatinine was down to 1.92. Patient was maintained on bicarbonate tablets. Patient is to follow-up with Dr. Hollie Salk 1-2 weeks postdischarge.   #6 hypothyroidism TSH 4.120 on 01/16/2017. Patient maintained on home dose Synthroid.  #7 morbid obesity  #  8 thrombocytopenia Likely in the setting of sepsis. No signs of bleeding. Platelets improved such that by day of discharge platelet count was up to 124. Outpatient follow-up.   #9 supratherapeutic INR Patient was anticoagulated for bilateral DVT and PE with a heparin bridge. Heparin was subsequently discontinued after 5 day overlap. Patient's INR subsequently continued to rise as high as 4.04 and a start to trend back down to 3.03. Patient will be  discharged to skilled nursing facility on Coumadin 5 mg daily to start 01/27/2017 and will need PT/INR checked on Friday, 01/30/2017.    Procedures:  Lower extremity Dopplers 01/16/2017  Chest x-ray 01/16/2017  VQ scan 01/16/2017  2-D echo 01/18/2017  IVC/iliac duplex 01/16/2017  Consultations:  Vascular surgery: Dr. Donzetta Matters 01/16/2017  PCCM : Dr Nelda Marseille 01/23/2017  Discharge Exam: Vitals:   01/26/17 0556 01/26/17 1414  BP: 140/86 114/75  Pulse: 90 (!) 110  Resp: 18 18  Temp: 98.1 F (36.7 C) 98.3 F (36.8 C)    General: NAD Cardiovascular: RRR Respiratory: CTAB  Discharge Instructions   Discharge Instructions    Diet - low sodium heart healthy    Complete by:  As directed    Increase activity slowly    Complete by:  As directed      Current Discharge Medication List    START taking these medications   Details  sodium bicarbonate 650 MG tablet Take 1 tablet (650 mg total) by mouth 2 (two) times daily. Qty: 30 tablet, Refills: 0    warfarin (COUMADIN) 5 MG tablet Take 1 tablet (5 mg total) by mouth daily. Qty: 30 tablet, Refills: 0      CONTINUE these medications which have CHANGED   Details  predniSONE (DELTASONE) 10 MG tablet Take 1-2 tablets (10-20 mg total) by mouth daily with breakfast. Take 2 tablets (20mg ) daily x 12 days, then 1 tablet (10mg ) daily x 14 days then stop. Qty: 38 tablet, Refills: 0      CONTINUE these medications which have NOT CHANGED   Details  citalopram (CELEXA) 20 MG tablet Take 1 tablet (20 mg total) by mouth daily. Qty: 30 tablet, Refills: 0    famotidine (PEPCID) 20 MG tablet Take 1 tablet (20 mg total) by mouth 2 (two) times daily. Qty: 60 tablet, Refills: 0    levothyroxine (SYNTHROID, LEVOTHROID) 88 MCG tablet Take 1 tablet (88 mcg total) by mouth daily before breakfast. Qty: 30 tablet, Refills: 1    metoCLOPramide (REGLAN) 5 MG tablet Take 1 tablet (5 mg total) by mouth 3 (three) times daily at 8am, 3pm and  bedtime. Qty: 90 tablet, Refills: 0       Allergies  Allergen Reactions  . Proton Pump Inhibitors     Biopsy proven acute interstitial nephritis; required dialysis in 09/2016  . Protonix [Pantoprazole] Anaphylaxis  . Penicillins Rash and Other (See Comments)    Tolerates rocephin  . Sulfa Antibiotics Rash   Follow-up Information    MD AT SNF Follow up.        INR CHECK Follow up on 01/30/2017.   Why:  INR CHECK ON Reatha Armour, MD. Schedule an appointment as soon as possible for a visit in 1 week(s).   Specialty:  Nephrology Why:  F/U IN 1-2 WEEKS. Contact information: [place] [address] [phone]            The results of significant diagnostics from this hospitalization (including imaging, microbiology, ancillary and  laboratory) are listed below for reference.    Significant Diagnostic Studies: Nm Pulmonary Perf And Vent  Result Date: 01/16/2017 CLINICAL DATA:  Shortness of breath.  Hypoxia. EXAM: NUCLEAR MEDICINE VENTILATION - PERFUSION LUNG SCAN TECHNIQUE: Ventilation images were obtained in multiple projections using inhaled aerosol Tc-85m DTPA. Perfusion images were obtained in multiple projections after intravenous injection of Tc-79m MAA. RADIOPHARMACEUTICALS:  30.1 mCi Technetium-39m DTPA aerosol inhalation and 4.11 mCi Technetium-69m MAA IV COMPARISON:  None. FINDINGS: Ventilation: There is clumping of DTPA at the hila bilaterally, more prominent on the right. No ventilation defects are present otherwise. Perfusion: There is a segmental defect involving the superior segment of the right lower lobe additional subsegmental defects are present anteriorly and inferiorly in the right lower lobe. There are moderate segmental defects involving the superior segment of the left lower lobe hand the basilar segments. IMPRESSION: High probability for pulmonary embolus. Electronically Signed   By: San Morelle M.D.   On: 01/16/2017 16:10    Dg Chest Port 1 View  Result Date: 01/16/2017 CLINICAL DATA:  Hypoxia.  Sudden onset of shortness of breath. EXAM: PORTABLE CHEST 1 VIEW COMPARISON:  Frontal and lateral views 11/26/2016 FINDINGS: The cardiomediastinal contours are normal. Small hiatal hernia. The lungs are clear. Pulmonary vasculature is normal. No consolidation, pleural effusion, or pneumothorax. No acute osseous abnormalities are seen. IMPRESSION: No acute abnormality. Electronically Signed   By: Jeb Levering M.D.   On: 01/16/2017 01:39    Microbiology: No results found for this or any previous visit (from the past 240 hour(s)).   Labs: Basic Metabolic Panel:  Recent Labs Lab 01/22/17 0301 01/23/17 0303 01/24/17 0325 01/25/17 0316 01/26/17 0658  NA 137 137 138 140 141  K 3.9 4.5 4.1 3.9 3.3*  CL 105 104 106 108 109  CO2 24 23 24 24 23   GLUCOSE 121* 180* 133* 124* 84  BUN 38* 35* 32* 32* 26*  CREATININE 2.80* 2.63* 2.29* 2.12* 1.92*  CALCIUM 8.8* 8.5* 8.4* 8.5* 8.4*   Liver Function Tests: No results for input(s): AST, ALT, ALKPHOS, BILITOT, PROT, ALBUMIN in the last 168 hours. No results for input(s): LIPASE, AMYLASE in the last 168 hours. No results for input(s): AMMONIA in the last 168 hours. CBC:  Recent Labs Lab 01/22/17 0301 01/23/17 0303 01/24/17 0325 01/25/17 0316 01/26/17 0658  WBC 6.4 7.6 8.1 8.5 7.6  HGB 9.3* 9.4* 9.1* 9.7* 9.8*  HCT 28.6* 29.0* 28.2* 30.3* 30.6*  MCV 91.7 90.9 91.3 92.4 91.9  PLT 104* 97* 111* 118* 124*   Cardiac Enzymes: No results for input(s): CKTOTAL, CKMB, CKMBINDEX, TROPONINI in the last 168 hours. BNP: BNP (last 3 results)  Recent Labs  01/16/17 0139  BNP 496.3*    ProBNP (last 3 results) No results for input(s): PROBNP in the last 8760 hours.  CBG: No results for input(s): GLUCAP in the last 168 hours.     SignedIrine Seal MD.  [facility] 01/26/2017, 3:34 PM

## 2017-01-26 NOTE — Consult Note (Signed)
   [facility] Inpatient Consult   01/26/2017  Rachael [name] [place] 02-26-1956 [id number]   Patient reviewed for admissions in the [place].  Patient with a HX of HF and HX of CVA.  Patient is being recommended for skilled nursing for her disposition noted to [facility] facility.   For questions, please contact:  Natividad Brood, RN BSN [facility] Liaison  [phone] business mobile phone Toll free office [phone]

## 2017-01-26 NOTE — Progress Notes (Signed)
Occupational Therapy Treatment Patient Details Name: Rachael [name] MRN: [id number] DOB: [birth year] Today's Date: 01/26/2017    History of present illness 61 y.o. female with medical history significant of CHF last EF 60-65% in 10/2016, HTN, hypothyroidism, CVA w/o residual deficits; who presents with acute onset of shortness of breath. Patient was just admitted into the hospital with acute renal failure from 3/21-3/29.  Bilateral lower extremity Doppler consistent with extensive DVT bilateral.  The V/Q scan consistent with high probability for PE   OT comments  Pt progressing towards goals, completing stand pivot transfers with MinGuard Assist and LB ADLs with ModA this session, limited mostly by SOB and decreased activity tolerance. Pt will benefit from continued OT services in acute and post acute settings to maximize endurance, safety, and independence with ADLs and functional mobility. POC remains appropriate. Will continue to follow.    Follow Up Recommendations  SNF;Supervision/Assistance - 24 hour    Equipment Recommendations  Other (comment) (TBD at next venue )          Precautions / Restrictions Precautions Precautions: Fall Restrictions Weight Bearing Restrictions: No       Mobility Bed Mobility Overal bed mobility: Needs Assistance Bed Mobility: Supine to Sit;Sit to Supine     Supine to sit: Supervision;HOB elevated Sit to supine: Supervision;HOB elevated   General bed mobility comments: supervision for safety   Transfers Overall transfer level: Needs assistance Equipment used: None Transfers: Sit to/from [employer] Transfers Sit to Stand: Supervision Stand pivot transfers: Min guard       General transfer comment: close guard for safety     Balance Overall balance assessment: Needs assistance Sitting-balance support: Feet supported Sitting balance-Leahy Scale: Good     Standing balance support: No upper extremity supported Standing  balance-Leahy Scale: Fair Standing balance comment: during stand pivot transfer                            ADL either performed or assessed with clinical judgement   ADL Overall ADL's : Needs assistance/impaired Eating/Feeding: Independent;Sitting   Grooming: Wash/dry hands;Set up;Bed level           Upper Body Dressing : Set up;Sitting Upper Body Dressing Details (indicate cue type and reason): donning overhead shirt  Lower Body Dressing: Moderate assistance;Sit to/from stand Lower Body Dressing Details (indicate cue type and reason): donning underwear, shorts  Toilet Transfer: Min guard;Stand-pivot;BSC   Toileting- Clothing Manipulation and Hygiene: Min guard;Sit to/from stand       Functional mobility during ADLs: Min guard (stand pivot transfer ) General ADL Comments: Pt completed bed mobility, stand pivot transfers, toileting, UB and LB dressing, and seated grooming ADLs; increased SOB noted during task completion, symptoms subsiding with rest breaks                       Cognition Arousal/Alertness: Awake/alert Behavior During Therapy: WFL for tasks assessed/performed Overall Cognitive Status: Within Functional Limits for tasks assessed                                                      General Comments      Pertinent Vitals/ Pain       Pain Assessment: Faces Faces Pain Scale: No hurt  Frequency  Min 2X/week        Progress Toward Goals  OT Goals(current goals can now be found in the care plan section)  Progress towards OT goals: Progressing toward goals  Acute Rehab OT Goals Patient Stated Goal: To return to independent OT Goal Formulation: With patient Time For Goal Achievement: 02/03/17 Potential to Achieve Goals: Good  Plan Discharge plan remains appropriate                    AM-PAC PT "6 Clicks" Daily Activity      Outcome Measure   Help from another person eating meals?: None Help from another person taking care of personal grooming?: A Little Help from another person toileting, which includes using toliet, bedpan, or urinal?: A Little Help from another person bathing (including washing, rinsing, drying)?: A Lot Help from another person to put on and taking off regular upper body clothing?: A Little Help from another person to put on and taking off regular lower body clothing?: A Lot 6 Click Score: 17    End of Session    OT Visit Diagnosis: Unsteadiness on feet (R26.81);Muscle weakness (generalized) (M62.81)   Activity Tolerance Patient tolerated treatment well   Patient Left in bed;with call bell/phone within reach   Nurse Communication  (RN present in room at start of session)        Time: [phone] OT Time Calculation (min): 16 min  Charges: OT General Charges $OT Visit: 1 Procedure OT Treatments $Self Care/Home Management : 8-22 mins  Lou Cal, OT Pager [phone] 01/26/2017   Rachael [name] 01/26/2017, 4:57 PM

## 2017-01-26 NOTE — Progress Notes (Signed)
ANTICOAGULATION CONSULT NOTE - Follow Up Consult  Pharmacy Consult for warfarin  Indication: Extensive bilateral DVTs, PE    Allergies  Allergen Reactions  . Proton Pump Inhibitors     Biopsy proven acute interstitial nephritis; required dialysis in 09/2016  . Protonix [Pantoprazole] Anaphylaxis  . Penicillins Rash and Other (See Comments)    Tolerates rocephin  . Sulfa Antibiotics Rash   Patient Measurements: Height: 5\' 2"  (157.5 cm) Weight: 220 lb 4.8 oz (99.9 kg) IBW/kg (Calculated) : 50.1  Vital Signs: Temp: 98.1 F (36.7 C) (05/21 0556) Temp Source: Oral (05/21 0556) BP: 140/86 (05/21 0556) Pulse Rate: 90 (05/21 0556)  Labs:  Recent Labs  01/24/17 0325 01/25/17 0316 01/26/17 0658  HGB 9.1* 9.7* 9.8*  HCT 28.2* 30.3* 30.6*  PLT 111* 118* 124*  LABPROT 36.6* 40.3* 32.0*  INR 3.58 4.04* 3.03  CREATININE 2.29* 2.12* 1.92*    Estimated Creatinine Clearance: 33.6 mL/min (A) (by C-G formula based on SCr of 1.92 mg/dL (H)).  Assessment: 61 yo F transitioned from heparin gtt to warfarin for extensive bilateral LE DVTs and high probability for PE per VQ scan. Heparin stopped on 5/16. INR supratherapeutic but trending down.  Noted plans for possible discharge today.  Goal of Therapy:  INR 2-3 Monitor platelets by anticoagulation protocol: Yes   Plan:  - Hold Warfarin tonight - If the patient is discharged, would recommend Warfarin 5mg  PO daily starting 5/22 with next INR check 5/25. - Daily INR while inpatient - Monitor pltc and s/sx of bleeding  [employer], Pharm.D., BCPS Clinical Pharmacist Pager: [phone] Clinical phone for 01/26/2017 from 8:30-4:00 is [phone]. After 4pm, please call Main Rx [phone]) for assistance. 01/26/2017 1:29 PM

## 2017-01-26 NOTE — Progress Notes (Signed)
Nsg Discharge Note  Admit Date:  01/16/2017 Discharge date: 01/26/2017   Illa Level [place] to be D/C'd Rehab per MD order.  AVS completed.  Copy for chart, and copy for patient signed, and dated. Patient/caregiver able to verbalize understanding.  Discharge Medication: Allergies as of 01/26/2017      Reactions   Proton Pump Inhibitors    Biopsy proven acute interstitial nephritis; required dialysis in 09/2016   Protonix [pantoprazole] Anaphylaxis   Penicillins Rash, Other (See Comments)   Tolerates rocephin   Sulfa Antibiotics Rash      Medication List    TAKE these medications   citalopram 20 MG tablet Commonly known as:  CELEXA Take 1 tablet (20 mg total) by mouth daily.   famotidine 20 MG tablet Commonly known as:  PEPCID Take 1 tablet (20 mg total) by mouth 2 (two) times daily. What changed:  when to take this  reasons to take this Notes to patient:  01/26/2017 at 1800   levothyroxine 88 MCG tablet Commonly known as:  SYNTHROID, LEVOTHROID Take 1 tablet (88 mcg total) by mouth daily before breakfast. Notes to patient:  01/27/2017   metoCLOPramide 5 MG tablet Commonly known as:  REGLAN Take 1 tablet (5 mg total) by mouth 3 (three) times daily at 8am, 3pm and bedtime. What changed:  when to take this  reasons to take this Notes to patient:  01/26/2017 at bedtime   predniSONE 10 MG tablet Commonly known as:  DELTASONE Take 1-2 tablets (10-20 mg total) by mouth daily with breakfast. Take 2 tablets (20mg ) daily x 12 days, then 1 tablet (10mg ) daily x 14 days then stop. Start taking on:  01/27/2017 What changed:  medication strength  how much to take  additional instructions Notes to patient:  01/27/2017   sodium bicarbonate 650 MG tablet Take 1 tablet (650 mg total) by mouth 2 (two) times daily. Notes to patient:  01/26/2017   warfarin 5 MG tablet Commonly known as:  COUMADIN Take 1 tablet (5 mg total) by mouth daily. Start taking on:  01/27/2017 Notes to  patient:  01/27/2017       Discharge Assessment: Vitals:   01/26/17 1414 01/26/17 1603  BP: 114/75 (!) 143/78  Pulse: (!) 110 100  Resp: 18 20  Temp: 98.3 F (36.8 C) 98.9 F (37.2 C)   Patient has skin tear on LFA and scattered bruises. IV catheter discontinued intact. Site without signs and symptoms of complications - no redness or edema noted at insertion site, patient denies c/o pain - only slight tenderness at site.  Dressing with slight pressure applied.  D/c Instructions-Education: Discharge instructions given to patient/family with verbalized understanding. D/c education completed with patient/family including follow up instructions, medication list, d/c activities limitations if indicated, with other d/c instructions as indicated by MD - patient able to verbalize understanding, all questions fully answered. Patient instructed to return to ED, call 911, or call MD for any changes in condition.  Patient escorted via [place], and D/C home via private auto.  Earnstine Meinders Margaretha Sheffield, RN 01/26/2017 8:09 PM

## 2017-01-26 NOTE — Progress Notes (Signed)
Patient will DC to: [facility] Anticipated DC date: 01/26/17 Family notified: Pt notifying family Transport by:  Corey Harold   Per MD patient ready for DC to [facility]. RN, patient, patient's family, and facility notified of DC. Discharge Summary sent to facility. RN given number for report [phone]). DC packet on chart. Ambulance transport requested for patient.   CSW signing off.  Cedric Fishman, [place] Social Worker [phone]

## 2017-01-26 NOTE — Clinical Social Work Placement (Signed)
   CLINICAL SOCIAL WORK PLACEMENT  NOTE  Date:  01/26/2017  Patient Details  Name: Rachael [name] MRN: [id number] Date of Birth: Apr 19, 1956  Clinical Social Work is seeking post-discharge placement for this patient at the [place] level of care (*CSW will initial, date and re-position this form in  chart as items are completed):  Yes   Patient/family provided with [facility]'s list of facilities offering this level of care within the geographic area requested by the patient (or if unable, by the patient's family).  Yes   Patient/family informed of their freedom to choose among providers that offer the needed level of care, that participate in Medicare, Medicaid or managed care program needed by the patient, have an available bed and are willing to accept the patient.  Yes   Patient/family informed of [facility]'s ownership interest in [facility] and [facility], as well as of the fact that they are under no obligation to receive care at these facilities.  PASRR submitted to EDS on       PASRR number received on       Existing PASRR number confirmed on 01/20/17     FL2 transmitted to all facilities in geographic area requested by pt/family on 01/20/17     FL2 transmitted to all facilities within larger geographic area on       Patient informed that his/her managed care company has contracts with or will negotiate with certain facilities, including the following:        Yes   Patient/family informed of bed offers received.  Patient chooses bed at [facility]     Physician recommends and patient chooses bed at      Patient to be transferred to [facility] on 01/26/17.  Patient to be transferred to facility by [facility]     Patient family notified on 01/26/17 of transfer.  Name of family member notified:  N/A     PHYSICIAN       Additional Comment:     _______________________________________________ Benard Halsted, [place] 01/26/2017, 3:47 PM

## 2017-01-29 DIAGNOSIS — I82503 Chronic embolism and thrombosis of unspecified deep veins of lower extremity, bilateral: Secondary | ICD-10-CM | POA: Diagnosis not present

## 2017-01-29 DIAGNOSIS — R609 Edema, unspecified: Secondary | ICD-10-CM | POA: Diagnosis not present

## 2017-01-29 DIAGNOSIS — I509 Heart failure, unspecified: Secondary | ICD-10-CM | POA: Diagnosis not present

## 2017-01-30 DIAGNOSIS — I509 Heart failure, unspecified: Secondary | ICD-10-CM | POA: Diagnosis not present

## 2017-01-30 DIAGNOSIS — I10 Essential (primary) hypertension: Secondary | ICD-10-CM | POA: Diagnosis not present

## 2017-01-30 DIAGNOSIS — I2699 Other pulmonary embolism without acute cor pulmonale: Secondary | ICD-10-CM | POA: Diagnosis not present

## 2017-01-30 DIAGNOSIS — N184 Chronic kidney disease, stage 4 (severe): Secondary | ICD-10-CM | POA: Diagnosis not present

## 2017-02-06 DIAGNOSIS — N10 Acute tubulo-interstitial nephritis: Secondary | ICD-10-CM | POA: Diagnosis not present

## 2017-02-06 DIAGNOSIS — I2699 Other pulmonary embolism without acute cor pulmonale: Secondary | ICD-10-CM | POA: Diagnosis not present

## 2017-02-06 DIAGNOSIS — I82409 Acute embolism and thrombosis of unspecified deep veins of unspecified lower extremity: Secondary | ICD-10-CM | POA: Diagnosis not present

## 2017-02-06 DIAGNOSIS — E669 Obesity, unspecified: Secondary | ICD-10-CM | POA: Diagnosis not present

## 2017-02-06 DIAGNOSIS — D631 Anemia in chronic kidney disease: Secondary | ICD-10-CM | POA: Diagnosis not present

## 2017-02-06 DIAGNOSIS — I129 Hypertensive chronic kidney disease with stage 1 through stage 4 chronic kidney disease, or unspecified chronic kidney disease: Secondary | ICD-10-CM | POA: Diagnosis not present

## 2017-02-11 DIAGNOSIS — R609 Edema, unspecified: Secondary | ICD-10-CM | POA: Diagnosis not present

## 2017-02-11 DIAGNOSIS — I509 Heart failure, unspecified: Secondary | ICD-10-CM | POA: Diagnosis not present

## 2017-02-11 DIAGNOSIS — M6281 Muscle weakness (generalized): Secondary | ICD-10-CM | POA: Diagnosis not present

## 2017-02-11 DIAGNOSIS — N184 Chronic kidney disease, stage 4 (severe): Secondary | ICD-10-CM | POA: Diagnosis not present

## 2017-02-14 DIAGNOSIS — I509 Heart failure, unspecified: Secondary | ICD-10-CM | POA: Diagnosis not present

## 2017-02-14 DIAGNOSIS — D631 Anemia in chronic kidney disease: Secondary | ICD-10-CM | POA: Diagnosis not present

## 2017-02-14 DIAGNOSIS — I2699 Other pulmonary embolism without acute cor pulmonale: Secondary | ICD-10-CM | POA: Diagnosis not present

## 2017-02-14 DIAGNOSIS — I130 Hypertensive heart and chronic kidney disease with heart failure and stage 1 through stage 4 chronic kidney disease, or unspecified chronic kidney disease: Secondary | ICD-10-CM | POA: Diagnosis not present

## 2017-02-14 DIAGNOSIS — N184 Chronic kidney disease, stage 4 (severe): Secondary | ICD-10-CM | POA: Diagnosis not present

## 2017-02-14 DIAGNOSIS — I82403 Acute embolism and thrombosis of unspecified deep veins of lower extremity, bilateral: Secondary | ICD-10-CM | POA: Diagnosis not present

## 2017-02-16 ENCOUNTER — Other Ambulatory Visit: Payer: Self-pay

## 2017-02-16 NOTE — Patient Outreach (Signed)
[facility]) Care Management  02/16/2017  Rachael [name] [birth year] [id number]      Transition of Care Referral  Referral Date:  02/16/17 Referral Source: [facility] Discharge Report Date of Admission: unknown Diagnosis: bilateral DVTs Date of Discharge: 02/13/17 Facility: [place]: [facility]    Outreach attempt # 1 to patient. No answer. RN CM left HIPAA compliant message along with contact info.      Plan: RN CM will make outreach attempt to patient within one business day if no return call from patient.   Enzo Montgomery, RN,BSN,CCM [place] Management Telephonic Care Management Coordinator Direct Phone: [phone] Toll Free: [phone] Fax: [phone]

## 2017-02-17 ENCOUNTER — Other Ambulatory Visit: Payer: Self-pay

## 2017-02-17 DIAGNOSIS — I130 Hypertensive heart and chronic kidney disease with heart failure and stage 1 through stage 4 chronic kidney disease, or unspecified chronic kidney disease: Secondary | ICD-10-CM | POA: Diagnosis not present

## 2017-02-17 DIAGNOSIS — I509 Heart failure, unspecified: Secondary | ICD-10-CM | POA: Diagnosis not present

## 2017-02-17 DIAGNOSIS — I82403 Acute embolism and thrombosis of unspecified deep veins of lower extremity, bilateral: Secondary | ICD-10-CM | POA: Diagnosis not present

## 2017-02-17 DIAGNOSIS — I2699 Other pulmonary embolism without acute cor pulmonale: Secondary | ICD-10-CM | POA: Diagnosis not present

## 2017-02-17 DIAGNOSIS — D631 Anemia in chronic kidney disease: Secondary | ICD-10-CM | POA: Diagnosis not present

## 2017-02-17 DIAGNOSIS — N184 Chronic kidney disease, stage 4 (severe): Secondary | ICD-10-CM | POA: Diagnosis not present

## 2017-02-17 NOTE — Patient Outreach (Signed)
[facility]) Care Management  02/17/2017  Rachael [name] [birth year] [id number]   Transition of Care Referral  Referral Date:  02/16/17 Referral Source: [facility] Discharge Report Date of Admission: unknown Diagnosis: bilateral DVTs Date of Discharge: 02/13/17 Facility: [place]: [facility]   Outreach attempt #2 to patient. Spoke with patient. She states she is doing well since discharge from facility. She denies any needs or concerns at this time. States she has supportive family helping her out. She voices she has all her meds and knows how and when to take them. She reports she has her f/u appts in place and no issues regarding transportation. Patient denies any RN CM needs or concerns at this time. She was appreciative of call but does not need [facility] service at present.    Plan: RN CM will notify [facility] administrative assistant of case status.   Enzo Montgomery, RN,BSN,CCM [place] Management Telephonic Care Management Coordinator Direct Phone: [phone] Toll Free: [phone] Fax: [phone]

## 2017-02-18 DIAGNOSIS — I130 Hypertensive heart and chronic kidney disease with heart failure and stage 1 through stage 4 chronic kidney disease, or unspecified chronic kidney disease: Secondary | ICD-10-CM | POA: Diagnosis not present

## 2017-02-18 DIAGNOSIS — I2699 Other pulmonary embolism without acute cor pulmonale: Secondary | ICD-10-CM | POA: Diagnosis not present

## 2017-02-18 DIAGNOSIS — I509 Heart failure, unspecified: Secondary | ICD-10-CM | POA: Diagnosis not present

## 2017-02-18 DIAGNOSIS — N184 Chronic kidney disease, stage 4 (severe): Secondary | ICD-10-CM | POA: Diagnosis not present

## 2017-02-18 DIAGNOSIS — I82403 Acute embolism and thrombosis of unspecified deep veins of lower extremity, bilateral: Secondary | ICD-10-CM | POA: Diagnosis not present

## 2017-02-18 DIAGNOSIS — D631 Anemia in chronic kidney disease: Secondary | ICD-10-CM | POA: Diagnosis not present

## 2017-02-19 DIAGNOSIS — D631 Anemia in chronic kidney disease: Secondary | ICD-10-CM | POA: Diagnosis not present

## 2017-02-19 DIAGNOSIS — N184 Chronic kidney disease, stage 4 (severe): Secondary | ICD-10-CM | POA: Diagnosis not present

## 2017-02-19 DIAGNOSIS — I509 Heart failure, unspecified: Secondary | ICD-10-CM | POA: Diagnosis not present

## 2017-02-19 DIAGNOSIS — I130 Hypertensive heart and chronic kidney disease with heart failure and stage 1 through stage 4 chronic kidney disease, or unspecified chronic kidney disease: Secondary | ICD-10-CM | POA: Diagnosis not present

## 2017-02-19 DIAGNOSIS — I82403 Acute embolism and thrombosis of unspecified deep veins of lower extremity, bilateral: Secondary | ICD-10-CM | POA: Diagnosis not present

## 2017-02-19 DIAGNOSIS — N10 Acute tubulo-interstitial nephritis: Secondary | ICD-10-CM | POA: Diagnosis not present

## 2017-02-19 DIAGNOSIS — I2699 Other pulmonary embolism without acute cor pulmonale: Secondary | ICD-10-CM | POA: Diagnosis not present

## 2017-02-23 DIAGNOSIS — I509 Heart failure, unspecified: Secondary | ICD-10-CM | POA: Diagnosis not present

## 2017-02-23 DIAGNOSIS — I130 Hypertensive heart and chronic kidney disease with heart failure and stage 1 through stage 4 chronic kidney disease, or unspecified chronic kidney disease: Secondary | ICD-10-CM | POA: Diagnosis not present

## 2017-02-23 DIAGNOSIS — I82403 Acute embolism and thrombosis of unspecified deep veins of lower extremity, bilateral: Secondary | ICD-10-CM | POA: Diagnosis not present

## 2017-02-23 DIAGNOSIS — N184 Chronic kidney disease, stage 4 (severe): Secondary | ICD-10-CM | POA: Diagnosis not present

## 2017-02-23 DIAGNOSIS — I2699 Other pulmonary embolism without acute cor pulmonale: Secondary | ICD-10-CM | POA: Diagnosis not present

## 2017-02-23 DIAGNOSIS — D631 Anemia in chronic kidney disease: Secondary | ICD-10-CM | POA: Diagnosis not present

## 2017-02-24 DIAGNOSIS — S0003XA Contusion of scalp, initial encounter: Secondary | ICD-10-CM | POA: Diagnosis not present

## 2017-02-24 DIAGNOSIS — S064X0A Epidural hemorrhage without loss of consciousness, initial encounter: Secondary | ICD-10-CM | POA: Diagnosis not present

## 2017-02-24 DIAGNOSIS — S0990XA Unspecified injury of head, initial encounter: Secondary | ICD-10-CM | POA: Diagnosis not present

## 2017-02-24 DIAGNOSIS — S060X0A Concussion without loss of consciousness, initial encounter: Secondary | ICD-10-CM | POA: Diagnosis not present

## 2017-02-25 DIAGNOSIS — I2699 Other pulmonary embolism without acute cor pulmonale: Secondary | ICD-10-CM | POA: Diagnosis not present

## 2017-02-25 DIAGNOSIS — N184 Chronic kidney disease, stage 4 (severe): Secondary | ICD-10-CM | POA: Diagnosis not present

## 2017-02-25 DIAGNOSIS — I82403 Acute embolism and thrombosis of unspecified deep veins of lower extremity, bilateral: Secondary | ICD-10-CM | POA: Diagnosis not present

## 2017-02-25 DIAGNOSIS — I130 Hypertensive heart and chronic kidney disease with heart failure and stage 1 through stage 4 chronic kidney disease, or unspecified chronic kidney disease: Secondary | ICD-10-CM | POA: Diagnosis not present

## 2017-02-25 DIAGNOSIS — I509 Heart failure, unspecified: Secondary | ICD-10-CM | POA: Diagnosis not present

## 2017-02-25 DIAGNOSIS — D631 Anemia in chronic kidney disease: Secondary | ICD-10-CM | POA: Diagnosis not present

## 2017-02-27 DIAGNOSIS — K219 Gastro-esophageal reflux disease without esophagitis: Secondary | ICD-10-CM | POA: Diagnosis not present

## 2017-02-27 DIAGNOSIS — Z6841 Body Mass Index (BMI) 40.0 and over, adult: Secondary | ICD-10-CM | POA: Diagnosis not present

## 2017-02-27 DIAGNOSIS — E039 Hypothyroidism, unspecified: Secondary | ICD-10-CM | POA: Diagnosis not present

## 2017-02-27 DIAGNOSIS — F329 Major depressive disorder, single episode, unspecified: Secondary | ICD-10-CM | POA: Diagnosis not present

## 2017-02-27 DIAGNOSIS — Z86718 Personal history of other venous thrombosis and embolism: Secondary | ICD-10-CM | POA: Diagnosis not present

## 2017-03-03 DIAGNOSIS — I509 Heart failure, unspecified: Secondary | ICD-10-CM | POA: Diagnosis not present

## 2017-03-03 DIAGNOSIS — I82403 Acute embolism and thrombosis of unspecified deep veins of lower extremity, bilateral: Secondary | ICD-10-CM | POA: Diagnosis not present

## 2017-03-03 DIAGNOSIS — I130 Hypertensive heart and chronic kidney disease with heart failure and stage 1 through stage 4 chronic kidney disease, or unspecified chronic kidney disease: Secondary | ICD-10-CM | POA: Diagnosis not present

## 2017-03-03 DIAGNOSIS — D631 Anemia in chronic kidney disease: Secondary | ICD-10-CM | POA: Diagnosis not present

## 2017-03-03 DIAGNOSIS — N184 Chronic kidney disease, stage 4 (severe): Secondary | ICD-10-CM | POA: Diagnosis not present

## 2017-03-03 DIAGNOSIS — I2699 Other pulmonary embolism without acute cor pulmonale: Secondary | ICD-10-CM | POA: Diagnosis not present

## 2017-03-04 DIAGNOSIS — S81802A Unspecified open wound, left lower leg, initial encounter: Secondary | ICD-10-CM | POA: Diagnosis not present

## 2017-03-04 DIAGNOSIS — E039 Hypothyroidism, unspecified: Secondary | ICD-10-CM | POA: Diagnosis not present

## 2017-03-04 DIAGNOSIS — I129 Hypertensive chronic kidney disease with stage 1 through stage 4 chronic kidney disease, or unspecified chronic kidney disease: Secondary | ICD-10-CM | POA: Diagnosis not present

## 2017-03-04 DIAGNOSIS — Z86718 Personal history of other venous thrombosis and embolism: Secondary | ICD-10-CM | POA: Diagnosis not present

## 2017-03-04 DIAGNOSIS — D649 Anemia, unspecified: Secondary | ICD-10-CM | POA: Diagnosis not present

## 2017-03-04 DIAGNOSIS — I872 Venous insufficiency (chronic) (peripheral): Secondary | ICD-10-CM | POA: Diagnosis not present

## 2017-03-04 DIAGNOSIS — L97822 Non-pressure chronic ulcer of other part of left lower leg with fat layer exposed: Secondary | ICD-10-CM | POA: Diagnosis not present

## 2017-03-04 DIAGNOSIS — N184 Chronic kidney disease, stage 4 (severe): Secondary | ICD-10-CM | POA: Diagnosis not present

## 2017-03-05 DIAGNOSIS — I2699 Other pulmonary embolism without acute cor pulmonale: Secondary | ICD-10-CM | POA: Diagnosis not present

## 2017-03-05 DIAGNOSIS — I82403 Acute embolism and thrombosis of unspecified deep veins of lower extremity, bilateral: Secondary | ICD-10-CM | POA: Diagnosis not present

## 2017-03-05 DIAGNOSIS — I130 Hypertensive heart and chronic kidney disease with heart failure and stage 1 through stage 4 chronic kidney disease, or unspecified chronic kidney disease: Secondary | ICD-10-CM | POA: Diagnosis not present

## 2017-03-05 DIAGNOSIS — N184 Chronic kidney disease, stage 4 (severe): Secondary | ICD-10-CM | POA: Diagnosis not present

## 2017-03-05 DIAGNOSIS — D631 Anemia in chronic kidney disease: Secondary | ICD-10-CM | POA: Diagnosis not present

## 2017-03-05 DIAGNOSIS — I509 Heart failure, unspecified: Secondary | ICD-10-CM | POA: Diagnosis not present

## 2017-03-07 DIAGNOSIS — I2699 Other pulmonary embolism without acute cor pulmonale: Secondary | ICD-10-CM | POA: Diagnosis not present

## 2017-03-07 DIAGNOSIS — I82403 Acute embolism and thrombosis of unspecified deep veins of lower extremity, bilateral: Secondary | ICD-10-CM | POA: Diagnosis not present

## 2017-03-07 DIAGNOSIS — N184 Chronic kidney disease, stage 4 (severe): Secondary | ICD-10-CM | POA: Diagnosis not present

## 2017-03-07 DIAGNOSIS — I509 Heart failure, unspecified: Secondary | ICD-10-CM | POA: Diagnosis not present

## 2017-03-07 DIAGNOSIS — I130 Hypertensive heart and chronic kidney disease with heart failure and stage 1 through stage 4 chronic kidney disease, or unspecified chronic kidney disease: Secondary | ICD-10-CM | POA: Diagnosis not present

## 2017-03-07 DIAGNOSIS — D631 Anemia in chronic kidney disease: Secondary | ICD-10-CM | POA: Diagnosis not present

## 2017-03-09 DIAGNOSIS — I2699 Other pulmonary embolism without acute cor pulmonale: Secondary | ICD-10-CM | POA: Diagnosis not present

## 2017-03-09 DIAGNOSIS — I130 Hypertensive heart and chronic kidney disease with heart failure and stage 1 through stage 4 chronic kidney disease, or unspecified chronic kidney disease: Secondary | ICD-10-CM | POA: Diagnosis not present

## 2017-03-09 DIAGNOSIS — I82403 Acute embolism and thrombosis of unspecified deep veins of lower extremity, bilateral: Secondary | ICD-10-CM | POA: Diagnosis not present

## 2017-03-09 DIAGNOSIS — N184 Chronic kidney disease, stage 4 (severe): Secondary | ICD-10-CM | POA: Diagnosis not present

## 2017-03-09 DIAGNOSIS — I509 Heart failure, unspecified: Secondary | ICD-10-CM | POA: Diagnosis not present

## 2017-03-09 DIAGNOSIS — D631 Anemia in chronic kidney disease: Secondary | ICD-10-CM | POA: Diagnosis not present

## 2017-03-10 DIAGNOSIS — S81802A Unspecified open wound, left lower leg, initial encounter: Secondary | ICD-10-CM | POA: Diagnosis not present

## 2017-03-10 DIAGNOSIS — I872 Venous insufficiency (chronic) (peripheral): Secondary | ICD-10-CM | POA: Diagnosis not present

## 2017-03-10 DIAGNOSIS — L97822 Non-pressure chronic ulcer of other part of left lower leg with fat layer exposed: Secondary | ICD-10-CM | POA: Diagnosis not present

## 2017-03-12 DIAGNOSIS — E669 Obesity, unspecified: Secondary | ICD-10-CM | POA: Diagnosis not present

## 2017-03-12 DIAGNOSIS — I2699 Other pulmonary embolism without acute cor pulmonale: Secondary | ICD-10-CM | POA: Diagnosis not present

## 2017-03-12 DIAGNOSIS — I82409 Acute embolism and thrombosis of unspecified deep veins of unspecified lower extremity: Secondary | ICD-10-CM | POA: Diagnosis not present

## 2017-03-12 DIAGNOSIS — N10 Acute tubulo-interstitial nephritis: Secondary | ICD-10-CM | POA: Diagnosis not present

## 2017-03-13 DIAGNOSIS — D631 Anemia in chronic kidney disease: Secondary | ICD-10-CM | POA: Diagnosis not present

## 2017-03-13 DIAGNOSIS — I509 Heart failure, unspecified: Secondary | ICD-10-CM | POA: Diagnosis not present

## 2017-03-13 DIAGNOSIS — I82403 Acute embolism and thrombosis of unspecified deep veins of lower extremity, bilateral: Secondary | ICD-10-CM | POA: Diagnosis not present

## 2017-03-13 DIAGNOSIS — I2699 Other pulmonary embolism without acute cor pulmonale: Secondary | ICD-10-CM | POA: Diagnosis not present

## 2017-03-13 DIAGNOSIS — N184 Chronic kidney disease, stage 4 (severe): Secondary | ICD-10-CM | POA: Diagnosis not present

## 2017-03-13 DIAGNOSIS — I130 Hypertensive heart and chronic kidney disease with heart failure and stage 1 through stage 4 chronic kidney disease, or unspecified chronic kidney disease: Secondary | ICD-10-CM | POA: Diagnosis not present

## 2017-03-16 DIAGNOSIS — I130 Hypertensive heart and chronic kidney disease with heart failure and stage 1 through stage 4 chronic kidney disease, or unspecified chronic kidney disease: Secondary | ICD-10-CM | POA: Diagnosis not present

## 2017-03-16 DIAGNOSIS — I82403 Acute embolism and thrombosis of unspecified deep veins of lower extremity, bilateral: Secondary | ICD-10-CM | POA: Diagnosis not present

## 2017-03-16 DIAGNOSIS — N184 Chronic kidney disease, stage 4 (severe): Secondary | ICD-10-CM | POA: Diagnosis not present

## 2017-03-16 DIAGNOSIS — I509 Heart failure, unspecified: Secondary | ICD-10-CM | POA: Diagnosis not present

## 2017-03-16 DIAGNOSIS — I2699 Other pulmonary embolism without acute cor pulmonale: Secondary | ICD-10-CM | POA: Diagnosis not present

## 2017-03-16 DIAGNOSIS — D631 Anemia in chronic kidney disease: Secondary | ICD-10-CM | POA: Diagnosis not present

## 2017-03-17 DIAGNOSIS — S81812A Laceration without foreign body, left lower leg, initial encounter: Secondary | ICD-10-CM | POA: Diagnosis not present

## 2017-03-17 DIAGNOSIS — I872 Venous insufficiency (chronic) (peripheral): Secondary | ICD-10-CM | POA: Diagnosis not present

## 2017-03-17 DIAGNOSIS — S81802A Unspecified open wound, left lower leg, initial encounter: Secondary | ICD-10-CM | POA: Diagnosis not present

## 2017-03-20 DIAGNOSIS — I82403 Acute embolism and thrombosis of unspecified deep veins of lower extremity, bilateral: Secondary | ICD-10-CM | POA: Diagnosis not present

## 2017-03-20 DIAGNOSIS — D631 Anemia in chronic kidney disease: Secondary | ICD-10-CM | POA: Diagnosis not present

## 2017-03-20 DIAGNOSIS — N184 Chronic kidney disease, stage 4 (severe): Secondary | ICD-10-CM | POA: Diagnosis not present

## 2017-03-20 DIAGNOSIS — I2699 Other pulmonary embolism without acute cor pulmonale: Secondary | ICD-10-CM | POA: Diagnosis not present

## 2017-03-20 DIAGNOSIS — I130 Hypertensive heart and chronic kidney disease with heart failure and stage 1 through stage 4 chronic kidney disease, or unspecified chronic kidney disease: Secondary | ICD-10-CM | POA: Diagnosis not present

## 2017-03-20 DIAGNOSIS — I509 Heart failure, unspecified: Secondary | ICD-10-CM | POA: Diagnosis not present

## 2017-03-23 DIAGNOSIS — I509 Heart failure, unspecified: Secondary | ICD-10-CM | POA: Diagnosis not present

## 2017-03-23 DIAGNOSIS — I2699 Other pulmonary embolism without acute cor pulmonale: Secondary | ICD-10-CM | POA: Diagnosis not present

## 2017-03-23 DIAGNOSIS — D631 Anemia in chronic kidney disease: Secondary | ICD-10-CM | POA: Diagnosis not present

## 2017-03-23 DIAGNOSIS — N184 Chronic kidney disease, stage 4 (severe): Secondary | ICD-10-CM | POA: Diagnosis not present

## 2017-03-23 DIAGNOSIS — I130 Hypertensive heart and chronic kidney disease with heart failure and stage 1 through stage 4 chronic kidney disease, or unspecified chronic kidney disease: Secondary | ICD-10-CM | POA: Diagnosis not present

## 2017-03-23 DIAGNOSIS — I82403 Acute embolism and thrombosis of unspecified deep veins of lower extremity, bilateral: Secondary | ICD-10-CM | POA: Diagnosis not present

## 2017-03-24 DIAGNOSIS — I872 Venous insufficiency (chronic) (peripheral): Secondary | ICD-10-CM | POA: Diagnosis not present

## 2017-03-24 DIAGNOSIS — Z872 Personal history of diseases of the skin and subcutaneous tissue: Secondary | ICD-10-CM | POA: Diagnosis not present

## 2017-03-24 DIAGNOSIS — S81802D Unspecified open wound, left lower leg, subsequent encounter: Secondary | ICD-10-CM | POA: Diagnosis not present

## 2017-03-24 DIAGNOSIS — Z09 Encounter for follow-up examination after completed treatment for conditions other than malignant neoplasm: Secondary | ICD-10-CM | POA: Diagnosis not present

## 2017-03-27 DIAGNOSIS — J988 Other specified respiratory disorders: Secondary | ICD-10-CM | POA: Diagnosis not present

## 2017-03-27 DIAGNOSIS — D631 Anemia in chronic kidney disease: Secondary | ICD-10-CM | POA: Diagnosis not present

## 2017-03-27 DIAGNOSIS — I2699 Other pulmonary embolism without acute cor pulmonale: Secondary | ICD-10-CM | POA: Diagnosis not present

## 2017-03-27 DIAGNOSIS — I82403 Acute embolism and thrombosis of unspecified deep veins of lower extremity, bilateral: Secondary | ICD-10-CM | POA: Diagnosis not present

## 2017-03-27 DIAGNOSIS — N184 Chronic kidney disease, stage 4 (severe): Secondary | ICD-10-CM | POA: Diagnosis not present

## 2017-03-27 DIAGNOSIS — Z6837 Body mass index (BMI) 37.0-37.9, adult: Secondary | ICD-10-CM | POA: Diagnosis not present

## 2017-03-27 DIAGNOSIS — I509 Heart failure, unspecified: Secondary | ICD-10-CM | POA: Diagnosis not present

## 2017-03-27 DIAGNOSIS — I130 Hypertensive heart and chronic kidney disease with heart failure and stage 1 through stage 4 chronic kidney disease, or unspecified chronic kidney disease: Secondary | ICD-10-CM | POA: Diagnosis not present

## 2017-04-01 DIAGNOSIS — I2699 Other pulmonary embolism without acute cor pulmonale: Secondary | ICD-10-CM | POA: Diagnosis not present

## 2017-04-01 DIAGNOSIS — D631 Anemia in chronic kidney disease: Secondary | ICD-10-CM | POA: Diagnosis not present

## 2017-04-01 DIAGNOSIS — N184 Chronic kidney disease, stage 4 (severe): Secondary | ICD-10-CM | POA: Diagnosis not present

## 2017-04-01 DIAGNOSIS — I130 Hypertensive heart and chronic kidney disease with heart failure and stage 1 through stage 4 chronic kidney disease, or unspecified chronic kidney disease: Secondary | ICD-10-CM | POA: Diagnosis not present

## 2017-04-01 DIAGNOSIS — I509 Heart failure, unspecified: Secondary | ICD-10-CM | POA: Diagnosis not present

## 2017-04-01 DIAGNOSIS — I82403 Acute embolism and thrombosis of unspecified deep veins of lower extremity, bilateral: Secondary | ICD-10-CM | POA: Diagnosis not present

## 2017-04-02 DIAGNOSIS — N184 Chronic kidney disease, stage 4 (severe): Secondary | ICD-10-CM | POA: Diagnosis not present

## 2017-04-09 DIAGNOSIS — I82403 Acute embolism and thrombosis of unspecified deep veins of lower extremity, bilateral: Secondary | ICD-10-CM | POA: Diagnosis not present

## 2017-04-09 DIAGNOSIS — I2699 Other pulmonary embolism without acute cor pulmonale: Secondary | ICD-10-CM | POA: Diagnosis not present

## 2017-04-09 DIAGNOSIS — I509 Heart failure, unspecified: Secondary | ICD-10-CM | POA: Diagnosis not present

## 2017-04-09 DIAGNOSIS — D631 Anemia in chronic kidney disease: Secondary | ICD-10-CM | POA: Diagnosis not present

## 2017-04-09 DIAGNOSIS — N184 Chronic kidney disease, stage 4 (severe): Secondary | ICD-10-CM | POA: Diagnosis not present

## 2017-04-09 DIAGNOSIS — I130 Hypertensive heart and chronic kidney disease with heart failure and stage 1 through stage 4 chronic kidney disease, or unspecified chronic kidney disease: Secondary | ICD-10-CM | POA: Diagnosis not present

## 2017-04-14 DIAGNOSIS — I2699 Other pulmonary embolism without acute cor pulmonale: Secondary | ICD-10-CM | POA: Diagnosis not present

## 2017-04-14 DIAGNOSIS — I82403 Acute embolism and thrombosis of unspecified deep veins of lower extremity, bilateral: Secondary | ICD-10-CM | POA: Diagnosis not present

## 2017-04-14 DIAGNOSIS — D631 Anemia in chronic kidney disease: Secondary | ICD-10-CM | POA: Diagnosis not present

## 2017-04-14 DIAGNOSIS — N184 Chronic kidney disease, stage 4 (severe): Secondary | ICD-10-CM | POA: Diagnosis not present

## 2017-04-14 DIAGNOSIS — I130 Hypertensive heart and chronic kidney disease with heart failure and stage 1 through stage 4 chronic kidney disease, or unspecified chronic kidney disease: Secondary | ICD-10-CM | POA: Diagnosis not present

## 2017-04-14 DIAGNOSIS — I509 Heart failure, unspecified: Secondary | ICD-10-CM | POA: Diagnosis not present

## 2017-04-20 DIAGNOSIS — N184 Chronic kidney disease, stage 4 (severe): Secondary | ICD-10-CM | POA: Diagnosis not present

## 2017-04-21 DIAGNOSIS — N184 Chronic kidney disease, stage 4 (severe): Secondary | ICD-10-CM | POA: Diagnosis not present

## 2017-04-21 DIAGNOSIS — M26609 Unspecified temporomandibular joint disorder, unspecified side: Secondary | ICD-10-CM | POA: Diagnosis not present

## 2017-04-21 DIAGNOSIS — J45909 Unspecified asthma, uncomplicated: Secondary | ICD-10-CM | POA: Diagnosis not present

## 2017-04-21 DIAGNOSIS — Z6837 Body mass index (BMI) 37.0-37.9, adult: Secondary | ICD-10-CM | POA: Diagnosis not present

## 2017-04-22 DIAGNOSIS — E860 Dehydration: Secondary | ICD-10-CM | POA: Diagnosis not present

## 2017-04-28 DIAGNOSIS — R110 Nausea: Secondary | ICD-10-CM | POA: Diagnosis not present

## 2017-04-28 DIAGNOSIS — Z6839 Body mass index (BMI) 39.0-39.9, adult: Secondary | ICD-10-CM | POA: Diagnosis not present

## 2017-04-28 DIAGNOSIS — N184 Chronic kidney disease, stage 4 (severe): Secondary | ICD-10-CM | POA: Diagnosis not present

## 2017-04-28 DIAGNOSIS — Z7901 Long term (current) use of anticoagulants: Secondary | ICD-10-CM | POA: Diagnosis not present

## 2017-05-05 DIAGNOSIS — Z7901 Long term (current) use of anticoagulants: Secondary | ICD-10-CM | POA: Diagnosis not present

## 2017-05-13 DIAGNOSIS — N390 Urinary tract infection, site not specified: Secondary | ICD-10-CM | POA: Diagnosis not present

## 2017-05-13 DIAGNOSIS — E669 Obesity, unspecified: Secondary | ICD-10-CM | POA: Diagnosis not present

## 2017-05-13 DIAGNOSIS — N184 Chronic kidney disease, stage 4 (severe): Secondary | ICD-10-CM | POA: Diagnosis not present

## 2017-05-13 DIAGNOSIS — R600 Localized edema: Secondary | ICD-10-CM | POA: Diagnosis not present

## 2017-05-13 DIAGNOSIS — N10 Acute tubulo-interstitial nephritis: Secondary | ICD-10-CM | POA: Diagnosis not present

## 2017-05-13 IMAGING — US US BIOPSY
1 series · 8 of 8 positions shown · non-contrast
Comparison: none

CLINICAL DATA: Acute on chronic kidney disease and need for random
renal biopsy.

[Series 1: us biopsy · 0.22mm/px · 8 of 8 slices shown]
[im 1/8]
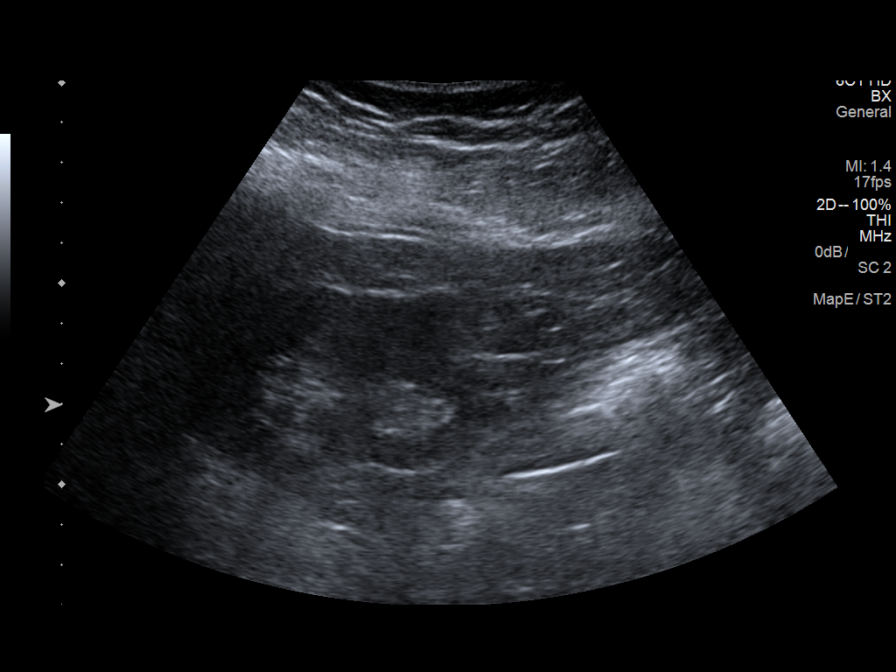
[im 2/8]
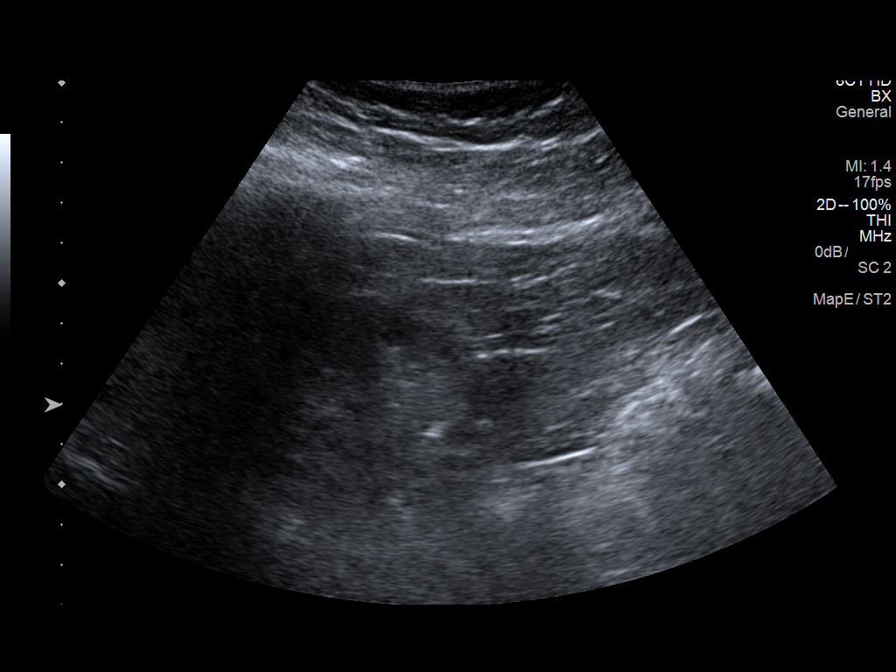
[im 3/8]
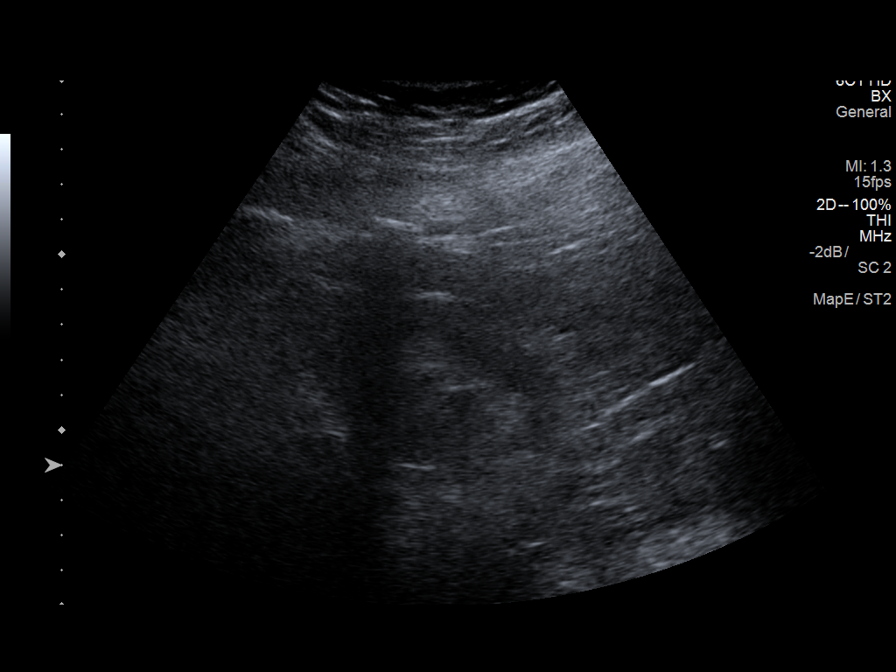
[im 4/8]
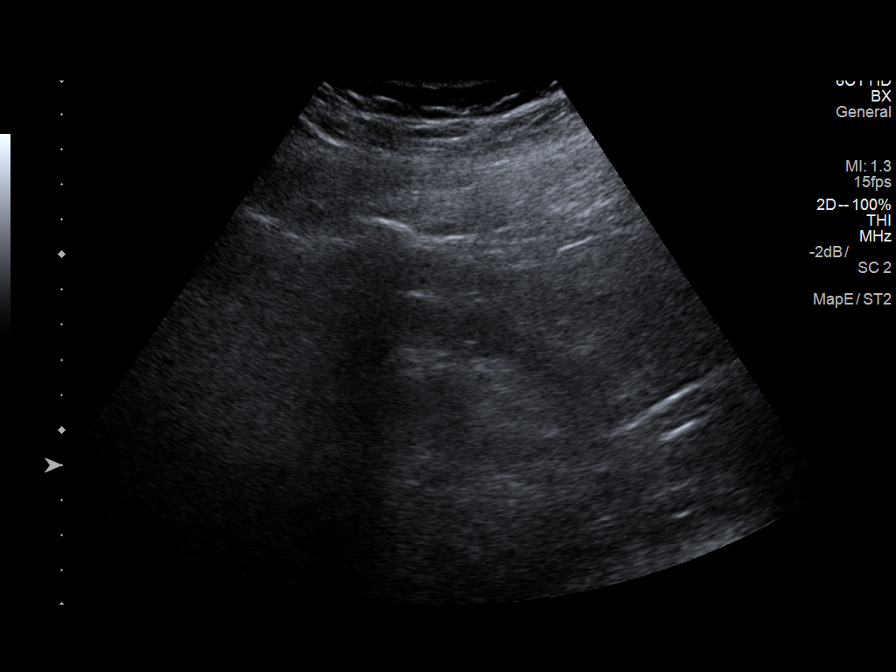
[im 5/8]
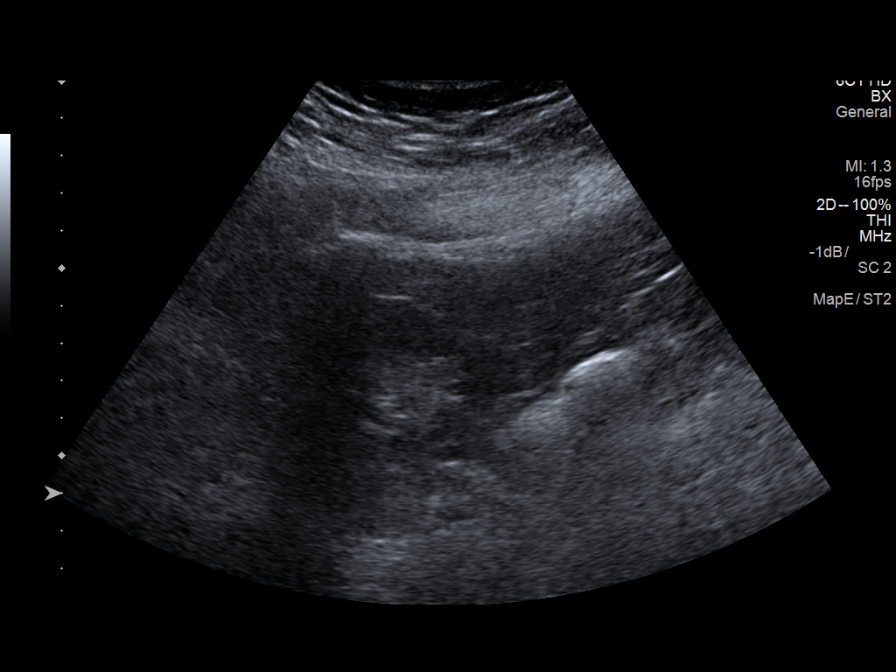
[im 6/8]
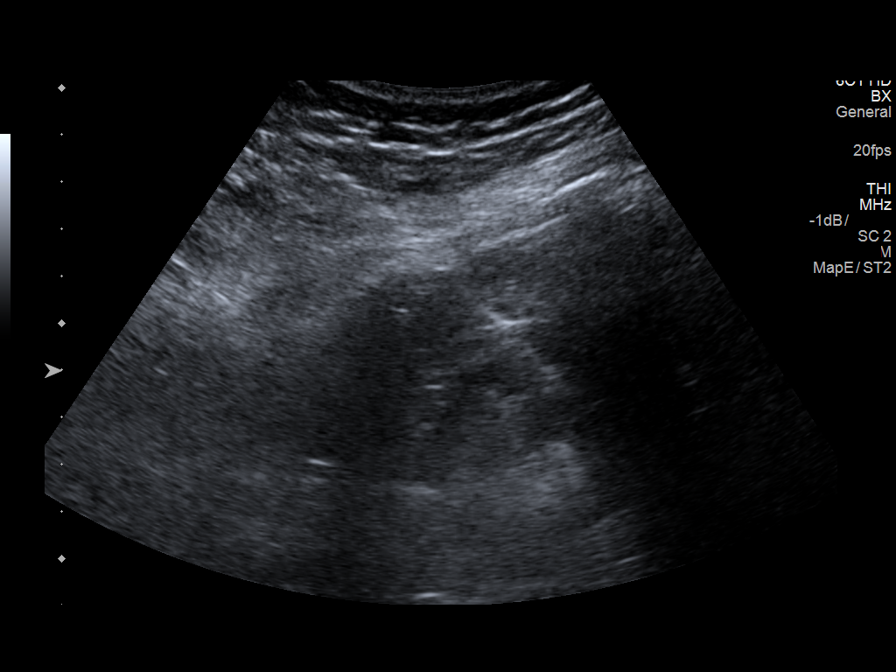
[im 7/8]
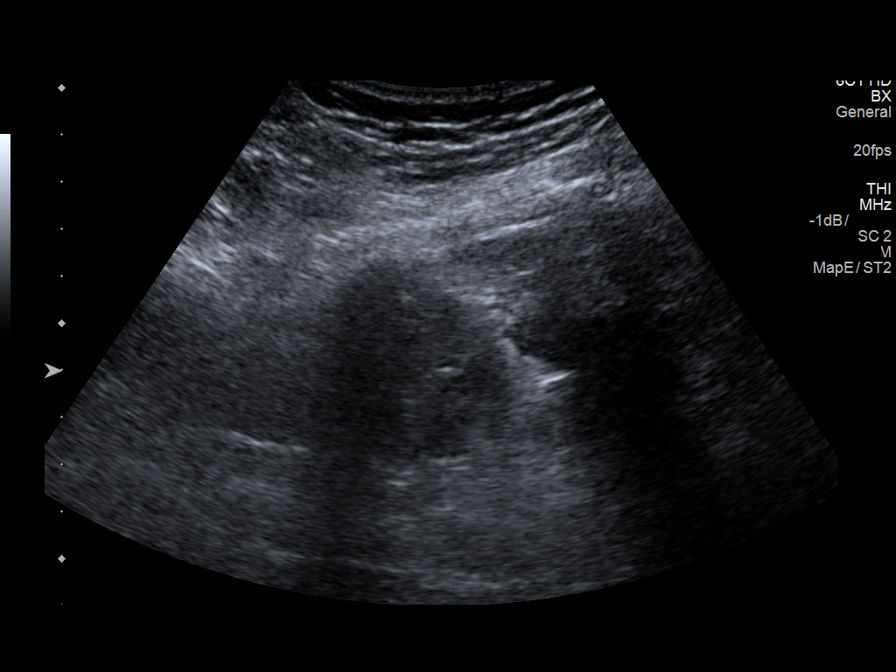
[im 8/8]
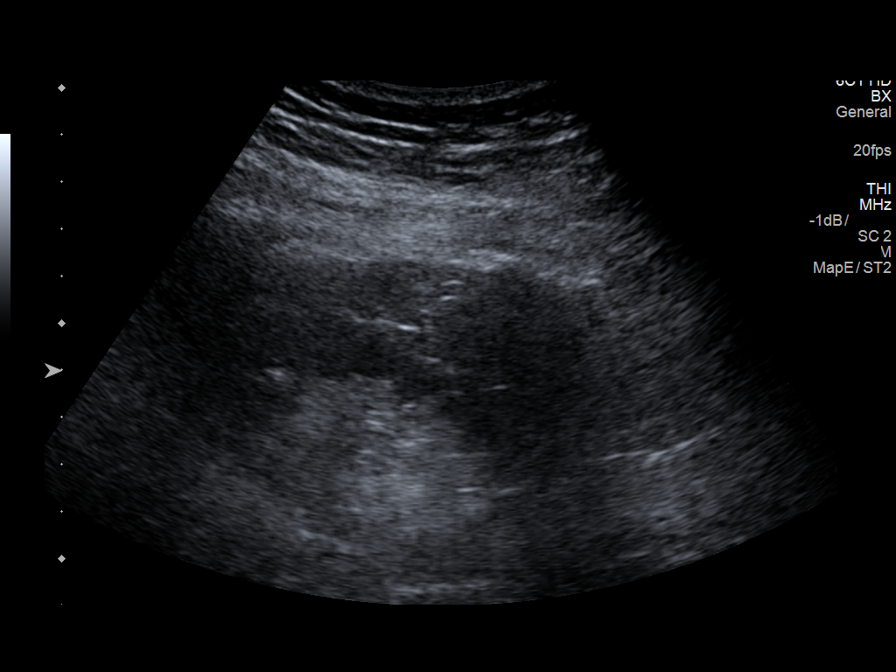

[8 of 8 positions shown; findings below may reference images not displayed]

EXAM:
ULTRASOUND GUIDED CORE BIOPSY OF RIGHT KIDNEY

MEDICATIONS:
2.0 mg IV Versed; 75 mcg IV Fentanyl

Total Moderate Sedation Time: 15 minutes.

The patient's level of consciousness and physiologic status were
continuously monitored during the procedure by Radiology nursing.

PROCEDURE:
The procedure, risks, benefits, and alternatives were explained to
the patient. Questions regarding the procedure were encouraged and
answered. The patient understands and consents to the procedure. A
time out was performed prior to initiating the procedure.

The right flank region was prepped with chlorhexidine in a sterile
fashion, and a sterile drape was applied covering the operative
field. A sterile gown and sterile gloves were used for the
procedure. Local anesthesia was provided with 1% Lidocaine.

Initial ultrasound was used to inspect both kidneys. The right was
chosen for biopsy. Under ultrasound guidance, 2 separate 16 gauge
core biopsy passes were made into lower pole cortex. Material was
submitted in saline. Post biopsy imaging was performed with
ultrasound.

COMPLICATIONS:
None.
FINDINGS: Visualization of the right kidney was better by ultrasound. The left
kidney also demonstrated more significant lower pole cortical
thinning compared to the right. Solid tissue was obtained with
biopsy.
IMPRESSION: Ultrasound-guided core biopsy performed of the right kidney at the
level of lower pole cortex.

## 2017-05-19 DIAGNOSIS — N184 Chronic kidney disease, stage 4 (severe): Secondary | ICD-10-CM | POA: Diagnosis not present

## 2017-05-19 DIAGNOSIS — S060X0D Concussion without loss of consciousness, subsequent encounter: Secondary | ICD-10-CM | POA: Diagnosis not present

## 2017-05-19 DIAGNOSIS — Z6839 Body mass index (BMI) 39.0-39.9, adult: Secondary | ICD-10-CM | POA: Diagnosis not present

## 2017-06-11 DIAGNOSIS — G4452 New daily persistent headache (NDPH): Secondary | ICD-10-CM | POA: Diagnosis not present

## 2017-06-17 DIAGNOSIS — I6782 Cerebral ischemia: Secondary | ICD-10-CM | POA: Diagnosis not present

## 2017-06-18 DIAGNOSIS — Z7901 Long term (current) use of anticoagulants: Secondary | ICD-10-CM | POA: Diagnosis not present

## 2017-07-02 DIAGNOSIS — Z7901 Long term (current) use of anticoagulants: Secondary | ICD-10-CM | POA: Diagnosis not present

## 2017-07-02 DIAGNOSIS — G4452 New daily persistent headache (NDPH): Secondary | ICD-10-CM | POA: Diagnosis not present

## 2017-07-02 DIAGNOSIS — Z79 Long term (current) drug therapy: Secondary | ICD-10-CM | POA: Diagnosis not present

## 2017-07-07 DIAGNOSIS — S060X1D Concussion with loss of consciousness of 30 minutes or less, subsequent encounter: Secondary | ICD-10-CM | POA: Diagnosis not present

## 2017-07-07 DIAGNOSIS — R51 Headache: Secondary | ICD-10-CM | POA: Diagnosis not present

## 2017-08-03 DIAGNOSIS — Z7901 Long term (current) use of anticoagulants: Secondary | ICD-10-CM | POA: Diagnosis not present

## 2017-08-03 DIAGNOSIS — Z23 Encounter for immunization: Secondary | ICD-10-CM | POA: Diagnosis not present

## 2017-08-23 DIAGNOSIS — K449 Diaphragmatic hernia without obstruction or gangrene: Secondary | ICD-10-CM | POA: Diagnosis not present

## 2017-08-23 DIAGNOSIS — R0789 Other chest pain: Secondary | ICD-10-CM | POA: Diagnosis not present

## 2017-08-23 DIAGNOSIS — R0602 Shortness of breath: Secondary | ICD-10-CM | POA: Diagnosis not present

## 2017-08-23 DIAGNOSIS — R51 Headache: Secondary | ICD-10-CM | POA: Diagnosis not present

## 2017-08-23 DIAGNOSIS — I2782 Chronic pulmonary embolism: Secondary | ICD-10-CM | POA: Diagnosis not present

## 2017-08-23 DIAGNOSIS — R9431 Abnormal electrocardiogram [ECG] [EKG]: Secondary | ICD-10-CM | POA: Diagnosis not present

## 2017-08-23 DIAGNOSIS — E039 Hypothyroidism, unspecified: Secondary | ICD-10-CM | POA: Diagnosis not present

## 2017-08-23 DIAGNOSIS — I82509 Chronic embolism and thrombosis of unspecified deep veins of unspecified lower extremity: Secondary | ICD-10-CM | POA: Diagnosis not present

## 2017-08-23 DIAGNOSIS — R079 Chest pain, unspecified: Secondary | ICD-10-CM | POA: Diagnosis not present

## 2017-08-24 DIAGNOSIS — I82509 Chronic embolism and thrombosis of unspecified deep veins of unspecified lower extremity: Secondary | ICD-10-CM | POA: Diagnosis not present

## 2017-08-24 DIAGNOSIS — N189 Chronic kidney disease, unspecified: Secondary | ICD-10-CM | POA: Diagnosis not present

## 2017-08-24 DIAGNOSIS — R51 Headache: Secondary | ICD-10-CM | POA: Diagnosis not present

## 2017-08-24 DIAGNOSIS — I2782 Chronic pulmonary embolism: Secondary | ICD-10-CM | POA: Diagnosis not present

## 2017-08-24 DIAGNOSIS — R079 Chest pain, unspecified: Secondary | ICD-10-CM | POA: Diagnosis not present

## 2017-08-28 DIAGNOSIS — Z6837 Body mass index (BMI) 37.0-37.9, adult: Secondary | ICD-10-CM | POA: Diagnosis not present

## 2017-08-28 DIAGNOSIS — Z7901 Long term (current) use of anticoagulants: Secondary | ICD-10-CM | POA: Diagnosis not present

## 2017-08-28 DIAGNOSIS — Z09 Encounter for follow-up examination after completed treatment for conditions other than malignant neoplasm: Secondary | ICD-10-CM | POA: Diagnosis not present

## 2017-08-28 DIAGNOSIS — I2699 Other pulmonary embolism without acute cor pulmonale: Secondary | ICD-10-CM | POA: Diagnosis not present

## 2017-09-02 DIAGNOSIS — N2581 Secondary hyperparathyroidism of renal origin: Secondary | ICD-10-CM | POA: Diagnosis not present

## 2017-09-02 DIAGNOSIS — N184 Chronic kidney disease, stage 4 (severe): Secondary | ICD-10-CM | POA: Diagnosis not present

## 2017-09-02 DIAGNOSIS — D631 Anemia in chronic kidney disease: Secondary | ICD-10-CM | POA: Diagnosis not present

## 2017-09-02 DIAGNOSIS — I82409 Acute embolism and thrombosis of unspecified deep veins of unspecified lower extremity: Secondary | ICD-10-CM | POA: Diagnosis not present

## 2017-09-02 DIAGNOSIS — I2699 Other pulmonary embolism without acute cor pulmonale: Secondary | ICD-10-CM | POA: Diagnosis not present

## 2017-09-04 DIAGNOSIS — Z7901 Long term (current) use of anticoagulants: Secondary | ICD-10-CM | POA: Diagnosis not present

## 2017-09-15 DIAGNOSIS — F329 Major depressive disorder, single episode, unspecified: Secondary | ICD-10-CM | POA: Diagnosis not present

## 2017-09-15 DIAGNOSIS — Z7901 Long term (current) use of anticoagulants: Secondary | ICD-10-CM | POA: Diagnosis not present

## 2017-09-15 DIAGNOSIS — Z0000 Encounter for general adult medical examination without abnormal findings: Secondary | ICD-10-CM | POA: Diagnosis not present

## 2017-09-15 DIAGNOSIS — Z6837 Body mass index (BMI) 37.0-37.9, adult: Secondary | ICD-10-CM | POA: Diagnosis not present

## 2017-09-15 DIAGNOSIS — N184 Chronic kidney disease, stage 4 (severe): Secondary | ICD-10-CM | POA: Diagnosis not present

## 2017-09-15 DIAGNOSIS — E039 Hypothyroidism, unspecified: Secondary | ICD-10-CM | POA: Diagnosis not present

## 2017-09-15 DIAGNOSIS — Z1331 Encounter for screening for depression: Secondary | ICD-10-CM | POA: Diagnosis not present

## 2017-09-15 DIAGNOSIS — R109 Unspecified abdominal pain: Secondary | ICD-10-CM | POA: Diagnosis not present

## 2017-09-15 DIAGNOSIS — E785 Hyperlipidemia, unspecified: Secondary | ICD-10-CM | POA: Diagnosis not present

## 2017-09-25 DIAGNOSIS — R300 Dysuria: Secondary | ICD-10-CM | POA: Diagnosis not present

## 2017-09-25 DIAGNOSIS — Z6837 Body mass index (BMI) 37.0-37.9, adult: Secondary | ICD-10-CM | POA: Diagnosis not present

## 2017-09-25 DIAGNOSIS — Z7901 Long term (current) use of anticoagulants: Secondary | ICD-10-CM | POA: Diagnosis not present

## 2017-09-30 DIAGNOSIS — R112 Nausea with vomiting, unspecified: Secondary | ICD-10-CM | POA: Diagnosis not present

## 2017-09-30 DIAGNOSIS — K219 Gastro-esophageal reflux disease without esophagitis: Secondary | ICD-10-CM | POA: Diagnosis not present

## 2017-09-30 DIAGNOSIS — K5730 Diverticulosis of large intestine without perforation or abscess without bleeding: Secondary | ICD-10-CM | POA: Diagnosis not present

## 2017-10-02 DIAGNOSIS — R112 Nausea with vomiting, unspecified: Secondary | ICD-10-CM | POA: Diagnosis not present

## 2017-10-02 DIAGNOSIS — K219 Gastro-esophageal reflux disease without esophagitis: Secondary | ICD-10-CM | POA: Diagnosis not present

## 2017-10-12 DIAGNOSIS — Z1231 Encounter for screening mammogram for malignant neoplasm of breast: Secondary | ICD-10-CM | POA: Diagnosis not present

## 2017-10-26 DIAGNOSIS — Z7901 Long term (current) use of anticoagulants: Secondary | ICD-10-CM | POA: Diagnosis not present

## 2017-11-03 DIAGNOSIS — I2699 Other pulmonary embolism without acute cor pulmonale: Secondary | ICD-10-CM | POA: Diagnosis not present

## 2017-11-03 DIAGNOSIS — K219 Gastro-esophageal reflux disease without esophagitis: Secondary | ICD-10-CM | POA: Diagnosis not present

## 2017-11-03 DIAGNOSIS — I82409 Acute embolism and thrombosis of unspecified deep veins of unspecified lower extremity: Secondary | ICD-10-CM | POA: Diagnosis not present

## 2017-11-03 DIAGNOSIS — N184 Chronic kidney disease, stage 4 (severe): Secondary | ICD-10-CM | POA: Diagnosis not present

## 2017-11-03 DIAGNOSIS — N2581 Secondary hyperparathyroidism of renal origin: Secondary | ICD-10-CM | POA: Diagnosis not present

## 2017-11-03 DIAGNOSIS — D631 Anemia in chronic kidney disease: Secondary | ICD-10-CM | POA: Diagnosis not present

## 2017-11-04 DIAGNOSIS — Z7901 Long term (current) use of anticoagulants: Secondary | ICD-10-CM | POA: Diagnosis not present

## 2017-11-05 DIAGNOSIS — K449 Diaphragmatic hernia without obstruction or gangrene: Secondary | ICD-10-CM | POA: Diagnosis not present

## 2017-11-05 DIAGNOSIS — D509 Iron deficiency anemia, unspecified: Secondary | ICD-10-CM | POA: Diagnosis not present

## 2017-11-05 DIAGNOSIS — N189 Chronic kidney disease, unspecified: Secondary | ICD-10-CM | POA: Diagnosis not present

## 2017-11-05 DIAGNOSIS — R1013 Epigastric pain: Secondary | ICD-10-CM | POA: Diagnosis not present

## 2017-11-05 DIAGNOSIS — K219 Gastro-esophageal reflux disease without esophagitis: Secondary | ICD-10-CM | POA: Diagnosis not present

## 2017-11-05 DIAGNOSIS — K208 Other esophagitis: Secondary | ICD-10-CM | POA: Diagnosis not present

## 2017-11-05 DIAGNOSIS — I129 Hypertensive chronic kidney disease with stage 1 through stage 4 chronic kidney disease, or unspecified chronic kidney disease: Secondary | ICD-10-CM | POA: Diagnosis not present

## 2017-11-05 DIAGNOSIS — E039 Hypothyroidism, unspecified: Secondary | ICD-10-CM | POA: Diagnosis not present

## 2017-11-05 DIAGNOSIS — R112 Nausea with vomiting, unspecified: Secondary | ICD-10-CM | POA: Diagnosis not present

## 2017-11-05 DIAGNOSIS — R1310 Dysphagia, unspecified: Secondary | ICD-10-CM | POA: Diagnosis not present

## 2017-11-05 DIAGNOSIS — K317 Polyp of stomach and duodenum: Secondary | ICD-10-CM | POA: Diagnosis not present

## 2017-11-05 DIAGNOSIS — K439 Ventral hernia without obstruction or gangrene: Secondary | ICD-10-CM | POA: Diagnosis not present

## 2017-11-09 DIAGNOSIS — Z7901 Long term (current) use of anticoagulants: Secondary | ICD-10-CM | POA: Diagnosis not present

## 2017-11-12 DIAGNOSIS — Z7901 Long term (current) use of anticoagulants: Secondary | ICD-10-CM | POA: Diagnosis not present

## 2017-11-17 DIAGNOSIS — Z7901 Long term (current) use of anticoagulants: Secondary | ICD-10-CM | POA: Diagnosis not present

## 2017-12-09 DIAGNOSIS — K219 Gastro-esophageal reflux disease without esophagitis: Secondary | ICD-10-CM | POA: Diagnosis not present

## 2017-12-09 DIAGNOSIS — R1013 Epigastric pain: Secondary | ICD-10-CM | POA: Diagnosis not present

## 2017-12-14 DIAGNOSIS — R1310 Dysphagia, unspecified: Secondary | ICD-10-CM | POA: Diagnosis not present

## 2017-12-14 DIAGNOSIS — Z7901 Long term (current) use of anticoagulants: Secondary | ICD-10-CM | POA: Diagnosis not present

## 2017-12-14 DIAGNOSIS — T17998S Other foreign object in respiratory tract, part unspecified causing other injury, sequela: Secondary | ICD-10-CM | POA: Diagnosis not present

## 2017-12-19 DIAGNOSIS — R079 Chest pain, unspecified: Secondary | ICD-10-CM | POA: Diagnosis not present

## 2017-12-19 DIAGNOSIS — Z8673 Personal history of transient ischemic attack (TIA), and cerebral infarction without residual deficits: Secondary | ICD-10-CM | POA: Diagnosis not present

## 2017-12-19 DIAGNOSIS — I209 Angina pectoris, unspecified: Secondary | ICD-10-CM | POA: Diagnosis not present

## 2017-12-19 DIAGNOSIS — F329 Major depressive disorder, single episode, unspecified: Secondary | ICD-10-CM | POA: Diagnosis not present

## 2017-12-19 DIAGNOSIS — K219 Gastro-esophageal reflux disease without esophagitis: Secondary | ICD-10-CM | POA: Diagnosis not present

## 2017-12-19 DIAGNOSIS — M1990 Unspecified osteoarthritis, unspecified site: Secondary | ICD-10-CM | POA: Diagnosis not present

## 2017-12-19 DIAGNOSIS — N184 Chronic kidney disease, stage 4 (severe): Secondary | ICD-10-CM | POA: Diagnosis not present

## 2017-12-19 DIAGNOSIS — I130 Hypertensive heart and chronic kidney disease with heart failure and stage 1 through stage 4 chronic kidney disease, or unspecified chronic kidney disease: Secondary | ICD-10-CM | POA: Diagnosis not present

## 2017-12-19 DIAGNOSIS — I5032 Chronic diastolic (congestive) heart failure: Secondary | ICD-10-CM | POA: Diagnosis not present

## 2017-12-19 DIAGNOSIS — E089 Diabetes mellitus due to underlying condition without complications: Secondary | ICD-10-CM | POA: Diagnosis not present

## 2017-12-19 DIAGNOSIS — R791 Abnormal coagulation profile: Secondary | ICD-10-CM | POA: Diagnosis not present

## 2017-12-19 DIAGNOSIS — I269 Pulmonary embolism without acute cor pulmonale: Secondary | ICD-10-CM | POA: Diagnosis not present

## 2017-12-20 DIAGNOSIS — E039 Hypothyroidism, unspecified: Secondary | ICD-10-CM | POA: Diagnosis not present

## 2017-12-20 DIAGNOSIS — Z5181 Encounter for therapeutic drug level monitoring: Secondary | ICD-10-CM | POA: Diagnosis not present

## 2017-12-20 DIAGNOSIS — N189 Chronic kidney disease, unspecified: Secondary | ICD-10-CM | POA: Diagnosis not present

## 2017-12-20 DIAGNOSIS — I82509 Chronic embolism and thrombosis of unspecified deep veins of unspecified lower extremity: Secondary | ICD-10-CM | POA: Diagnosis not present

## 2017-12-20 DIAGNOSIS — F329 Major depressive disorder, single episode, unspecified: Secondary | ICD-10-CM | POA: Diagnosis not present

## 2017-12-20 DIAGNOSIS — R079 Chest pain, unspecified: Secondary | ICD-10-CM | POA: Diagnosis not present

## 2017-12-20 DIAGNOSIS — I2782 Chronic pulmonary embolism: Secondary | ICD-10-CM | POA: Diagnosis not present

## 2017-12-21 DIAGNOSIS — F329 Major depressive disorder, single episode, unspecified: Secondary | ICD-10-CM | POA: Diagnosis not present

## 2017-12-21 DIAGNOSIS — R079 Chest pain, unspecified: Secondary | ICD-10-CM | POA: Diagnosis not present

## 2017-12-21 DIAGNOSIS — N189 Chronic kidney disease, unspecified: Secondary | ICD-10-CM | POA: Diagnosis not present

## 2017-12-21 DIAGNOSIS — I2782 Chronic pulmonary embolism: Secondary | ICD-10-CM | POA: Diagnosis not present

## 2017-12-21 DIAGNOSIS — Z5181 Encounter for therapeutic drug level monitoring: Secondary | ICD-10-CM | POA: Diagnosis not present

## 2017-12-21 DIAGNOSIS — I82509 Chronic embolism and thrombosis of unspecified deep veins of unspecified lower extremity: Secondary | ICD-10-CM | POA: Diagnosis not present

## 2017-12-21 DIAGNOSIS — E039 Hypothyroidism, unspecified: Secondary | ICD-10-CM | POA: Diagnosis not present

## 2017-12-22 DIAGNOSIS — E039 Hypothyroidism, unspecified: Secondary | ICD-10-CM | POA: Diagnosis not present

## 2017-12-22 DIAGNOSIS — N189 Chronic kidney disease, unspecified: Secondary | ICD-10-CM | POA: Diagnosis not present

## 2017-12-22 DIAGNOSIS — I2782 Chronic pulmonary embolism: Secondary | ICD-10-CM | POA: Diagnosis not present

## 2017-12-22 DIAGNOSIS — F329 Major depressive disorder, single episode, unspecified: Secondary | ICD-10-CM | POA: Diagnosis not present

## 2017-12-22 DIAGNOSIS — R079 Chest pain, unspecified: Secondary | ICD-10-CM | POA: Diagnosis not present

## 2017-12-22 DIAGNOSIS — Z5181 Encounter for therapeutic drug level monitoring: Secondary | ICD-10-CM | POA: Diagnosis not present

## 2017-12-22 DIAGNOSIS — I82509 Chronic embolism and thrombosis of unspecified deep veins of unspecified lower extremity: Secondary | ICD-10-CM | POA: Diagnosis not present

## 2017-12-28 DIAGNOSIS — N10 Acute tubulo-interstitial nephritis: Secondary | ICD-10-CM | POA: Diagnosis not present

## 2017-12-28 DIAGNOSIS — I2699 Other pulmonary embolism without acute cor pulmonale: Secondary | ICD-10-CM | POA: Diagnosis not present

## 2017-12-28 DIAGNOSIS — N184 Chronic kidney disease, stage 4 (severe): Secondary | ICD-10-CM | POA: Diagnosis not present

## 2017-12-28 DIAGNOSIS — I82409 Acute embolism and thrombosis of unspecified deep veins of unspecified lower extremity: Secondary | ICD-10-CM | POA: Diagnosis not present

## 2017-12-29 DIAGNOSIS — Z7901 Long term (current) use of anticoagulants: Secondary | ICD-10-CM | POA: Diagnosis not present

## 2018-01-01 DIAGNOSIS — J441 Chronic obstructive pulmonary disease with (acute) exacerbation: Secondary | ICD-10-CM | POA: Diagnosis not present

## 2018-01-01 DIAGNOSIS — R748 Abnormal levels of other serum enzymes: Secondary | ICD-10-CM | POA: Diagnosis not present

## 2018-01-01 DIAGNOSIS — R739 Hyperglycemia, unspecified: Secondary | ICD-10-CM | POA: Diagnosis not present

## 2018-01-01 DIAGNOSIS — D72829 Elevated white blood cell count, unspecified: Secondary | ICD-10-CM | POA: Diagnosis not present

## 2018-01-01 DIAGNOSIS — R7989 Other specified abnormal findings of blood chemistry: Secondary | ICD-10-CM | POA: Diagnosis not present

## 2018-01-02 DIAGNOSIS — R748 Abnormal levels of other serum enzymes: Secondary | ICD-10-CM | POA: Diagnosis not present

## 2018-01-02 DIAGNOSIS — J441 Chronic obstructive pulmonary disease with (acute) exacerbation: Secondary | ICD-10-CM | POA: Diagnosis not present

## 2018-01-02 DIAGNOSIS — R739 Hyperglycemia, unspecified: Secondary | ICD-10-CM | POA: Diagnosis not present

## 2018-01-02 DIAGNOSIS — R7989 Other specified abnormal findings of blood chemistry: Secondary | ICD-10-CM | POA: Diagnosis not present

## 2018-01-02 DIAGNOSIS — D72829 Elevated white blood cell count, unspecified: Secondary | ICD-10-CM | POA: Diagnosis not present

## 2018-01-03 DIAGNOSIS — J441 Chronic obstructive pulmonary disease with (acute) exacerbation: Secondary | ICD-10-CM | POA: Diagnosis not present

## 2018-01-03 DIAGNOSIS — D72829 Elevated white blood cell count, unspecified: Secondary | ICD-10-CM | POA: Diagnosis not present

## 2018-01-03 DIAGNOSIS — R739 Hyperglycemia, unspecified: Secondary | ICD-10-CM | POA: Diagnosis not present

## 2018-01-03 DIAGNOSIS — R7989 Other specified abnormal findings of blood chemistry: Secondary | ICD-10-CM | POA: Diagnosis not present

## 2018-01-03 DIAGNOSIS — R748 Abnormal levels of other serum enzymes: Secondary | ICD-10-CM | POA: Diagnosis not present

## 2018-01-04 DIAGNOSIS — D72829 Elevated white blood cell count, unspecified: Secondary | ICD-10-CM | POA: Diagnosis not present

## 2018-01-04 DIAGNOSIS — R748 Abnormal levels of other serum enzymes: Secondary | ICD-10-CM | POA: Diagnosis not present

## 2018-01-04 DIAGNOSIS — J441 Chronic obstructive pulmonary disease with (acute) exacerbation: Secondary | ICD-10-CM | POA: Diagnosis not present

## 2018-01-04 DIAGNOSIS — R7989 Other specified abnormal findings of blood chemistry: Secondary | ICD-10-CM | POA: Diagnosis not present

## 2018-01-04 DIAGNOSIS — R739 Hyperglycemia, unspecified: Secondary | ICD-10-CM | POA: Diagnosis not present

## 2018-01-05 DIAGNOSIS — D72829 Elevated white blood cell count, unspecified: Secondary | ICD-10-CM | POA: Diagnosis not present

## 2018-01-05 DIAGNOSIS — J441 Chronic obstructive pulmonary disease with (acute) exacerbation: Secondary | ICD-10-CM | POA: Diagnosis not present

## 2018-01-05 DIAGNOSIS — R748 Abnormal levels of other serum enzymes: Secondary | ICD-10-CM | POA: Diagnosis not present

## 2018-01-05 DIAGNOSIS — R739 Hyperglycemia, unspecified: Secondary | ICD-10-CM | POA: Diagnosis not present

## 2018-01-05 DIAGNOSIS — R7989 Other specified abnormal findings of blood chemistry: Secondary | ICD-10-CM | POA: Diagnosis not present

## 2018-01-12 DIAGNOSIS — Z7901 Long term (current) use of anticoagulants: Secondary | ICD-10-CM | POA: Diagnosis not present

## 2018-01-25 DIAGNOSIS — Z7901 Long term (current) use of anticoagulants: Secondary | ICD-10-CM | POA: Diagnosis not present

## 2018-02-08 DIAGNOSIS — Z7901 Long term (current) use of anticoagulants: Secondary | ICD-10-CM | POA: Diagnosis not present

## 2018-03-01 DIAGNOSIS — Z7901 Long term (current) use of anticoagulants: Secondary | ICD-10-CM | POA: Diagnosis not present

## 2018-03-15 DIAGNOSIS — Z7901 Long term (current) use of anticoagulants: Secondary | ICD-10-CM | POA: Diagnosis not present

## 2018-03-16 DIAGNOSIS — W5501XA Bitten by cat, initial encounter: Secondary | ICD-10-CM | POA: Diagnosis not present

## 2018-03-16 DIAGNOSIS — R300 Dysuria: Secondary | ICD-10-CM | POA: Diagnosis not present

## 2018-03-16 DIAGNOSIS — Z23 Encounter for immunization: Secondary | ICD-10-CM | POA: Diagnosis not present

## 2018-03-16 DIAGNOSIS — Z6838 Body mass index (BMI) 38.0-38.9, adult: Secondary | ICD-10-CM | POA: Diagnosis not present

## 2018-03-29 DIAGNOSIS — N2581 Secondary hyperparathyroidism of renal origin: Secondary | ICD-10-CM | POA: Diagnosis not present

## 2018-03-29 DIAGNOSIS — R399 Unspecified symptoms and signs involving the genitourinary system: Secondary | ICD-10-CM | POA: Diagnosis not present

## 2018-03-29 DIAGNOSIS — I2699 Other pulmonary embolism without acute cor pulmonale: Secondary | ICD-10-CM | POA: Diagnosis not present

## 2018-03-29 DIAGNOSIS — D631 Anemia in chronic kidney disease: Secondary | ICD-10-CM | POA: Diagnosis not present

## 2018-03-29 DIAGNOSIS — N184 Chronic kidney disease, stage 4 (severe): Secondary | ICD-10-CM | POA: Diagnosis not present

## 2018-03-29 DIAGNOSIS — N189 Chronic kidney disease, unspecified: Secondary | ICD-10-CM | POA: Diagnosis not present

## 2018-04-08 DIAGNOSIS — Z7901 Long term (current) use of anticoagulants: Secondary | ICD-10-CM | POA: Diagnosis not present

## 2018-04-08 DIAGNOSIS — I130 Hypertensive heart and chronic kidney disease with heart failure and stage 1 through stage 4 chronic kidney disease, or unspecified chronic kidney disease: Secondary | ICD-10-CM | POA: Diagnosis not present

## 2018-04-08 DIAGNOSIS — Z79899 Other long term (current) drug therapy: Secondary | ICD-10-CM | POA: Diagnosis not present

## 2018-04-08 DIAGNOSIS — I509 Heart failure, unspecified: Secondary | ICD-10-CM | POA: Diagnosis not present

## 2018-04-08 DIAGNOSIS — I4891 Unspecified atrial fibrillation: Secondary | ICD-10-CM | POA: Diagnosis not present

## 2018-04-08 DIAGNOSIS — E039 Hypothyroidism, unspecified: Secondary | ICD-10-CM | POA: Diagnosis not present

## 2018-04-08 DIAGNOSIS — R002 Palpitations: Secondary | ICD-10-CM | POA: Diagnosis not present

## 2018-04-08 DIAGNOSIS — N189 Chronic kidney disease, unspecified: Secondary | ICD-10-CM | POA: Diagnosis not present

## 2018-04-08 DIAGNOSIS — I129 Hypertensive chronic kidney disease with stage 1 through stage 4 chronic kidney disease, or unspecified chronic kidney disease: Secondary | ICD-10-CM | POA: Diagnosis not present

## 2018-04-16 DIAGNOSIS — Z7901 Long term (current) use of anticoagulants: Secondary | ICD-10-CM | POA: Diagnosis not present

## 2018-04-19 DIAGNOSIS — H52209 Unspecified astigmatism, unspecified eye: Secondary | ICD-10-CM | POA: Diagnosis not present

## 2018-04-19 DIAGNOSIS — H5213 Myopia, bilateral: Secondary | ICD-10-CM | POA: Diagnosis not present

## 2018-04-19 DIAGNOSIS — H524 Presbyopia: Secondary | ICD-10-CM | POA: Diagnosis not present

## 2018-04-21 DIAGNOSIS — R1012 Left upper quadrant pain: Secondary | ICD-10-CM | POA: Diagnosis not present

## 2018-04-21 DIAGNOSIS — K219 Gastro-esophageal reflux disease without esophagitis: Secondary | ICD-10-CM | POA: Diagnosis not present

## 2018-04-21 DIAGNOSIS — Z6838 Body mass index (BMI) 38.0-38.9, adult: Secondary | ICD-10-CM | POA: Diagnosis not present

## 2018-04-21 DIAGNOSIS — M26609 Unspecified temporomandibular joint disorder, unspecified side: Secondary | ICD-10-CM | POA: Diagnosis not present

## 2018-05-03 DIAGNOSIS — Z7901 Long term (current) use of anticoagulants: Secondary | ICD-10-CM | POA: Diagnosis not present

## 2018-05-17 DIAGNOSIS — K219 Gastro-esophageal reflux disease without esophagitis: Secondary | ICD-10-CM | POA: Diagnosis not present

## 2018-05-17 DIAGNOSIS — Z7901 Long term (current) use of anticoagulants: Secondary | ICD-10-CM | POA: Diagnosis not present

## 2018-05-17 DIAGNOSIS — R1013 Epigastric pain: Secondary | ICD-10-CM | POA: Diagnosis not present

## 2018-05-19 DIAGNOSIS — K439 Ventral hernia without obstruction or gangrene: Secondary | ICD-10-CM | POA: Diagnosis not present

## 2018-05-19 DIAGNOSIS — I700 Atherosclerosis of aorta: Secondary | ICD-10-CM | POA: Diagnosis not present

## 2018-05-19 DIAGNOSIS — R1012 Left upper quadrant pain: Secondary | ICD-10-CM | POA: Diagnosis not present

## 2018-05-19 DIAGNOSIS — Z8719 Personal history of other diseases of the digestive system: Secondary | ICD-10-CM | POA: Diagnosis not present

## 2018-05-19 DIAGNOSIS — K449 Diaphragmatic hernia without obstruction or gangrene: Secondary | ICD-10-CM | POA: Diagnosis not present

## 2018-06-14 DIAGNOSIS — Z7901 Long term (current) use of anticoagulants: Secondary | ICD-10-CM | POA: Diagnosis not present

## 2018-06-28 DIAGNOSIS — Z7901 Long term (current) use of anticoagulants: Secondary | ICD-10-CM | POA: Diagnosis not present

## 2018-06-28 DIAGNOSIS — Z23 Encounter for immunization: Secondary | ICD-10-CM | POA: Diagnosis not present

## 2018-07-02 IMAGING — CR DG CHEST 2V
2 series · 2 of 2 positions shown · non-contrast
Comparison: CT 06/07/2016 .

CLINICAL DATA: Nonproductive cough.  Shortness of breath

EXAM:
CHEST  2 VIEW

[chest pa]
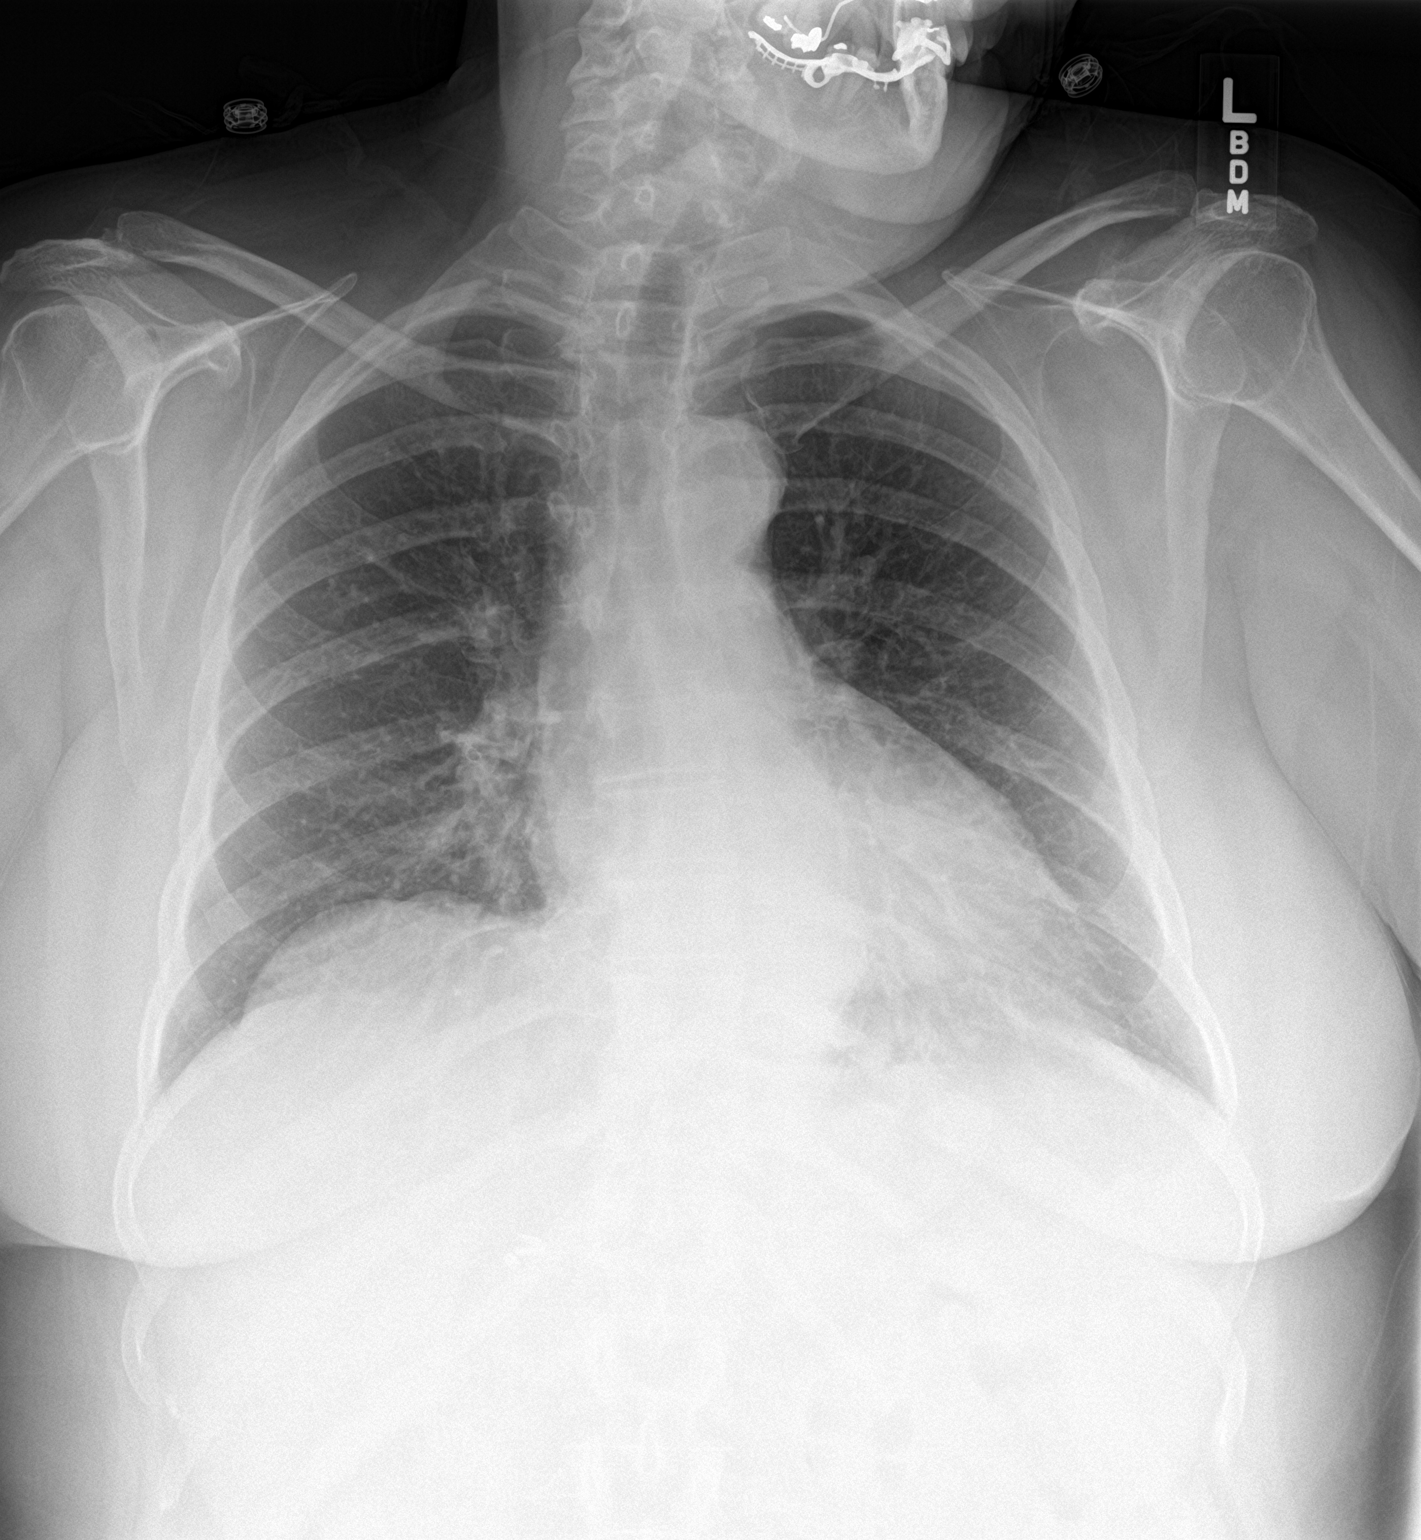

[chest lat]
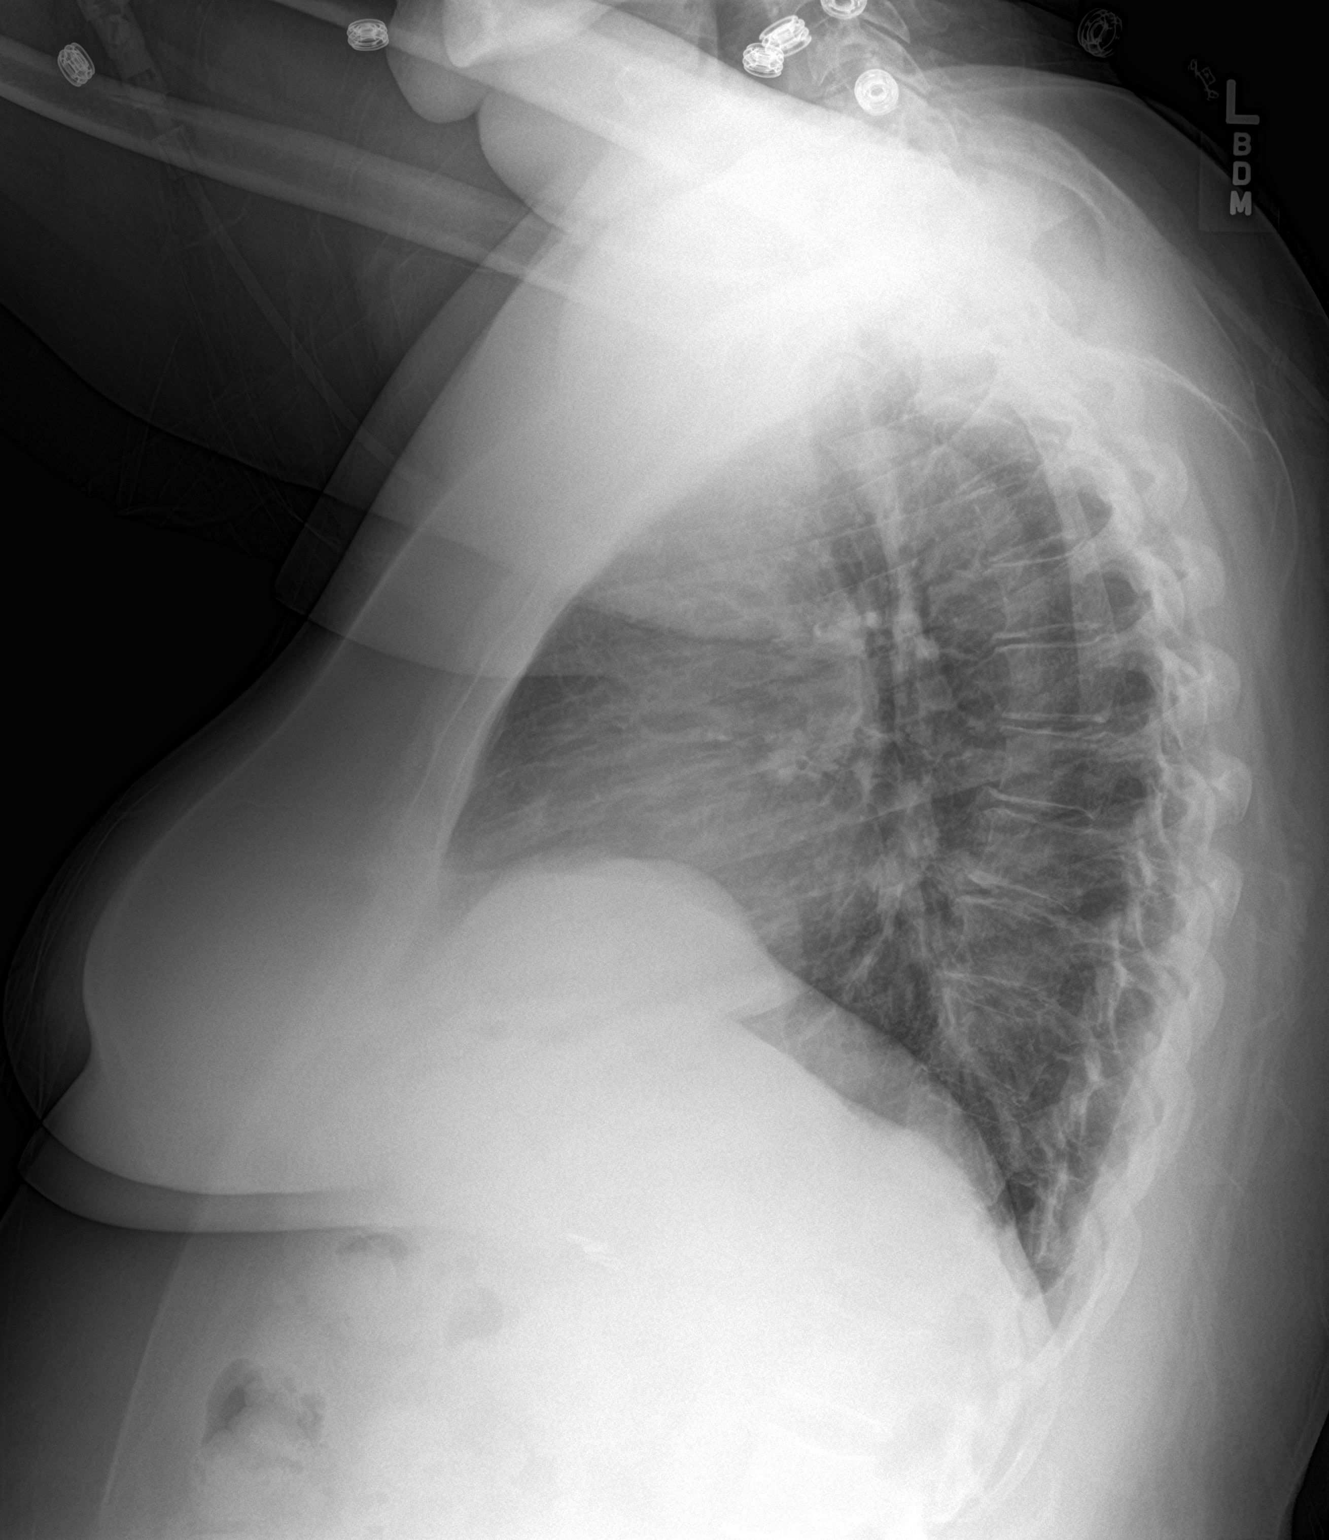

[2 of 2 positions shown; findings below may reference images not displayed]

FINDINGS: Mediastinum and hilar structures normal. Cardiomegaly with normal
pulmonary vascularity. Questionable density is noted over the
anterior lung bases on lateral view. Follow-up PA lateral chest
x-ray is suggested. This density persists CT can be obtained.
IMPRESSION: 1. Questionable density is noted over the anterior aspect of the
lower lung bases on lateral view. Follow-up chest x-ray suggested
for further evaluation. This density persists nonenhanced chest CT
is suggested to further evaluate.

2. Cardiomegaly.  No pulmonary venous congestion.

## 2018-07-05 IMAGING — CR DG CHEST 2V
2 series · 2 of 2 positions shown · non-contrast
Comparison: September 22, 2016

CLINICAL DATA: Cough.  Hypertension.

EXAM:
CHEST  2 VIEW

[chest pa]
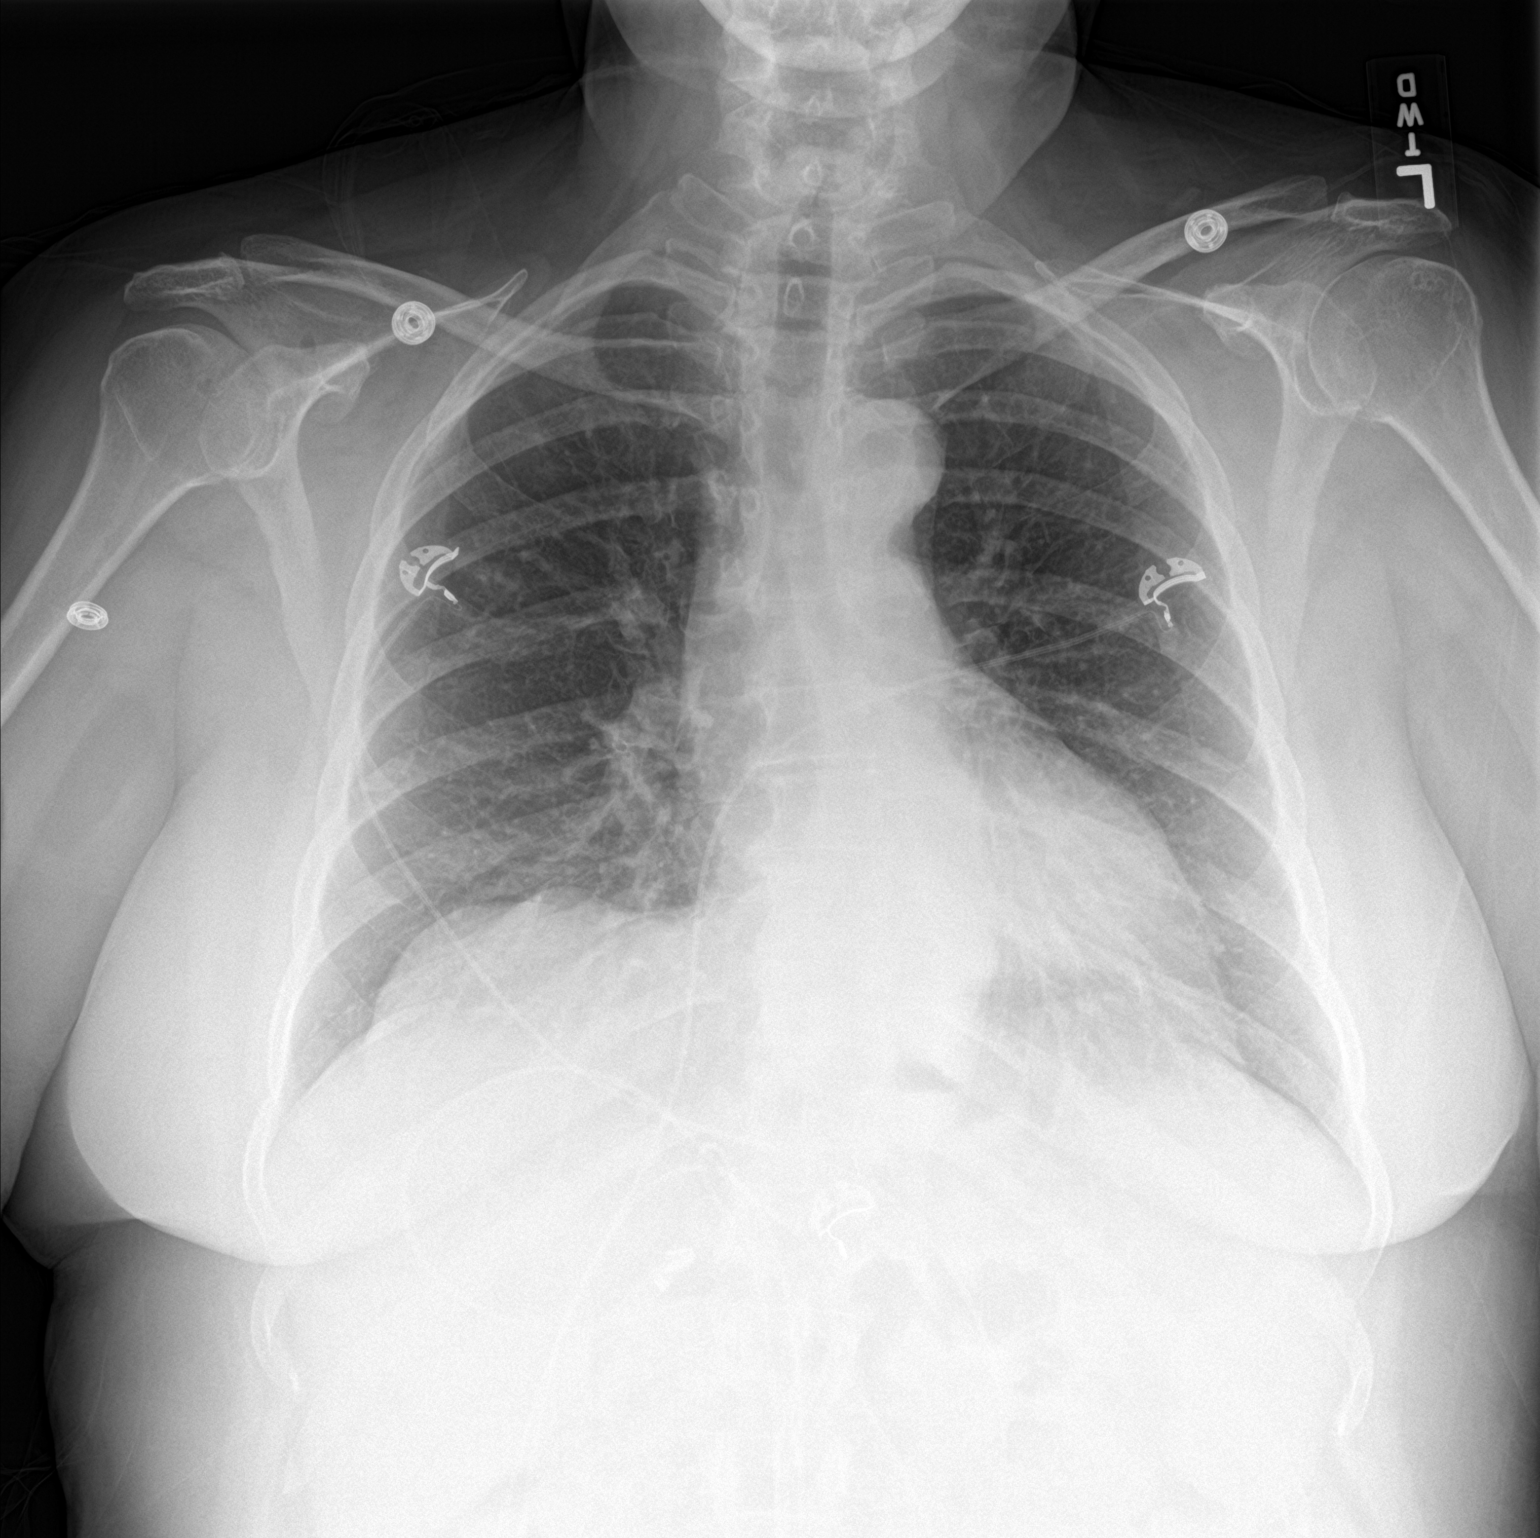

[chest lat]
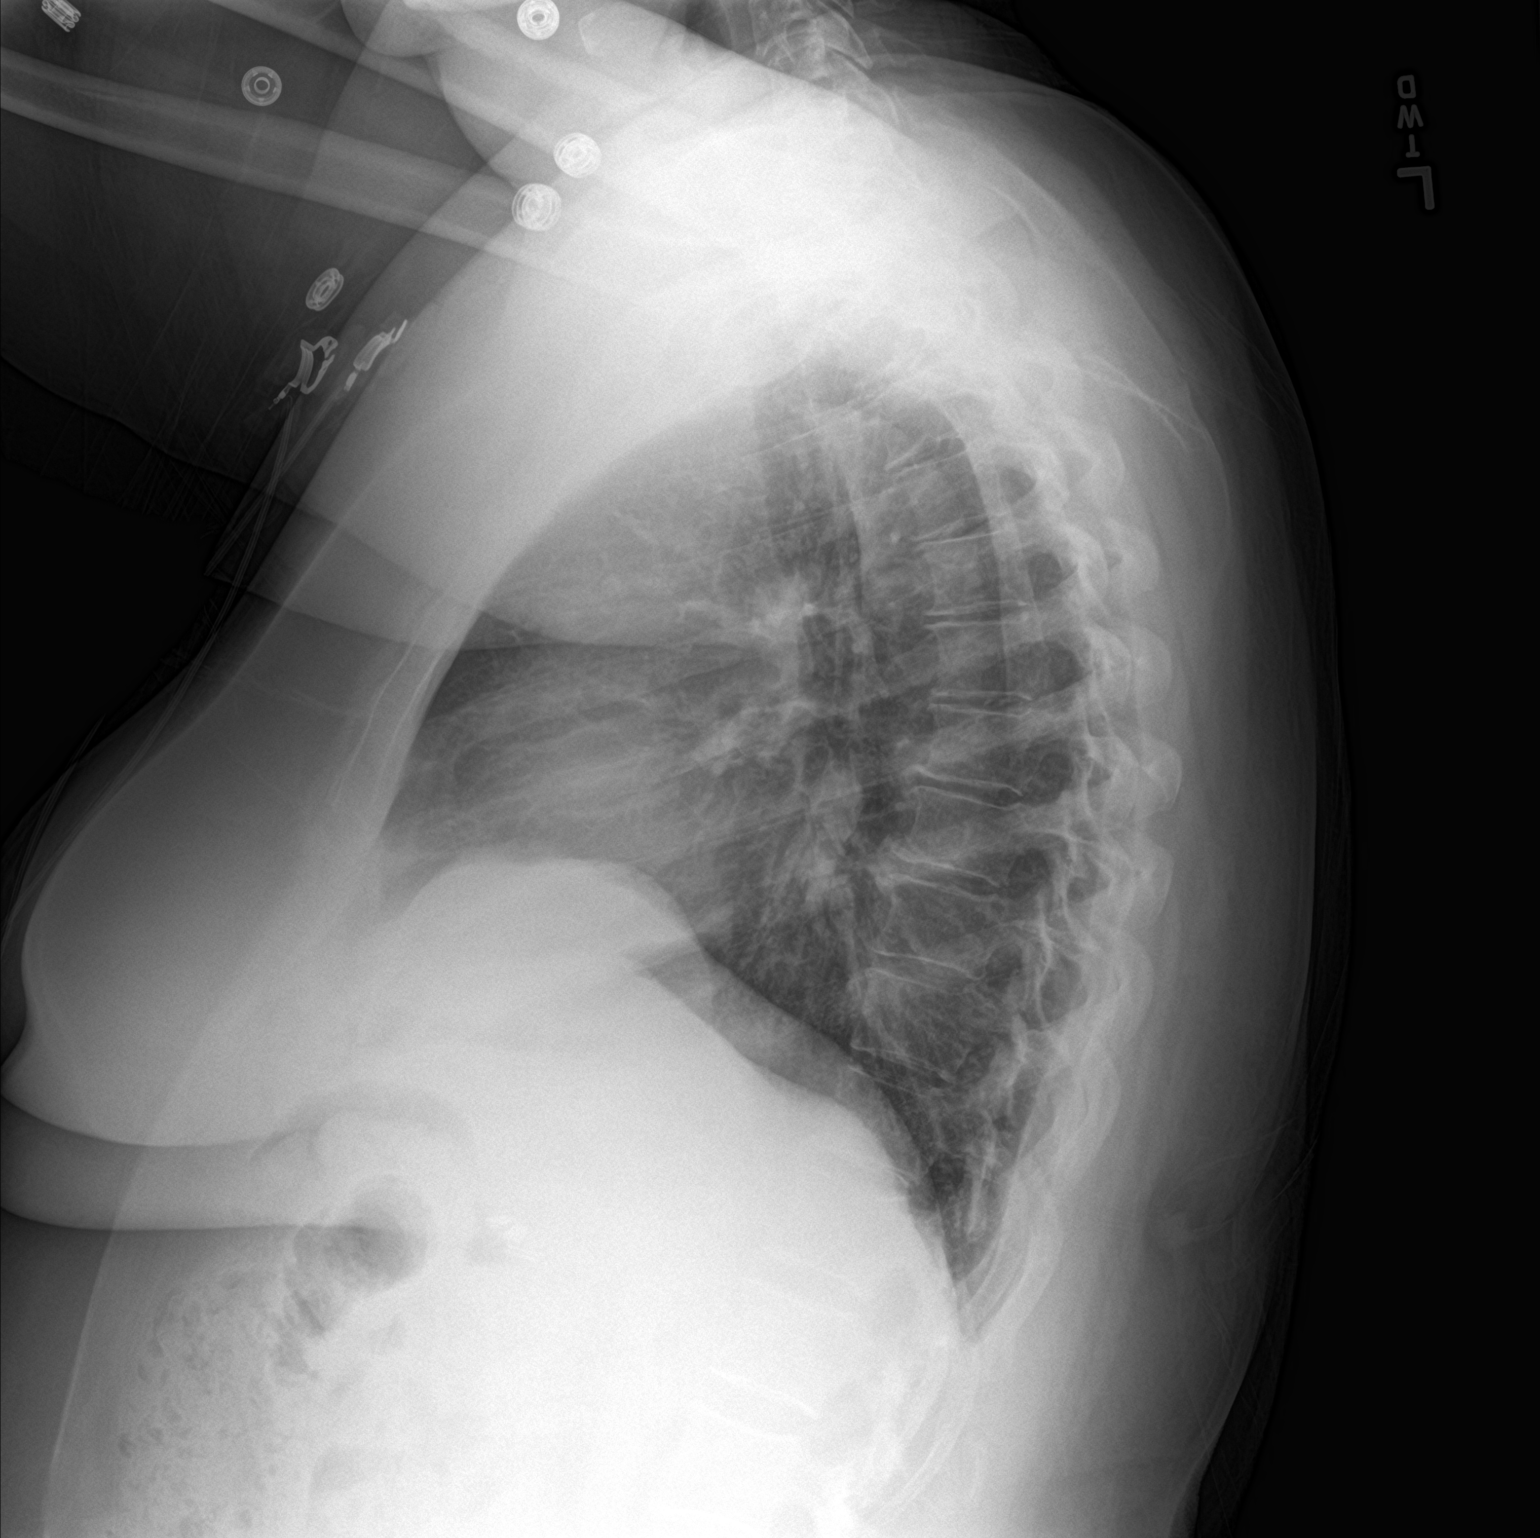

[2 of 2 positions shown; findings below may reference images not displayed]

FINDINGS: There is no edema or consolidation. Heart size and pulmonary
vascular normal. No adenopathy. There is aortic atherosclerosis. No
bone lesions.
IMPRESSION: No edema or consolidation. The ill-defined opacity overlying the
heart anteriorly on the lateral view is no longer evident. Stable
cardiac silhouette. There is aortic atherosclerosis.

## 2018-07-12 DIAGNOSIS — Z7901 Long term (current) use of anticoagulants: Secondary | ICD-10-CM | POA: Diagnosis not present

## 2018-07-18 DIAGNOSIS — Z7901 Long term (current) use of anticoagulants: Secondary | ICD-10-CM | POA: Diagnosis not present

## 2018-07-18 DIAGNOSIS — I129 Hypertensive chronic kidney disease with stage 1 through stage 4 chronic kidney disease, or unspecified chronic kidney disease: Secondary | ICD-10-CM | POA: Diagnosis not present

## 2018-07-18 DIAGNOSIS — Z7682 Awaiting organ transplant status: Secondary | ICD-10-CM | POA: Diagnosis not present

## 2018-07-18 DIAGNOSIS — Z6838 Body mass index (BMI) 38.0-38.9, adult: Secondary | ICD-10-CM | POA: Diagnosis not present

## 2018-07-18 DIAGNOSIS — R0789 Other chest pain: Secondary | ICD-10-CM | POA: Diagnosis not present

## 2018-07-18 DIAGNOSIS — R0602 Shortness of breath: Secondary | ICD-10-CM | POA: Diagnosis not present

## 2018-07-18 DIAGNOSIS — I4891 Unspecified atrial fibrillation: Secondary | ICD-10-CM | POA: Diagnosis not present

## 2018-07-18 DIAGNOSIS — R7989 Other specified abnormal findings of blood chemistry: Secondary | ICD-10-CM | POA: Diagnosis not present

## 2018-07-18 DIAGNOSIS — N184 Chronic kidney disease, stage 4 (severe): Secondary | ICD-10-CM | POA: Diagnosis not present

## 2018-07-18 DIAGNOSIS — R05 Cough: Secondary | ICD-10-CM | POA: Diagnosis not present

## 2018-07-18 DIAGNOSIS — Z86711 Personal history of pulmonary embolism: Secondary | ICD-10-CM | POA: Diagnosis not present

## 2018-07-18 DIAGNOSIS — R079 Chest pain, unspecified: Secondary | ICD-10-CM | POA: Diagnosis not present

## 2018-07-19 DIAGNOSIS — M7989 Other specified soft tissue disorders: Secondary | ICD-10-CM | POA: Diagnosis not present

## 2018-07-19 DIAGNOSIS — R7989 Other specified abnormal findings of blood chemistry: Secondary | ICD-10-CM | POA: Diagnosis not present

## 2018-07-19 DIAGNOSIS — J90 Pleural effusion, not elsewhere classified: Secondary | ICD-10-CM | POA: Diagnosis not present

## 2018-07-19 DIAGNOSIS — R0602 Shortness of breath: Secondary | ICD-10-CM | POA: Diagnosis not present

## 2018-07-20 DIAGNOSIS — R0602 Shortness of breath: Secondary | ICD-10-CM | POA: Diagnosis not present

## 2018-07-20 DIAGNOSIS — R7989 Other specified abnormal findings of blood chemistry: Secondary | ICD-10-CM | POA: Diagnosis not present

## 2018-07-21 DIAGNOSIS — R7989 Other specified abnormal findings of blood chemistry: Secondary | ICD-10-CM | POA: Diagnosis not present

## 2018-07-21 DIAGNOSIS — R0602 Shortness of breath: Secondary | ICD-10-CM | POA: Diagnosis not present

## 2018-07-26 DIAGNOSIS — Z09 Encounter for follow-up examination after completed treatment for conditions other than malignant neoplasm: Secondary | ICD-10-CM | POA: Diagnosis not present

## 2018-07-26 DIAGNOSIS — Z6838 Body mass index (BMI) 38.0-38.9, adult: Secondary | ICD-10-CM | POA: Diagnosis not present

## 2018-07-26 DIAGNOSIS — Z1339 Encounter for screening examination for other mental health and behavioral disorders: Secondary | ICD-10-CM | POA: Diagnosis not present

## 2018-07-26 DIAGNOSIS — I2699 Other pulmonary embolism without acute cor pulmonale: Secondary | ICD-10-CM | POA: Diagnosis not present

## 2018-07-26 DIAGNOSIS — Z7901 Long term (current) use of anticoagulants: Secondary | ICD-10-CM | POA: Diagnosis not present

## 2018-08-09 DIAGNOSIS — Z7901 Long term (current) use of anticoagulants: Secondary | ICD-10-CM | POA: Diagnosis not present

## 2018-08-12 DIAGNOSIS — N185 Chronic kidney disease, stage 5: Secondary | ICD-10-CM | POA: Diagnosis not present

## 2018-08-12 DIAGNOSIS — N186 End stage renal disease: Secondary | ICD-10-CM | POA: Diagnosis not present

## 2018-08-12 DIAGNOSIS — Z713 Dietary counseling and surveillance: Secondary | ICD-10-CM | POA: Diagnosis not present

## 2018-08-12 DIAGNOSIS — F329 Major depressive disorder, single episode, unspecified: Secondary | ICD-10-CM | POA: Diagnosis not present

## 2018-08-12 DIAGNOSIS — Z01818 Encounter for other preprocedural examination: Secondary | ICD-10-CM | POA: Diagnosis not present

## 2018-08-12 DIAGNOSIS — Z7682 Awaiting organ transplant status: Secondary | ICD-10-CM | POA: Diagnosis not present

## 2018-08-12 DIAGNOSIS — R9431 Abnormal electrocardiogram [ECG] [EKG]: Secondary | ICD-10-CM | POA: Diagnosis not present

## 2018-08-12 DIAGNOSIS — Z992 Dependence on renal dialysis: Secondary | ICD-10-CM | POA: Diagnosis not present

## 2018-08-12 DIAGNOSIS — R0789 Other chest pain: Secondary | ICD-10-CM | POA: Diagnosis not present

## 2018-08-12 DIAGNOSIS — T471X5D Adverse effect of other antacids and anti-gastric-secretion drugs, subsequent encounter: Secondary | ICD-10-CM | POA: Diagnosis not present

## 2018-08-12 DIAGNOSIS — Z6839 Body mass index (BMI) 39.0-39.9, adult: Secondary | ICD-10-CM | POA: Diagnosis not present

## 2018-08-12 DIAGNOSIS — E1122 Type 2 diabetes mellitus with diabetic chronic kidney disease: Secondary | ICD-10-CM | POA: Diagnosis not present

## 2018-08-12 DIAGNOSIS — R5383 Other fatigue: Secondary | ICD-10-CM | POA: Diagnosis not present

## 2018-08-16 DIAGNOSIS — I2699 Other pulmonary embolism without acute cor pulmonale: Secondary | ICD-10-CM | POA: Diagnosis not present

## 2018-08-16 DIAGNOSIS — I82409 Acute embolism and thrombosis of unspecified deep veins of unspecified lower extremity: Secondary | ICD-10-CM | POA: Diagnosis not present

## 2018-08-16 DIAGNOSIS — N184 Chronic kidney disease, stage 4 (severe): Secondary | ICD-10-CM | POA: Diagnosis not present

## 2018-08-16 DIAGNOSIS — E669 Obesity, unspecified: Secondary | ICD-10-CM | POA: Diagnosis not present

## 2018-08-16 DIAGNOSIS — I129 Hypertensive chronic kidney disease with stage 1 through stage 4 chronic kidney disease, or unspecified chronic kidney disease: Secondary | ICD-10-CM | POA: Diagnosis not present

## 2018-08-23 DIAGNOSIS — Z7901 Long term (current) use of anticoagulants: Secondary | ICD-10-CM | POA: Diagnosis not present

## 2018-08-23 DIAGNOSIS — N390 Urinary tract infection, site not specified: Secondary | ICD-10-CM | POA: Diagnosis not present

## 2018-09-02 DIAGNOSIS — K219 Gastro-esophageal reflux disease without esophagitis: Secondary | ICD-10-CM | POA: Diagnosis not present

## 2018-09-02 DIAGNOSIS — K5730 Diverticulosis of large intestine without perforation or abscess without bleeding: Secondary | ICD-10-CM | POA: Diagnosis not present

## 2018-09-06 DIAGNOSIS — Z7901 Long term (current) use of anticoagulants: Secondary | ICD-10-CM | POA: Diagnosis not present

## 2018-09-14 DIAGNOSIS — Z7901 Long term (current) use of anticoagulants: Secondary | ICD-10-CM | POA: Diagnosis not present

## 2018-09-22 DIAGNOSIS — Z7901 Long term (current) use of anticoagulants: Secondary | ICD-10-CM | POA: Diagnosis not present

## 2018-09-23 DIAGNOSIS — D126 Benign neoplasm of colon, unspecified: Secondary | ICD-10-CM | POA: Diagnosis not present

## 2018-09-23 DIAGNOSIS — I129 Hypertensive chronic kidney disease with stage 1 through stage 4 chronic kidney disease, or unspecified chronic kidney disease: Secondary | ICD-10-CM | POA: Diagnosis not present

## 2018-09-23 DIAGNOSIS — K219 Gastro-esophageal reflux disease without esophagitis: Secondary | ICD-10-CM | POA: Diagnosis not present

## 2018-09-23 DIAGNOSIS — I120 Hypertensive chronic kidney disease with stage 5 chronic kidney disease or end stage renal disease: Secondary | ICD-10-CM | POA: Diagnosis not present

## 2018-09-23 DIAGNOSIS — D122 Benign neoplasm of ascending colon: Secondary | ICD-10-CM | POA: Diagnosis not present

## 2018-09-23 DIAGNOSIS — F329 Major depressive disorder, single episode, unspecified: Secondary | ICD-10-CM | POA: Diagnosis not present

## 2018-09-23 DIAGNOSIS — K635 Polyp of colon: Secondary | ICD-10-CM | POA: Diagnosis not present

## 2018-09-23 DIAGNOSIS — Z7901 Long term (current) use of anticoagulants: Secondary | ICD-10-CM | POA: Diagnosis not present

## 2018-09-23 DIAGNOSIS — N185 Chronic kidney disease, stage 5: Secondary | ICD-10-CM | POA: Diagnosis not present

## 2018-09-23 DIAGNOSIS — E039 Hypothyroidism, unspecified: Secondary | ICD-10-CM | POA: Diagnosis not present

## 2018-09-23 DIAGNOSIS — N189 Chronic kidney disease, unspecified: Secondary | ICD-10-CM | POA: Diagnosis not present

## 2018-09-23 DIAGNOSIS — Z1211 Encounter for screening for malignant neoplasm of colon: Secondary | ICD-10-CM | POA: Diagnosis not present

## 2018-09-23 DIAGNOSIS — D124 Benign neoplasm of descending colon: Secondary | ICD-10-CM | POA: Diagnosis not present

## 2018-09-28 DIAGNOSIS — Z7901 Long term (current) use of anticoagulants: Secondary | ICD-10-CM | POA: Diagnosis not present

## 2018-10-14 DIAGNOSIS — Z1231 Encounter for screening mammogram for malignant neoplasm of breast: Secondary | ICD-10-CM | POA: Diagnosis not present

## 2018-10-19 DIAGNOSIS — Z7901 Long term (current) use of anticoagulants: Secondary | ICD-10-CM | POA: Diagnosis not present

## 2018-10-24 DIAGNOSIS — M791 Myalgia, unspecified site: Secondary | ICD-10-CM | POA: Diagnosis not present

## 2018-10-24 DIAGNOSIS — M549 Dorsalgia, unspecified: Secondary | ICD-10-CM | POA: Diagnosis not present

## 2018-10-24 DIAGNOSIS — B9689 Other specified bacterial agents as the cause of diseases classified elsewhere: Secondary | ICD-10-CM | POA: Diagnosis not present

## 2018-10-24 DIAGNOSIS — R109 Unspecified abdominal pain: Secondary | ICD-10-CM | POA: Diagnosis not present

## 2018-10-24 DIAGNOSIS — N3000 Acute cystitis without hematuria: Secondary | ICD-10-CM | POA: Diagnosis not present

## 2018-11-03 DIAGNOSIS — N3001 Acute cystitis with hematuria: Secondary | ICD-10-CM | POA: Diagnosis not present

## 2018-11-03 DIAGNOSIS — N3090 Cystitis, unspecified without hematuria: Secondary | ICD-10-CM | POA: Diagnosis not present

## 2018-11-03 DIAGNOSIS — M549 Dorsalgia, unspecified: Secondary | ICD-10-CM | POA: Diagnosis not present

## 2018-11-08 DIAGNOSIS — R05 Cough: Secondary | ICD-10-CM | POA: Diagnosis not present

## 2018-11-08 DIAGNOSIS — J069 Acute upper respiratory infection, unspecified: Secondary | ICD-10-CM | POA: Diagnosis not present

## 2018-11-15 DIAGNOSIS — I1310 Hypertensive heart and chronic kidney disease without heart failure, with stage 1 through stage 4 chronic kidney disease, or unspecified chronic kidney disease: Secondary | ICD-10-CM | POA: Diagnosis not present

## 2018-11-15 DIAGNOSIS — R0789 Other chest pain: Secondary | ICD-10-CM | POA: Diagnosis not present

## 2018-11-15 DIAGNOSIS — Z7682 Awaiting organ transplant status: Secondary | ICD-10-CM | POA: Diagnosis not present

## 2018-11-15 DIAGNOSIS — K449 Diaphragmatic hernia without obstruction or gangrene: Secondary | ICD-10-CM | POA: Diagnosis not present

## 2018-11-15 DIAGNOSIS — E785 Hyperlipidemia, unspecified: Secondary | ICD-10-CM | POA: Diagnosis not present

## 2018-11-15 DIAGNOSIS — E039 Hypothyroidism, unspecified: Secondary | ICD-10-CM | POA: Diagnosis not present

## 2018-11-15 DIAGNOSIS — N189 Chronic kidney disease, unspecified: Secondary | ICD-10-CM | POA: Diagnosis not present

## 2018-11-15 DIAGNOSIS — Z01810 Encounter for preprocedural cardiovascular examination: Secondary | ICD-10-CM | POA: Diagnosis not present

## 2018-11-15 DIAGNOSIS — I10 Essential (primary) hypertension: Secondary | ICD-10-CM | POA: Diagnosis not present

## 2018-11-15 DIAGNOSIS — I480 Paroxysmal atrial fibrillation: Secondary | ICD-10-CM | POA: Diagnosis not present

## 2018-11-15 DIAGNOSIS — I700 Atherosclerosis of aorta: Secondary | ICD-10-CM | POA: Diagnosis not present

## 2018-11-17 DIAGNOSIS — D699 Hemorrhagic condition, unspecified: Secondary | ICD-10-CM | POA: Diagnosis not present

## 2018-11-17 DIAGNOSIS — Z7901 Long term (current) use of anticoagulants: Secondary | ICD-10-CM | POA: Diagnosis not present

## 2018-11-17 DIAGNOSIS — Z6839 Body mass index (BMI) 39.0-39.9, adult: Secondary | ICD-10-CM | POA: Diagnosis not present

## 2018-11-17 DIAGNOSIS — Z86718 Personal history of other venous thrombosis and embolism: Secondary | ICD-10-CM | POA: Diagnosis not present

## 2018-11-17 DIAGNOSIS — Z7682 Awaiting organ transplant status: Secondary | ICD-10-CM | POA: Diagnosis not present

## 2018-11-17 DIAGNOSIS — N186 End stage renal disease: Secondary | ICD-10-CM | POA: Diagnosis not present

## 2018-11-17 DIAGNOSIS — T471X5A Adverse effect of other antacids and anti-gastric-secretion drugs, initial encounter: Secondary | ICD-10-CM | POA: Diagnosis not present

## 2018-11-17 DIAGNOSIS — Z86711 Personal history of pulmonary embolism: Secondary | ICD-10-CM | POA: Diagnosis not present

## 2018-11-19 DIAGNOSIS — E039 Hypothyroidism, unspecified: Secondary | ICD-10-CM | POA: Diagnosis not present

## 2018-11-26 DIAGNOSIS — E785 Hyperlipidemia, unspecified: Secondary | ICD-10-CM | POA: Diagnosis not present

## 2018-11-26 DIAGNOSIS — Z0000 Encounter for general adult medical examination without abnormal findings: Secondary | ICD-10-CM | POA: Diagnosis not present

## 2018-11-26 DIAGNOSIS — I10 Essential (primary) hypertension: Secondary | ICD-10-CM | POA: Diagnosis not present

## 2018-11-26 DIAGNOSIS — N184 Chronic kidney disease, stage 4 (severe): Secondary | ICD-10-CM | POA: Diagnosis not present

## 2018-11-26 DIAGNOSIS — Z1331 Encounter for screening for depression: Secondary | ICD-10-CM | POA: Diagnosis not present

## 2018-11-26 DIAGNOSIS — E039 Hypothyroidism, unspecified: Secondary | ICD-10-CM | POA: Diagnosis not present

## 2018-11-26 DIAGNOSIS — Z6839 Body mass index (BMI) 39.0-39.9, adult: Secondary | ICD-10-CM | POA: Diagnosis not present

## 2018-12-28 DIAGNOSIS — M1711 Unilateral primary osteoarthritis, right knee: Secondary | ICD-10-CM | POA: Diagnosis not present

## 2018-12-28 DIAGNOSIS — M1712 Unilateral primary osteoarthritis, left knee: Secondary | ICD-10-CM | POA: Diagnosis not present

## 2019-01-23 DIAGNOSIS — N3001 Acute cystitis with hematuria: Secondary | ICD-10-CM | POA: Diagnosis not present

## 2019-01-23 DIAGNOSIS — N3090 Cystitis, unspecified without hematuria: Secondary | ICD-10-CM | POA: Diagnosis not present

## 2019-01-28 DIAGNOSIS — N3090 Cystitis, unspecified without hematuria: Secondary | ICD-10-CM | POA: Diagnosis not present

## 2019-01-28 DIAGNOSIS — Z6839 Body mass index (BMI) 39.0-39.9, adult: Secondary | ICD-10-CM | POA: Diagnosis not present

## 2019-03-01 DIAGNOSIS — Z7901 Long term (current) use of anticoagulants: Secondary | ICD-10-CM | POA: Diagnosis not present

## 2019-03-04 DIAGNOSIS — E039 Hypothyroidism, unspecified: Secondary | ICD-10-CM | POA: Diagnosis not present

## 2019-03-04 DIAGNOSIS — T148XXA Other injury of unspecified body region, initial encounter: Secondary | ICD-10-CM | POA: Diagnosis not present

## 2019-03-04 DIAGNOSIS — Z6839 Body mass index (BMI) 39.0-39.9, adult: Secondary | ICD-10-CM | POA: Diagnosis not present

## 2019-03-28 DIAGNOSIS — S8012XA Contusion of left lower leg, initial encounter: Secondary | ICD-10-CM | POA: Diagnosis not present

## 2019-04-07 DIAGNOSIS — Z6841 Body Mass Index (BMI) 40.0 and over, adult: Secondary | ICD-10-CM | POA: Diagnosis not present

## 2019-04-07 DIAGNOSIS — S8992XS Unspecified injury of left lower leg, sequela: Secondary | ICD-10-CM | POA: Diagnosis not present

## 2019-04-07 DIAGNOSIS — N183 Chronic kidney disease, stage 3 (moderate): Secondary | ICD-10-CM | POA: Diagnosis not present

## 2019-04-07 DIAGNOSIS — I890 Lymphedema, not elsewhere classified: Secondary | ICD-10-CM | POA: Diagnosis not present

## 2019-04-07 DIAGNOSIS — M79662 Pain in left lower leg: Secondary | ICD-10-CM | POA: Diagnosis not present

## 2019-04-07 DIAGNOSIS — Z86718 Personal history of other venous thrombosis and embolism: Secondary | ICD-10-CM | POA: Diagnosis not present

## 2019-04-07 DIAGNOSIS — M7989 Other specified soft tissue disorders: Secondary | ICD-10-CM | POA: Diagnosis not present

## 2019-04-19 DIAGNOSIS — H52209 Unspecified astigmatism, unspecified eye: Secondary | ICD-10-CM | POA: Diagnosis not present

## 2019-04-19 DIAGNOSIS — Z7682 Awaiting organ transplant status: Secondary | ICD-10-CM | POA: Diagnosis not present

## 2019-04-19 DIAGNOSIS — H5213 Myopia, bilateral: Secondary | ICD-10-CM | POA: Diagnosis not present

## 2019-04-19 DIAGNOSIS — N183 Chronic kidney disease, stage 3 (moderate): Secondary | ICD-10-CM | POA: Diagnosis not present

## 2019-04-19 DIAGNOSIS — H524 Presbyopia: Secondary | ICD-10-CM | POA: Diagnosis not present

## 2019-04-19 DIAGNOSIS — N186 End stage renal disease: Secondary | ICD-10-CM | POA: Diagnosis not present

## 2019-04-19 DIAGNOSIS — H5211 Myopia, right eye: Secondary | ICD-10-CM | POA: Diagnosis not present

## 2019-05-04 DIAGNOSIS — N189 Chronic kidney disease, unspecified: Secondary | ICD-10-CM | POA: Diagnosis not present

## 2019-05-04 DIAGNOSIS — R5383 Other fatigue: Secondary | ICD-10-CM | POA: Diagnosis not present

## 2019-05-04 DIAGNOSIS — I129 Hypertensive chronic kidney disease with stage 1 through stage 4 chronic kidney disease, or unspecified chronic kidney disease: Secondary | ICD-10-CM | POA: Diagnosis not present

## 2019-05-04 DIAGNOSIS — E039 Hypothyroidism, unspecified: Secondary | ICD-10-CM | POA: Diagnosis not present

## 2019-05-04 DIAGNOSIS — I509 Heart failure, unspecified: Secondary | ICD-10-CM | POA: Diagnosis not present

## 2019-05-04 DIAGNOSIS — N184 Chronic kidney disease, stage 4 (severe): Secondary | ICD-10-CM | POA: Diagnosis not present

## 2019-05-04 DIAGNOSIS — D631 Anemia in chronic kidney disease: Secondary | ICD-10-CM | POA: Diagnosis not present

## 2019-05-04 DIAGNOSIS — I130 Hypertensive heart and chronic kidney disease with heart failure and stage 1 through stage 4 chronic kidney disease, or unspecified chronic kidney disease: Secondary | ICD-10-CM | POA: Diagnosis not present

## 2019-05-04 DIAGNOSIS — I110 Hypertensive heart disease with heart failure: Secondary | ICD-10-CM | POA: Diagnosis not present

## 2019-05-30 DIAGNOSIS — N183 Chronic kidney disease, stage 3 (moderate): Secondary | ICD-10-CM | POA: Diagnosis not present

## 2019-06-08 DIAGNOSIS — I10 Essential (primary) hypertension: Secondary | ICD-10-CM | POA: Diagnosis not present

## 2019-06-08 DIAGNOSIS — E785 Hyperlipidemia, unspecified: Secondary | ICD-10-CM | POA: Diagnosis not present

## 2019-06-16 DIAGNOSIS — J40 Bronchitis, not specified as acute or chronic: Secondary | ICD-10-CM | POA: Diagnosis not present

## 2019-06-16 DIAGNOSIS — I10 Essential (primary) hypertension: Secondary | ICD-10-CM | POA: Diagnosis not present

## 2019-06-16 DIAGNOSIS — J329 Chronic sinusitis, unspecified: Secondary | ICD-10-CM | POA: Diagnosis not present

## 2019-06-21 DIAGNOSIS — Z6839 Body mass index (BMI) 39.0-39.9, adult: Secondary | ICD-10-CM | POA: Diagnosis not present

## 2019-06-21 DIAGNOSIS — R300 Dysuria: Secondary | ICD-10-CM | POA: Diagnosis not present

## 2019-07-09 DIAGNOSIS — E039 Hypothyroidism, unspecified: Secondary | ICD-10-CM | POA: Diagnosis not present

## 2019-07-09 DIAGNOSIS — E785 Hyperlipidemia, unspecified: Secondary | ICD-10-CM | POA: Diagnosis not present

## 2019-07-18 DIAGNOSIS — Z23 Encounter for immunization: Secondary | ICD-10-CM | POA: Diagnosis not present

## 2019-07-18 DIAGNOSIS — Z7682 Awaiting organ transplant status: Secondary | ICD-10-CM | POA: Diagnosis not present

## 2019-07-18 DIAGNOSIS — N184 Chronic kidney disease, stage 4 (severe): Secondary | ICD-10-CM | POA: Diagnosis not present

## 2019-08-16 DIAGNOSIS — Z20828 Contact with and (suspected) exposure to other viral communicable diseases: Secondary | ICD-10-CM | POA: Diagnosis not present

## 2019-08-16 DIAGNOSIS — J40 Bronchitis, not specified as acute or chronic: Secondary | ICD-10-CM | POA: Diagnosis not present

## 2019-08-16 DIAGNOSIS — J329 Chronic sinusitis, unspecified: Secondary | ICD-10-CM | POA: Diagnosis not present

## 2019-08-25 DIAGNOSIS — N184 Chronic kidney disease, stage 4 (severe): Secondary | ICD-10-CM | POA: Diagnosis not present

## 2019-08-31 DIAGNOSIS — I639 Cerebral infarction, unspecified: Secondary | ICD-10-CM | POA: Diagnosis not present

## 2019-08-31 DIAGNOSIS — R55 Syncope and collapse: Secondary | ICD-10-CM | POA: Diagnosis not present

## 2019-08-31 DIAGNOSIS — R29818 Other symptoms and signs involving the nervous system: Secondary | ICD-10-CM | POA: Diagnosis not present

## 2019-08-31 DIAGNOSIS — R531 Weakness: Secondary | ICD-10-CM | POA: Diagnosis not present

## 2019-09-01 DIAGNOSIS — R42 Dizziness and giddiness: Secondary | ICD-10-CM | POA: Diagnosis not present

## 2019-09-01 DIAGNOSIS — I639 Cerebral infarction, unspecified: Secondary | ICD-10-CM | POA: Diagnosis not present

## 2019-09-01 DIAGNOSIS — E039 Hypothyroidism, unspecified: Secondary | ICD-10-CM | POA: Diagnosis not present

## 2019-09-01 DIAGNOSIS — N189 Chronic kidney disease, unspecified: Secondary | ICD-10-CM | POA: Diagnosis not present

## 2019-09-01 DIAGNOSIS — I10 Essential (primary) hypertension: Secondary | ICD-10-CM | POA: Diagnosis not present

## 2019-09-01 DIAGNOSIS — E785 Hyperlipidemia, unspecified: Secondary | ICD-10-CM | POA: Diagnosis not present

## 2019-09-01 DIAGNOSIS — R4781 Slurred speech: Secondary | ICD-10-CM | POA: Diagnosis not present

## 2019-09-01 DIAGNOSIS — N183 Chronic kidney disease, stage 3 unspecified: Secondary | ICD-10-CM | POA: Diagnosis not present

## 2019-09-01 DIAGNOSIS — Z8673 Personal history of transient ischemic attack (TIA), and cerebral infarction without residual deficits: Secondary | ICD-10-CM | POA: Diagnosis not present

## 2019-09-01 DIAGNOSIS — R55 Syncope and collapse: Secondary | ICD-10-CM | POA: Diagnosis not present

## 2019-09-01 DIAGNOSIS — Z9282 Status post administration of tPA (rtPA) in a different facility within the last 24 hours prior to admission to current facility: Secondary | ICD-10-CM | POA: Diagnosis not present

## 2019-09-01 DIAGNOSIS — G43109 Migraine with aura, not intractable, without status migrainosus: Secondary | ICD-10-CM | POA: Diagnosis not present

## 2019-09-01 DIAGNOSIS — I4891 Unspecified atrial fibrillation: Secondary | ICD-10-CM | POA: Diagnosis not present

## 2019-09-01 DIAGNOSIS — R519 Headache, unspecified: Secondary | ICD-10-CM | POA: Diagnosis not present

## 2019-09-01 DIAGNOSIS — I129 Hypertensive chronic kidney disease with stage 1 through stage 4 chronic kidney disease, or unspecified chronic kidney disease: Secondary | ICD-10-CM | POA: Diagnosis not present

## 2019-09-01 DIAGNOSIS — R531 Weakness: Secondary | ICD-10-CM | POA: Diagnosis not present

## 2019-09-01 DIAGNOSIS — R202 Paresthesia of skin: Secondary | ICD-10-CM | POA: Diagnosis not present

## 2019-09-01 DIAGNOSIS — I480 Paroxysmal atrial fibrillation: Secondary | ICD-10-CM | POA: Diagnosis not present

## 2019-09-02 DIAGNOSIS — G43109 Migraine with aura, not intractable, without status migrainosus: Secondary | ICD-10-CM | POA: Diagnosis not present

## 2019-09-02 DIAGNOSIS — E039 Hypothyroidism, unspecified: Secondary | ICD-10-CM | POA: Diagnosis not present

## 2019-09-02 DIAGNOSIS — N183 Chronic kidney disease, stage 3 unspecified: Secondary | ICD-10-CM | POA: Diagnosis not present

## 2019-09-02 DIAGNOSIS — I10 Essential (primary) hypertension: Secondary | ICD-10-CM | POA: Diagnosis not present

## 2019-09-02 DIAGNOSIS — E785 Hyperlipidemia, unspecified: Secondary | ICD-10-CM | POA: Diagnosis not present

## 2019-09-02 DIAGNOSIS — Z9282 Status post administration of tPA (rtPA) in a different facility within the last 24 hours prior to admission to current facility: Secondary | ICD-10-CM | POA: Diagnosis not present

## 2019-09-13 DIAGNOSIS — Z6841 Body Mass Index (BMI) 40.0 and over, adult: Secondary | ICD-10-CM | POA: Diagnosis not present

## 2019-09-13 DIAGNOSIS — G43419 Hemiplegic migraine, intractable, without status migrainosus: Secondary | ICD-10-CM | POA: Diagnosis not present

## 2019-09-13 DIAGNOSIS — Z9181 History of falling: Secondary | ICD-10-CM | POA: Diagnosis not present

## 2019-09-19 DIAGNOSIS — Z7901 Long term (current) use of anticoagulants: Secondary | ICD-10-CM | POA: Diagnosis not present

## 2019-09-19 DIAGNOSIS — R05 Cough: Secondary | ICD-10-CM | POA: Diagnosis not present

## 2019-09-19 DIAGNOSIS — R52 Pain, unspecified: Secondary | ICD-10-CM | POA: Diagnosis not present

## 2019-09-19 DIAGNOSIS — R0789 Other chest pain: Secondary | ICD-10-CM | POA: Diagnosis not present

## 2019-09-19 DIAGNOSIS — J189 Pneumonia, unspecified organism: Secondary | ICD-10-CM | POA: Diagnosis not present

## 2019-09-19 DIAGNOSIS — R55 Syncope and collapse: Secondary | ICD-10-CM | POA: Diagnosis not present

## 2019-09-19 DIAGNOSIS — Z03818 Encounter for observation for suspected exposure to other biological agents ruled out: Secondary | ICD-10-CM | POA: Diagnosis not present

## 2019-09-19 DIAGNOSIS — R0781 Pleurodynia: Secondary | ICD-10-CM | POA: Diagnosis not present

## 2019-09-19 DIAGNOSIS — R0902 Hypoxemia: Secondary | ICD-10-CM | POA: Diagnosis not present

## 2019-09-19 DIAGNOSIS — R0600 Dyspnea, unspecified: Secondary | ICD-10-CM | POA: Diagnosis not present

## 2019-09-19 DIAGNOSIS — R0602 Shortness of breath: Secondary | ICD-10-CM | POA: Diagnosis not present

## 2019-09-19 DIAGNOSIS — Z20828 Contact with and (suspected) exposure to other viral communicable diseases: Secondary | ICD-10-CM | POA: Diagnosis not present

## 2019-11-04 DIAGNOSIS — J40 Bronchitis, not specified as acute or chronic: Secondary | ICD-10-CM | POA: Diagnosis not present

## 2019-11-04 DIAGNOSIS — Z20828 Contact with and (suspected) exposure to other viral communicable diseases: Secondary | ICD-10-CM | POA: Diagnosis not present

## 2019-11-29 DIAGNOSIS — Z1159 Encounter for screening for other viral diseases: Secondary | ICD-10-CM | POA: Diagnosis not present

## 2019-11-29 DIAGNOSIS — I120 Hypertensive chronic kidney disease with stage 5 chronic kidney disease or end stage renal disease: Secondary | ICD-10-CM | POA: Diagnosis not present

## 2019-11-29 DIAGNOSIS — Z01818 Encounter for other preprocedural examination: Secondary | ICD-10-CM | POA: Diagnosis not present

## 2019-11-29 DIAGNOSIS — N186 End stage renal disease: Secondary | ICD-10-CM | POA: Diagnosis not present

## 2019-11-29 DIAGNOSIS — E785 Hyperlipidemia, unspecified: Secondary | ICD-10-CM | POA: Diagnosis not present

## 2019-11-29 DIAGNOSIS — T50995D Adverse effect of other drugs, medicaments and biological substances, subsequent encounter: Secondary | ICD-10-CM | POA: Diagnosis not present

## 2019-11-29 DIAGNOSIS — N12 Tubulo-interstitial nephritis, not specified as acute or chronic: Secondary | ICD-10-CM | POA: Diagnosis not present

## 2019-11-29 DIAGNOSIS — Z114 Encounter for screening for human immunodeficiency virus [HIV]: Secondary | ICD-10-CM | POA: Diagnosis not present

## 2019-11-29 DIAGNOSIS — E039 Hypothyroidism, unspecified: Secondary | ICD-10-CM | POA: Diagnosis not present

## 2019-12-01 DIAGNOSIS — Z1231 Encounter for screening mammogram for malignant neoplasm of breast: Secondary | ICD-10-CM | POA: Diagnosis not present

## 2019-12-02 DIAGNOSIS — N184 Chronic kidney disease, stage 4 (severe): Secondary | ICD-10-CM | POA: Diagnosis not present

## 2019-12-02 DIAGNOSIS — D638 Anemia in other chronic diseases classified elsewhere: Secondary | ICD-10-CM | POA: Diagnosis not present

## 2019-12-02 DIAGNOSIS — Z86711 Personal history of pulmonary embolism: Secondary | ICD-10-CM | POA: Diagnosis not present

## 2019-12-02 DIAGNOSIS — N186 End stage renal disease: Secondary | ICD-10-CM | POA: Diagnosis not present

## 2019-12-02 DIAGNOSIS — Z7682 Awaiting organ transplant status: Secondary | ICD-10-CM | POA: Diagnosis not present

## 2019-12-02 DIAGNOSIS — I120 Hypertensive chronic kidney disease with stage 5 chronic kidney disease or end stage renal disease: Secondary | ICD-10-CM | POA: Diagnosis not present

## 2019-12-02 DIAGNOSIS — Z86718 Personal history of other venous thrombosis and embolism: Secondary | ICD-10-CM | POA: Diagnosis not present

## 2019-12-02 DIAGNOSIS — E669 Obesity, unspecified: Secondary | ICD-10-CM | POA: Diagnosis not present

## 2019-12-02 DIAGNOSIS — Z7901 Long term (current) use of anticoagulants: Secondary | ICD-10-CM | POA: Diagnosis not present

## 2019-12-02 DIAGNOSIS — Z6835 Body mass index (BMI) 35.0-35.9, adult: Secondary | ICD-10-CM | POA: Diagnosis not present

## 2019-12-13 DIAGNOSIS — I130 Hypertensive heart and chronic kidney disease with heart failure and stage 1 through stage 4 chronic kidney disease, or unspecified chronic kidney disease: Secondary | ICD-10-CM | POA: Diagnosis not present

## 2019-12-13 DIAGNOSIS — I110 Hypertensive heart disease with heart failure: Secondary | ICD-10-CM | POA: Diagnosis not present

## 2019-12-13 DIAGNOSIS — I509 Heart failure, unspecified: Secondary | ICD-10-CM | POA: Diagnosis not present

## 2019-12-13 DIAGNOSIS — D631 Anemia in chronic kidney disease: Secondary | ICD-10-CM | POA: Diagnosis not present

## 2019-12-13 DIAGNOSIS — I129 Hypertensive chronic kidney disease with stage 1 through stage 4 chronic kidney disease, or unspecified chronic kidney disease: Secondary | ICD-10-CM | POA: Diagnosis not present

## 2019-12-13 DIAGNOSIS — I2699 Other pulmonary embolism without acute cor pulmonale: Secondary | ICD-10-CM | POA: Diagnosis not present

## 2019-12-13 DIAGNOSIS — N184 Chronic kidney disease, stage 4 (severe): Secondary | ICD-10-CM | POA: Diagnosis not present

## 2019-12-13 DIAGNOSIS — B379 Candidiasis, unspecified: Secondary | ICD-10-CM | POA: Diagnosis not present

## 2019-12-13 DIAGNOSIS — E039 Hypothyroidism, unspecified: Secondary | ICD-10-CM | POA: Diagnosis not present

## 2019-12-13 DIAGNOSIS — E669 Obesity, unspecified: Secondary | ICD-10-CM | POA: Diagnosis not present

## 2020-01-03 DIAGNOSIS — Z7682 Awaiting organ transplant status: Secondary | ICD-10-CM | POA: Diagnosis not present

## 2020-01-25 DIAGNOSIS — L299 Pruritus, unspecified: Secondary | ICD-10-CM | POA: Diagnosis not present

## 2020-02-16 DIAGNOSIS — K469 Unspecified abdominal hernia without obstruction or gangrene: Secondary | ICD-10-CM | POA: Diagnosis not present

## 2020-02-16 DIAGNOSIS — R05 Cough: Secondary | ICD-10-CM | POA: Diagnosis not present

## 2020-02-23 DIAGNOSIS — K432 Incisional hernia without obstruction or gangrene: Secondary | ICD-10-CM | POA: Diagnosis not present

## 2020-02-28 DIAGNOSIS — Z01812 Encounter for preprocedural laboratory examination: Secondary | ICD-10-CM | POA: Diagnosis not present

## 2020-02-28 DIAGNOSIS — Z20822 Contact with and (suspected) exposure to covid-19: Secondary | ICD-10-CM | POA: Diagnosis not present

## 2020-02-28 DIAGNOSIS — K432 Incisional hernia without obstruction or gangrene: Secondary | ICD-10-CM | POA: Diagnosis not present

## 2020-03-05 DIAGNOSIS — E039 Hypothyroidism, unspecified: Secondary | ICD-10-CM | POA: Diagnosis not present

## 2020-03-05 DIAGNOSIS — K430 Incisional hernia with obstruction, without gangrene: Secondary | ICD-10-CM | POA: Diagnosis not present

## 2020-03-05 DIAGNOSIS — E669 Obesity, unspecified: Secondary | ICD-10-CM | POA: Diagnosis not present

## 2020-03-06 DIAGNOSIS — K432 Incisional hernia without obstruction or gangrene: Secondary | ICD-10-CM | POA: Diagnosis not present

## 2020-03-06 DIAGNOSIS — K589 Irritable bowel syndrome without diarrhea: Secondary | ICD-10-CM | POA: Diagnosis not present

## 2020-03-06 DIAGNOSIS — Z7901 Long term (current) use of anticoagulants: Secondary | ICD-10-CM | POA: Diagnosis not present

## 2020-03-06 DIAGNOSIS — I4891 Unspecified atrial fibrillation: Secondary | ICD-10-CM | POA: Diagnosis not present

## 2020-03-06 DIAGNOSIS — N189 Chronic kidney disease, unspecified: Secondary | ICD-10-CM | POA: Diagnosis not present

## 2020-03-06 DIAGNOSIS — I129 Hypertensive chronic kidney disease with stage 1 through stage 4 chronic kidney disease, or unspecified chronic kidney disease: Secondary | ICD-10-CM | POA: Diagnosis not present

## 2020-03-06 DIAGNOSIS — K430 Incisional hernia with obstruction, without gangrene: Secondary | ICD-10-CM | POA: Diagnosis not present

## 2020-03-06 DIAGNOSIS — Z7902 Long term (current) use of antithrombotics/antiplatelets: Secondary | ICD-10-CM | POA: Diagnosis not present

## 2020-03-06 DIAGNOSIS — E039 Hypothyroidism, unspecified: Secondary | ICD-10-CM | POA: Diagnosis not present

## 2020-03-15 DIAGNOSIS — B373 Candidiasis of vulva and vagina: Secondary | ICD-10-CM | POA: Diagnosis not present

## 2020-03-15 DIAGNOSIS — Z6838 Body mass index (BMI) 38.0-38.9, adult: Secondary | ICD-10-CM | POA: Diagnosis not present

## 2020-05-03 DIAGNOSIS — H5203 Hypermetropia, bilateral: Secondary | ICD-10-CM | POA: Diagnosis not present

## 2020-05-03 DIAGNOSIS — H52209 Unspecified astigmatism, unspecified eye: Secondary | ICD-10-CM | POA: Diagnosis not present

## 2020-05-03 DIAGNOSIS — H5213 Myopia, bilateral: Secondary | ICD-10-CM | POA: Diagnosis not present

## 2020-05-03 DIAGNOSIS — H524 Presbyopia: Secondary | ICD-10-CM | POA: Diagnosis not present

## 2020-05-03 DIAGNOSIS — H5202 Hypermetropia, left eye: Secondary | ICD-10-CM | POA: Diagnosis not present

## 2020-05-04 DIAGNOSIS — I129 Hypertensive chronic kidney disease with stage 1 through stage 4 chronic kidney disease, or unspecified chronic kidney disease: Secondary | ICD-10-CM | POA: Diagnosis not present

## 2020-05-04 DIAGNOSIS — N189 Chronic kidney disease, unspecified: Secondary | ICD-10-CM | POA: Diagnosis not present

## 2020-05-04 DIAGNOSIS — N184 Chronic kidney disease, stage 4 (severe): Secondary | ICD-10-CM | POA: Diagnosis not present

## 2020-05-04 DIAGNOSIS — R5383 Other fatigue: Secondary | ICD-10-CM | POA: Diagnosis not present

## 2020-05-04 DIAGNOSIS — I2699 Other pulmonary embolism without acute cor pulmonale: Secondary | ICD-10-CM | POA: Diagnosis not present

## 2020-05-04 DIAGNOSIS — D631 Anemia in chronic kidney disease: Secondary | ICD-10-CM | POA: Diagnosis not present

## 2020-05-04 DIAGNOSIS — E669 Obesity, unspecified: Secondary | ICD-10-CM | POA: Diagnosis not present

## 2020-05-04 DIAGNOSIS — N2581 Secondary hyperparathyroidism of renal origin: Secondary | ICD-10-CM | POA: Diagnosis not present

## 2020-05-08 ENCOUNTER — Other Ambulatory Visit: Payer: Self-pay | Admitting: Nephrology

## 2020-05-08 DIAGNOSIS — N184 Chronic kidney disease, stage 4 (severe): Secondary | ICD-10-CM | POA: Diagnosis not present

## 2020-05-10 DIAGNOSIS — N184 Chronic kidney disease, stage 4 (severe): Secondary | ICD-10-CM | POA: Diagnosis not present

## 2020-05-11 DIAGNOSIS — B9689 Other specified bacterial agents as the cause of diseases classified elsewhere: Secondary | ICD-10-CM | POA: Diagnosis not present

## 2020-05-11 DIAGNOSIS — N760 Acute vaginitis: Secondary | ICD-10-CM | POA: Diagnosis not present

## 2020-05-11 DIAGNOSIS — Z79899 Other long term (current) drug therapy: Secondary | ICD-10-CM | POA: Diagnosis not present

## 2020-05-31 DIAGNOSIS — M8580 Other specified disorders of bone density and structure, unspecified site: Secondary | ICD-10-CM | POA: Diagnosis not present

## 2020-06-14 DIAGNOSIS — M1711 Unilateral primary osteoarthritis, right knee: Secondary | ICD-10-CM | POA: Diagnosis not present

## 2020-06-21 ENCOUNTER — Emergency Department (HOSPITAL_COMMUNITY): Payer: Medicare HMO

## 2020-06-21 ENCOUNTER — Emergency Department (HOSPITAL_COMMUNITY)
Admission: EM | Admit: 2020-06-21 | Discharge: 2020-06-21 | Disposition: A | Discharge status: Home / Self Care | Payer: Medicare HMO | Attending: Emergency Medicine | Admitting: Emergency Medicine

## 2020-06-21 ENCOUNTER — Emergency Department (HOSPITAL_BASED_OUTPATIENT_CLINIC_OR_DEPARTMENT_OTHER): Payer: Medicare HMO

## 2020-06-21 ENCOUNTER — Other Ambulatory Visit: Payer: Self-pay

## 2020-06-21 DIAGNOSIS — M7989 Other specified soft tissue disorders: Secondary | ICD-10-CM

## 2020-06-21 DIAGNOSIS — I509 Heart failure, unspecified: Secondary | ICD-10-CM | POA: Insufficient documentation

## 2020-06-21 DIAGNOSIS — N185 Chronic kidney disease, stage 5: Secondary | ICD-10-CM | POA: Insufficient documentation

## 2020-06-21 DIAGNOSIS — Z940 Kidney transplant status: Secondary | ICD-10-CM | POA: Diagnosis not present

## 2020-06-21 DIAGNOSIS — R0781 Pleurodynia: Secondary | ICD-10-CM | POA: Insufficient documentation

## 2020-06-21 DIAGNOSIS — N289 Disorder of kidney and ureter, unspecified: Secondary | ICD-10-CM | POA: Insufficient documentation

## 2020-06-21 DIAGNOSIS — Z20822 Contact with and (suspected) exposure to covid-19: Secondary | ICD-10-CM | POA: Diagnosis not present

## 2020-06-21 DIAGNOSIS — M79662 Pain in left lower leg: Secondary | ICD-10-CM | POA: Diagnosis not present

## 2020-06-21 DIAGNOSIS — E039 Hypothyroidism, unspecified: Secondary | ICD-10-CM | POA: Diagnosis not present

## 2020-06-21 DIAGNOSIS — R0789 Other chest pain: Secondary | ICD-10-CM | POA: Diagnosis not present

## 2020-06-21 DIAGNOSIS — R609 Edema, unspecified: Secondary | ICD-10-CM

## 2020-06-21 DIAGNOSIS — R079 Chest pain, unspecified: Secondary | ICD-10-CM | POA: Diagnosis not present

## 2020-06-21 DIAGNOSIS — D696 Thrombocytopenia, unspecified: Secondary | ICD-10-CM | POA: Diagnosis not present

## 2020-06-21 DIAGNOSIS — I132 Hypertensive heart and chronic kidney disease with heart failure and with stage 5 chronic kidney disease, or end stage renal disease: Secondary | ICD-10-CM | POA: Insufficient documentation

## 2020-06-21 DIAGNOSIS — R0602 Shortness of breath: Secondary | ICD-10-CM | POA: Diagnosis not present

## 2020-06-21 DIAGNOSIS — R069 Unspecified abnormalities of breathing: Secondary | ICD-10-CM | POA: Diagnosis not present

## 2020-06-21 DIAGNOSIS — Z79899 Other long term (current) drug therapy: Secondary | ICD-10-CM | POA: Insufficient documentation

## 2020-06-21 DIAGNOSIS — Z7901 Long term (current) use of anticoagulants: Secondary | ICD-10-CM | POA: Diagnosis not present

## 2020-06-21 DIAGNOSIS — I499 Cardiac arrhythmia, unspecified: Secondary | ICD-10-CM | POA: Diagnosis not present

## 2020-06-21 LAB — CBC WITH DIFFERENTIAL/PLATELET
Abs Immature Granulocytes: 0.07 10*3/uL (ref 0.00–0.07)
Basophils Absolute: 0.1 10*3/uL (ref 0.0–0.1)
Basophils Relative: 1 %
Eosinophils Absolute: 0.1 10*3/uL (ref 0.0–0.5)
Eosinophils Relative: 1 %
HCT: 41.6 % (ref 36.0–46.0)
Hemoglobin: 13.2 g/dL (ref 12.0–15.0)
Immature Granulocytes: 1 %
Lymphocytes Relative: 17 %
Lymphs Abs: 1.9 10*3/uL (ref 0.7–4.0)
MCH: 26.7 pg (ref 26.0–34.0)
MCHC: 31.7 g/dL (ref 30.0–36.0)
MCV: 84.2 fL (ref 80.0–100.0)
Monocytes Absolute: 0.8 10*3/uL (ref 0.1–1.0)
Monocytes Relative: 7 %
Neutro Abs: 8.3 10*3/uL — ABNORMAL HIGH (ref 1.7–7.7)
Neutrophils Relative %: 73 %
Platelets: 148 10*3/uL — ABNORMAL LOW (ref 150–400)
RBC: 4.94 MIL/uL (ref 3.87–5.11)
RDW: 14.6 % (ref 11.5–15.5)
WBC: 11.2 10*3/uL — ABNORMAL HIGH (ref 4.0–10.5)
nRBC: 0 % (ref 0.0–0.2)

## 2020-06-21 LAB — BASIC METABOLIC PANEL
Anion gap: 11 (ref 5–15)
BUN: 40 mg/dL — ABNORMAL HIGH (ref 8–23)
CO2: 19 mmol/L — ABNORMAL LOW (ref 22–32)
Calcium: 8.7 mg/dL — ABNORMAL LOW (ref 8.9–10.3)
Chloride: 107 mmol/L (ref 98–111)
Creatinine, Ser: 2.36 mg/dL — ABNORMAL HIGH (ref 0.44–1.00)
GFR, Estimated: 21 mL/min — ABNORMAL LOW (ref >60)
Glucose, Bld: 114 mg/dL — ABNORMAL HIGH (ref 70–99)
Potassium: 4.2 mmol/L (ref 3.5–5.1)
Sodium: 137 mmol/L (ref 135–145)

## 2020-06-21 LAB — RESPIRATORY PANEL BY RT PCR (FLU A&B, COVID)
Influenza A by PCR: NEGATIVE
Influenza B by PCR: NEGATIVE
SARS Coronavirus 2 by RT PCR: NEGATIVE

## 2020-06-21 LAB — TROPONIN I (HIGH SENSITIVITY)
Troponin I (High Sensitivity): 3 ng/L (ref <18)
Troponin I (High Sensitivity): 4 ng/L (ref <18)

## 2020-06-21 LAB — D-DIMER, QUANTITATIVE (NOT AT ARMC): D-Dimer, Quant: 2.24 ug/mL-FEU — ABNORMAL HIGH (ref 0.00–0.50)

## 2020-06-21 MED ORDER — TECHNETIUM TO 99M ALBUMIN AGGREGATED
4.2000 | Freq: Once | INTRAVENOUS | Status: AC | PRN
  Administered 2020-06-21: 4.2 via INTRAVENOUS

## 2020-06-21 MED ORDER — MORPHINE SULFATE (PF) 4 MG/ML IV SOLN
4.0000 mg | Freq: Once | INTRAVENOUS | Status: AC
  Administered 2020-06-21: 4 mg via INTRAVENOUS
  Filled 2020-06-21: qty 1

## 2020-06-21 MED ORDER — ENOXAPARIN SODIUM 100 MG/ML ~~LOC~~ SOLN
1.0000 mg/kg | Freq: Once | SUBCUTANEOUS | Status: AC
  Administered 2020-06-21: 95 mg via SUBCUTANEOUS
  Filled 2020-06-21: qty 0.95

## 2020-06-21 NOTE — ED Triage Notes (Signed)
Pt arrives to ED BIB [facility] presenting with CP and SHOB. Per EMS pt was feeling weak and experiencing cramps on her Lf leg. Per EMS pt had worsening cramp that radiated towards the Rt side of her chest. Per EMS began feeling SHOB and nauseous. 4mg  of zofran administered by EMS. Hx of DVT, PE and HTN. Currently not on blood thinners.   BP 146/98 HR 64 98% 2L [place]  CBG 127

## 2020-06-21 NOTE — ED Notes (Signed)
Pt given discharge instructions. Esignature pad not available. Pt agreeable to d/c with son

## 2020-06-21 NOTE — ED Provider Notes (Addendum)
64 year old lady presents to ER with concern for left calf pain, chest pain.  History of CKD, DVT/PE no longer on anticoagulation.  CTA chest not able to be obtained due to her kidney disease.  VQ scan has been ordered as well as lower extremity duplex.  7:10 AM  Received signout from Dr. Roxanne Mins, plan to follow-up V/Q and DVT study.  If negative likely discharge home.  12:00 PM perfusion scan with chronic defects, per my discussion with rad, this was unchanged from outside scans performed in Jan 2021 and in 2019, no evidence for recurrent PE; her DVT study negative. Patient has no ongoing symptoms and has normal vitals. Will dc home with plan for close PCP f/u. Reviewed return precautions.    Lucrezia Starch, MD 06/21/20 1700    Lucrezia Starch, MD 06/21/20 1201

## 2020-06-21 NOTE — ED Provider Notes (Signed)
[facility] EMERGENCY DEPARTMENT Provider Note   CSN: [id number] Arrival date & time: 06/21/20  7026   History Chief Complaint  Patient presents with  . Chest Pain  . Shortness of Breath    Rachael [name] is a 64 y.o. female.  The history is provided by the patient.  She has history of hypertension, chronic kidney disease currently on transplant list, DVT with pulmonary embolism but no longer anticoagulated comes in because of pain in her left calf and chest pain and dyspnea which started this evening.  Chest pain is sharp and was rated at 10/10 initially, has decreased to 8/10.  Pain is on the right side and is worse with a deep breath.  This is similar to how she felt with prior pulmonary embolism.  She is a non-smoker.  She has been vaccinated against COVID-19.  Past Medical History:  Diagnosis Date  . CHF (congestive heart failure) ([place])    Heart cath in 2006  . CKD (chronic kidney disease)    Baseline creat 1.4-1.7  . CVA (cerebral vascular accident) ([place])    No residual deficit  . Dyspnea   . HTN (hypertension)   . Hypothyroidism     Patient Active Problem List   Diagnosis Date Noted  . Community acquired pneumonia   . SOB (shortness of breath)   . Supratherapeutic INR 01/25/2017  . Orthostasis 01/23/2017  .  RV dilation by echocardiogram in setting of PE 01/23/2017  . Hypotension 01/21/2017  . Acute respiratory failure with hypoxia ([place])   . Pulmonary embolus ([place])   . Acute deep vein thrombosis (DVT) of both lower extremities (H[facility])   . Sepsis ([place]) 01/16/2017  . CKD (chronic kidney disease), stage IV ([place]) 01/16/2017  . CHF exacerbation ([place]) 01/16/2017  . Elevated troponin 01/16/2017  . Paroxysmal atrial fibrillation ([place]) 10/30/2016  . Interstitial nephritis   . Hemodialysis catheter infection ([place])   . History of CVA (cerebrovascular accident) 10/24/2016  . Depression 10/24/2016  . Hyperlipidemia 10/24/2016  . Bacteremia 10/23/2016  .  Chronic kidney disease (CKD), stage V ([place]) 10/23/2016  . Acute renal failure superimposed on stage 5 chronic kidney disease, not on chronic dialysis ([place]) 09/22/2016  . UTI (urinary tract infection) 09/22/2016  . Nausea and vomiting 09/22/2016  . HTN (hypertension) 09/22/2016  . Hypothyroidism 09/22/2016  . AKI (acute kidney injury) ([place]) 09/22/2016  . Pruritus 05/16/2015  . Urticaria 05/16/2015    Past Surgical History:  Procedure Laterality Date  . CARDIAC CATHETERIZATION  2006  . CHOLECYSTECTOMY    . IR GENERIC HISTORICAL  09/29/2016   IR FLUORO GUIDE CV LINE RIGHT 09/29/2016 Jacqulynn Cadet, MD [facility]-INTERV RAD  . IR GENERIC HISTORICAL  09/29/2016   IR US GUIDE VASC ACCESS RIGHT [facility]  . TEE WITHOUT CARDIOVERSION N/A 10/27/2016   Procedure: TRANSESOPHAGEAL ECHOCARDIOGRAM (TEE);  Surgeon: Dorothy Spark, MD;  Location: [facility] ENDOSCOPY;  Service: Cardiovascular;  Laterality: N/A;     OB History   No obstetric history on file.     Family History  Problem Relation Age of Onset  . Hypertension Mother   . Stroke Mother   . Coronary artery disease Mother        Late onset  . Hypertension Father   . Coronary artery disease Father        Late onset  . Hypertension Sister   . Hypertension Brother     Social History   Tobacco Use  . Smoking status: Never Smoker  .  Smokeless tobacco: Never Used  Vaping Use  . Vaping Use: Never used  Substance Use Topics  . Alcohol use: No  . Drug use: No    Home Medications Prior to Admission medications   Medication Sig Start Date End Date Taking? Authorizing Provider  citalopram (CELEXA) 20 MG tablet Take 1 tablet (20 mg total) by mouth daily. 12/04/16   Velvet Bathe, MD  famotidine (PEPCID) 20 MG tablet Take 1 tablet (20 mg total) by mouth 2 (two) times daily. Patient taking differently: Take 20 mg by mouth 2 (two) times daily as needed for heartburn.  10/14/16   Verlee Monte, MD  levothyroxine (SYNTHROID, LEVOTHROID) 88 MCG  tablet Take 1 tablet (88 mcg total) by mouth daily before breakfast. 10/31/16   Reyne Dumas, MD  metoCLOPramide (REGLAN) 5 MG tablet Take 1 tablet (5 mg total) by mouth 3 (three) times daily at 8am, 3pm and bedtime. Patient taking differently: Take 5 mg by mouth 3 (three) times daily as needed for nausea or vomiting.  12/04/16   Velvet Bathe, MD  predniSONE (DELTASONE) 10 MG tablet Take 1-2 tablets (10-20 mg total) by mouth daily with breakfast. Take 2 tablets (20mg ) daily x 12 days, then 1 tablet (10mg ) daily x 14 days then stop. 01/27/17   Eugenie Filler, MD  sodium bicarbonate 650 MG tablet Take 1 tablet (650 mg total) by mouth 2 (two) times daily. 01/26/17   Eugenie Filler, MD  warfarin (COUMADIN) 5 MG tablet Take 1 tablet (5 mg total) by mouth daily. 01/27/17   Eugenie Filler, MD    Allergies    Proton pump inhibitors, Protonix [pantoprazole], Penicillins, and Sulfa antibiotics  Review of Systems   Review of Systems  All other systems reviewed and are negative.   Physical Exam Updated Vital Signs There were no vitals taken for this visit.  Physical Exam Vitals and nursing note reviewed.   64 year old female, resting comfortably and in no acute distress. Vital signs are significant for elevated blood pressure. Oxygen saturation is 100%, which is normal. Head is normocephalic and atraumatic. PERRLA, EOMI. Oropharynx is clear. Neck is nontender and supple without adenopathy or JVD. Back is nontender and there is no CVA tenderness. Lungs are clear without rales, wheezes, or rhonchi. Chest is nontender. Heart has regular rate and rhythm without murmur. Abdomen is soft, flat, nontender without masses or hepatosplenomegaly and peristalsis is normoactive. Extremities: There is mild tenderness to palpation in the left calf.  Left calf circumference is 1 cm greater than right calf circumference.  [name]' sign is plus/minus on the left.  There is trace pretibial edema  bilaterally. Skin is warm and dry without rash. Neurologic: Mental status is normal, cranial nerves are intact, there are no motor or sensory deficits.  ED Results / Procedures / Treatments   Labs (all labs ordered are listed, but only abnormal results are displayed) Labs Reviewed  D-DIMER, QUANTITATIVE (NOT AT [facility]) - Abnormal; Notable for the following components:      Result Value   D-Dimer, Quant 2.24 (*)    All other components within normal limits  CBC WITH DIFFERENTIAL/PLATELET - Abnormal; Notable for the following components:   WBC 11.2 (*)    Platelets 148 (*)    Neutro Abs 8.3 (*)    All other components within normal limits  BASIC METABOLIC PANEL - Abnormal; Notable for the following components:   CO2 19 (*)    Glucose, Bld 114 (*)    BUN  40 (*)    Creatinine, Ser 2.36 (*)    Calcium 8.7 (*)    GFR, Estimated 21 (*)    All other components within normal limits  RESPIRATORY PANEL BY RT PCR (FLU A&B, COVID)  TROPONIN I (HIGH SENSITIVITY)    EKG EKG Interpretation  Date/Time:  Thursday June 21 2020 02:24:29 EDT Ventricular Rate:  55 PR Interval:    QRS Duration: 87 QT Interval:  470 QTC Calculation: 450 R Axis:   32 Text Interpretation: Sinus rhythm RSR' in V1 or V2, right VCD or RVH When compared with ECG of 01/16/2017, HEART RATE has decreased Confirmed by Delora Fuel ([id number]) on 06/21/2020 2:31:45 AM   Radiology DG Chest Port 1 View  Result Date: 06/21/2020 CLINICAL DATA:  Chest pain EXAM: PORTABLE CHEST 1 VIEW COMPARISON:  09/19/2019 FINDINGS: The heart size and mediastinal contours are within normal limits. Both lungs are clear. The visualized skeletal structures are unremarkable. IMPRESSION: No active disease. Electronically Signed   By: Ulyses Jarred M.D.   On: 06/21/2020 03:00    Procedures Procedures   Medications Ordered in ED Medications  enoxaparin (LOVENOX) injection 95 mg (has no administration in time range)  morphine 4 MG/ML injection 4  mg (4 mg Intravenous Given 06/21/20 0326)    ED Course  I have reviewed the triage vital signs and the nursing notes.  Pertinent labs & imaging results that were available during my care of the patient were reviewed by me and considered in my medical decision making (see chart for details).  MDM Rules/Calculators/A&P Calf pain and chest pain with dyspnea worrisome for recurrent pulmonary embolism.  However, there is no tachycardia and oxygen saturation is normal.  Old records are reviewed, and her baseline creatinine is over 2.  Therefore, she is not a candidate for CT angiogram.  Will screen with D-dimer and, if positive, will need to go for V/Q scan.  ECG shows no acute changes.  Chest x-ray has been ordered.  She will be given morphine for pain.  Chest x-ray shows no acute process.  D-dimer has come back elevated.  Creatinine is 2.36 which is at about her baseline.  CBC significant for mild thrombocytopenia which is improved over baseline.  She is given a dose of enoxaparin and V/Q scan is ordered.  Venous Doppler has been ordered of the left leg.  Case is signed out to Dr. Roslynn Amble.  Final Clinical Impression(s) / ED Diagnoses Final diagnoses:  Pleuritic chest pain  Pain of left calf  Renal insufficiency  Thrombocytopenia ([place])    Rx / DC Orders ED Discharge Orders    None       Delora Fuel, MD 81/44/81 [phone]

## 2020-06-21 NOTE — ED Notes (Addendum)
Pt resting in stretcher reporting chest pain has worsened back up to a 10, pt on cell phone resting with nadn, sinus rhythm at this time, provider to be made aware.

## 2020-06-21 NOTE — ED Notes (Signed)
[facility] notified this RN that they will be coming to get pt in 21m-1h

## 2020-06-21 NOTE — Progress Notes (Signed)
Left Lower Ext. study completed.   See CVProc for preliminary results.   Brentney Goldbach, RDMS, RVT 

## 2020-06-21 NOTE — Discharge Instructions (Addendum)
Please follow-up with your primary doctor regarding your symptoms today. If you have worsening chest pain, leg swelling, or other new concerning symptom, return to ER for reassessment.

## 2020-06-21 NOTE — ED Notes (Signed)
Pt transported to [facility]uc med  

## 2020-06-26 DIAGNOSIS — M1712 Unilateral primary osteoarthritis, left knee: Secondary | ICD-10-CM | POA: Diagnosis not present

## 2020-07-05 DIAGNOSIS — I129 Hypertensive chronic kidney disease with stage 1 through stage 4 chronic kidney disease, or unspecified chronic kidney disease: Secondary | ICD-10-CM | POA: Diagnosis not present

## 2020-07-05 DIAGNOSIS — R7309 Other abnormal glucose: Secondary | ICD-10-CM | POA: Diagnosis not present

## 2020-07-05 DIAGNOSIS — N2581 Secondary hyperparathyroidism of renal origin: Secondary | ICD-10-CM | POA: Diagnosis not present

## 2020-07-05 DIAGNOSIS — E039 Hypothyroidism, unspecified: Secondary | ICD-10-CM | POA: Diagnosis not present

## 2020-07-05 DIAGNOSIS — N184 Chronic kidney disease, stage 4 (severe): Secondary | ICD-10-CM | POA: Diagnosis not present

## 2020-07-05 DIAGNOSIS — D631 Anemia in chronic kidney disease: Secondary | ICD-10-CM | POA: Diagnosis not present

## 2020-07-05 DIAGNOSIS — N189 Chronic kidney disease, unspecified: Secondary | ICD-10-CM | POA: Diagnosis not present

## 2020-07-05 DIAGNOSIS — F32A Depression, unspecified: Secondary | ICD-10-CM | POA: Diagnosis not present

## 2020-07-05 DIAGNOSIS — E785 Hyperlipidemia, unspecified: Secondary | ICD-10-CM | POA: Diagnosis not present

## 2020-07-13 DIAGNOSIS — Z9181 History of falling: Secondary | ICD-10-CM | POA: Diagnosis not present

## 2020-07-13 DIAGNOSIS — E785 Hyperlipidemia, unspecified: Secondary | ICD-10-CM | POA: Diagnosis not present

## 2020-07-13 DIAGNOSIS — E039 Hypothyroidism, unspecified: Secondary | ICD-10-CM | POA: Diagnosis not present

## 2020-07-13 DIAGNOSIS — Z1331 Encounter for screening for depression: Secondary | ICD-10-CM | POA: Diagnosis not present

## 2020-07-13 DIAGNOSIS — Z0000 Encounter for general adult medical examination without abnormal findings: Secondary | ICD-10-CM | POA: Diagnosis not present

## 2020-07-13 DIAGNOSIS — Z6837 Body mass index (BMI) 37.0-37.9, adult: Secondary | ICD-10-CM | POA: Diagnosis not present

## 2020-07-16 DIAGNOSIS — Z23 Encounter for immunization: Secondary | ICD-10-CM | POA: Diagnosis not present

## 2020-08-09 DIAGNOSIS — Z01818 Encounter for other preprocedural examination: Secondary | ICD-10-CM | POA: Diagnosis not present

## 2020-08-09 DIAGNOSIS — N12 Tubulo-interstitial nephritis, not specified as acute or chronic: Secondary | ICD-10-CM | POA: Diagnosis not present

## 2020-08-09 DIAGNOSIS — E669 Obesity, unspecified: Secondary | ICD-10-CM | POA: Diagnosis not present

## 2020-08-09 DIAGNOSIS — Z8669 Personal history of other diseases of the nervous system and sense organs: Secondary | ICD-10-CM | POA: Diagnosis not present

## 2020-08-09 DIAGNOSIS — N184 Chronic kidney disease, stage 4 (severe): Secondary | ICD-10-CM | POA: Diagnosis not present

## 2020-08-09 DIAGNOSIS — R911 Solitary pulmonary nodule: Secondary | ICD-10-CM | POA: Diagnosis not present

## 2020-08-09 DIAGNOSIS — Z6837 Body mass index (BMI) 37.0-37.9, adult: Secondary | ICD-10-CM | POA: Diagnosis not present

## 2020-08-09 DIAGNOSIS — I700 Atherosclerosis of aorta: Secondary | ICD-10-CM | POA: Diagnosis not present

## 2020-08-09 DIAGNOSIS — Z23 Encounter for immunization: Secondary | ICD-10-CM | POA: Diagnosis not present

## 2020-08-09 DIAGNOSIS — T471X5A Adverse effect of other antacids and anti-gastric-secretion drugs, initial encounter: Secondary | ICD-10-CM | POA: Diagnosis not present

## 2020-08-28 DIAGNOSIS — Z6838 Body mass index (BMI) 38.0-38.9, adult: Secondary | ICD-10-CM | POA: Diagnosis not present

## 2020-08-28 DIAGNOSIS — R0789 Other chest pain: Secondary | ICD-10-CM | POA: Diagnosis not present

## 2020-08-28 DIAGNOSIS — Z20828 Contact with and (suspected) exposure to other viral communicable diseases: Secondary | ICD-10-CM | POA: Diagnosis not present

## 2020-08-28 DIAGNOSIS — J209 Acute bronchitis, unspecified: Secondary | ICD-10-CM | POA: Diagnosis not present

## 2020-09-05 DIAGNOSIS — Z20828 Contact with and (suspected) exposure to other viral communicable diseases: Secondary | ICD-10-CM | POA: Diagnosis not present

## 2020-09-15 DIAGNOSIS — U071 COVID-19: Secondary | ICD-10-CM | POA: Diagnosis not present

## 2020-09-15 DIAGNOSIS — I313 Pericardial effusion (noninflammatory): Secondary | ICD-10-CM | POA: Diagnosis not present

## 2020-09-15 DIAGNOSIS — R0602 Shortness of breath: Secondary | ICD-10-CM | POA: Diagnosis not present

## 2020-09-15 DIAGNOSIS — I361 Nonrheumatic tricuspid (valve) insufficiency: Secondary | ICD-10-CM | POA: Diagnosis not present

## 2020-09-15 DIAGNOSIS — I110 Hypertensive heart disease with heart failure: Secondary | ICD-10-CM | POA: Diagnosis not present

## 2020-09-15 DIAGNOSIS — I2699 Other pulmonary embolism without acute cor pulmonale: Secondary | ICD-10-CM | POA: Diagnosis not present

## 2020-09-15 DIAGNOSIS — I82412 Acute embolism and thrombosis of left femoral vein: Secondary | ICD-10-CM | POA: Diagnosis not present

## 2020-09-15 DIAGNOSIS — R110 Nausea: Secondary | ICD-10-CM | POA: Diagnosis not present

## 2020-09-15 DIAGNOSIS — R531 Weakness: Secondary | ICD-10-CM | POA: Diagnosis not present

## 2020-09-15 DIAGNOSIS — I129 Hypertensive chronic kidney disease with stage 1 through stage 4 chronic kidney disease, or unspecified chronic kidney disease: Secondary | ICD-10-CM | POA: Diagnosis not present

## 2020-09-15 DIAGNOSIS — R1110 Vomiting, unspecified: Secondary | ICD-10-CM | POA: Diagnosis not present

## 2020-09-15 DIAGNOSIS — Q249 Congenital malformation of heart, unspecified: Secondary | ICD-10-CM | POA: Diagnosis not present

## 2020-09-15 DIAGNOSIS — R42 Dizziness and giddiness: Secondary | ICD-10-CM | POA: Diagnosis not present

## 2020-09-15 DIAGNOSIS — I82432 Acute embolism and thrombosis of left popliteal vein: Secondary | ICD-10-CM | POA: Diagnosis not present

## 2020-09-15 DIAGNOSIS — I10 Essential (primary) hypertension: Secondary | ICD-10-CM | POA: Diagnosis not present

## 2020-09-15 DIAGNOSIS — I82409 Acute embolism and thrombosis of unspecified deep veins of unspecified lower extremity: Secondary | ICD-10-CM | POA: Diagnosis not present

## 2020-09-15 DIAGNOSIS — I509 Heart failure, unspecified: Secondary | ICD-10-CM | POA: Diagnosis not present

## 2020-09-15 DIAGNOSIS — R112 Nausea with vomiting, unspecified: Secondary | ICD-10-CM | POA: Diagnosis not present

## 2020-09-17 DIAGNOSIS — I2699 Other pulmonary embolism without acute cor pulmonale: Secondary | ICD-10-CM | POA: Diagnosis not present

## 2020-09-17 DIAGNOSIS — U071 COVID-19: Secondary | ICD-10-CM | POA: Diagnosis not present

## 2020-09-17 DIAGNOSIS — F329 Major depressive disorder, single episode, unspecified: Secondary | ICD-10-CM | POA: Diagnosis not present

## 2020-09-17 DIAGNOSIS — N19 Unspecified kidney failure: Secondary | ICD-10-CM | POA: Diagnosis not present

## 2020-09-17 DIAGNOSIS — I120 Hypertensive chronic kidney disease with stage 5 chronic kidney disease or end stage renal disease: Secondary | ICD-10-CM | POA: Diagnosis not present

## 2020-09-17 DIAGNOSIS — N179 Acute kidney failure, unspecified: Secondary | ICD-10-CM | POA: Diagnosis not present

## 2020-09-17 DIAGNOSIS — K7290 Hepatic failure, unspecified without coma: Secondary | ICD-10-CM | POA: Diagnosis not present

## 2020-09-17 DIAGNOSIS — I313 Pericardial effusion (noninflammatory): Secondary | ICD-10-CM | POA: Diagnosis not present

## 2020-09-17 DIAGNOSIS — R0602 Shortness of breath: Secondary | ICD-10-CM | POA: Diagnosis not present

## 2020-09-17 DIAGNOSIS — I82409 Acute embolism and thrombosis of unspecified deep veins of unspecified lower extremity: Secondary | ICD-10-CM | POA: Diagnosis not present

## 2020-09-17 DIAGNOSIS — Q249 Congenital malformation of heart, unspecified: Secondary | ICD-10-CM | POA: Diagnosis not present

## 2020-09-17 DIAGNOSIS — I129 Hypertensive chronic kidney disease with stage 1 through stage 4 chronic kidney disease, or unspecified chronic kidney disease: Secondary | ICD-10-CM | POA: Diagnosis not present

## 2020-09-17 DIAGNOSIS — N185 Chronic kidney disease, stage 5: Secondary | ICD-10-CM | POA: Diagnosis not present

## 2020-09-17 DIAGNOSIS — N189 Chronic kidney disease, unspecified: Secondary | ICD-10-CM | POA: Diagnosis not present

## 2020-09-17 DIAGNOSIS — I82412 Acute embolism and thrombosis of left femoral vein: Secondary | ICD-10-CM | POA: Diagnosis not present

## 2020-09-17 DIAGNOSIS — I361 Nonrheumatic tricuspid (valve) insufficiency: Secondary | ICD-10-CM | POA: Diagnosis not present

## 2020-09-17 DIAGNOSIS — N261 Atrophy of kidney (terminal): Secondary | ICD-10-CM | POA: Diagnosis not present

## 2020-09-17 DIAGNOSIS — R059 Cough, unspecified: Secondary | ICD-10-CM | POA: Diagnosis not present

## 2020-09-17 DIAGNOSIS — I2692 Saddle embolus of pulmonary artery without acute cor pulmonale: Secondary | ICD-10-CM | POA: Diagnosis not present

## 2020-09-17 DIAGNOSIS — I10 Essential (primary) hypertension: Secondary | ICD-10-CM | POA: Diagnosis not present

## 2020-09-17 DIAGNOSIS — K449 Diaphragmatic hernia without obstruction or gangrene: Secondary | ICD-10-CM | POA: Diagnosis not present

## 2020-09-17 DIAGNOSIS — I82432 Acute embolism and thrombosis of left popliteal vein: Secondary | ICD-10-CM | POA: Diagnosis not present

## 2020-09-18 DIAGNOSIS — U071 COVID-19: Secondary | ICD-10-CM | POA: Diagnosis not present

## 2020-09-18 DIAGNOSIS — I2692 Saddle embolus of pulmonary artery without acute cor pulmonale: Secondary | ICD-10-CM | POA: Diagnosis not present

## 2020-09-18 DIAGNOSIS — R0602 Shortness of breath: Secondary | ICD-10-CM | POA: Diagnosis not present

## 2020-09-18 DIAGNOSIS — N179 Acute kidney failure, unspecified: Secondary | ICD-10-CM | POA: Diagnosis not present

## 2020-09-18 DIAGNOSIS — R059 Cough, unspecified: Secondary | ICD-10-CM | POA: Diagnosis not present

## 2020-09-19 DIAGNOSIS — N19 Unspecified kidney failure: Secondary | ICD-10-CM | POA: Diagnosis not present

## 2020-09-19 DIAGNOSIS — K449 Diaphragmatic hernia without obstruction or gangrene: Secondary | ICD-10-CM | POA: Diagnosis not present

## 2020-09-19 DIAGNOSIS — I2692 Saddle embolus of pulmonary artery without acute cor pulmonale: Secondary | ICD-10-CM | POA: Diagnosis not present

## 2020-09-19 DIAGNOSIS — K7290 Hepatic failure, unspecified without coma: Secondary | ICD-10-CM | POA: Diagnosis not present

## 2020-09-19 DIAGNOSIS — U071 COVID-19: Secondary | ICD-10-CM | POA: Diagnosis not present

## 2020-09-19 DIAGNOSIS — N179 Acute kidney failure, unspecified: Secondary | ICD-10-CM | POA: Diagnosis not present

## 2020-09-19 DIAGNOSIS — N261 Atrophy of kidney (terminal): Secondary | ICD-10-CM | POA: Diagnosis not present

## 2020-09-20 DIAGNOSIS — I313 Pericardial effusion (noninflammatory): Secondary | ICD-10-CM | POA: Diagnosis not present

## 2020-09-20 DIAGNOSIS — N179 Acute kidney failure, unspecified: Secondary | ICD-10-CM | POA: Diagnosis not present

## 2020-09-20 DIAGNOSIS — I129 Hypertensive chronic kidney disease with stage 1 through stage 4 chronic kidney disease, or unspecified chronic kidney disease: Secondary | ICD-10-CM | POA: Diagnosis not present

## 2020-09-20 DIAGNOSIS — Q249 Congenital malformation of heart, unspecified: Secondary | ICD-10-CM | POA: Diagnosis not present

## 2020-09-20 DIAGNOSIS — I82409 Acute embolism and thrombosis of unspecified deep veins of unspecified lower extremity: Secondary | ICD-10-CM | POA: Diagnosis not present

## 2020-09-20 DIAGNOSIS — I2692 Saddle embolus of pulmonary artery without acute cor pulmonale: Secondary | ICD-10-CM | POA: Diagnosis not present

## 2020-09-20 DIAGNOSIS — U071 COVID-19: Secondary | ICD-10-CM | POA: Diagnosis not present

## 2020-09-20 DIAGNOSIS — I2699 Other pulmonary embolism without acute cor pulmonale: Secondary | ICD-10-CM | POA: Diagnosis not present

## 2020-09-20 DIAGNOSIS — I361 Nonrheumatic tricuspid (valve) insufficiency: Secondary | ICD-10-CM | POA: Diagnosis not present

## 2020-09-21 DIAGNOSIS — I129 Hypertensive chronic kidney disease with stage 1 through stage 4 chronic kidney disease, or unspecified chronic kidney disease: Secondary | ICD-10-CM | POA: Diagnosis not present

## 2020-09-21 DIAGNOSIS — I313 Pericardial effusion (noninflammatory): Secondary | ICD-10-CM | POA: Diagnosis not present

## 2020-09-21 DIAGNOSIS — Q249 Congenital malformation of heart, unspecified: Secondary | ICD-10-CM | POA: Diagnosis not present

## 2020-09-21 DIAGNOSIS — U071 COVID-19: Secondary | ICD-10-CM | POA: Diagnosis not present

## 2020-09-21 DIAGNOSIS — I361 Nonrheumatic tricuspid (valve) insufficiency: Secondary | ICD-10-CM | POA: Diagnosis not present

## 2020-09-21 DIAGNOSIS — I2699 Other pulmonary embolism without acute cor pulmonale: Secondary | ICD-10-CM | POA: Diagnosis not present

## 2020-09-21 DIAGNOSIS — N179 Acute kidney failure, unspecified: Secondary | ICD-10-CM | POA: Diagnosis not present

## 2020-09-21 DIAGNOSIS — I82409 Acute embolism and thrombosis of unspecified deep veins of unspecified lower extremity: Secondary | ICD-10-CM | POA: Diagnosis not present

## 2020-09-21 DIAGNOSIS — I2692 Saddle embolus of pulmonary artery without acute cor pulmonale: Secondary | ICD-10-CM | POA: Diagnosis not present

## 2020-09-22 DIAGNOSIS — U071 COVID-19: Secondary | ICD-10-CM | POA: Diagnosis not present

## 2020-09-22 DIAGNOSIS — N179 Acute kidney failure, unspecified: Secondary | ICD-10-CM | POA: Diagnosis not present

## 2020-09-22 DIAGNOSIS — I2692 Saddle embolus of pulmonary artery without acute cor pulmonale: Secondary | ICD-10-CM | POA: Diagnosis not present

## 2020-09-23 DIAGNOSIS — I361 Nonrheumatic tricuspid (valve) insufficiency: Secondary | ICD-10-CM | POA: Diagnosis not present

## 2020-09-23 DIAGNOSIS — U071 COVID-19: Secondary | ICD-10-CM | POA: Diagnosis not present

## 2020-09-23 DIAGNOSIS — N179 Acute kidney failure, unspecified: Secondary | ICD-10-CM | POA: Diagnosis not present

## 2020-09-23 DIAGNOSIS — I2692 Saddle embolus of pulmonary artery without acute cor pulmonale: Secondary | ICD-10-CM | POA: Diagnosis not present

## 2020-09-24 DIAGNOSIS — I2692 Saddle embolus of pulmonary artery without acute cor pulmonale: Secondary | ICD-10-CM | POA: Diagnosis not present

## 2020-09-24 DIAGNOSIS — N179 Acute kidney failure, unspecified: Secondary | ICD-10-CM | POA: Diagnosis not present

## 2020-09-24 DIAGNOSIS — U071 COVID-19: Secondary | ICD-10-CM | POA: Diagnosis not present

## 2020-10-10 DIAGNOSIS — Z9981 Dependence on supplemental oxygen: Secondary | ICD-10-CM | POA: Diagnosis not present

## 2020-10-10 DIAGNOSIS — I2692 Saddle embolus of pulmonary artery without acute cor pulmonale: Secondary | ICD-10-CM | POA: Diagnosis not present

## 2020-10-10 DIAGNOSIS — U071 COVID-19: Secondary | ICD-10-CM | POA: Diagnosis not present

## 2020-10-10 DIAGNOSIS — Y708 Miscellaneous anesthesiology devices associated with adverse incidents, not elsewhere classified: Secondary | ICD-10-CM | POA: Diagnosis not present

## 2020-10-10 DIAGNOSIS — I824Z2 Acute embolism and thrombosis of unspecified deep veins of left distal lower extremity: Secondary | ICD-10-CM | POA: Diagnosis not present

## 2020-10-10 DIAGNOSIS — N1832 Chronic kidney disease, stage 3b: Secondary | ICD-10-CM | POA: Diagnosis not present

## 2020-10-10 DIAGNOSIS — J9611 Chronic respiratory failure with hypoxia: Secondary | ICD-10-CM | POA: Diagnosis not present

## 2020-12-12 DIAGNOSIS — I2692 Saddle embolus of pulmonary artery without acute cor pulmonale: Secondary | ICD-10-CM | POA: Diagnosis not present

## 2020-12-12 DIAGNOSIS — Z6836 Body mass index (BMI) 36.0-36.9, adult: Secondary | ICD-10-CM | POA: Diagnosis not present

## 2020-12-27 DIAGNOSIS — Z1231 Encounter for screening mammogram for malignant neoplasm of breast: Secondary | ICD-10-CM | POA: Diagnosis not present

## 2021-01-10 DIAGNOSIS — M25562 Pain in left knee: Secondary | ICD-10-CM | POA: Diagnosis not present

## 2021-01-10 DIAGNOSIS — R059 Cough, unspecified: Secondary | ICD-10-CM | POA: Diagnosis not present

## 2021-01-10 DIAGNOSIS — R0982 Postnasal drip: Secondary | ICD-10-CM | POA: Diagnosis not present

## 2021-01-10 DIAGNOSIS — I2692 Saddle embolus of pulmonary artery without acute cor pulmonale: Secondary | ICD-10-CM | POA: Diagnosis not present

## 2021-01-10 DIAGNOSIS — N184 Chronic kidney disease, stage 4 (severe): Secondary | ICD-10-CM | POA: Diagnosis not present

## 2021-01-10 DIAGNOSIS — R0789 Other chest pain: Secondary | ICD-10-CM | POA: Diagnosis not present

## 2021-01-10 DIAGNOSIS — R0600 Dyspnea, unspecified: Secondary | ICD-10-CM | POA: Diagnosis not present

## 2021-01-10 DIAGNOSIS — S8002XA Contusion of left knee, initial encounter: Secondary | ICD-10-CM | POA: Diagnosis not present

## 2021-01-10 DIAGNOSIS — J45909 Unspecified asthma, uncomplicated: Secondary | ICD-10-CM | POA: Diagnosis not present

## 2021-01-30 DIAGNOSIS — S81801A Unspecified open wound, right lower leg, initial encounter: Secondary | ICD-10-CM | POA: Diagnosis not present

## 2021-01-30 DIAGNOSIS — W5503XA Scratched by cat, initial encounter: Secondary | ICD-10-CM | POA: Diagnosis not present

## 2021-02-28 DIAGNOSIS — R509 Fever, unspecified: Secondary | ICD-10-CM | POA: Diagnosis not present

## 2021-02-28 DIAGNOSIS — Z20828 Contact with and (suspected) exposure to other viral communicable diseases: Secondary | ICD-10-CM | POA: Diagnosis not present

## 2021-02-28 DIAGNOSIS — J40 Bronchitis, not specified as acute or chronic: Secondary | ICD-10-CM | POA: Diagnosis not present

## 2021-04-02 DIAGNOSIS — M1711 Unilateral primary osteoarthritis, right knee: Secondary | ICD-10-CM | POA: Diagnosis not present

## 2021-04-02 DIAGNOSIS — M1712 Unilateral primary osteoarthritis, left knee: Secondary | ICD-10-CM | POA: Diagnosis not present

## 2021-07-07 DIAGNOSIS — E785 Hyperlipidemia, unspecified: Secondary | ICD-10-CM | POA: Diagnosis not present

## 2021-07-07 DIAGNOSIS — I2699 Other pulmonary embolism without acute cor pulmonale: Secondary | ICD-10-CM | POA: Diagnosis not present

## 2021-07-07 DIAGNOSIS — M7989 Other specified soft tissue disorders: Secondary | ICD-10-CM | POA: Diagnosis not present

## 2021-07-07 DIAGNOSIS — I509 Heart failure, unspecified: Secondary | ICD-10-CM | POA: Diagnosis not present

## 2021-07-07 DIAGNOSIS — R079 Chest pain, unspecified: Secondary | ICD-10-CM | POA: Diagnosis not present

## 2021-07-07 DIAGNOSIS — I130 Hypertensive heart and chronic kidney disease with heart failure and stage 1 through stage 4 chronic kidney disease, or unspecified chronic kidney disease: Secondary | ICD-10-CM | POA: Diagnosis not present

## 2021-07-07 DIAGNOSIS — M79604 Pain in right leg: Secondary | ICD-10-CM | POA: Diagnosis not present

## 2021-07-07 DIAGNOSIS — I361 Nonrheumatic tricuspid (valve) insufficiency: Secondary | ICD-10-CM | POA: Diagnosis not present

## 2021-07-07 DIAGNOSIS — M79605 Pain in left leg: Secondary | ICD-10-CM | POA: Diagnosis not present

## 2021-07-07 DIAGNOSIS — Z86718 Personal history of other venous thrombosis and embolism: Secondary | ICD-10-CM | POA: Diagnosis not present

## 2021-07-07 DIAGNOSIS — D6851 Activated protein C resistance: Secondary | ICD-10-CM | POA: Diagnosis not present

## 2021-07-07 DIAGNOSIS — R0789 Other chest pain: Secondary | ICD-10-CM | POA: Diagnosis not present

## 2021-07-07 DIAGNOSIS — I4891 Unspecified atrial fibrillation: Secondary | ICD-10-CM | POA: Diagnosis not present

## 2021-07-07 DIAGNOSIS — N184 Chronic kidney disease, stage 4 (severe): Secondary | ICD-10-CM | POA: Diagnosis not present

## 2021-07-07 DIAGNOSIS — Z6835 Body mass index (BMI) 35.0-35.9, adult: Secondary | ICD-10-CM | POA: Diagnosis not present

## 2021-07-07 DIAGNOSIS — R072 Precordial pain: Secondary | ICD-10-CM | POA: Diagnosis not present

## 2021-07-11 ENCOUNTER — Other Ambulatory Visit: Payer: Self-pay

## 2021-07-11 NOTE — Patient Outreach (Signed)
[facility]) Care Management  07/11/2021  Rachael [name] 27-Dec-1955 [id number]     Transition of Care Referral  Referral Date: 07/11/2021 Referral Source: Discharge Report Date of Discharge:07/10/2021 Facility:[facility]   Referral received. No outreach warranted at this time. TOC will be completed by primary care provider office who will refer to [facility] care mgmt if needed.   Plan: RN CM will close referral.   Enzo Montgomery, RN,BSN,CCM [place] Management Telephonic Care Management Coordinator Direct Phone: [phone] Toll Free: [phone] Fax: [phone]

## 2021-07-12 DIAGNOSIS — T7840XA Allergy, unspecified, initial encounter: Secondary | ICD-10-CM | POA: Diagnosis not present

## 2021-07-12 DIAGNOSIS — L299 Pruritus, unspecified: Secondary | ICD-10-CM | POA: Diagnosis not present

## 2021-07-15 ENCOUNTER — Other Ambulatory Visit: Payer: Self-pay | Admitting: Oncology

## 2021-07-15 ENCOUNTER — Telehealth: Payer: Self-pay | Admitting: Oncology

## 2021-07-15 ENCOUNTER — Telehealth: Payer: Self-pay

## 2021-07-15 DIAGNOSIS — I2699 Other pulmonary embolism without acute cor pulmonale: Secondary | ICD-10-CM

## 2021-07-15 DIAGNOSIS — I824Y3 Acute embolism and thrombosis of unspecified deep veins of proximal lower extremity, bilateral: Secondary | ICD-10-CM

## 2021-07-15 MED ORDER — APIXABAN 5 MG PO TABS: 5.0000 mg | ORAL_TABLET | Freq: Two times a day (BID) | ORAL | 5 refills | Status: DC

## 2021-07-15 NOTE — Telephone Encounter (Addendum)
RE: Intense itching from Lovenox injections Received: Pattricia Boss, Chinita Pester, MD  Dairl Ponder, RN Called her and instructed to stop the Lovenox, push fluids, use Benadryl 50 mg q 4 hours prn and the prednisone.  She has failed coumadin and Xarelto, can't take Arixtra due to renal failure.  So will place her on Eliquis 5 mg bid.  Explained to her and will send in Rx         Intense itching from Lovenox injections Received: Today Dairl Ponder, RN  Derwood Kaplan, MD Pt called to report, "I am about to itch myself to death since I started the shot for blood thinning. I went to Urgent Care and they gave me prednisone and an antihistamine". She read the bottle to me.. Periactin 4mg  1-2 tabs q 8hr PRN. She has been taking the 2 tabs each time, and it isnt helping. The Lovenox is 74mcg.

## 2021-07-15 NOTE — Telephone Encounter (Signed)
Per Staff Message, patient see Inpatient - Scheduled 11/22 Labs 11:00 am [facility] Follow Up 11:30 am.  Informed patient to call Cancer at [facility] to speak to Triage Nurse due to going to Urgent Care for itching - Patient still having issues

## 2021-07-16 ENCOUNTER — Other Ambulatory Visit: Payer: Self-pay | Admitting: Oncology

## 2021-07-16 DIAGNOSIS — I2609 Other pulmonary embolism with acute cor pulmonale: Secondary | ICD-10-CM

## 2021-07-19 DIAGNOSIS — Z0000 Encounter for general adult medical examination without abnormal findings: Secondary | ICD-10-CM | POA: Diagnosis not present

## 2021-07-19 DIAGNOSIS — N1832 Chronic kidney disease, stage 3b: Secondary | ICD-10-CM | POA: Diagnosis not present

## 2021-07-19 DIAGNOSIS — Z1331 Encounter for screening for depression: Secondary | ICD-10-CM | POA: Diagnosis not present

## 2021-07-19 DIAGNOSIS — I2699 Other pulmonary embolism without acute cor pulmonale: Secondary | ICD-10-CM | POA: Diagnosis not present

## 2021-07-19 DIAGNOSIS — Z6836 Body mass index (BMI) 36.0-36.9, adult: Secondary | ICD-10-CM | POA: Diagnosis not present

## 2021-07-23 NOTE — Progress Notes (Addendum)
ADDENDUM:  Testing for lupus anticoagulant is positive.  This will be repeated later for confirmation and I will contact the patient.  She will need lifelong anticoagulation.  Other tests were normal, including Protein C, Protein S, Antithrombin III, beta 2 glycoprotein, anticardiolipin and factor V Leiden. Prothrombin gene mutation is pending.    [facility]  [address] [place],    [address] [phone]  Clinic Day:  07/30/2021  Referring physician: Ronita Hipps, MD  This document serves as a record of services personally performed by Hosie Poisson, MD. It was created on their behalf by Curry,Lauren E, a trained medical scribe. The creation of this record is based on the scribe's personal observations and the provider's statements to them.  ASSESSMENT & PLAN:   1. Multiple recurrences of DVT and PE, and now recurrent despite compliance with Xarelto, although at dose of 15 mg bid, adjusted for her renal function.  I think this represents a failure of Xarelto, but she is allergic to Lovenox and Arixtra could not be prescribed due to her renal insufficiency. She was therefore placed on Eliquis 5 mg BID. Factor V Leiden was negative, but I will order a full hypercoagulable workup today for completeness.   2. CKD, with prior dialysis, on waiting list for transplant. Her creatinine has improved now measuring 1.9.  3. Prior anemia, with normal B12 and folate.  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  4. Symptoms of possible UTI with increased frequency, urgency, nocturia, mild dysuria and back pain. I will order UA and culture for further evaluation.  In view of her multiple recurrences of DVT and PE, I will do a full hypercoagulable workup. Factor V Leiden was negative, but I will also test for Lupus anticoagulant, prothrombin gene mutation, antithrombin III, cardiolipin antibodies, beta-2  glycoprotein and protein C and protein S. For now, she will continue Eliquis 5 mg BID, and I will send in a 3 month supply to her mail in pharmacy along with refills. I will call her with the results, and we will plan to see her back in 4 months with CBC and CMP for repeat evaluation. In view of her urinary symptoms, I will obtain a UA and culture for further evaluation. She understands and agrees with this plan of care.  I provided 20 minutes of face-to-face time during this this encounter and > 50% was spent counseling as documented under my assessment and plan.    Derwood Kaplan, MD [facility] [address] [place] [address] Dept: [phone] Dept Fax: [phone]    CHIEF COMPLAINT:  CC: History of chronic DVT and PE  Current Treatment:  Eliquis 5 mg BID   HISTORY OF PRESENT ILLNESS:  Rachael [name] is a 65 y.o. female with a history of of DVT and atrial fibrillation who has been on Xarelto 15 mg bid and has been compliant.  She came to the hospital in October for worsening dyspnea and lower anterior chest pain associated with dizziness.  They were unable to perform a CTA due to CKD stage 5, and so a V/Q ventilation perfusion scan was obtained and revealed a large lobar perfusion defect of the right upper lobe, with a high probability pulmonary embolism.  She tells me her mother had blood clots and some aunts as well and she thinks they may have had Factor V  Leiden.  Factor V Leiden was obtained and negative. Bilateral lower extremity U/S was negative. Echocardiogram was normal with an EF between 60-65%.  She has a history of chronic DVT and PE, with episodes in 2018 and 2019, and has been on warfarin in the past. She reminds me that she had experienced itching with Lovenox in the past, but I asked her to try it again, since I think she has failed the Xarelto. However, she was unable to tolerate it. She was then placed  on Eliquis at 5 mg BID. She is currently awaiting a kidney transplant and has previously been on dialysis.    INTERVAL HISTORY:  I have reviewed her chart and materials related to her cancer extensively and collaborated history with the patient. Summary of oncologic history is as follows: Oncology History   No history exists.    Rachael [name] is here for follow up and states that she continues to have dry cough. She denies episodes of hemoptysis. She does report night sweats for the past couple weeks. She reports chronic leg and feet cramps. She feels she may be developing a UTI as she notes increased urinary frequency, nocturia, mild dysuria, urgency and back pain. She followed up with Dr. Helene Kelp after she was discharged from the hospital. Blood counts and chemistries are unremarkable except for a BUN of 30, improved and a creatinine of 1.9, previously 2.0 at discharge. Her  appetite is fair, and she denies any significant weight loss  She denies fever, chills or other signs of infection.  She denies nausea, vomiting, bowel issues, or abdominal pain.  She denies sore throat, dyspnea, or chest pain.  HISTORY:   Allergies:  Allergies  Allergen Reactions   Lovenox [Enoxaparin] Itching   Proton Pump Inhibitors     Biopsy proven acute interstitial nephritis; required dialysis in 09/2016   Protonix [Pantoprazole] Anaphylaxis   Penicillins Rash and Other (See Comments)    Tolerates rocephin   Sulfa Antibiotics Rash    Current Medications: Current Outpatient Medications  Medication Sig Dispense Refill   melatonin 5 MG TABS Take by mouth at bedtime as needed.     apixaban (ELIQUIS) 5 MG TABS tablet Take 1 tablet (5 mg total) by mouth 2 (two) times daily. 60 tablet 5   Cholecalciferol (VITAMIN D) 50 MCG (2000 UT) tablet Take 2,000 Units by mouth daily.     famotidine (PEPCID) 20 MG tablet Take 1 tablet (20 mg total) by mouth 2 (two) times daily. (Patient taking differently: Take 20 mg by mouth 2 (two) times  daily as needed for heartburn. ) 60 tablet 0   furosemide (LASIX) 40 MG tablet Take 40 mg by mouth as needed for fluid.      levothyroxine (SYNTHROID) 100 MCG tablet Take 100 mcg by mouth daily.     rosuvastatin (CRESTOR) 5 MG tablet Take 5 mg by mouth 3 (three) times a week.     No current facility-administered medications for this visit.    REVIEW OF SYSTEMS:  Review of Systems  Constitutional: Negative.  Negative for appetite change, chills, fatigue, fever and unexpected weight change.  HENT:  Negative.    Eyes: Negative.   Respiratory:  Positive for cough (dry). Negative for chest tightness, hemoptysis, shortness of breath and wheezing.   Cardiovascular: Negative.  Negative for chest pain, leg swelling and palpitations.  Gastrointestinal: Negative.  Negative for abdominal distention, abdominal pain, blood in stool, constipation, diarrhea, nausea and vomiting.  Endocrine: Positive for hot flashes (night sweats).  Genitourinary:  Positive for dysuria (mild), frequency (urgency) and nocturia. Negative for difficulty urinating and hematuria.   Musculoskeletal:  Positive for back pain. Negative for arthralgias, flank pain, gait problem and myalgias.       Cramps of the legs and feet, chronic  Skin: Negative.   Neurological: Negative.  Negative for dizziness, extremity weakness, gait problem, headaches, light-headedness, numbness, seizures and speech difficulty.  Hematological: Negative.   Psychiatric/Behavioral: Negative.  Negative for depression and sleep disturbance. The patient is not nervous/anxious.      VITALS:  Blood pressure 138/67, pulse 74, temperature 98 F (36.7 C), temperature source Oral, resp. rate 18, height 5\' 2"  (1.575 m), weight 203 lb 14.4 oz (92.5 kg), SpO2 96 %.  Wt Readings from Last 3 Encounters:  07/30/21 203 lb 14.4 oz (92.5 kg)  06/21/20 208 lb (94.3 kg)  01/26/17 220 lb 4.8 oz (99.9 kg)    Body mass index is 37.29 kg/m.  Performance status (ECOG): 1 -  Symptomatic but completely ambulatory  PHYSICAL EXAM:  Physical Exam Constitutional:      General: She is not in acute distress.    Appearance: Normal appearance. She is normal weight.  HENT:     Head: Normocephalic and atraumatic.  Eyes:     General: No scleral icterus.    Extraocular Movements: Extraocular movements intact.     Conjunctiva/sclera: Conjunctivae normal.     Pupils: Pupils are equal, round, and reactive to light.  Cardiovascular:     Rate and Rhythm: Normal rate and regular rhythm.     Pulses: Normal pulses.     Heart sounds: Normal heart sounds. No murmur heard.   No friction rub. No gallop.  Pulmonary:     Effort: Pulmonary effort is normal. No respiratory distress.     Breath sounds: Normal breath sounds.  Abdominal:     General: Bowel sounds are normal. There is no distension.     Palpations: Abdomen is soft. There is no hepatomegaly, splenomegaly or mass.     Tenderness: There is no abdominal tenderness.  Musculoskeletal:        General: Normal range of motion.     Cervical back: Normal range of motion and neck supple.     Right lower leg: 2+ Edema (soft, no induration, no cords of the calves.) present.     Left lower leg: 2+ Edema (soft, no induration, no cords of the calves.) present.  Lymphadenopathy:     Cervical: No cervical adenopathy.  Skin:    General: Skin is warm and dry.  Neurological:     General: No focal deficit present.     Mental Status: She is alert and oriented to person, place, and time. Mental status is at baseline.  Psychiatric:        Mood and Affect: Mood normal.        Behavior: Behavior normal.        Thought Content: Thought content normal.        Judgment: Judgment normal.     LABS:   CBC Latest Ref Rng & Units 07/30/2021 06/21/2020 01/26/2017  WBC - 7.4 11.2(H) 7.6  Hemoglobin 12.0 - 16.0 14.4 13.2 9.8(L)  Hematocrit 36 - 46 45 41.6 30.6(L)  Platelets 150 - 399 163 148(L) 124(L)   CMP Latest Ref Rng & Units  07/30/2021 06/21/2020 01/26/2017  Glucose 70 - 99 mg/dL - 114(H) 84  BUN 4 - 21 30(A) 40(H) 26(H)  Creatinine 0.5 - 1.1 1.9(A) 2.36(H) 1.92(H)  Sodium 137 - 147 140 137 141  Potassium 3.4 - 5.3 4.3 4.2 3.3(L)  Chloride 99 - 108 106 107 109  CO2 13 - 22 24(A) 19(L) 23  Calcium 8.7 - 10.7 9.0 8.7(L) 8.4(L)  Total Protein 6.5 - 8.1 g/dL - - -  Total Bilirubin 0.3 - 1.2 mg/dL - - -  Alkaline Phos 25 - 125 59 - -  AST 13 - 35 25 - -  ALT 7 - 35 17 - -     No results found for: CEA1 / No results found for: CEA1  Lab Results  Component Value Date   TOTALPROTELP 5.6 (L) 09/22/2016   ALBUMINELP 2.6 (L) 09/22/2016   A1GS 0.3 09/22/2016   A2GS 0.9 09/22/2016   BETS 0.9 09/22/2016   GAMS 0.9 09/22/2016   MSPIKE Not Observed 09/22/2016   SPEI Comment 09/22/2016   Lab Results  Component Value Date   TIBC 262 11/27/2016   TIBC 266 10/27/2016   TIBC 265 10/07/2016   FERRITIN 72 11/27/2016   FERRITIN 118 10/27/2016   IRONPCTSAT 11 11/27/2016   IRONPCTSAT 18 10/27/2016   IRONPCTSAT 17 10/07/2016   No results found for: LDH  STUDIES:  No results found.    I, Rita Ohara, am acting as scribe for Derwood Kaplan, MD  I have reviewed this report as typed by the medical scribe, and it is complete and accurate.

## 2021-07-30 ENCOUNTER — Encounter: Payer: Self-pay | Admitting: Oncology

## 2021-07-30 ENCOUNTER — Other Ambulatory Visit: Payer: Self-pay

## 2021-07-30 ENCOUNTER — Other Ambulatory Visit: Payer: Self-pay | Admitting: Hematology and Oncology

## 2021-07-30 ENCOUNTER — Other Ambulatory Visit: Payer: Self-pay | Admitting: Oncology

## 2021-07-30 ENCOUNTER — Inpatient Hospital Stay: Payer: Medicare HMO | Attending: Oncology

## 2021-07-30 ENCOUNTER — Inpatient Hospital Stay (INDEPENDENT_AMBULATORY_CARE_PROVIDER_SITE_OTHER): Payer: Medicare HMO | Admitting: Oncology

## 2021-07-30 VITALS — BP 138/67 | HR 74 | Temp 98.0°F | Resp 18 | Ht 62.0 in | Wt 203.9 lb

## 2021-07-30 DIAGNOSIS — I824Y3 Acute embolism and thrombosis of unspecified deep veins of proximal lower extremity, bilateral: Secondary | ICD-10-CM

## 2021-07-30 DIAGNOSIS — R059 Cough, unspecified: Secondary | ICD-10-CM | POA: Insufficient documentation

## 2021-07-30 DIAGNOSIS — Z79899 Other long term (current) drug therapy: Secondary | ICD-10-CM | POA: Diagnosis not present

## 2021-07-30 DIAGNOSIS — Z882 Allergy status to sulfonamides status: Secondary | ICD-10-CM | POA: Diagnosis not present

## 2021-07-30 DIAGNOSIS — Z86718 Personal history of other venous thrombosis and embolism: Secondary | ICD-10-CM | POA: Insufficient documentation

## 2021-07-30 DIAGNOSIS — R61 Generalized hyperhidrosis: Secondary | ICD-10-CM | POA: Insufficient documentation

## 2021-07-30 DIAGNOSIS — I2609 Other pulmonary embolism with acute cor pulmonale: Secondary | ICD-10-CM

## 2021-07-30 DIAGNOSIS — R300 Dysuria: Secondary | ICD-10-CM | POA: Insufficient documentation

## 2021-07-30 DIAGNOSIS — R252 Cramp and spasm: Secondary | ICD-10-CM | POA: Insufficient documentation

## 2021-07-30 DIAGNOSIS — D6862 Lupus anticoagulant syndrome: Secondary | ICD-10-CM | POA: Diagnosis not present

## 2021-07-30 DIAGNOSIS — Z880 Allergy status to penicillin: Secondary | ICD-10-CM | POA: Diagnosis not present

## 2021-07-30 DIAGNOSIS — R232 Flushing: Secondary | ICD-10-CM | POA: Diagnosis not present

## 2021-07-30 DIAGNOSIS — R760 Raised antibody titer: Secondary | ICD-10-CM | POA: Diagnosis not present

## 2021-07-30 DIAGNOSIS — I4891 Unspecified atrial fibrillation: Secondary | ICD-10-CM | POA: Diagnosis not present

## 2021-07-30 DIAGNOSIS — N186 End stage renal disease: Secondary | ICD-10-CM | POA: Insufficient documentation

## 2021-07-30 DIAGNOSIS — Z7901 Long term (current) use of anticoagulants: Secondary | ICD-10-CM | POA: Diagnosis not present

## 2021-07-30 DIAGNOSIS — D649 Anemia, unspecified: Secondary | ICD-10-CM | POA: Insufficient documentation

## 2021-07-30 DIAGNOSIS — Z881 Allergy status to other antibiotic agents status: Secondary | ICD-10-CM | POA: Insufficient documentation

## 2021-07-30 DIAGNOSIS — Z992 Dependence on renal dialysis: Secondary | ICD-10-CM | POA: Insufficient documentation

## 2021-07-30 DIAGNOSIS — M549 Dorsalgia, unspecified: Secondary | ICD-10-CM | POA: Insufficient documentation

## 2021-07-30 DIAGNOSIS — R3915 Urgency of urination: Secondary | ICD-10-CM

## 2021-07-30 DIAGNOSIS — I2699 Other pulmonary embolism without acute cor pulmonale: Secondary | ICD-10-CM | POA: Diagnosis not present

## 2021-07-30 LAB — CBC AND DIFFERENTIAL
HCT: 45 (ref 36–46)
Hemoglobin: 14.4 (ref 12.0–16.0)
Neutrophils Absolute: 5.33
Platelets: 163 (ref 150–399)
WBC: 7.4

## 2021-07-30 LAB — HEPATIC FUNCTION PANEL
ALT: 17 (ref 7–35)
AST: 25 (ref 13–35)
Alkaline Phosphatase: 59 (ref 25–125)
Bilirubin, Total: 0.5

## 2021-07-30 LAB — CBC
MCV: 83 (ref 81–99)
RBC: 5.44 — AB (ref 3.87–5.11)

## 2021-07-30 LAB — BASIC METABOLIC PANEL
BUN: 30 — AB (ref 4–21)
CO2: 24 — AB (ref 13–22)
Chloride: 106 (ref 99–108)
Creatinine: 1.9 — AB (ref 0.5–1.1)
Glucose: 112
Potassium: 4.3 (ref 3.4–5.3)
Sodium: 140 (ref 137–147)

## 2021-07-30 LAB — COMPREHENSIVE METABOLIC PANEL
Albumin: 3.9 (ref 3.5–5.0)
Calcium: 9 (ref 8.7–10.7)

## 2021-07-30 LAB — ANTITHROMBIN III: AntiThromb III Func: 117 % (ref 75–120)

## 2021-07-30 MED ORDER — APIXABAN 5 MG PO TABS: 5.0000 mg | ORAL_TABLET | Freq: Two times a day (BID) | ORAL | 3 refills | Status: DC

## 2021-07-31 LAB — PROTEIN C, TOTAL: Protein C, Total: 94 % (ref 60–150)

## 2021-08-01 LAB — DRVVT MIX: dRVVT Mix: 76.6 s — ABNORMAL HIGH (ref 0.0–40.4)

## 2021-08-01 LAB — LUPUS ANTICOAGULANT PANEL
DRVVT: 139.4 s — ABNORMAL HIGH (ref 0.0–47.0)
PTT Lupus Anticoagulant: 42 s (ref 0.0–51.9)

## 2021-08-01 LAB — PROTEIN S, TOTAL AND FREE
Protein S Ag, Free: 120 % (ref 61–136)
Protein S Ag, Total: 152 % — ABNORMAL HIGH (ref 60–150)

## 2021-08-01 LAB — CARDIOLIPIN ANTIBODIES, IGM+IGG
Anticardiolipin IgG: <9 GPL U/mL (ref 0–14)
Anticardiolipin IgM: <9 MPL U/mL (ref 0–12)

## 2021-08-01 LAB — DRVVT CONFIRM: dRVVT Confirm: 1.5 ratio — ABNORMAL HIGH (ref 0.8–1.2)

## 2021-08-02 LAB — BETA-2-GLYCOPROTEIN I ABS, IGG/M/A
Beta-2 Glyco I IgG: <9 GPI IgG units (ref 0–20)
Beta-2-Glycoprotein I IgA: <9 GPI IgA units (ref 0–25)
Beta-2-Glycoprotein I IgM: <9 GPI IgM units (ref 0–32)

## 2021-08-05 ENCOUNTER — Encounter: Payer: Self-pay | Admitting: Oncology

## 2021-08-07 ENCOUNTER — Encounter: Payer: Self-pay | Admitting: Oncology

## 2021-08-07 DIAGNOSIS — R760 Raised antibody titer: Secondary | ICD-10-CM | POA: Insufficient documentation

## 2021-08-07 HISTORY — DX: Raised antibody titer: R76.0

## 2021-08-07 LAB — PROTHROMBIN GENE MUTATION

## 2021-08-08 DIAGNOSIS — Z86718 Personal history of other venous thrombosis and embolism: Secondary | ICD-10-CM | POA: Diagnosis not present

## 2021-08-08 DIAGNOSIS — Z6838 Body mass index (BMI) 38.0-38.9, adult: Secondary | ICD-10-CM | POA: Diagnosis not present

## 2021-08-08 DIAGNOSIS — E1121 Type 2 diabetes mellitus with diabetic nephropathy: Secondary | ICD-10-CM | POA: Diagnosis not present

## 2021-08-08 DIAGNOSIS — M25562 Pain in left knee: Secondary | ICD-10-CM | POA: Diagnosis not present

## 2021-08-08 DIAGNOSIS — N186 End stage renal disease: Secondary | ICD-10-CM | POA: Diagnosis not present

## 2021-08-08 DIAGNOSIS — I700 Atherosclerosis of aorta: Secondary | ICD-10-CM | POA: Diagnosis not present

## 2021-08-08 DIAGNOSIS — M25561 Pain in right knee: Secondary | ICD-10-CM | POA: Diagnosis not present

## 2021-08-08 DIAGNOSIS — Z01818 Encounter for other preprocedural examination: Secondary | ICD-10-CM | POA: Diagnosis not present

## 2021-08-08 DIAGNOSIS — E669 Obesity, unspecified: Secondary | ICD-10-CM | POA: Diagnosis not present

## 2021-08-08 DIAGNOSIS — N12 Tubulo-interstitial nephritis, not specified as acute or chronic: Secondary | ICD-10-CM | POA: Diagnosis not present

## 2021-08-08 DIAGNOSIS — N1832 Chronic kidney disease, stage 3b: Secondary | ICD-10-CM | POA: Diagnosis not present

## 2021-08-08 DIAGNOSIS — Z8669 Personal history of other diseases of the nervous system and sense organs: Secondary | ICD-10-CM | POA: Diagnosis not present

## 2021-08-08 DIAGNOSIS — Z86711 Personal history of pulmonary embolism: Secondary | ICD-10-CM | POA: Diagnosis not present

## 2021-08-13 ENCOUNTER — Other Ambulatory Visit: Payer: Self-pay | Admitting: Oncology

## 2021-08-13 DIAGNOSIS — R760 Raised antibody titer: Secondary | ICD-10-CM

## 2021-08-17 DIAGNOSIS — R1084 Generalized abdominal pain: Secondary | ICD-10-CM | POA: Diagnosis not present

## 2021-08-18 DIAGNOSIS — K449 Diaphragmatic hernia without obstruction or gangrene: Secondary | ICD-10-CM | POA: Diagnosis not present

## 2021-08-18 DIAGNOSIS — R509 Fever, unspecified: Secondary | ICD-10-CM | POA: Diagnosis not present

## 2021-08-18 DIAGNOSIS — K5792 Diverticulitis of intestine, part unspecified, without perforation or abscess without bleeding: Secondary | ICD-10-CM | POA: Diagnosis not present

## 2021-08-18 DIAGNOSIS — Z20822 Contact with and (suspected) exposure to covid-19: Secondary | ICD-10-CM | POA: Diagnosis not present

## 2021-08-18 DIAGNOSIS — N390 Urinary tract infection, site not specified: Secondary | ICD-10-CM | POA: Diagnosis not present

## 2021-08-18 DIAGNOSIS — N261 Atrophy of kidney (terminal): Secondary | ICD-10-CM | POA: Diagnosis not present

## 2021-08-18 DIAGNOSIS — N19 Unspecified kidney failure: Secondary | ICD-10-CM | POA: Diagnosis not present

## 2021-09-19 DIAGNOSIS — M1711 Unilateral primary osteoarthritis, right knee: Secondary | ICD-10-CM | POA: Diagnosis not present

## 2021-09-19 DIAGNOSIS — M1712 Unilateral primary osteoarthritis, left knee: Secondary | ICD-10-CM | POA: Diagnosis not present

## 2021-09-19 DIAGNOSIS — M170 Bilateral primary osteoarthritis of knee: Secondary | ICD-10-CM | POA: Diagnosis not present

## 2021-09-25 DIAGNOSIS — Z01818 Encounter for other preprocedural examination: Secondary | ICD-10-CM | POA: Diagnosis not present

## 2021-09-25 DIAGNOSIS — M79609 Pain in unspecified limb: Secondary | ICD-10-CM | POA: Diagnosis not present

## 2021-09-25 DIAGNOSIS — Z79899 Other long term (current) drug therapy: Secondary | ICD-10-CM | POA: Diagnosis not present

## 2021-09-25 DIAGNOSIS — E559 Vitamin D deficiency, unspecified: Secondary | ICD-10-CM | POA: Diagnosis not present

## 2021-10-01 DIAGNOSIS — M7542 Impingement syndrome of left shoulder: Secondary | ICD-10-CM | POA: Diagnosis not present

## 2021-10-21 DIAGNOSIS — M1712 Unilateral primary osteoarthritis, left knee: Secondary | ICD-10-CM | POA: Diagnosis not present

## 2021-10-21 DIAGNOSIS — M7989 Other specified soft tissue disorders: Secondary | ICD-10-CM | POA: Diagnosis not present

## 2021-10-21 DIAGNOSIS — G8918 Other acute postprocedural pain: Secondary | ICD-10-CM | POA: Diagnosis not present

## 2021-10-21 DIAGNOSIS — E669 Obesity, unspecified: Secondary | ICD-10-CM | POA: Diagnosis not present

## 2021-10-21 DIAGNOSIS — I509 Heart failure, unspecified: Secondary | ICD-10-CM | POA: Diagnosis not present

## 2021-10-23 DIAGNOSIS — I132 Hypertensive heart and chronic kidney disease with heart failure and with stage 5 chronic kidney disease, or end stage renal disease: Secondary | ICD-10-CM | POA: Diagnosis not present

## 2021-10-23 DIAGNOSIS — K219 Gastro-esophageal reflux disease without esophagitis: Secondary | ICD-10-CM | POA: Diagnosis not present

## 2021-10-23 DIAGNOSIS — I509 Heart failure, unspecified: Secondary | ICD-10-CM | POA: Diagnosis not present

## 2021-10-23 DIAGNOSIS — Z7401 Bed confinement status: Secondary | ICD-10-CM | POA: Diagnosis not present

## 2021-10-23 DIAGNOSIS — I951 Orthostatic hypotension: Secondary | ICD-10-CM | POA: Diagnosis not present

## 2021-10-23 DIAGNOSIS — I2782 Chronic pulmonary embolism: Secondary | ICD-10-CM | POA: Diagnosis not present

## 2021-10-23 DIAGNOSIS — I4891 Unspecified atrial fibrillation: Secondary | ICD-10-CM | POA: Diagnosis not present

## 2021-10-23 DIAGNOSIS — R531 Weakness: Secondary | ICD-10-CM | POA: Diagnosis not present

## 2021-10-23 DIAGNOSIS — Z96652 Presence of left artificial knee joint: Secondary | ICD-10-CM | POA: Diagnosis not present

## 2021-10-23 DIAGNOSIS — N183 Chronic kidney disease, stage 3 unspecified: Secondary | ICD-10-CM | POA: Diagnosis not present

## 2021-10-23 DIAGNOSIS — E039 Hypothyroidism, unspecified: Secondary | ICD-10-CM | POA: Diagnosis not present

## 2021-10-23 DIAGNOSIS — M1712 Unilateral primary osteoarthritis, left knee: Secondary | ICD-10-CM | POA: Diagnosis not present

## 2021-10-23 DIAGNOSIS — Z471 Aftercare following joint replacement surgery: Secondary | ICD-10-CM | POA: Diagnosis not present

## 2021-10-23 DIAGNOSIS — F329 Major depressive disorder, single episode, unspecified: Secondary | ICD-10-CM | POA: Diagnosis not present

## 2021-10-23 DIAGNOSIS — G4700 Insomnia, unspecified: Secondary | ICD-10-CM | POA: Diagnosis not present

## 2021-10-23 DIAGNOSIS — E785 Hyperlipidemia, unspecified: Secondary | ICD-10-CM | POA: Diagnosis not present

## 2021-10-31 DIAGNOSIS — K219 Gastro-esophageal reflux disease without esophagitis: Secondary | ICD-10-CM | POA: Diagnosis not present

## 2021-10-31 DIAGNOSIS — F330 Major depressive disorder, recurrent, mild: Secondary | ICD-10-CM | POA: Diagnosis not present

## 2021-10-31 DIAGNOSIS — G4700 Insomnia, unspecified: Secondary | ICD-10-CM | POA: Diagnosis not present

## 2021-10-31 DIAGNOSIS — Z7401 Bed confinement status: Secondary | ICD-10-CM | POA: Diagnosis not present

## 2021-10-31 DIAGNOSIS — D649 Anemia, unspecified: Secondary | ICD-10-CM | POA: Diagnosis not present

## 2021-10-31 DIAGNOSIS — R531 Weakness: Secondary | ICD-10-CM | POA: Diagnosis not present

## 2021-10-31 DIAGNOSIS — R262 Difficulty in walking, not elsewhere classified: Secondary | ICD-10-CM | POA: Diagnosis not present

## 2021-10-31 DIAGNOSIS — Z471 Aftercare following joint replacement surgery: Secondary | ICD-10-CM | POA: Diagnosis not present

## 2021-10-31 DIAGNOSIS — F5101 Primary insomnia: Secondary | ICD-10-CM | POA: Diagnosis not present

## 2021-10-31 DIAGNOSIS — M1712 Unilateral primary osteoarthritis, left knee: Secondary | ICD-10-CM | POA: Diagnosis not present

## 2021-10-31 DIAGNOSIS — F411 Generalized anxiety disorder: Secondary | ICD-10-CM | POA: Diagnosis not present

## 2021-10-31 DIAGNOSIS — G8918 Other acute postprocedural pain: Secondary | ICD-10-CM | POA: Diagnosis not present

## 2021-10-31 DIAGNOSIS — F329 Major depressive disorder, single episode, unspecified: Secondary | ICD-10-CM | POA: Diagnosis not present

## 2021-10-31 DIAGNOSIS — E039 Hypothyroidism, unspecified: Secondary | ICD-10-CM | POA: Diagnosis not present

## 2021-10-31 DIAGNOSIS — Z96652 Presence of left artificial knee joint: Secondary | ICD-10-CM | POA: Diagnosis not present

## 2021-11-01 DIAGNOSIS — R262 Difficulty in walking, not elsewhere classified: Secondary | ICD-10-CM | POA: Diagnosis not present

## 2021-11-01 DIAGNOSIS — D649 Anemia, unspecified: Secondary | ICD-10-CM | POA: Diagnosis not present

## 2021-11-01 DIAGNOSIS — G8918 Other acute postprocedural pain: Secondary | ICD-10-CM | POA: Diagnosis not present

## 2021-11-01 DIAGNOSIS — Z96652 Presence of left artificial knee joint: Secondary | ICD-10-CM | POA: Diagnosis not present

## 2021-11-27 DIAGNOSIS — Z09 Encounter for follow-up examination after completed treatment for conditions other than malignant neoplasm: Secondary | ICD-10-CM | POA: Diagnosis not present

## 2021-11-27 DIAGNOSIS — Z96652 Presence of left artificial knee joint: Secondary | ICD-10-CM | POA: Diagnosis not present

## 2021-11-27 DIAGNOSIS — Z6835 Body mass index (BMI) 35.0-35.9, adult: Secondary | ICD-10-CM | POA: Diagnosis not present

## 2021-11-27 DIAGNOSIS — I2699 Other pulmonary embolism without acute cor pulmonale: Secondary | ICD-10-CM | POA: Diagnosis not present

## 2021-12-17 DIAGNOSIS — M1712 Unilateral primary osteoarthritis, left knee: Secondary | ICD-10-CM | POA: Diagnosis not present

## 2021-12-27 ENCOUNTER — Other Ambulatory Visit: Payer: Self-pay

## 2021-12-27 ENCOUNTER — Inpatient Hospital Stay: Payer: Medicare HMO | Attending: Oncology

## 2021-12-27 ENCOUNTER — Other Ambulatory Visit: Payer: Self-pay | Admitting: Oncology

## 2021-12-27 ENCOUNTER — Inpatient Hospital Stay: Payer: Medicare HMO | Admitting: Oncology

## 2021-12-27 ENCOUNTER — Encounter: Payer: Self-pay | Admitting: Oncology

## 2021-12-27 ENCOUNTER — Other Ambulatory Visit: Payer: Self-pay | Admitting: Hematology and Oncology

## 2021-12-27 VITALS — BP 111/57 | HR 84 | Temp 98.2°F | Resp 18 | Ht 62.8 in | Wt 202.5 lb

## 2021-12-27 DIAGNOSIS — Z86718 Personal history of other venous thrombosis and embolism: Secondary | ICD-10-CM | POA: Diagnosis not present

## 2021-12-27 DIAGNOSIS — Z79899 Other long term (current) drug therapy: Secondary | ICD-10-CM | POA: Diagnosis not present

## 2021-12-27 DIAGNOSIS — Z881 Allergy status to other antibiotic agents status: Secondary | ICD-10-CM | POA: Diagnosis not present

## 2021-12-27 DIAGNOSIS — R760 Raised antibody titer: Secondary | ICD-10-CM

## 2021-12-27 DIAGNOSIS — I2609 Other pulmonary embolism with acute cor pulmonale: Secondary | ICD-10-CM

## 2021-12-27 DIAGNOSIS — I4891 Unspecified atrial fibrillation: Secondary | ICD-10-CM | POA: Diagnosis not present

## 2021-12-27 DIAGNOSIS — Z992 Dependence on renal dialysis: Secondary | ICD-10-CM | POA: Insufficient documentation

## 2021-12-27 DIAGNOSIS — Z7901 Long term (current) use of anticoagulants: Secondary | ICD-10-CM | POA: Insufficient documentation

## 2021-12-27 DIAGNOSIS — Z882 Allergy status to sulfonamides status: Secondary | ICD-10-CM | POA: Insufficient documentation

## 2021-12-27 DIAGNOSIS — Z880 Allergy status to penicillin: Secondary | ICD-10-CM | POA: Insufficient documentation

## 2021-12-27 DIAGNOSIS — D649 Anemia, unspecified: Secondary | ICD-10-CM | POA: Diagnosis not present

## 2021-12-27 DIAGNOSIS — N186 End stage renal disease: Secondary | ICD-10-CM | POA: Insufficient documentation

## 2021-12-27 LAB — BASIC METABOLIC PANEL
BUN: 22 — AB (ref 4–21)
CO2: 20 (ref 13–22)
Chloride: 107 (ref 99–108)
Creatinine: 2 — AB (ref 0.5–1.1)
Glucose: 163
Potassium: 3.8 mEq/L (ref 3.5–5.1)
Sodium: 141 (ref 137–147)

## 2021-12-27 LAB — HEPATIC FUNCTION PANEL
ALT: 16 U/L (ref 7–35)
AST: 21 (ref 13–35)
Alkaline Phosphatase: 74 (ref 25–125)
Bilirubin, Total: 0.5

## 2021-12-27 LAB — COMPREHENSIVE METABOLIC PANEL
Albumin: 4.1 (ref 3.5–5.0)
Calcium: 8.9 (ref 8.7–10.7)

## 2021-12-27 LAB — CBC AND DIFFERENTIAL
HCT: 245 — AB (ref 36–46)
Hemoglobin: 13.8 (ref 12.0–16.0)
Neutrophils Absolute: 6.67
WBC: 9.4

## 2021-12-27 LAB — CBC: RBC: 5.19 — AB (ref 3.87–5.11)

## 2021-12-27 MED ORDER — RIVAROXABAN 20 MG PO TABS: 20.0000 mg | ORAL_TABLET | Freq: Every day | ORAL | 3 refills | Status: DC

## 2021-12-27 NOTE — Progress Notes (Signed)
A ? ?[facility]  ?[address] ?[place],  [place]  [address] ?[phone] ? ?Clinic Day: December 27, 2021 ? ?Referring physician: Ronita Hipps, MD ? ?ASSESSMENT & PLAN:  ? ?1. Multiple recurrences of DVT and PE, and now recurrent despite compliance with Xarelto, although at dose of 15 mg bid, adjusted for her renal function.  I think this represents a failure of Xarelto, but she is allergic to Lovenox and Arixtra could not be prescribed due to her renal insufficiency. She was therefore placed on Eliquis 5 mg BID.  However she admits she is missing the second dose often.  The testing for lupus anticoagulant was positive and the rest was normal including beta-2 glycoprotein and anticardiolipin.  We need to repeat this test for confirmation and if confirmed, she will need lifelong anticoagulation.  Dr. Helene Kelp can manage this but I will be glad to be available for any questions or problems. ? ?2. CKD, with prior dialysis, on waiting list for transplant. Her creatinine has improved now measuring 1.9. ? ?3. Prior anemia, resolved.  Her hemoglobin is up to 13.8 now. ? ?4.  Partial compliance with anticoagulation.  I feel it is critical that she take the full dose of Eliquis with her history of multiple thromboses, and she admits she is missing the afternoon dose many times.  I will therefore change her to a once a day anticoagulant with Xarelto 20 mg daily so she will be sure to get the full dose. ? ? ? ?In view of her multiple recurrences of DVT and PE, I did a full hypercoagulable workup and she is positive for lupus anticoagulant but the beta-2 glycoprotein and anticardiolipin are normal.  I will have the lupus anticoagulant test repeated for confirmation and call her with that result.[name]  She will change to Xarelto 20 mg daily, and I will send in a 3 month supply to her mail-in pharmacy along with refills.  Her primary care physician can take over prescribing her anticoagulation.  She  will need lifelong anticoagulation.  I will call her with the results, and I can see her back as needed if questions arise regarding her anticoagulation.  She understands and agrees with this plan of care. ? ?I provided 20 minutes of face-to-face time during this this encounter and > 50% was spent counseling as documented under my assessment and plan.  ? ? ?Derwood Kaplan, MD ?[facility] ?[place] [address] ?[place] [address] ?Dept: [phone] ?Dept Fax: [phone]  ? ? ?CHIEF COMPLAINT:  ?CC: History of chronic DVT and PE ? ?Current Treatment:  Eliquis 5 mg BID ? ? ?HISTORY OF PRESENT ILLNESS:  ?Rachael [name] is a 66 y.o. female with a history of of DVT and atrial fibrillation who has been on Xarelto 15 mg bid and has been compliant.  She came to the hospital in October for worsening dyspnea and lower anterior chest pain associated with dizziness.  They were unable to perform a CTA due to CKD stage 5, and so a V/Q ventilation perfusion scan was obtained and revealed a large lobar perfusion defect of the right upper lobe, with a high probability pulmonary embolism.  She tells me her mother had blood clots and some aunts as well and she thinks they may have had Factor V Leiden.  Factor V Leiden was obtained and negative. Bilateral lower extremity U/S was negative. Echocardiogram was normal with an EF  between 60-65%.  She has a history of chronic DVT and PE, with episodes in 2018 and 2019, and has been on warfarin in the past. She reminds me that she had experienced itching with Lovenox in the past, but I asked her to try it again, since I think she has failed the Xarelto. However, she was unable to tolerate it. She was then placed on Eliquis at 5 mg BID. She is currently awaiting a kidney transplant and has previously been on dialysis.  ?  ?INTERVAL HISTORY:  ?I have reviewed her chart and materials related to her cancer extensively and  collaborated history with the patient. Summary of oncologic history is as follows: ?Oncology History  ? No history exists.  ? ? ?[name] is here for follow up and she had total knee replacement in February.  She did well with it and is getting along fair.  She does have a companion with her today.  CBC is normal and she has a creatinine of 2.0 with a BUN of 22, just slightly worse.  Nonfasting blood sugars is 163 and the rest of her CMP is normal.. Her  appetite is good, and she denies any significant weight loss  She denies fever, chills or other signs of infection.  She denies nausea, vomiting, bowel issues, or abdominal pain.  She denies sore throat, dyspnea, or chest pain. ? ?HISTORY:  ? ?Allergies:  ?Allergies  ?Allergen Reactions  ? Lovenox [Enoxaparin] Itching  ? Pantoprazole Anaphylaxis and Anxiety  ?  kidney  ? Proton Pump Inhibitors   ?  Biopsy proven acute interstitial nephritis; required dialysis in 09/2016  ? Penicillins Rash and Other (See Comments)  ?  Tolerates rocephin  ? Sulfa Antibiotics Rash  ? ? ?Current Medications: ?Current Outpatient Medications  ?Medication Sig Dispense Refill  ? APPLE CIDER VINEGAR PO Take by mouth. Gummy    ? Biotin w/ Vitamins C & E (HAIR/SKIN/NAILS PO) Take by mouth. gummies    ? Cholecalciferol (VITAMIN D) 50 MCG (2000 UT) tablet Take 2,000 Units by mouth daily. Gummies    ? citalopram (CELEXA) 40 MG tablet Take by mouth.    ? famotidine (PEPCID) 20 MG tablet Take 1 tablet (20 mg total) by mouth 2 (two) times daily. (Patient taking differently: Take 20 mg by mouth 2 (two) times daily as needed for heartburn.) 60 tablet 0  ? furosemide (LASIX) 40 MG tablet Take 40 mg by mouth as needed for fluid.     ? levothyroxine (SYNTHROID) 100 MCG tablet Take 100 mcg by mouth daily.    ? melatonin 5 MG TABS Take by mouth at bedtime as needed.    ? rosuvastatin (CRESTOR) 5 MG tablet Take 5 mg by mouth 3 (three) times a week.    ? topiramate (TOPAMAX) 25 MG tablet Take by mouth.    ?  rivaroxaban (XARELTO) 20 MG TABS tablet Take 1 tablet (20 mg total) by mouth daily with supper. 90 tablet 3  ? ?No current facility-administered medications for this visit.  ? ? ?REVIEW OF SYSTEMS:  ?Review of Systems  ?Constitutional: Negative.  Negative for appetite change, chills, fatigue, fever and unexpected weight change.  ?HENT:  Negative.    ?Eyes: Negative.   ?Respiratory:  Negative for chest tightness, hemoptysis, shortness of breath and wheezing. Cough: dry.  ?Cardiovascular: Negative.  Negative for chest pain, leg swelling and palpitations.  ?Gastrointestinal: Negative.  Negative for abdominal distention, abdominal pain, blood in stool, constipation, diarrhea, nausea and vomiting.  ?Endocrine:  Hot flashes: night sweats.  ?Genitourinary:  Negative for difficulty urinating and hematuria. Dysuria: mild. Frequency: urgency.  ?Musculoskeletal:  Negative for arthralgias, flank pain, gait problem and myalgias.  ?     Cramps of the legs and feet, chronic  ?Skin: Negative.   ?Neurological: Negative.  Negative for dizziness, extremity weakness, gait problem, headaches, light-headedness, numbness, seizures and speech difficulty.  ?Hematological: Negative.   ?Psychiatric/Behavioral: Negative.  Negative for depression and sleep disturbance. The patient is not nervous/anxious.    ? ? ?VITALS:  ?Blood pressure (!) 111/57, pulse 84, temperature 98.2 ?F (36.8 ?C), temperature source Oral, resp. rate 18, height 5' 2.8" (1.595 m), weight 202 lb 8 oz (91.9 kg), SpO2 96 %.  ?Wt Readings from Last 3 Encounters:  ?12/27/21 202 lb 8 oz (91.9 kg)  ?07/30/21 203 lb 14.4 oz (92.5 kg)  ?06/21/20 208 lb (94.3 kg)  ?  ?Body mass index is 36.1 kg/m?. ? ?Performance status (ECOG): 1 - Symptomatic but completely ambulatory ? ?PHYSICAL EXAM:  ?Physical Exam ?Constitutional:   ?   General: She is not in acute distress. ?   Appearance: Normal appearance. She is normal weight.  ?HENT:  ?   Head: Normocephalic and atraumatic.  ?Eyes:  ?    General: No scleral icterus. ?   Extraocular Movements: Extraocular movements intact.  ?   Conjunctiva/sclera: Conjunctivae normal.  ?   Pupils: Pupils are equal, round, and reactive to light.  ?Cardiovascular:  ?   R

## 2021-12-28 LAB — BETA 2 MICROGLOBULIN, SERUM: Beta-2 Microglobulin: 5.9 mg/L — ABNORMAL HIGH (ref 0.6–2.4)

## 2021-12-29 LAB — CARDIOLIPIN ANTIBODIES, IGM+IGG
Anticardiolipin IgG: <9 GPL U/mL (ref 0–14)
Anticardiolipin IgM: 11 MPL U/mL (ref 0–12)

## 2021-12-29 LAB — DRVVT CONFIRM: dRVVT Confirm: 1.1 ratio (ref 0.8–1.2)

## 2021-12-29 LAB — LUPUS ANTICOAGULANT PANEL
DRVVT: 75.1 s — ABNORMAL HIGH (ref 0.0–47.0)
PTT Lupus Anticoagulant: 37.9 s (ref 0.0–43.5)

## 2021-12-29 LAB — DRVVT MIX: dRVVT Mix: 55.3 s — ABNORMAL HIGH (ref 0.0–40.4)

## 2022-01-07 DIAGNOSIS — M1711 Unilateral primary osteoarthritis, right knee: Secondary | ICD-10-CM | POA: Diagnosis not present

## 2022-01-08 ENCOUNTER — Telehealth: Payer: Self-pay

## 2022-01-08 NOTE — Telephone Encounter (Signed)
-----   Message from Derwood Kaplan, MD sent at 01/08/2022  1:28 PM EDT ----- ?Regarding: call ?Tell her the lupus anticoagulant test is positive again, so that confirms it.  Send copy to Dr. Helene Kelp ? ?

## 2022-01-08 NOTE — Telephone Encounter (Signed)
Patient notified. Labs will be sent to DR. Helene Kelp ? ?

## 2022-01-10 DIAGNOSIS — Z1231 Encounter for screening mammogram for malignant neoplasm of breast: Secondary | ICD-10-CM | POA: Diagnosis not present

## 2022-01-10 DIAGNOSIS — Z79899 Other long term (current) drug therapy: Secondary | ICD-10-CM | POA: Diagnosis not present

## 2022-01-10 DIAGNOSIS — M79609 Pain in unspecified limb: Secondary | ICD-10-CM | POA: Diagnosis not present

## 2022-01-27 DIAGNOSIS — I2510 Atherosclerotic heart disease of native coronary artery without angina pectoris: Secondary | ICD-10-CM | POA: Diagnosis not present

## 2022-01-27 DIAGNOSIS — G8918 Other acute postprocedural pain: Secondary | ICD-10-CM | POA: Diagnosis not present

## 2022-01-27 DIAGNOSIS — Z471 Aftercare following joint replacement surgery: Secondary | ICD-10-CM | POA: Diagnosis not present

## 2022-01-27 DIAGNOSIS — M1711 Unilateral primary osteoarthritis, right knee: Secondary | ICD-10-CM | POA: Diagnosis not present

## 2022-01-27 DIAGNOSIS — Z96651 Presence of right artificial knee joint: Secondary | ICD-10-CM | POA: Diagnosis not present

## 2022-01-27 DIAGNOSIS — E669 Obesity, unspecified: Secondary | ICD-10-CM | POA: Diagnosis not present

## 2022-01-28 DIAGNOSIS — N183 Chronic kidney disease, stage 3 unspecified: Secondary | ICD-10-CM | POA: Diagnosis not present

## 2022-01-28 DIAGNOSIS — M1711 Unilateral primary osteoarthritis, right knee: Secondary | ICD-10-CM | POA: Diagnosis not present

## 2022-01-28 DIAGNOSIS — K219 Gastro-esophageal reflux disease without esophagitis: Secondary | ICD-10-CM | POA: Diagnosis not present

## 2022-01-28 DIAGNOSIS — I129 Hypertensive chronic kidney disease with stage 1 through stage 4 chronic kidney disease, or unspecified chronic kidney disease: Secondary | ICD-10-CM | POA: Diagnosis not present

## 2022-01-28 DIAGNOSIS — F17210 Nicotine dependence, cigarettes, uncomplicated: Secondary | ICD-10-CM | POA: Diagnosis not present

## 2022-01-28 DIAGNOSIS — F32A Depression, unspecified: Secondary | ICD-10-CM | POA: Diagnosis not present

## 2022-01-28 DIAGNOSIS — E039 Hypothyroidism, unspecified: Secondary | ICD-10-CM | POA: Diagnosis not present

## 2022-01-28 DIAGNOSIS — E7800 Pure hypercholesterolemia, unspecified: Secondary | ICD-10-CM | POA: Diagnosis not present

## 2022-01-28 DIAGNOSIS — I4891 Unspecified atrial fibrillation: Secondary | ICD-10-CM | POA: Diagnosis not present

## 2022-03-25 DIAGNOSIS — Z96651 Presence of right artificial knee joint: Secondary | ICD-10-CM | POA: Diagnosis not present

## 2022-03-25 DIAGNOSIS — M1711 Unilateral primary osteoarthritis, right knee: Secondary | ICD-10-CM | POA: Diagnosis not present

## 2022-04-25 DIAGNOSIS — R413 Other amnesia: Secondary | ICD-10-CM | POA: Diagnosis not present

## 2022-04-25 DIAGNOSIS — E039 Hypothyroidism, unspecified: Secondary | ICD-10-CM | POA: Diagnosis not present

## 2022-04-25 DIAGNOSIS — I2699 Other pulmonary embolism without acute cor pulmonale: Secondary | ICD-10-CM | POA: Diagnosis not present

## 2022-04-25 DIAGNOSIS — G43909 Migraine, unspecified, not intractable, without status migrainosus: Secondary | ICD-10-CM | POA: Diagnosis not present

## 2022-04-25 DIAGNOSIS — I82422 Acute embolism and thrombosis of left iliac vein: Secondary | ICD-10-CM | POA: Diagnosis not present

## 2022-04-25 DIAGNOSIS — R55 Syncope and collapse: Secondary | ICD-10-CM | POA: Diagnosis not present

## 2022-04-25 DIAGNOSIS — N184 Chronic kidney disease, stage 4 (severe): Secondary | ICD-10-CM | POA: Diagnosis not present

## 2022-04-25 DIAGNOSIS — R0609 Other forms of dyspnea: Secondary | ICD-10-CM | POA: Diagnosis not present

## 2022-04-25 DIAGNOSIS — Z6836 Body mass index (BMI) 36.0-36.9, adult: Secondary | ICD-10-CM | POA: Diagnosis not present

## 2022-04-25 DIAGNOSIS — I2692 Saddle embolus of pulmonary artery without acute cor pulmonale: Secondary | ICD-10-CM | POA: Diagnosis not present

## 2022-04-25 DIAGNOSIS — R42 Dizziness and giddiness: Secondary | ICD-10-CM | POA: Diagnosis not present

## 2022-04-25 DIAGNOSIS — E860 Dehydration: Secondary | ICD-10-CM | POA: Diagnosis not present

## 2022-04-25 DIAGNOSIS — R0600 Dyspnea, unspecified: Secondary | ICD-10-CM | POA: Diagnosis not present

## 2022-04-25 DIAGNOSIS — G250 Essential tremor: Secondary | ICD-10-CM | POA: Diagnosis not present

## 2022-04-28 DIAGNOSIS — E039 Hypothyroidism, unspecified: Secondary | ICD-10-CM | POA: Diagnosis not present

## 2022-04-28 DIAGNOSIS — Z6836 Body mass index (BMI) 36.0-36.9, adult: Secondary | ICD-10-CM | POA: Diagnosis not present

## 2022-04-29 DIAGNOSIS — M7542 Impingement syndrome of left shoulder: Secondary | ICD-10-CM | POA: Diagnosis not present

## 2022-05-02 DIAGNOSIS — R42 Dizziness and giddiness: Secondary | ICD-10-CM | POA: Diagnosis not present

## 2022-05-02 DIAGNOSIS — R413 Other amnesia: Secondary | ICD-10-CM | POA: Diagnosis not present

## 2022-05-02 DIAGNOSIS — R531 Weakness: Secondary | ICD-10-CM | POA: Diagnosis not present

## 2022-05-23 DIAGNOSIS — I480 Paroxysmal atrial fibrillation: Secondary | ICD-10-CM | POA: Diagnosis not present

## 2022-05-23 DIAGNOSIS — Z114 Encounter for screening for human immunodeficiency virus [HIV]: Secondary | ICD-10-CM | POA: Diagnosis not present

## 2022-05-23 DIAGNOSIS — I82493 Acute embolism and thrombosis of other specified deep vein of lower extremity, bilateral: Secondary | ICD-10-CM | POA: Diagnosis not present

## 2022-05-23 DIAGNOSIS — I10 Essential (primary) hypertension: Secondary | ICD-10-CM | POA: Diagnosis not present

## 2022-05-23 DIAGNOSIS — Z1159 Encounter for screening for other viral diseases: Secondary | ICD-10-CM | POA: Diagnosis not present

## 2022-05-23 DIAGNOSIS — Z7682 Awaiting organ transplant status: Secondary | ICD-10-CM | POA: Diagnosis not present

## 2022-05-23 DIAGNOSIS — I82413 Acute embolism and thrombosis of femoral vein, bilateral: Secondary | ICD-10-CM | POA: Diagnosis not present

## 2022-05-23 DIAGNOSIS — E669 Obesity, unspecified: Secondary | ICD-10-CM | POA: Diagnosis not present

## 2022-05-23 DIAGNOSIS — N185 Chronic kidney disease, stage 5: Secondary | ICD-10-CM | POA: Diagnosis not present

## 2022-05-27 DIAGNOSIS — E785 Hyperlipidemia, unspecified: Secondary | ICD-10-CM | POA: Diagnosis not present

## 2022-05-28 DIAGNOSIS — H5213 Myopia, bilateral: Secondary | ICD-10-CM | POA: Diagnosis not present

## 2022-05-28 DIAGNOSIS — H52209 Unspecified astigmatism, unspecified eye: Secondary | ICD-10-CM | POA: Diagnosis not present

## 2022-05-28 DIAGNOSIS — H5203 Hypermetropia, bilateral: Secondary | ICD-10-CM | POA: Diagnosis not present

## 2022-05-28 DIAGNOSIS — H524 Presbyopia: Secondary | ICD-10-CM | POA: Diagnosis not present

## 2022-06-07 DIAGNOSIS — E039 Hypothyroidism, unspecified: Secondary | ICD-10-CM | POA: Diagnosis not present

## 2022-06-07 DIAGNOSIS — N184 Chronic kidney disease, stage 4 (severe): Secondary | ICD-10-CM | POA: Diagnosis not present

## 2022-06-07 DIAGNOSIS — E785 Hyperlipidemia, unspecified: Secondary | ICD-10-CM | POA: Diagnosis not present

## 2022-06-10 DIAGNOSIS — M940 Chondrocostal junction syndrome [Tietze]: Secondary | ICD-10-CM | POA: Diagnosis not present

## 2022-06-10 DIAGNOSIS — R079 Chest pain, unspecified: Secondary | ICD-10-CM | POA: Diagnosis not present

## 2022-06-10 DIAGNOSIS — R0789 Other chest pain: Secondary | ICD-10-CM | POA: Diagnosis not present

## 2022-06-10 DIAGNOSIS — R091 Pleurisy: Secondary | ICD-10-CM | POA: Diagnosis not present

## 2022-10-07 DIAGNOSIS — S61412A Laceration without foreign body of left hand, initial encounter: Secondary | ICD-10-CM | POA: Diagnosis not present

## 2022-10-07 DIAGNOSIS — M79642 Pain in left hand: Secondary | ICD-10-CM | POA: Diagnosis not present

## 2022-10-09 DIAGNOSIS — M1711 Unilateral primary osteoarthritis, right knee: Secondary | ICD-10-CM | POA: Diagnosis not present

## 2022-10-09 DIAGNOSIS — M1712 Unilateral primary osteoarthritis, left knee: Secondary | ICD-10-CM | POA: Diagnosis not present

## 2022-10-09 DIAGNOSIS — M170 Bilateral primary osteoarthritis of knee: Secondary | ICD-10-CM | POA: Diagnosis not present

## 2022-10-10 DIAGNOSIS — M79642 Pain in left hand: Secondary | ICD-10-CM | POA: Diagnosis not present

## 2022-10-10 DIAGNOSIS — S61412D Laceration without foreign body of left hand, subsequent encounter: Secondary | ICD-10-CM | POA: Diagnosis not present

## 2022-11-07 DIAGNOSIS — M6281 Muscle weakness (generalized): Secondary | ICD-10-CM | POA: Diagnosis not present

## 2022-11-07 DIAGNOSIS — M25562 Pain in left knee: Secondary | ICD-10-CM | POA: Diagnosis not present

## 2022-11-07 DIAGNOSIS — M25561 Pain in right knee: Secondary | ICD-10-CM | POA: Diagnosis not present

## 2022-11-13 DIAGNOSIS — M25561 Pain in right knee: Secondary | ICD-10-CM | POA: Diagnosis not present

## 2022-11-13 DIAGNOSIS — M25562 Pain in left knee: Secondary | ICD-10-CM | POA: Diagnosis not present

## 2022-11-13 DIAGNOSIS — M6281 Muscle weakness (generalized): Secondary | ICD-10-CM | POA: Diagnosis not present

## 2022-11-19 DIAGNOSIS — Z79899 Other long term (current) drug therapy: Secondary | ICD-10-CM | POA: Diagnosis not present

## 2022-11-19 DIAGNOSIS — E039 Hypothyroidism, unspecified: Secondary | ICD-10-CM | POA: Diagnosis not present

## 2022-11-19 DIAGNOSIS — Z6836 Body mass index (BMI) 36.0-36.9, adult: Secondary | ICD-10-CM | POA: Diagnosis not present

## 2022-11-19 DIAGNOSIS — Z0000 Encounter for general adult medical examination without abnormal findings: Secondary | ICD-10-CM | POA: Diagnosis not present

## 2022-11-19 DIAGNOSIS — Z1331 Encounter for screening for depression: Secondary | ICD-10-CM | POA: Diagnosis not present

## 2022-11-19 DIAGNOSIS — M8589 Other specified disorders of bone density and structure, multiple sites: Secondary | ICD-10-CM | POA: Diagnosis not present

## 2022-11-19 DIAGNOSIS — M8580 Other specified disorders of bone density and structure, unspecified site: Secondary | ICD-10-CM | POA: Diagnosis not present

## 2022-11-19 DIAGNOSIS — I2699 Other pulmonary embolism without acute cor pulmonale: Secondary | ICD-10-CM | POA: Diagnosis not present

## 2022-11-19 DIAGNOSIS — N184 Chronic kidney disease, stage 4 (severe): Secondary | ICD-10-CM | POA: Diagnosis not present

## 2022-11-19 DIAGNOSIS — R300 Dysuria: Secondary | ICD-10-CM | POA: Diagnosis not present

## 2022-11-21 DIAGNOSIS — M25562 Pain in left knee: Secondary | ICD-10-CM | POA: Diagnosis not present

## 2022-11-21 DIAGNOSIS — M6281 Muscle weakness (generalized): Secondary | ICD-10-CM | POA: Diagnosis not present

## 2022-11-21 DIAGNOSIS — M25561 Pain in right knee: Secondary | ICD-10-CM | POA: Diagnosis not present

## 2022-11-24 DIAGNOSIS — R519 Headache, unspecified: Secondary | ICD-10-CM | POA: Diagnosis not present

## 2022-12-29 DIAGNOSIS — M7989 Other specified soft tissue disorders: Secondary | ICD-10-CM | POA: Diagnosis not present

## 2022-12-29 DIAGNOSIS — Z79899 Other long term (current) drug therapy: Secondary | ICD-10-CM | POA: Diagnosis not present

## 2022-12-29 DIAGNOSIS — M79662 Pain in left lower leg: Secondary | ICD-10-CM | POA: Diagnosis not present

## 2022-12-29 DIAGNOSIS — Z6838 Body mass index (BMI) 38.0-38.9, adult: Secondary | ICD-10-CM | POA: Diagnosis not present

## 2022-12-30 DIAGNOSIS — M79605 Pain in left leg: Secondary | ICD-10-CM | POA: Diagnosis not present

## 2022-12-30 DIAGNOSIS — E7800 Pure hypercholesterolemia, unspecified: Secondary | ICD-10-CM | POA: Diagnosis not present

## 2022-12-30 DIAGNOSIS — E039 Hypothyroidism, unspecified: Secondary | ICD-10-CM | POA: Diagnosis not present

## 2022-12-30 DIAGNOSIS — R079 Chest pain, unspecified: Secondary | ICD-10-CM | POA: Diagnosis not present

## 2022-12-30 DIAGNOSIS — I129 Hypertensive chronic kidney disease with stage 1 through stage 4 chronic kidney disease, or unspecified chronic kidney disease: Secondary | ICD-10-CM | POA: Diagnosis not present

## 2022-12-30 DIAGNOSIS — R0602 Shortness of breath: Secondary | ICD-10-CM | POA: Diagnosis not present

## 2022-12-30 DIAGNOSIS — Z7901 Long term (current) use of anticoagulants: Secondary | ICD-10-CM | POA: Diagnosis not present

## 2022-12-30 DIAGNOSIS — I639 Cerebral infarction, unspecified: Secondary | ICD-10-CM | POA: Diagnosis not present

## 2022-12-30 DIAGNOSIS — I10 Essential (primary) hypertension: Secondary | ICD-10-CM | POA: Diagnosis not present

## 2022-12-30 DIAGNOSIS — E785 Hyperlipidemia, unspecified: Secondary | ICD-10-CM | POA: Diagnosis not present

## 2022-12-30 DIAGNOSIS — Z86718 Personal history of other venous thrombosis and embolism: Secondary | ICD-10-CM | POA: Diagnosis not present

## 2022-12-30 DIAGNOSIS — I700 Atherosclerosis of aorta: Secondary | ICD-10-CM | POA: Diagnosis not present

## 2022-12-30 DIAGNOSIS — R001 Bradycardia, unspecified: Secondary | ICD-10-CM | POA: Diagnosis not present

## 2022-12-30 DIAGNOSIS — N184 Chronic kidney disease, stage 4 (severe): Secondary | ICD-10-CM | POA: Diagnosis not present

## 2022-12-30 DIAGNOSIS — M7989 Other specified soft tissue disorders: Secondary | ICD-10-CM | POA: Diagnosis not present

## 2022-12-30 DIAGNOSIS — D6862 Lupus anticoagulant syndrome: Secondary | ICD-10-CM | POA: Diagnosis not present

## 2022-12-31 DIAGNOSIS — E039 Hypothyroidism, unspecified: Secondary | ICD-10-CM | POA: Diagnosis not present

## 2022-12-31 DIAGNOSIS — R0602 Shortness of breath: Secondary | ICD-10-CM | POA: Diagnosis not present

## 2022-12-31 DIAGNOSIS — N184 Chronic kidney disease, stage 4 (severe): Secondary | ICD-10-CM | POA: Diagnosis not present

## 2023-01-09 DIAGNOSIS — M1711 Unilateral primary osteoarthritis, right knee: Secondary | ICD-10-CM | POA: Diagnosis not present

## 2023-01-09 DIAGNOSIS — M1712 Unilateral primary osteoarthritis, left knee: Secondary | ICD-10-CM | POA: Diagnosis not present

## 2023-01-09 DIAGNOSIS — Z96651 Presence of right artificial knee joint: Secondary | ICD-10-CM | POA: Diagnosis not present

## 2023-01-14 DIAGNOSIS — N6310 Unspecified lump in the right breast, unspecified quadrant: Secondary | ICD-10-CM | POA: Diagnosis not present

## 2023-01-14 DIAGNOSIS — B3731 Acute candidiasis of vulva and vagina: Secondary | ICD-10-CM | POA: Diagnosis not present

## 2023-01-14 DIAGNOSIS — Z6838 Body mass index (BMI) 38.0-38.9, adult: Secondary | ICD-10-CM | POA: Diagnosis not present

## 2023-01-14 DIAGNOSIS — Z09 Encounter for follow-up examination after completed treatment for conditions other than malignant neoplasm: Secondary | ICD-10-CM | POA: Diagnosis not present

## 2023-01-21 DIAGNOSIS — N6313 Unspecified lump in the right breast, lower outer quadrant: Secondary | ICD-10-CM | POA: Diagnosis not present

## 2023-01-21 DIAGNOSIS — R92313 Mammographic fatty tissue density, bilateral breasts: Secondary | ICD-10-CM | POA: Diagnosis not present

## 2023-01-21 DIAGNOSIS — N644 Mastodynia: Secondary | ICD-10-CM | POA: Diagnosis not present

## 2023-02-09 DIAGNOSIS — N390 Urinary tract infection, site not specified: Secondary | ICD-10-CM | POA: Diagnosis not present

## 2023-02-09 DIAGNOSIS — Z6838 Body mass index (BMI) 38.0-38.9, adult: Secondary | ICD-10-CM | POA: Diagnosis not present

## 2023-02-10 DIAGNOSIS — N390 Urinary tract infection, site not specified: Secondary | ICD-10-CM | POA: Diagnosis not present

## 2023-03-01 DIAGNOSIS — R079 Chest pain, unspecified: Secondary | ICD-10-CM | POA: Diagnosis not present

## 2023-03-01 DIAGNOSIS — Z8673 Personal history of transient ischemic attack (TIA), and cerebral infarction without residual deficits: Secondary | ICD-10-CM | POA: Diagnosis not present

## 2023-03-01 DIAGNOSIS — N185 Chronic kidney disease, stage 5: Secondary | ICD-10-CM | POA: Diagnosis not present

## 2023-03-01 DIAGNOSIS — R001 Bradycardia, unspecified: Secondary | ICD-10-CM | POA: Diagnosis not present

## 2023-03-01 DIAGNOSIS — I361 Nonrheumatic tricuspid (valve) insufficiency: Secondary | ICD-10-CM | POA: Diagnosis not present

## 2023-03-01 DIAGNOSIS — I509 Heart failure, unspecified: Secondary | ICD-10-CM | POA: Diagnosis not present

## 2023-03-01 DIAGNOSIS — Z6841 Body Mass Index (BMI) 40.0 and over, adult: Secondary | ICD-10-CM | POA: Diagnosis not present

## 2023-03-01 DIAGNOSIS — F329 Major depressive disorder, single episode, unspecified: Secondary | ICD-10-CM | POA: Diagnosis not present

## 2023-03-01 DIAGNOSIS — Z86711 Personal history of pulmonary embolism: Secondary | ICD-10-CM | POA: Diagnosis not present

## 2023-03-01 DIAGNOSIS — I10 Essential (primary) hypertension: Secondary | ICD-10-CM | POA: Diagnosis not present

## 2023-03-01 DIAGNOSIS — K219 Gastro-esophageal reflux disease without esophagitis: Secondary | ICD-10-CM | POA: Diagnosis not present

## 2023-03-01 DIAGNOSIS — Z86718 Personal history of other venous thrombosis and embolism: Secondary | ICD-10-CM | POA: Diagnosis not present

## 2023-03-01 DIAGNOSIS — I959 Hypotension, unspecified: Secondary | ICD-10-CM | POA: Diagnosis not present

## 2023-03-02 DIAGNOSIS — R079 Chest pain, unspecified: Secondary | ICD-10-CM | POA: Diagnosis not present

## 2023-03-02 DIAGNOSIS — I10 Essential (primary) hypertension: Secondary | ICD-10-CM | POA: Diagnosis not present

## 2023-03-02 DIAGNOSIS — N185 Chronic kidney disease, stage 5: Secondary | ICD-10-CM | POA: Diagnosis not present

## 2023-03-02 DIAGNOSIS — F329 Major depressive disorder, single episode, unspecified: Secondary | ICD-10-CM | POA: Diagnosis not present

## 2023-03-02 DIAGNOSIS — I361 Nonrheumatic tricuspid (valve) insufficiency: Secondary | ICD-10-CM | POA: Diagnosis not present

## 2023-03-02 DIAGNOSIS — R001 Bradycardia, unspecified: Secondary | ICD-10-CM | POA: Diagnosis not present

## 2023-03-02 DIAGNOSIS — I509 Heart failure, unspecified: Secondary | ICD-10-CM | POA: Diagnosis not present

## 2023-03-11 DIAGNOSIS — N184 Chronic kidney disease, stage 4 (severe): Secondary | ICD-10-CM | POA: Diagnosis not present

## 2023-03-11 DIAGNOSIS — B3731 Acute candidiasis of vulva and vagina: Secondary | ICD-10-CM | POA: Diagnosis not present

## 2023-03-11 DIAGNOSIS — Z6839 Body mass index (BMI) 39.0-39.9, adult: Secondary | ICD-10-CM | POA: Diagnosis not present

## 2023-06-12 DIAGNOSIS — D225 Melanocytic nevi of trunk: Secondary | ICD-10-CM | POA: Diagnosis not present

## 2023-06-12 DIAGNOSIS — D485 Neoplasm of uncertain behavior of skin: Secondary | ICD-10-CM | POA: Diagnosis not present

## 2023-07-03 DIAGNOSIS — C44319 Basal cell carcinoma of skin of other parts of face: Secondary | ICD-10-CM | POA: Diagnosis not present

## 2023-07-14 DIAGNOSIS — B3731 Acute candidiasis of vulva and vagina: Secondary | ICD-10-CM | POA: Diagnosis not present

## 2023-07-14 DIAGNOSIS — N184 Chronic kidney disease, stage 4 (severe): Secondary | ICD-10-CM | POA: Diagnosis not present

## 2023-07-14 DIAGNOSIS — Z6839 Body mass index (BMI) 39.0-39.9, adult: Secondary | ICD-10-CM | POA: Diagnosis not present

## 2023-07-14 DIAGNOSIS — R319 Hematuria, unspecified: Secondary | ICD-10-CM | POA: Diagnosis not present

## 2023-08-10 DIAGNOSIS — C44319 Basal cell carcinoma of skin of other parts of face: Secondary | ICD-10-CM | POA: Diagnosis not present

## 2023-08-14 DIAGNOSIS — D649 Anemia, unspecified: Secondary | ICD-10-CM | POA: Diagnosis not present

## 2023-08-14 DIAGNOSIS — Z6839 Body mass index (BMI) 39.0-39.9, adult: Secondary | ICD-10-CM | POA: Diagnosis not present

## 2023-08-14 DIAGNOSIS — E039 Hypothyroidism, unspecified: Secondary | ICD-10-CM | POA: Diagnosis not present

## 2023-08-14 DIAGNOSIS — D519 Vitamin B12 deficiency anemia, unspecified: Secondary | ICD-10-CM | POA: Diagnosis not present

## 2023-08-14 DIAGNOSIS — R7989 Other specified abnormal findings of blood chemistry: Secondary | ICD-10-CM | POA: Diagnosis not present

## 2023-08-14 DIAGNOSIS — J3489 Other specified disorders of nose and nasal sinuses: Secondary | ICD-10-CM | POA: Diagnosis not present

## 2023-08-19 DIAGNOSIS — H2511 Age-related nuclear cataract, right eye: Secondary | ICD-10-CM | POA: Diagnosis not present

## 2023-08-19 DIAGNOSIS — H43813 Vitreous degeneration, bilateral: Secondary | ICD-10-CM | POA: Diagnosis not present

## 2023-08-19 DIAGNOSIS — E538 Deficiency of other specified B group vitamins: Secondary | ICD-10-CM | POA: Diagnosis not present

## 2023-08-19 DIAGNOSIS — H524 Presbyopia: Secondary | ICD-10-CM | POA: Diagnosis not present

## 2023-08-27 DIAGNOSIS — M170 Bilateral primary osteoarthritis of knee: Secondary | ICD-10-CM | POA: Diagnosis not present

## 2023-08-27 DIAGNOSIS — Z96651 Presence of right artificial knee joint: Secondary | ICD-10-CM | POA: Diagnosis not present

## 2023-08-27 DIAGNOSIS — M1711 Unilateral primary osteoarthritis, right knee: Secondary | ICD-10-CM | POA: Diagnosis not present

## 2023-08-27 DIAGNOSIS — D519 Vitamin B12 deficiency anemia, unspecified: Secondary | ICD-10-CM | POA: Diagnosis not present

## 2023-08-27 DIAGNOSIS — Z96652 Presence of left artificial knee joint: Secondary | ICD-10-CM | POA: Diagnosis not present

## 2023-08-27 DIAGNOSIS — M1712 Unilateral primary osteoarthritis, left knee: Secondary | ICD-10-CM | POA: Diagnosis not present

## 2023-09-04 DIAGNOSIS — R300 Dysuria: Secondary | ICD-10-CM | POA: Diagnosis not present

## 2023-09-04 DIAGNOSIS — E538 Deficiency of other specified B group vitamins: Secondary | ICD-10-CM | POA: Diagnosis not present

## 2023-09-04 LAB — LAB REPORT - SCANNED
EGFR: 28
Free T4: 1.23 ng/dL
TSH: 1.5 (ref 0.41–5.90)

## 2023-09-10 DIAGNOSIS — E538 Deficiency of other specified B group vitamins: Secondary | ICD-10-CM | POA: Diagnosis not present

## 2023-09-22 DIAGNOSIS — M25562 Pain in left knee: Secondary | ICD-10-CM | POA: Diagnosis not present

## 2023-09-23 DIAGNOSIS — Z96652 Presence of left artificial knee joint: Secondary | ICD-10-CM | POA: Diagnosis not present

## 2023-09-23 DIAGNOSIS — R600 Localized edema: Secondary | ICD-10-CM | POA: Diagnosis not present

## 2023-09-29 DIAGNOSIS — M1712 Unilateral primary osteoarthritis, left knee: Secondary | ICD-10-CM | POA: Diagnosis not present

## 2023-09-29 DIAGNOSIS — Z96651 Presence of right artificial knee joint: Secondary | ICD-10-CM | POA: Diagnosis not present

## 2023-09-29 DIAGNOSIS — Z96652 Presence of left artificial knee joint: Secondary | ICD-10-CM | POA: Diagnosis not present

## 2023-09-29 DIAGNOSIS — M1711 Unilateral primary osteoarthritis, right knee: Secondary | ICD-10-CM | POA: Diagnosis not present

## 2023-09-29 DIAGNOSIS — Z79899 Other long term (current) drug therapy: Secondary | ICD-10-CM | POA: Diagnosis not present

## 2023-10-14 DIAGNOSIS — M1711 Unilateral primary osteoarthritis, right knee: Secondary | ICD-10-CM | POA: Diagnosis not present

## 2023-10-15 DIAGNOSIS — M1711 Unilateral primary osteoarthritis, right knee: Secondary | ICD-10-CM | POA: Diagnosis not present

## 2023-10-23 DIAGNOSIS — E538 Deficiency of other specified B group vitamins: Secondary | ICD-10-CM | POA: Diagnosis not present

## 2023-11-03 DIAGNOSIS — R079 Chest pain, unspecified: Secondary | ICD-10-CM | POA: Diagnosis not present

## 2023-11-03 DIAGNOSIS — J329 Chronic sinusitis, unspecified: Secondary | ICD-10-CM | POA: Diagnosis not present

## 2023-11-04 ENCOUNTER — Other Ambulatory Visit: Payer: Self-pay

## 2023-11-04 DIAGNOSIS — N189 Chronic kidney disease, unspecified: Secondary | ICD-10-CM | POA: Insufficient documentation

## 2023-11-04 DIAGNOSIS — I639 Cerebral infarction, unspecified: Secondary | ICD-10-CM | POA: Insufficient documentation

## 2023-11-04 DIAGNOSIS — R0600 Dyspnea, unspecified: Secondary | ICD-10-CM | POA: Insufficient documentation

## 2023-11-04 DIAGNOSIS — Z6839 Body mass index (BMI) 39.0-39.9, adult: Secondary | ICD-10-CM | POA: Insufficient documentation

## 2023-11-04 DIAGNOSIS — R079 Chest pain, unspecified: Secondary | ICD-10-CM | POA: Insufficient documentation

## 2023-11-04 DIAGNOSIS — I509 Heart failure, unspecified: Secondary | ICD-10-CM | POA: Insufficient documentation

## 2023-11-05 ENCOUNTER — Ambulatory Visit: Payer: Medicare HMO | Admitting: Cardiology

## 2023-11-05 ENCOUNTER — Encounter: Payer: Self-pay | Admitting: Cardiology

## 2023-11-05 ENCOUNTER — Ambulatory Visit: Payer: 59 | Attending: Cardiology | Admitting: Cardiology

## 2023-11-05 VITALS — BP 138/88 | HR 66 | Ht 62.6 in | Wt 217.0 lb

## 2023-11-05 DIAGNOSIS — Z96652 Presence of left artificial knee joint: Secondary | ICD-10-CM | POA: Diagnosis not present

## 2023-11-05 DIAGNOSIS — R079 Chest pain, unspecified: Secondary | ICD-10-CM

## 2023-11-05 DIAGNOSIS — Z96651 Presence of right artificial knee joint: Secondary | ICD-10-CM | POA: Diagnosis not present

## 2023-11-05 DIAGNOSIS — M1712 Unilateral primary osteoarthritis, left knee: Secondary | ICD-10-CM | POA: Diagnosis not present

## 2023-11-05 DIAGNOSIS — M1711 Unilateral primary osteoarthritis, right knee: Secondary | ICD-10-CM | POA: Diagnosis not present

## 2023-11-05 MED ORDER — NITROGLYCERIN 0.4 MG SL SUBL: 0.4000 mg | SUBLINGUAL_TABLET | SUBLINGUAL | 6 refills | Status: DC | PRN

## 2023-11-05 MED ORDER — ISOSORBIDE MONONITRATE ER 30 MG PO TB24: 30.0000 mg | ORAL_TABLET | Freq: Every day | ORAL | 2 refills | Status: DC

## 2023-11-05 NOTE — Progress Notes (Signed)
 Cardiology Office Note:    Date:  11/05/2023   ID:  Rachael [name], DOB Jan 20, 1956, MRN [id number]  PCP:  Marylen Ponto, MD  Cardiologist:  Garwin Brothers, MD   Referring MD: [address] Stephanie Coup, *    ASSESSMENT:    1. Chest pain at rest    PLAN:    In order of problems listed above:  Primary prevention stressed with the patient.  Importance of compliance with diet medication stressed and patient verbalized standing. Chest pain: Atypical in nature.  Had a stress test about 8 months ago which was unremarkable.  I reassured her.  I would most certainly not send her for a CT coronary angiography because of high dye burden and dye load and the risk of renal failure.  I discussed coronary angiography and left heart catheterization.  Benefits risks explained.  She is vehemently against it in view of renal risks.  I respect her wishes.  I will initiate her on isosorbide mononitrate low-dose.  She will keep a log of pulse blood pressure when she comes for blood work. Mixed dyslipidemia: On lipid-lowering medications.  Goal LDL must be less than 70.  She will come back for blood work in the next few days fasting. History of pulmonary embolism and lupus anticoagulant on anticoagulation followed by primary care. Echocardiogram done at [facility] was unremarkable in the recent past. Morbid obesity: Weight reduction stressed diet emphasized and she promises to do better. Calcium scoring CT scan will be done to assess burden of calcium and possibly plaque in view of her limitations in performing a full-fledged angiography.  This will help me assess her risk send further modify lipid-lowering and such. Patient will be seen in follow-up appointment in 4 weeks or earlier if the patient has any concerns.    Medication Adjustments/Labs and Tests Ordered: Current medicines are reviewed at length with the patient today.  Concerns regarding medicines are outlined above.  Orders Placed This  Encounter  Procedures   EKG 12-Lead   No orders of the defined types were placed in this encounter.    History of Present Illness:    Rachael [name] is a 68 y.o. female who is being seen today for the evaluation of chest pain at the request of Street, Stephanie Coup, *.  Patient has past medical history of mixed dyslipidemia, morbid obesity, history of lupus anticoagulant for which she is on anticoagulation, history of PE.  She mentions to me that she occasionally has chest discomfort.  She does not exert much because of the aforementioned issues.  She was on dialysis in the past but currently off it.  Has renal function is significantly low.  At the time of my evaluation, the patient is alert awake oriented and in no distress.  Past Medical History:  Diagnosis Date    RV dilation by echocardiogram in setting of PE 01/23/2017   Acute deep vein thrombosis (DVT) of both lower extremities (HCC)    Acute renal failure superimposed on stage 5 chronic kidney disease, not on chronic dialysis (HCC) 09/22/2016   Acute respiratory failure with hypoxia (HCC)    AKI (acute kidney injury) (HCC) 09/22/2016   Bacteremia 10/23/2016   BMI 39.0-39.9,adult    Chest pain at rest    CHF (congestive heart failure) (HCC)    Heart cath in 2006   CHF exacerbation (HCC) 01/16/2017   Chronic kidney disease (CKD), stage V (HCC) 10/23/2016   CKD (chronic kidney disease)  Baseline creat 1.4-1.7   CKD (chronic kidney disease), stage IV (HCC) 01/16/2017   Community acquired pneumonia    CVA (cerebral vascular accident) (HCC)    No residual deficit   Depression 10/24/2016   Dyspnea    Elevated troponin 01/16/2017   Hemodialysis catheter infection (HCC)    History of CVA (cerebrovascular accident) 10/24/2016   HTN (hypertension)    Hyperlipidemia 10/24/2016   Hypotension 01/21/2017   Hypothyroidism    Interstitial nephritis    Lupus anticoagulant positive 08/07/2021   Nausea and vomiting 09/22/2016    Orthostasis 01/23/2017   Paroxysmal atrial fibrillation (HCC) 10/30/2016   Pruritus 05/16/2015   Pulmonary embolus (HCC)    Sepsis (HCC) 01/16/2017   SOB (shortness of breath)    Supratherapeutic INR 01/25/2017   Urticaria 05/16/2015   UTI (urinary tract infection) 09/22/2016    Past Surgical History:  Procedure Laterality Date   CARDIAC CATHETERIZATION  2006   CHOLECYSTECTOMY     IR GENERIC HISTORICAL  09/29/2016   IR FLUORO GUIDE CV LINE RIGHT 09/29/2016 Malachy Moan, MD [facility]   IR GENERIC HISTORICAL  09/29/2016   IR US GUIDE VASC ACCESS RIGHT [facility]   TEE WITHOUT CARDIOVERSION N/A 10/27/2016   Procedure: TRANSESOPHAGEAL ECHOCARDIOGRAM (TEE);  Surgeon: Lars Masson, MD;  Location: [facility] ENDOSCOPY;  Service: Cardiovascular;  Laterality: N/A;    Current Medications: Current Meds  Medication Sig   citalopram (CELEXA) 40 MG tablet Take 40 mg by mouth daily.   doxycycline (VIBRA-TABS) 100 MG tablet Take 100 mg by mouth 2 (two) times daily.   famotidine (PEPCID) 20 MG tablet Take 20 mg by mouth daily.   fluconazole (DIFLUCAN) 150 MG tablet Take 150 mg by mouth every 3 (three) days.   levothyroxine (SYNTHROID) 100 MCG tablet Take 100 mcg by mouth daily.   predniSONE (DELTASONE) 20 MG tablet Take 20 mg by mouth 2 (two) times daily.   rosuvastatin (CRESTOR) 5 MG tablet Take 5 mg by mouth daily.   topiramate (TOPAMAX) 25 MG tablet Take 25 mg by mouth daily.   XARELTO 15 MG TABS tablet Take 15 mg by mouth daily.     Allergies:   Lovenox [enoxaparin], Pantoprazole, Proton pump inhibitors, Penicillins, and Sulfa antibiotics   Social History   Socioeconomic History   Marital status: Single    Spouse name: Not on file   Number of children: Not on file   Years of education: Not on file   Highest education level: Not on file  Occupational History   Not on file  Tobacco Use   Smoking status: Never   Smokeless tobacco: Never  Vaping Use   Vaping status: Never Used   Substance and Sexual Activity   Alcohol use: No   Drug use: Never   Sexual activity: Not Currently  Other Topics Concern   Not on file  Social History Narrative   Not on file   Social Drivers of Health   Financial Resource Strain: Not on file  Food Insecurity: Not on file  Transportation Needs: Not on file  Physical Activity: Not on file  Stress: Not on file  Social Connections: Unknown (01/17/2022)   Received from [facility]   Social Network    Social Network: Not on file     Family History: The patient's family history includes Coronary artery disease in her father and mother; Hypertension in her brother, father, mother, and sister; Stroke in her mother.  ROS:   Please see the  history of present illness.    All other systems reviewed and are negative.  EKGs/Labs/Other Studies Reviewed:    The following studies were reviewed today:  EKG Interpretation Date/Time:  Thursday November 05 2023 14:30:48 EST Ventricular Rate:  66 PR Interval:  164 QRS Duration:  80 QT Interval:  432 QTC Calculation: 452 R Axis:   2  Text Interpretation: Normal sinus rhythm Cannot rule out Anterior infarct , age undetermined Abnormal ECG When compared with ECG of 21-Jun-2020 02:24, PREVIOUS ECG IS PRESENT Confirmed by Belva Crome [phone]) on 11/05/2023 2:37:33 PM     Recent Labs: No results found for requested labs within last 365 days.  Recent Lipid Panel No results found for: "CHOL", "TRIG", "HDL", "CHOLHDL", "VLDL", "LDLCALC", "LDLDIRECT"  Physical Exam:    VS:  BP 138/88   Pulse 66   Ht 5' 2.6" (1.59 m)   Wt 217 lb (98.4 kg)   SpO2 97%   BMI 38.93 kg/m     Wt Readings from Last 3 Encounters:  11/05/23 217 lb (98.4 kg)  12/27/21 202 lb 8 oz (91.9 kg)  07/30/21 203 lb 14.4 oz (92.5 kg)     GEN: Patient is in no acute distress HEENT: Normal NECK: No JVD; No carotid bruits LYMPHATICS: No lymphadenopathy CARDIAC: S1 S2 regular, 2/6 systolic murmur at  the apex. RESPIRATORY:  Clear to auscultation without rales, wheezing or rhonchi  ABDOMEN: Soft, non-tender, non-distended MUSCULOSKELETAL:  No edema; No deformity  SKIN: Warm and dry NEUROLOGIC:  Alert and oriented x 3 PSYCHIATRIC:  Normal affect    Signed, Garwin Brothers, MD  11/05/2023 2:38 PM    [facility]

## 2023-11-05 NOTE — Patient Instructions (Addendum)
 Medication Instructions:    Use nitroglycerin 1 tablet placed under the tongue at the first sign of chest pain or an angina attack. 1 tablet may be used every 5 minutes as needed, for up to 15 minutes. Do not take more than 3 tablets in 15 minutes. If pain persist call 911 or go to the nearest ED.  Start  Imdur 30 mg once a day.  *If you need a refill on your cardiac medications before your next appointment, please call your pharmacy*   Lab Work: Your physician recommends that you return for lab work in: next few days You need to have labs done when you are fasting.  You can come Monday through Friday 8:30 am to 12:00 pm and 1:15 to 4:30. You do not need to make an appointment as the order has already been placed. The labs you are going to have done are BMET,  LFT and Lipids.  If you have labs (blood work) drawn today and your tests are completely normal, you will receive your results only by: MyChart Message (if you have MyChart) OR A paper copy in the mail If you have any lab test that is abnormal or we need to change your treatment, we will call you to review the results.   Testing/Procedures: We will order CT coronary calcium score. It will cost $99.00 and iis due at time of scan.  Please call to schedule.     [facility] [address], [place], [place] [address] [phone]    Follow-Up: At [facility], you and your health needs are our priority.  As part of our continuing mission to provide you with exceptional heart care, we have created designated Provider Care Teams.  These Care Teams include your primary Cardiologist (physician) and Advanced Practice Providers (APPs -  Physician Assistants and Nurse Practitioners) who all work together to provide you with the care you need, when you need it.  We recommend signing up for the patient portal called "MyChart".  Sign up information is provided on this After Visit Summary.  MyChart is used to connect with patients for  Virtual Visits (Telemedicine).  Patients are able to view lab/test results, encounter notes, upcoming appointments, etc.  Non-urgent messages can be sent to your provider as well.   To learn more about what you can do with MyChart, go to [email or web address].    Your next appointment:   3 week follow up

## 2023-11-17 DIAGNOSIS — I509 Heart failure, unspecified: Secondary | ICD-10-CM | POA: Diagnosis not present

## 2023-11-17 DIAGNOSIS — R079 Chest pain, unspecified: Secondary | ICD-10-CM | POA: Diagnosis not present

## 2023-11-17 DIAGNOSIS — Z79899 Other long term (current) drug therapy: Secondary | ICD-10-CM | POA: Diagnosis not present

## 2023-11-17 DIAGNOSIS — Z7901 Long term (current) use of anticoagulants: Secondary | ICD-10-CM | POA: Diagnosis not present

## 2023-11-17 DIAGNOSIS — N185 Chronic kidney disease, stage 5: Secondary | ICD-10-CM | POA: Diagnosis not present

## 2023-11-17 DIAGNOSIS — Z23 Encounter for immunization: Secondary | ICD-10-CM | POA: Diagnosis not present

## 2023-11-17 DIAGNOSIS — K219 Gastro-esophageal reflux disease without esophagitis: Secondary | ICD-10-CM | POA: Diagnosis not present

## 2023-11-17 DIAGNOSIS — Z86711 Personal history of pulmonary embolism: Secondary | ICD-10-CM | POA: Diagnosis not present

## 2023-11-17 DIAGNOSIS — E7800 Pure hypercholesterolemia, unspecified: Secondary | ICD-10-CM | POA: Diagnosis not present

## 2023-11-17 DIAGNOSIS — Z880 Allergy status to penicillin: Secondary | ICD-10-CM | POA: Diagnosis not present

## 2023-11-17 DIAGNOSIS — Z1152 Encounter for screening for COVID-19: Secondary | ICD-10-CM | POA: Diagnosis not present

## 2023-11-17 DIAGNOSIS — Z882 Allergy status to sulfonamides status: Secondary | ICD-10-CM | POA: Diagnosis not present

## 2023-11-17 DIAGNOSIS — J811 Chronic pulmonary edema: Secondary | ICD-10-CM | POA: Diagnosis not present

## 2023-11-17 DIAGNOSIS — J9811 Atelectasis: Secondary | ICD-10-CM | POA: Diagnosis not present

## 2023-11-18 ENCOUNTER — Other Ambulatory Visit (HOSPITAL_BASED_OUTPATIENT_CLINIC_OR_DEPARTMENT_OTHER): Admitting: Radiology

## 2023-11-18 DIAGNOSIS — N185 Chronic kidney disease, stage 5: Secondary | ICD-10-CM | POA: Diagnosis not present

## 2023-11-18 DIAGNOSIS — R079 Chest pain, unspecified: Secondary | ICD-10-CM | POA: Diagnosis not present

## 2023-11-18 DIAGNOSIS — I509 Heart failure, unspecified: Secondary | ICD-10-CM | POA: Diagnosis not present

## 2023-11-19 DIAGNOSIS — R079 Chest pain, unspecified: Secondary | ICD-10-CM | POA: Diagnosis not present

## 2023-11-19 DIAGNOSIS — N185 Chronic kidney disease, stage 5: Secondary | ICD-10-CM | POA: Diagnosis not present

## 2023-11-19 DIAGNOSIS — I509 Heart failure, unspecified: Secondary | ICD-10-CM | POA: Diagnosis not present

## 2023-11-27 DIAGNOSIS — E538 Deficiency of other specified B group vitamins: Secondary | ICD-10-CM | POA: Diagnosis not present

## 2023-12-02 ENCOUNTER — Encounter: Payer: Self-pay | Admitting: Cardiology

## 2023-12-02 ENCOUNTER — Ambulatory Visit: Payer: 59 | Attending: Cardiology | Admitting: Cardiology

## 2023-12-02 VITALS — BP 120/74 | HR 80 | Ht 62.6 in | Wt 215.2 lb

## 2023-12-02 DIAGNOSIS — Z6839 Body mass index (BMI) 39.0-39.9, adult: Secondary | ICD-10-CM | POA: Diagnosis not present

## 2023-12-02 DIAGNOSIS — Z8673 Personal history of transient ischemic attack (TIA), and cerebral infarction without residual deficits: Secondary | ICD-10-CM

## 2023-12-02 DIAGNOSIS — N189 Chronic kidney disease, unspecified: Secondary | ICD-10-CM | POA: Diagnosis not present

## 2023-12-02 DIAGNOSIS — Z86711 Personal history of pulmonary embolism: Secondary | ICD-10-CM | POA: Insufficient documentation

## 2023-12-02 MED ORDER — ISOSORBIDE MONONITRATE ER 30 MG PO TB24
30.0000 mg | ORAL_TABLET | Freq: Every day | ORAL | 3 refills | Status: AC
Start: 2023-12-02 — End: ?

## 2023-12-02 MED ORDER — NITROGLYCERIN 0.4 MG SL SUBL: 0.4000 mg | SUBLINGUAL_TABLET | SUBLINGUAL | 11 refills | Status: AC | PRN

## 2023-12-02 NOTE — Addendum Note (Signed)
 Addended by: Mayer Camel L on: 12/02/2023 02:43 PM   Modules accepted: Orders

## 2023-12-02 NOTE — Progress Notes (Signed)
 Cardiology Office Note:    Date:  12/02/2023   ID:  Margarito Liner Cheever, DOB 09-16-1955, MRN [id number]  PCP:  Marylen Ponto, MD  Cardiologist:  Garwin Brothers, MD   Referring MD: Marylen Ponto, MD    ASSESSMENT:    1. Chronic kidney disease, unspecified CKD stage   2. History of CVA (cerebrovascular accident)   3. BMI 39.0-39.9,adult   4. History of pulmonary embolism    PLAN:    In order of problems listed above:  Primary prevention stressed with the patient.  Importance of compliance with diet medication stressed and patient verbalized standing. She was advised to walk at least half an hour a day on a daily basis and she promises to do so. Chest pain: This is resolved.  Stress test report and echo report discussed with her. History of PE on anticoagulation followed by primary care. Renal insufficiency: Stable.  Managed by primary care. Mixed dyslipidemia and obesity: Weight reduction stressed diet emphasized.  Lipids reviewed and LDL is at goal I discussed this with her at length.  Risks of obesity explained and she promises to do better. Patient will be seen in follow-up appointment in 6 months or earlier if the patient has any concerns.    Medication Adjustments/Labs and Tests Ordered: Current medicines are reviewed at length with the patient today.  Concerns regarding medicines are outlined above.  No orders of the defined types were placed in this encounter.  No orders of the defined types were placed in this encounter.    No chief complaint on file.    History of Present Illness:    Rachael [name] is a 68 y.o. female.  Patient has past medical history of pulmonary embolism, lupus anticoagulant, renal insufficiency, essential hypertension and chest pain.  She has history of mixed dyslipidemia.  She was admitted to the hospital with chest pain.  A PE was ruled out, echocardiogram and stress test was unremarkable.  I reviewed those records extensively.  She currently  denies any chest pain orthopnea or PND.  She is very happy about this.  At the time of my evaluation, the patient is alert awake oriented and in no distress.  Past Medical History:  Diagnosis Date    RV dilation by echocardiogram in setting of PE 01/23/2017   Acute deep vein thrombosis (DVT) of both lower extremities (HCC)    Acute renal failure superimposed on stage 5 chronic kidney disease, not on chronic dialysis (HCC) 09/22/2016   Acute respiratory failure with hypoxia (HCC)    AKI (acute kidney injury) (HCC) 09/22/2016   Bacteremia 10/23/2016   BMI 39.0-39.9,adult    Chest pain at rest    CHF (congestive heart failure) (HCC)    Heart cath in 2006   CHF exacerbation (HCC) 01/16/2017   Chronic kidney disease (CKD), stage V (HCC) 10/23/2016   CKD (chronic kidney disease)    Baseline creat 1.4-1.7   CKD (chronic kidney disease), stage IV (HCC) 01/16/2017   Community acquired pneumonia    CVA (cerebral vascular accident) (HCC)    No residual deficit   Depression 10/24/2016   Dyspnea    Elevated troponin 01/16/2017   Hemodialysis catheter infection (HCC)    History of CVA (cerebrovascular accident) 10/24/2016   HTN (hypertension)    Hyperlipidemia 10/24/2016   Hypotension 01/21/2017   Hypothyroidism    Interstitial nephritis    Lupus anticoagulant positive 08/07/2021   Nausea and vomiting 09/22/2016   Orthostasis 01/23/2017  Paroxysmal atrial fibrillation (HCC) 10/30/2016   Pruritus 05/16/2015   Pulmonary embolus (HCC)    Sepsis (HCC) 01/16/2017   SOB (shortness of breath)    Supratherapeutic INR 01/25/2017   Urticaria 05/16/2015   UTI (urinary tract infection) 09/22/2016    Past Surgical History:  Procedure Laterality Date   CARDIAC CATHETERIZATION  2006   CHOLECYSTECTOMY     IR GENERIC HISTORICAL  09/29/2016   IR FLUORO GUIDE CV LINE RIGHT 09/29/2016 Malachy Moan, MD [facility]   IR GENERIC HISTORICAL  09/29/2016   IR US GUIDE VASC ACCESS RIGHT [facility]    TEE WITHOUT CARDIOVERSION N/A 10/27/2016   Procedure: TRANSESOPHAGEAL ECHOCARDIOGRAM (TEE);  Surgeon: Lars Masson, MD;  Location: [facility] ENDOSCOPY;  Service: Cardiovascular;  Laterality: N/A;    Current Medications: Current Meds  Medication Sig   Ascorbic Acid (VITAMIN C) 1000 MG tablet Take 1,000 mg by mouth daily.   Biotin 1000 MCG tablet Take 1,000 mcg by mouth daily.   Cholecalciferol (VITAMIN D) 50 MCG (2000 UT) CAPS Take 2,000 Units by mouth daily.   citalopram (CELEXA) 40 MG tablet Take 40 mg by mouth daily.   Cyanocobalamin (B-12 COMPLIANCE INJECTION) 1000 MCG/ML KIT Inject 1,000 mcg as directed every 30 (thirty) days.   famotidine (PEPCID) 20 MG tablet Take 20 mg by mouth 2 (two) times daily.   furosemide (LASIX) 40 MG tablet Take 40 mg by mouth daily as needed for fluid or edema.   isosorbide mononitrate (IMDUR) 30 MG 24 hr tablet Take 1 tablet (30 mg total) by mouth daily.   levothyroxine (SYNTHROID) 100 MCG tablet Take 100 mcg by mouth daily.   Melatonin 10 MG TABS Take 10 mg by mouth as needed (sleep).   nitroGLYCERIN (NITROSTAT) 0.4 MG SL tablet Place 1 tablet (0.4 mg total) under the tongue every 5 (five) minutes as needed for chest pain.   rosuvastatin (CRESTOR) 5 MG tablet Take 5 mg by mouth daily.   topiramate (TOPAMAX) 25 MG tablet Take 25 mg by mouth daily.   XARELTO 15 MG TABS tablet Take 15 mg by mouth daily.     Allergies:   Lovenox [enoxaparin], Pantoprazole, Proton pump inhibitors, Penicillins, and Sulfa antibiotics   Social History   Socioeconomic History   Marital status: Single    Spouse name: Not on file   Number of children: Not on file   Years of education: Not on file   Highest education level: Not on file  Occupational History   Not on file  Tobacco Use   Smoking status: Never   Smokeless tobacco: Never  Vaping Use   Vaping status: Never Used  Substance and Sexual Activity   Alcohol use: No   Drug use: Never   Sexual activity: Not  Currently  Other Topics Concern   Not on file  Social History Narrative   Not on file   Social Drivers of Health   Financial Resource Strain: Not on file  Food Insecurity: Not on file  Transportation Needs: Not on file  Physical Activity: Not on file  Stress: Not on file  Social Connections: Unknown (01/17/2022)   [facility]   Social Network    Social Network: Not on file     Family History: The patient's family history includes Coronary artery disease in her father and mother; Hypertension in her brother, father, mother, and sister; Stroke in her mother.  ROS:   Please see the history of present illness.  All other systems reviewed and are negative.  EKGs/Labs/Other Studies Reviewed:    The following studies were reviewed today: I reviewed hospital records and discussed with the patient   Recent Labs: No results found for requested labs within last 365 days.  Recent Lipid Panel No results found for: "CHOL", "TRIG", "HDL", "CHOLHDL", "VLDL", "LDLCALC", "LDLDIRECT"  Physical Exam:    VS:  BP 120/74   Pulse 80   Ht 5' 2.6" (1.59 m)   Wt 215 lb 3.2 oz (97.6 kg)   SpO2 96%   BMI 38.61 kg/m     Wt Readings from Last 3 Encounters:  12/02/23 215 lb 3.2 oz (97.6 kg)  11/05/23 217 lb (98.4 kg)  12/27/21 202 lb 8 oz (91.9 kg)     GEN: Patient is in no acute distress HEENT: Normal NECK: No JVD; No carotid bruits LYMPHATICS: No lymphadenopathy CARDIAC: Hear sounds regular, 2/6 systolic murmur at the apex. RESPIRATORY:  Clear to auscultation without rales, wheezing or rhonchi  ABDOMEN: Soft, non-tender, non-distended MUSCULOSKELETAL:  No edema; No deformity  SKIN: Warm and dry NEUROLOGIC:  Alert and oriented x 3 PSYCHIATRIC:  Normal affect   Signed, Garwin Brothers, MD  12/02/2023 2:38 PM    [facility]

## 2023-12-02 NOTE — Patient Instructions (Signed)

## 2024-01-21 DIAGNOSIS — E539 Vitamin B deficiency, unspecified: Secondary | ICD-10-CM | POA: Diagnosis not present

## 2024-02-11 DIAGNOSIS — M545 Low back pain, unspecified: Secondary | ICD-10-CM | POA: Diagnosis not present

## 2024-02-14 ENCOUNTER — Emergency Department (HOSPITAL_COMMUNITY)

## 2024-02-14 ENCOUNTER — Encounter (HOSPITAL_COMMUNITY): Payer: Self-pay | Admitting: *Deleted

## 2024-02-14 ENCOUNTER — Other Ambulatory Visit: Payer: Self-pay

## 2024-02-14 ENCOUNTER — Observation Stay (HOSPITAL_COMMUNITY)
Admission: EM | Admit: 2024-02-14 | Discharge: 2024-02-16 | Disposition: A | Discharge status: Home / Self Care | Attending: Family Medicine | Admitting: Family Medicine

## 2024-02-14 DIAGNOSIS — E785 Hyperlipidemia, unspecified: Secondary | ICD-10-CM | POA: Diagnosis not present

## 2024-02-14 DIAGNOSIS — R6889 Other general symptoms and signs: Secondary | ICD-10-CM | POA: Diagnosis not present

## 2024-02-14 DIAGNOSIS — I132 Hypertensive heart and chronic kidney disease with heart failure and with stage 5 chronic kidney disease, or end stage renal disease: Secondary | ICD-10-CM | POA: Diagnosis not present

## 2024-02-14 DIAGNOSIS — I10 Essential (primary) hypertension: Secondary | ICD-10-CM | POA: Diagnosis present

## 2024-02-14 DIAGNOSIS — Z86718 Personal history of other venous thrombosis and embolism: Secondary | ICD-10-CM | POA: Insufficient documentation

## 2024-02-14 DIAGNOSIS — R079 Chest pain, unspecified: Principal | ICD-10-CM | POA: Diagnosis present

## 2024-02-14 DIAGNOSIS — N185 Chronic kidney disease, stage 5: Secondary | ICD-10-CM | POA: Insufficient documentation

## 2024-02-14 DIAGNOSIS — N184 Chronic kidney disease, stage 4 (severe): Secondary | ICD-10-CM | POA: Diagnosis present

## 2024-02-14 DIAGNOSIS — R0789 Other chest pain: Secondary | ICD-10-CM | POA: Diagnosis not present

## 2024-02-14 DIAGNOSIS — R404 Transient alteration of awareness: Secondary | ICD-10-CM | POA: Diagnosis not present

## 2024-02-14 DIAGNOSIS — E039 Hypothyroidism, unspecified: Secondary | ICD-10-CM | POA: Diagnosis present

## 2024-02-14 DIAGNOSIS — K219 Gastro-esophageal reflux disease without esophagitis: Secondary | ICD-10-CM | POA: Diagnosis not present

## 2024-02-14 DIAGNOSIS — F39 Unspecified mood [affective] disorder: Secondary | ICD-10-CM | POA: Insufficient documentation

## 2024-02-14 DIAGNOSIS — Z8673 Personal history of transient ischemic attack (TIA), and cerebral infarction without residual deficits: Secondary | ICD-10-CM | POA: Diagnosis not present

## 2024-02-14 DIAGNOSIS — I480 Paroxysmal atrial fibrillation: Secondary | ICD-10-CM | POA: Diagnosis not present

## 2024-02-14 DIAGNOSIS — I5032 Chronic diastolic (congestive) heart failure: Secondary | ICD-10-CM

## 2024-02-14 DIAGNOSIS — Z7901 Long term (current) use of anticoagulants: Secondary | ICD-10-CM | POA: Diagnosis not present

## 2024-02-14 HISTORY — DX: Migraine, unspecified, not intractable, without status migrainosus: G43.909

## 2024-02-14 LAB — CBC
HCT: 42.2 % (ref 36.0–46.0)
Hemoglobin: 13.1 g/dL (ref 12.0–15.0)
MCH: 26.6 pg (ref 26.0–34.0)
MCHC: 31 g/dL (ref 30.0–36.0)
MCV: 85.8 fL (ref 80.0–100.0)
Platelets: 188 10*3/uL (ref 150–400)
RBC: 4.92 MIL/uL (ref 3.87–5.11)
RDW: 14.6 % (ref 11.5–15.5)
WBC: 9 10*3/uL (ref 4.0–10.5)
nRBC: 0 % (ref 0.0–0.2)

## 2024-02-14 LAB — BASIC METABOLIC PANEL WITH GFR
Anion gap: 11 (ref 5–15)
BUN: 20 mg/dL (ref 8–23)
CO2: 22 mmol/L (ref 22–32)
Calcium: 8.6 mg/dL — ABNORMAL LOW (ref 8.9–10.3)
Chloride: 106 mmol/L (ref 98–111)
Creatinine, Ser: 2.35 mg/dL — ABNORMAL HIGH (ref 0.44–1.00)
GFR, Estimated: 22 mL/min — ABNORMAL LOW (ref >60)
Glucose, Bld: 148 mg/dL — ABNORMAL HIGH (ref 70–99)
Potassium: 3.8 mmol/L (ref 3.5–5.1)
Sodium: 139 mmol/L (ref 135–145)

## 2024-02-14 LAB — HEPATIC FUNCTION PANEL
ALT: 10 U/L (ref 0–44)
AST: 21 U/L (ref 15–41)
Albumin: 3.3 g/dL — ABNORMAL LOW (ref 3.5–5.0)
Alkaline Phosphatase: 42 U/L (ref 38–126)
Bilirubin, Direct: 0.2 mg/dL (ref 0.0–0.2)
Indirect Bilirubin: 0.5 mg/dL (ref 0.3–0.9)
Total Bilirubin: 0.7 mg/dL (ref 0.0–1.2)
Total Protein: 6.7 g/dL (ref 6.5–8.1)

## 2024-02-14 LAB — LIPASE, BLOOD: Lipase: 41 U/L (ref 11–51)

## 2024-02-14 LAB — TROPONIN I (HIGH SENSITIVITY)
Troponin I (High Sensitivity): 3 ng/L (ref <18)
Troponin I (High Sensitivity): 4 ng/L (ref <18)

## 2024-02-14 MED ORDER — HEPARIN (PORCINE) 25000 UT/250ML-% IV SOLN
1250.0000 [IU]/h | INTRAVENOUS | Status: DC
  Administered 2024-02-15 (×2): 1250 [IU]/h via INTRAVENOUS
  Filled 2024-02-14 (×2): qty 250

## 2024-02-14 NOTE — Progress Notes (Signed)
 PHARMACY - ANTICOAGULATION CONSULT NOTE  Pharmacy Consult for heparin  Indication: chest pain/ACS, hx of DVT/PE given lupus anticoagulant (PTA Xarelto )  Allergies  Allergen Reactions   Lovenox  [Enoxaparin ] Itching   Pantoprazole  Anaphylaxis and Anxiety    kidney   Proton Pump Inhibitors     Biopsy proven acute interstitial nephritis; required dialysis in 09/2016   Penicillins Rash and Other (See Comments)    Tolerates rocephin    Sulfa Antibiotics Rash    Patient Measurements: Height: 5' 2.6" (159 cm) Weight: 94.3 kg (208 lb) IBW/kg (Calculated) : 51.48 HEPARIN  DW (KG): 73.3  Vital Signs: Temp: 98.3 F (36.8 C) (06/08 1928) Temp Source: Oral (06/08 1928) BP: 123/56 (06/08 2115) Pulse Rate: 61 (06/08 2115)  Labs: Recent Labs    02/14/24 1953 02/14/24 2130  HGB 13.1  --   HCT 42.2  --   PLT 188  --   CREATININE 2.35*  --   TROPONINIHS 3 4    Estimated Creatinine Clearance: 24.5 mL/min (A) (by C-G formula based on SCr of 2.35 mg/dL (H)).  Medications:  -Xarelto  15mg  PO every day (Last dose 6/7 PM)  Assessment: 76 yoF presented to ED with chest pain. PMH includes history of PE/DVT given lupus anticoagulant (07/2021) on Xarelto . Pharmacy consulted to dose heparin  until patient can undergo VQ scan  -CBC stable  Goal of Therapy:  Heparin  level 0.3-0.7 units/ml aPTT 66-102 seconds Monitor platelets by anticoagulation protocol: Yes   Plan:  No bolus given recent Xarelto  use Start heparin  infusion at 1250 units/hr Check aPTT and anti-Xa level in 8 hours and daily while on heparin  Monitor with aPTT until correlates with heparin  levels Continue to monitor H&H and platelets Follow up VQ scan in AM  Young Hensen, PharmD, BCPS Clinical Pharmacist 02/14/2024 11:55 PM

## 2024-02-14 NOTE — ED Provider Notes (Addendum)
 [place] [facility]GENC[facility] DEPARTMENT AT [facility] Provider Note   CSN: [id number] Arrival date & time: 02/14/24  1924     History  Chief Complaint  Patient presents with   Chest Pain    Rachael [name] is a 68 y.o. female.  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  Patient had just finished eating dinner and was walking out around 6:30 PM.  She had 10/10 sharp chest pain, feeling like a knife stabbing her in the left-sided chest through to her back.  Following this she was lightheaded, diaphoretic and short of breath.  The pain has improved to about 2/10 but she continues to be short of breath even while laying in bed.  She denies any cough, hemoptysis, leg swelling.  Patient states that she has developed blood clots while on anticoagulation required admission for heparin .  She has also needed fluid around her heart to be drained.  Home Medications Prior to Admission medications   Medication Sig Start Date End Date Taking? Authorizing Provider  Ascorbic Acid (VITAMIN C) 1000 MG tablet Take 1,000 mg by mouth daily.    [provider]  Biotin 1000 MCG tablet Take 1,000 mcg by mouth daily.    [provider]  Cholecalciferol (VITAMIN D) 50 MCG (2000 UT) CAPS Take 2,000 Units by mouth daily.    [provider]  citalopram  (CELEXA ) 40 MG tablet Take 40 mg by mouth daily. 10/14/21   [provider]  Cyanocobalamin  (B-12 COMPLIANCE INJECTION) 1000 MCG/ML KIT Inject 1,000 mcg as directed every 30 (thirty) days.    [provider]  famotidine  (PEPCID ) 20 MG tablet Take 20 mg by mouth 2 (two) times daily. 07/05/18   [provider]  furosemide  (LASIX ) 40 MG tablet Take 40 mg by mouth daily as needed for fluid or edema. 11/19/23   [provider]  isosorbide  mononitrate (IMDUR ) 30 MG 24 hr tablet Take 1 tablet (30 mg total) by mouth daily. 12/02/23   Revankar, Rajan R, MD  levothyroxine  (SYNTHROID ) 100 MCG tablet Take 100 mcg by mouth daily. 04/21/20   [provider]  Melatonin 10 MG TABS Take 10 mg by mouth as needed (sleep).    [provider]  nitroGLYCERIN  (NITROSTAT ) 0.4 MG SL tablet Place 1 tablet (0.4 mg total) under the tongue every 5 (five) minutes as needed for chest pain. 12/02/23   Revankar, Rajan R, MD  rosuvastatin (CRESTOR) 5 MG tablet Take 5 mg by mouth daily. 04/17/20   [provider]  topiramate (TOPAMAX) 25 MG tablet Take 25 mg by mouth daily. 10/14/21   [provider]  XARELTO  15 MG TABS tablet Take 15 mg by mouth daily.    [provider]      Allergies    Lovenox  [enoxaparin ], Pantoprazole , Proton pump inhibitors, Penicillins, and Sulfa antibiotics    Review of Systems   Review of Systems  Constitutional:  Positive for diaphoresis. Negative for fever.  Respiratory:  Positive for shortness of breath.   Cardiovascular:  Positive for chest pain. Negative for palpitations and leg swelling.  Gastrointestinal:  Negative for abdominal pain, diarrhea and vomiting.  Skin:  Negative for rash.  Neurological:  Positive for dizziness and light-headedness. Negative for headaches.    Physical Exam Updated Vital Signs BP (!) 123/56 (BP Location:  Right Arm)   Pulse 61   Temp 98.3 F (36.8 C) (Oral)   Resp 16   Ht 5' 2.6" (1.59 m)   Wt 94.3 kg   SpO2 95%   BMI 37.32 kg/m  Physical Exam Vitals and nursing note reviewed.  Constitutional:      General: She is not in acute distress.    Appearance: Normal appearance.  HENT:     Head: Normocephalic.     Mouth/Throat:     Mouth: Mucous membranes are moist.  Cardiovascular:     Rate and Rhythm: Normal rate.  Pulmonary:     Effort: Pulmonary effort is normal. No respiratory distress.  Abdominal:      Palpations: Abdomen is soft.     Tenderness: There is no abdominal tenderness.  Musculoskeletal:     Right lower leg: No edema.     Left lower leg: No edema.  Skin:    General: Skin is warm.  Neurological:     Mental Status: She is alert and oriented to person, place, and time. Mental status is at baseline.  Psychiatric:        Mood and Affect: Mood normal.     ED Results / Procedures / Treatments   Labs (all labs ordered are listed, but only abnormal results are displayed) Labs Reviewed  BASIC METABOLIC PANEL WITH GFR - Abnormal; Notable for the following components:      Result Value   Glucose, Bld 148 (*)    Creatinine, Ser 2.35 (*)    Calcium  8.6 (*)    GFR, Estimated 22 (*)    All other components within normal limits  HEPATIC FUNCTION PANEL - Abnormal; Notable for the following components:   Albumin  3.3 (*)    All other components within normal limits  CBC  LIPASE, BLOOD  TROPONIN I (HIGH SENSITIVITY)  TROPONIN I (HIGH SENSITIVITY)    EKG None  Radiology DG Chest 2 View Result Date: 02/14/2024 CLINICAL DATA:  Chest pain EXAM: CHEST - 2 VIEW COMPARISON:  11/17/2023 FINDINGS: Cardiac shadow is stable. The lungs are well aerated bilaterally. No focal infiltrate or effusion is seen. No bony abnormality is noted. IMPRESSION: No active cardiopulmonary disease. Electronically Signed   By: Violeta Grey M.D.   On: 02/14/2024 20:21    Procedures Procedures    Medications Ordered in ED Medications - No data to display  ED Course/ Medical Decision Making/ A&P                                 Medical Decision Making Amount and/or Complexity of Data Reviewed Labs: ordered. Radiology: ordered.  Risk Decision regarding hospitalization.   68 year old female presents to the emergency department after a sudden episode of left-sided chest pain radiating to the arm, jaw now associated with shortness of breath at rest.  History of PE/DVT, compliant with Xarelto .  But also  history of lupus anticoagulant and blood clot even while on anticoagulation requiring admission and heparin .  Vitals are normal and stable.  She at times is conversationally dyspneic but otherwise normal respirations and no hypoxia.  EKG is unchanged with the patient.  Blood work is reassuring, baseline chronic kidney dysfunction.  Troponins are negative with no significant delta.  Chest x-ray shows normal heart size and otherwise normal lungs.  I am unable to perform CT study with IV contrast to rule out PE and other acute pathology.  For this reason  I plan to admit the patient to medicine for VQ scan tomorrow.  After conversation with the hospitalist they recommend that we go ahead and place her on heparin .  Heparin  per pharmacy protocol ordered, ED pharmacist aware.  The pain has significantly improved and is now about a 2/10 achiness.  She has equal palpable radial pulses, have low suspicion for dissection at this time.  Patients evaluation and results requires admission for further treatment and care.  Spoke with hospitalist, reviewed patient's ED course and they accept admission.  Patient agrees with admission plan, offers no new complaints and is stable/unchanged at time of admit.        Final Clinical Impression(s) / ED Diagnoses Final diagnoses:  Chest pain, unspecified type    Rx / DC Orders ED Discharge Orders     None         Flonnie Humphrey, DO 02/14/24 2350    Irem Stoneham, Sidra Dredge, DO 02/14/24 2350

## 2024-02-14 NOTE — ED Provider Triage Note (Signed)
 Emergency Medicine Provider Triage Evaluation Note  Rachael [name] , a 68 y.o. female  was evaluated in triage.  Pt complains of chest pain.  Occurred after eating dinner tonight.  Mostly in the center of the chest but does radiate to the back.  Worse with deep breaths and she is short of breath.  Reports history of multiple PEs and DVTs.  Has lupus anticoagulant on Xarelto .  Received nitro which did help.  Review of Systems  Positive: See above Negative: See above  Physical Exam  BP 136/66 (BP Location: Right Arm)   Pulse 67   Temp 98.3 F (36.8 C) (Oral)   Resp 16   Ht 5' 2.6" (1.59 m)   Wt 94.3 kg   SpO2 95%   BMI 37.32 kg/m  Gen:   Awake, no distress   Resp:  Normal effort  MSK:   Moves extremities without difficulty  Other:    Medical Decision Making  Medically screening exam initiated at 8:00 PM.  Appropriate orders placed.  Rachael [name] was informed that the remainder of the evaluation will be completed by another provider, this initial triage assessment does not replace that evaluation, and the importance of remaining in the ED until their evaluation is complete.  Work up started   Rachael Mcmurray, PA-C 02/14/24 2003

## 2024-02-14 NOTE — ED Triage Notes (Signed)
 Pt here via [facility] from [employer].  Pt state she had just finished eating and was getting up to leave when she felt as if "someone stabbed me in the chest with a knife".  Pain was accompanied with dizziness, nausea and sob.  Given 324 asa, 2 sl nitroglycerin  with pain relief (from a 7 to a 5), and 4 of zofran .  Initial bp was 190/85 which decreased to 135/80 after the nitroglycerin .    Hr 60 Sats 98% Rr 16

## 2024-02-15 ENCOUNTER — Observation Stay (HOSPITAL_COMMUNITY)

## 2024-02-15 DIAGNOSIS — I5032 Chronic diastolic (congestive) heart failure: Secondary | ICD-10-CM

## 2024-02-15 DIAGNOSIS — R079 Chest pain, unspecified: Secondary | ICD-10-CM | POA: Diagnosis not present

## 2024-02-15 DIAGNOSIS — Z0389 Encounter for observation for other suspected diseases and conditions ruled out: Secondary | ICD-10-CM | POA: Diagnosis not present

## 2024-02-15 LAB — HEPARIN LEVEL (UNFRACTIONATED)
Heparin Unfractionated: 1.04 [IU]/mL — ABNORMAL HIGH (ref 0.30–0.70)
Heparin Unfractionated: 1 [IU]/mL — ABNORMAL HIGH (ref 0.30–0.70)

## 2024-02-15 LAB — HIV ANTIBODY (ROUTINE TESTING W REFLEX): HIV Screen 4th Generation wRfx: NONREACTIVE

## 2024-02-15 LAB — APTT
aPTT: 82 s — ABNORMAL HIGH (ref 24–36)
aPTT: 99 s — ABNORMAL HIGH (ref 24–36)

## 2024-02-15 MED ORDER — FAMOTIDINE 20 MG PO TABS
20.0000 mg | ORAL_TABLET | Freq: Every day | ORAL | Status: DC
  Administered 2024-02-15 – 2024-02-16 (×2): 20 mg via ORAL
  Filled 2024-02-15 (×2): qty 1

## 2024-02-15 MED ORDER — LEVOTHYROXINE SODIUM 100 MCG PO TABS
100.0000 ug | ORAL_TABLET | Freq: Every day | ORAL | Status: DC
  Administered 2024-02-15 – 2024-02-16 (×2): 100 ug via ORAL
  Filled 2024-02-15 (×2): qty 1

## 2024-02-15 MED ORDER — TOPIRAMATE 25 MG PO TABS
25.0000 mg | ORAL_TABLET | Freq: Every day | ORAL | Status: DC
  Administered 2024-02-15 – 2024-02-16 (×2): 25 mg via ORAL
  Filled 2024-02-15 (×2): qty 1

## 2024-02-15 MED ORDER — ROSUVASTATIN CALCIUM 5 MG PO TABS
5.0000 mg | ORAL_TABLET | Freq: Every day | ORAL | Status: DC
  Administered 2024-02-15 – 2024-02-16 (×2): 5 mg via ORAL
  Filled 2024-02-15 (×2): qty 1

## 2024-02-15 MED ORDER — TECHNETIUM TO 99M ALBUMIN AGGREGATED
4.2000 | Freq: Once | INTRAVENOUS | Status: AC | PRN
  Administered 2024-02-15: 4.2 via INTRAVENOUS

## 2024-02-15 MED ORDER — CITALOPRAM HYDROBROMIDE 20 MG PO TABS
40.0000 mg | ORAL_TABLET | Freq: Every day | ORAL | Status: DC
  Administered 2024-02-15 – 2024-02-16 (×2): 40 mg via ORAL
  Filled 2024-02-15: qty 2
  Filled 2024-02-15: qty 4

## 2024-02-15 MED ORDER — ISOSORBIDE MONONITRATE ER 30 MG PO TB24
30.0000 mg | ORAL_TABLET | Freq: Every day | ORAL | Status: DC
  Administered 2024-02-15 – 2024-02-16 (×2): 30 mg via ORAL
  Filled 2024-02-15 (×2): qty 1

## 2024-02-15 MED ORDER — ACETAMINOPHEN 325 MG PO TABS
650.0000 mg | ORAL_TABLET | Freq: Four times a day (QID) | ORAL | Status: DC | PRN
  Administered 2024-02-15: 650 mg via ORAL
  Filled 2024-02-15: qty 2

## 2024-02-15 MED ORDER — ACETAMINOPHEN 650 MG RE SUPP
650.0000 mg | Freq: Four times a day (QID) | RECTAL | Status: DC | PRN
Start: 2024-02-15 — End: 2024-02-16

## 2024-02-15 NOTE — Care Plan (Signed)
 This 68 yrs old female with PMH significant of CKD stage IV, HFpEF, hypertension, hypothyroidism, CVA, history of DVT/PE on Xarelto , lupus anticoagulant, class II obesity presented to the ED for the evaluation of acute onset chest pain.   Initial blood pressure 190/85 which improved to 135/80 after nitroglycerin .  Not tachycardic, tachypneic, or hypoxic.  Labs notable for creatinine 2.3, GFR 22, troponin negative x 2.  No EKG done.  Chest x-ray showing no active cardiopulmonary disease.  Patient cannot receive IV contrast for CT study to rule out PE given chronic kidney disease. She was started on IV heparin  and admitted for further evaluation.  Patient was seen and examined at bedside. She is going to get VQ scan and venous Doppler. If both negative  Eventual plan would be to continue Xarelto .

## 2024-02-15 NOTE — ED Notes (Signed)
Pt assisted to the bedside commode

## 2024-02-15 NOTE — ED Notes (Signed)
 Placed bt on monitor. Spoke with CCMD.

## 2024-02-15 NOTE — ED Notes (Signed)
 CCMD called for continuous cardiac monitoring.

## 2024-02-15 NOTE — ED Notes (Signed)
IV dressing changed 

## 2024-02-15 NOTE — Care Management Obs Status (Signed)
 MEDICARE OBSERVATION STATUS NOTIFICATION   Patient Details  Name: Rachael [name] MRN: [id number] Date of Birth: Feb 21, 1956   Medicare Observation Status Notification Given:  Yes    Angelis Gates, RN 02/15/2024, 6:30 PM

## 2024-02-15 NOTE — ED Notes (Signed)
Patient transported to Nuclear Medicine 

## 2024-02-15 NOTE — H&P (Signed)
 History and Physical    Rachael [name] EAV:[id number] DOB: 10/20/1955 DOA: 02/14/2024  PCP: Gaither Juba, MD  Patient coming from: Home  Chief Complaint: Chest pain  HPI: Rachael [name] is a 68 y.o. female with medical history significant of CKD stage IV, HFpEF, hypertension, hypothyroidism, CVA, history of DVT/PE on Xarelto , lupus anticoagulant, class II obesity presented to the ED via EMS for evaluation of acute onset chest pain.  She was given aspirin 325 mg, 2 sublingual nitroglycerin , and Zofran  prior to arrival.  Initial blood pressure 190/85 which improved to 135/80 after nitroglycerin .  Not tachycardic, tachypneic, or hypoxic.  Labs notable for creatinine 2.3, GFR 22, troponin negative x 2.  No EKG done.  Chest x-ray showing no active cardiopulmonary disease.  Patient cannot receive IV contrast for CT study to rule out PE given chronic kidney disease.  She was started on IV heparin  and hospitalist service called to admit for VQ scan in the morning.  Patient states she had dinner at [employer] with friends and was feeling well.  They were getting ready to leave the restaurant, when all of a sudden she started having severe stabbing substernal/left-sided chest pain associated with nausea and dyspnea.  Pain was worse when taking deep breaths.  As such, EMS was called.  She reports improvement of her symptoms now but still having chest pain when taking deep breaths.  She reports history of DVT/PE in May 2018 and was started on Coumadin  at that time.  Since then she had blood clots 3 more times despite compliance with anticoagulation and was referred to see a hematologist who diagnosed her with lupus anticoagulant and started her on Xarelto .  She confirms that she takes Xarelto  regularly and has not missed any doses.  She reports chronic bilateral lower extremity edema for which she takes Lasix  as needed but lately has noticed that her left leg appears bigger in size.  She has had some pain in both of  her legs.  No other complaints.  Denies vomiting or abdominal pain.  Review of Systems:  Review of Systems  All other systems reviewed and are negative.   Past Medical History:  Diagnosis Date    RV dilation by echocardiogram in setting of PE 01/23/2017   Acute deep vein thrombosis (DVT) of both lower extremities (HCC)    Acute renal failure superimposed on stage 5 chronic kidney disease, not on chronic dialysis (HCC) 09/22/2016   Acute respiratory failure with hypoxia (HCC)    AKI (acute kidney injury) (HCC) 09/22/2016   Bacteremia 10/23/2016   BMI 39.0-39.9,adult    Chest pain at rest    CHF (congestive heart failure) (HCC)    Heart cath in 2006   CHF exacerbation (HCC) 01/16/2017   Chronic kidney disease (CKD), stage V (HCC) 10/23/2016   CKD (chronic kidney disease)    Baseline creat 1.4-1.7   CKD (chronic kidney disease), stage IV (HCC) 01/16/2017   Community acquired pneumonia    Depression 10/24/2016   Dyspnea    Elevated troponin 01/16/2017   Hemodialysis catheter infection (HCC)    History of CVA (cerebrovascular accident) 10/24/2016   HTN (hypertension)    Hyperlipidemia 10/24/2016   Hypotension 01/21/2017   Hypothyroidism    Interstitial nephritis    Lupus anticoagulant positive 08/07/2021   Migraines    Nausea and vomiting 09/22/2016   Orthostasis 01/23/2017   Paroxysmal atrial fibrillation (HCC) 10/30/2016   Pruritus 05/16/2015   Pulmonary embolus (HCC)    Sepsis (  HCC) 01/16/2017   SOB (shortness of breath)    Supratherapeutic INR 01/25/2017   Urticaria 05/16/2015   UTI (urinary tract infection) 09/22/2016    Past Surgical History:  Procedure Laterality Date   CARDIAC CATHETERIZATION  2006   CHOLECYSTECTOMY     IR GENERIC HISTORICAL  09/29/2016   IR FLUORO GUIDE CV LINE RIGHT 09/29/2016 Fernando Hoyer, MD [facility]   IR GENERIC HISTORICAL  09/29/2016   IR US  GUIDE VASC ACCESS RIGHT [facility]   TEE WITHOUT CARDIOVERSION N/A 10/27/2016    Procedure: TRANSESOPHAGEAL ECHOCARDIOGRAM (TEE);  Surgeon: Liza Riggers, MD;  Location: [facility] ENDOSCOPY;  Service: Cardiovascular;  Laterality: N/A;     reports that she has never smoked. She has never used smokeless tobacco. She reports that she does not drink alcohol and does not use drugs.  Allergies  Allergen Reactions   Lovenox  [Enoxaparin ] Itching   Pantoprazole  Anaphylaxis and Anxiety    kidney   Proton Pump Inhibitors     Biopsy proven acute interstitial nephritis; required dialysis in 09/2016   Penicillins Rash and Other (See Comments)    Tolerates rocephin    Sulfa Antibiotics Rash    Family History  Problem Relation Age of Onset   Hypertension Mother    Stroke Mother    Coronary artery disease Mother        Late onset   Hypertension Father    Coronary artery disease Father        Late onset   Hypertension Sister    Hypertension Brother     Prior to Admission medications   Medication Sig Start Date End Date Taking? Authorizing Provider  Ascorbic Acid (VITAMIN C) 1000 MG tablet Take 1,000 mg by mouth daily.    [provider]  Biotin 1000 MCG tablet Take 1,000 mcg by mouth daily.    [provider]  Cholecalciferol (VITAMIN D) 50 MCG (2000 UT) CAPS Take 2,000 Units by mouth daily.    [provider]  citalopram  (CELEXA ) 40 MG tablet Take 40 mg by mouth daily. 10/14/21   [provider]  Cyanocobalamin  (B-12 COMPLIANCE INJECTION) 1000 MCG/ML KIT Inject 1,000 mcg as directed every 30 (thirty) days.    [provider]  famotidine  (PEPCID ) 20 MG tablet Take 20 mg by mouth 2 (two) times daily. 07/05/18   [provider]  furosemide  (LASIX ) 40 MG tablet Take 40 mg by mouth daily as needed for fluid or edema. 11/19/23   [provider]  isosorbide  mononitrate (IMDUR ) 30 MG 24 hr tablet Take 1 tablet (30 mg total) by mouth daily. 12/02/23   Revankar, Micael Adas, MD  levothyroxine  (SYNTHROID ) 100 MCG tablet Take 100 mcg  by mouth daily. 04/21/20   [provider]  Melatonin 10 MG TABS Take 10 mg by mouth as needed (sleep).    [provider]  nitroGLYCERIN  (NITROSTAT ) 0.4 MG SL tablet Place 1 tablet (0.4 mg total) under the tongue every 5 (five) minutes as needed for chest pain. 12/02/23   Revankar, Rajan R, MD  rosuvastatin (CRESTOR) 5 MG tablet Take 5 mg by mouth daily. 04/17/20   [provider]  topiramate (TOPAMAX) 25 MG tablet Take 25 mg by mouth daily. 10/14/21   [provider]  XARELTO  15 MG TABS tablet Take 15 mg by mouth daily.    [provider]    Physical Exam: Vitals:   02/14/24 1928 02/14/24 2115 02/15/24 0100  BP: 136/66 (!) 123/56 (!) 143/74  Pulse: 67  61 (!) 59  Resp: 16 16 17   Temp: 98.3 F (36.8 C)  98.2 F (36.8 C)  TempSrc: Oral  Oral  SpO2: 95% 95% 95%  Weight: 94.3 kg    Height: 5' 2.6" (1.59 m)      Physical Exam Vitals reviewed.  Constitutional:      General: She is not in acute distress. HENT:     Head: Normocephalic and atraumatic.  Eyes:     Extraocular Movements: Extraocular movements intact.  Cardiovascular:     Rate and Rhythm: Normal rate and regular rhythm.     Pulses: Normal pulses.  Pulmonary:     Effort: Pulmonary effort is normal. No respiratory distress.     Breath sounds: Normal breath sounds. No wheezing or rales.  Abdominal:     General: Bowel sounds are normal. There is no distension.     Palpations: Abdomen is soft.     Tenderness: There is no abdominal tenderness. There is no guarding.  Musculoskeletal:     Cervical back: Normal range of motion.     Comments: Left lower extremity appears larger in size Feet warm to touch and dorsalis pedis pulse palpable bilaterally  Skin:    General: Skin is warm and dry.  Neurological:     General: No focal deficit present.     Mental Status: She is alert and oriented to person, place, and time.     Labs on Admission: I have personally reviewed following labs  and imaging studies  CBC: Recent Labs  Lab 02/14/24 1953  WBC 9.0  HGB 13.1  HCT 42.2  MCV 85.8  PLT 188   Basic Metabolic Panel: Recent Labs  Lab 02/14/24 1953  NA 139  K 3.8  CL 106  CO2 22  GLUCOSE 148*  BUN 20  CREATININE 2.35*  CALCIUM  8.6*   GFR: Estimated Creatinine Clearance: 24.5 mL/min (A) (by C-G formula based on SCr of 2.35 mg/dL (H)). Liver Function Tests: Recent Labs  Lab 02/14/24 1953  AST 21  ALT 10  ALKPHOS 42  BILITOT 0.7  PROT 6.7  ALBUMIN  3.3*   Recent Labs  Lab 02/14/24 1953  LIPASE 41   No results for input(s): "AMMONIA" in the last 168 hours. Coagulation Profile: No results for input(s): "INR", "PROTIME" in the last 168 hours. Cardiac Enzymes: No results for input(s): "CKTOTAL", "CKMB", "CKMBINDEX", "TROPONINI" in the last 168 hours. BNP (last 3 results) No results for input(s): "PROBNP" in the last 8760 hours. HbA1C: No results for input(s): "HGBA1C" in the last 72 hours. CBG: No results for input(s): "GLUCAP" in the last 168 hours. Lipid Profile: No results for input(s): "CHOL", "HDL", "LDLCALC", "TRIG", "CHOLHDL", "LDLDIRECT" in the last 72 hours. Thyroid  Function Tests: No results for input(s): "TSH", "T4TOTAL", "FREET4", "T3FREE", "THYROIDAB" in the last 72 hours. Anemia Panel: No results for input(s): "VITAMINB12", "FOLATE", "FERRITIN", "TIBC", "IRON", "RETICCTPCT" in the last 72 hours. Urine analysis:    Component Value Date/Time   COLORURINE STRAW (A) 01/16/2017 0917   APPEARANCEUR CLEAR 01/16/2017 0917   LABSPEC 1.005 01/16/2017 0917   PHURINE 5.0 01/16/2017 0917   GLUCOSEU NEGATIVE 01/16/2017 0917   HGBUR SMALL (A) 01/16/2017 0917   BILIRUBINUR NEGATIVE 01/16/2017 0917   KETONESUR NEGATIVE 01/16/2017 0917   PROTEINUR NEGATIVE 01/16/2017 0917   NITRITE NEGATIVE 01/16/2017 0917   LEUKOCYTESUR NEGATIVE 01/16/2017 0917    Radiological Exams on Admission: DG Chest 2 View Result Date: 02/14/2024 CLINICAL DATA:   Chest pain EXAM: CHEST - 2  VIEW COMPARISON:  11/17/2023 FINDINGS: Cardiac shadow is stable. The lungs are well aerated bilaterally. No focal infiltrate or effusion is seen. No bony abnormality is noted. IMPRESSION: No active cardiopulmonary disease. Electronically Signed   By: Violeta Grey M.D.   On: 02/14/2024 20:21    Assessment and Plan  Chest pain Chest x-ray showing no active cardiopulmonary disease.  EKG ordered and currently pending although doubt ACS as troponin negative x 2.  Patient is not tachycardic, tachypneic, or hypoxic.  ?GI etiology of chest pain as it started after eating.  However, she has known history of lupus anticoagulant and reports having recurrent VTE in the past despite compliance with anticoagulation.  She cannot receive IV contrast for CT study to rule out PE given CKD stage IV.  Continue IV heparin  until VQ scan can be done in the morning to rule out PE.  Also left lower extremity larger in size on exam and she is reporting pain in both of her lower extremities.  Bilateral lower extremity Dopplers ordered to assess for DVT.  CKD stage IV Creatinine 2.3, at baseline.  Chronic HFpEF Last echo done in March 2025 showing EF 60 to 65%, trivial mitral regurgitation, and mild tricuspid regurgitation.  Does not appear volume overloaded.  Hypertension SBP currently in the 140s.  Continue Imdur .  Hypothyroidism Continue Synthroid .  History of lupus anticoagulant and recurrent DVT/PE Xarelto  held at this time as patient has been started on IV heparin .  Mood disorder Continue home medications.  GERD Continue Pepcid .  Hyperlipidemia Continue Crestor.  Code Status: Full Code (discussed with the patient) Level of care: Progressive Care Unit Admission status: It is my clinical opinion that referral for OBSERVATION is reasonable and necessary in this patient based on the above information provided. The aforementioned taken together are felt to place the patient at high  risk for further clinical deterioration. However, it is anticipated that the patient may be medically stable for discharge from the hospital within 24 to 48 hours.  Juliette Oh MD [facility]  If 7PM-7AM, please contact night-coverage [email or web address]  02/15/2024, 1:49 AM

## 2024-02-15 NOTE — Progress Notes (Signed)
 PHARMACY - ANTICOAGULATION CONSULT NOTE  Pharmacy Consult for heparin  Indication: chest pain/ACS, hx of DVT/PE given lupus anticoagulant (PTA Xarelto )  Allergies  Allergen Reactions   Lovenox  [Enoxaparin ] Itching   Pantoprazole  Anaphylaxis and Anxiety    kidney   Proton Pump Inhibitors     Biopsy proven acute interstitial nephritis; required dialysis in 09/2016   Penicillins Rash and Other (See Comments)    Tolerates rocephin    Sulfa Antibiotics Rash    Patient Measurements: Height: 5' 2.6" (159 cm) Weight: 94.3 kg (208 lb) IBW/kg (Calculated) : 51.48 HEPARIN  DW (KG): 73.3  Vital Signs: Temp: 98.1 F (36.7 C) (06/09 0823) Temp Source: Oral (06/09 0823) BP: 136/49 (06/09 0823) Pulse Rate: 68 (06/09 0823)  Labs: Recent Labs    02/14/24 1953 02/14/24 2130 02/15/24 1000  HGB 13.1  --   --   HCT 42.2  --   --   PLT 188  --   --   APTT  --   --  82*  HEPARINUNFRC  --   --  1.04*  CREATININE 2.35*  --   --   TROPONINIHS 3 4  --     Estimated Creatinine Clearance: 24.5 mL/min (A) (by C-G formula based on SCr of 2.35 mg/dL (H)).  Medications:  -Xarelto  15mg  PO every day (Last dose 6/7 PM)  Assessment: 19 yoF presented to ED with chest pain. PMH includes history of PE/DVT given lupus anticoagulant (07/2021) on Xarelto . Pharmacy consulted to dose heparin  until patient can undergo VQ scan  Heparin  level falsely elevated at 1.04 in setting of recent DOAC use. APTT therapeutic at 82 on 1250 units/hr. Hgb 13.1, plt 188. No signs of bleeding noted  Goal of Therapy:  Heparin  level 0.3-0.7 units/ml aPTT 66-102 seconds Monitor platelets by anticoagulation protocol: Yes   Plan:  Continue heparin  infusion at 1250 units/hr Check confirmatory aPTT and anti-Xa level in 8 hours and daily while on heparin  Monitor with aPTT until correlates with heparin  levels Continue to monitor H&H and platelets Follow up VQ scan   Thank you for involving pharmacy in the patient's care.    Barbra Boone, PharmD PGY1 Acute Care Pharmacy Resident  02/15/2024 11:23 AM

## 2024-02-15 NOTE — Progress Notes (Signed)
 PHARMACY - ANTICOAGULATION CONSULT NOTE  Pharmacy Consult for heparin  Indication: chest pain/ACS, hx of DVT/PE given lupus anticoagulant (PTA Xarelto )  Allergies  Allergen Reactions   Lovenox  [Enoxaparin ] Itching   Pantoprazole  Anaphylaxis and Anxiety    kidney   Proton Pump Inhibitors     Biopsy proven acute interstitial nephritis; required dialysis in 09/2016   Penicillins Rash and Other (See Comments)    Tolerates rocephin    Sulfa Antibiotics Rash    Patient Measurements: Height: (P) 5' 2.5" (158.8 cm) Weight: (P) 97.9 kg (215 lb 13.3 oz) IBW/kg (Calculated) : (P) 51.25 HEPARIN  DW (KG): (P) 74.2  Vital Signs: Temp: 97.9 F (36.6 C) (06/09 1959) Temp Source: (P) Oral (06/09 1959) BP: 127/59 (06/09 1959) Pulse Rate: 71 (06/09 1959)  Labs: Recent Labs    02/14/24 1953 02/14/24 2130 02/15/24 1000 02/15/24 2003  HGB 13.1  --   --   --   HCT 42.2  --   --   --   PLT 188  --   --   --   APTT  --   --  82* 99*  HEPARINUNFRC  --   --  1.04* 1.00*  CREATININE 2.35*  --   --   --   TROPONINIHS 3 4  --   --     Estimated Creatinine Clearance: 24.5 mL/min (A) (by C-G formula based on SCr of 2.35 mg/dL (H)).  Medications:  -Xarelto  15mg  PO every day (Last dose 6/7 PM)  Assessment: 3 yoF presented to ED with chest pain. PMH includes history of PE/DVT given lupus anticoagulant (07/2021) on Xarelto . Pharmacy consulted to dose heparin  until patient can undergo VQ scan  Heparin  level falsely elevated at 1.04 in setting of recent DOAC use. APTT therapeutic at 82 on 1250 units/hr. Hgb 13.1, plt 188. No signs of bleeding noted  6/9 PM - aPTT 99 sec is therapeutic, heparin  level 1.00 (still impacted by PTA DOAC).  No new perfusion defects to suggest acute PE.    Goal of Therapy:  Heparin  level 0.3-0.7 units/ml aPTT 66-102 seconds Monitor platelets by anticoagulation protocol: Yes   Plan:  Continue heparin  infusion at 1250 units/hr Daily CBC, heparin  level and aPTT until  correlates with heparin  levels Continue to monitor H&H and platelets F/u LE dopplers  Thank you for involving pharmacy in the patient's care.   Cecillia Cogan, PharmD Clinical Pharmacist 02/15/2024  8:33 PM

## 2024-02-16 ENCOUNTER — Observation Stay (HOSPITAL_BASED_OUTPATIENT_CLINIC_OR_DEPARTMENT_OTHER)

## 2024-02-16 ENCOUNTER — Encounter (HOSPITAL_COMMUNITY): Payer: Self-pay | Admitting: Internal Medicine

## 2024-02-16 DIAGNOSIS — M7989 Other specified soft tissue disorders: Secondary | ICD-10-CM

## 2024-02-16 DIAGNOSIS — R079 Chest pain, unspecified: Secondary | ICD-10-CM | POA: Diagnosis not present

## 2024-02-16 LAB — HEPARIN LEVEL (UNFRACTIONATED): Heparin Unfractionated: >1.1 [IU]/mL — ABNORMAL HIGH (ref 0.30–0.70)

## 2024-02-16 LAB — CBC
HCT: 40.3 % (ref 36.0–46.0)
Hemoglobin: 12.5 g/dL (ref 12.0–15.0)
MCH: 26.5 pg (ref 26.0–34.0)
MCHC: 31 g/dL (ref 30.0–36.0)
MCV: 85.4 fL (ref 80.0–100.0)
Platelets: 155 10*3/uL (ref 150–400)
RBC: 4.72 MIL/uL (ref 3.87–5.11)
RDW: 14.5 % (ref 11.5–15.5)
WBC: 5.9 10*3/uL (ref 4.0–10.5)
nRBC: 0 % (ref 0.0–0.2)

## 2024-02-16 LAB — APTT: aPTT: 131 s — ABNORMAL HIGH (ref 24–36)

## 2024-02-16 MED ORDER — HEPARIN (PORCINE) 25000 UT/250ML-% IV SOLN
1050.0000 [IU]/h | INTRAVENOUS | Status: DC
  Administered 2024-02-16: 1050 [IU]/h via INTRAVENOUS

## 2024-02-16 NOTE — Progress Notes (Signed)
 Bilateral lower extremity venous duplex has been completed. Preliminary results can be found in CV Proc through chart review.   02/16/24 10:26 AM Birda Buffy RVT

## 2024-02-16 NOTE — Discharge Summary (Signed)
 Physician Discharge Summary  Rachael [name] ION:[id number] DOB: 12-15-1955 DOA: 02/14/2024  PCP: Gaither Juba, MD  Admit date: 02/14/2024  Discharge date: 02/16/2024  Admitted From: Home  Disposition:  Home  Recommendations for Outpatient Follow-up:  Follow up with PCP in 1-2 weeks. Please obtain BMP/CBC in one week Advised to continue Xarelto  as prescribed. Workup for chest pain has been unremarkable.  Home Health:None Equipment/Devices:None  Discharge Condition: Stable CODE STATUS:Full code Diet recommendation: Heart Healthy  Brief [facility] Course: This 68 yrs old female with PMH significant of CKD stage IV, HFpEF, hypertension, hypothyroidism, CVA, history of DVT/PE on Xarelto , lupus anticoagulant, class II obesity presented to the ED for the evaluation of acute onset chest pain.   Initial blood pressure 190/85 which improved to 135/80 after nitroglycerin .  Not tachycardic, tachypneic, or hypoxic. Labs notable for creatinine 2.3, GFR 22, troponin negative x 2.  No EKG done.  Chest x-ray showing no active cardiopulmonary disease.  Patient cannot receive IV contrast for CT study to rule out PE given chronic kidney disease. She was started on IV heparin  and admitted for further evaluation.  Patient was seen and examined at bedside.  VQ scan > No evidence of pulmonary embolism. Bilateral venous duplex negative for DVT.  Patient feels much better,  Chest pain has resolved.  Patient wants to be discharged home.  Patient is discharged on Xarelto .  Advised to follow-up with PCP.  Discharge Diagnoses:  Principal Problem:   Chest pain Active Problems:   HTN (hypertension)   Hypothyroidism   CKD (chronic kidney disease), stage IV (HCC)   Chronic heart failure with preserved ejection fraction (HFpEF) [facility])  Discharge Instructions  Discharge Instructions     Call MD for:  difficulty breathing, headache or visual disturbances   Complete by: As directed    Call MD for:  persistant  dizziness or light-headedness   Complete by: As directed    Call MD for:  persistant nausea and vomiting   Complete by: As directed    Diet - low sodium heart healthy   Complete by: As directed    Diet Carb Modified   Complete by: As directed    Discharge instructions   Complete by: As directed    Advised to follow-up with primary care physician in 1 week. Advised to continue Xarelto  as prescribed. Workup for chest pain has been unremarkable.   Increase activity slowly   Complete by: As directed       Allergies as of 02/16/2024       Reactions   Lovenox  [enoxaparin ] Itching   Pantoprazole  Anaphylaxis, Anxiety   kidney   Proton Pump Inhibitors    Biopsy proven acute interstitial nephritis; required dialysis in 09/2016   Penicillins Rash, Other (See Comments)   Tolerates rocephin    Sulfa Antibiotics Rash        Medication List     TAKE these medications    B-12 Compliance Injection 1000 MCG/ML Kit Generic drug: Cyanocobalamin  Inject 1,000 mcg as directed every 30 (thirty) days.   citalopram  40 MG tablet Commonly known as: CELEXA  Take 40 mg by mouth daily.   famotidine  20 MG tablet Commonly known as: PEPCID  Take 20 mg by mouth daily.   furosemide  40 MG tablet Commonly known as: LASIX  Take 40 mg by mouth daily as needed for fluid or edema.   isosorbide  mononitrate 30 MG 24 hr tablet Commonly known as: IMDUR  Take 1 tablet (30 mg total) by mouth daily.   levothyroxine  100  MCG tablet Commonly known as: SYNTHROID  Take 100 mcg by mouth daily.   Melatonin 10 MG Tabs Take 10 mg by mouth as needed (sleep).   multivitamin with minerals Tabs tablet Take 1 tablet by mouth daily.   nitroGLYCERIN  0.4 MG SL tablet Commonly known as: NITROSTAT  Place 1 tablet (0.4 mg total) under the tongue every 5 (five) minutes as needed for chest pain.   rosuvastatin 5 MG tablet Commonly known as: CRESTOR Take 5 mg by mouth daily.   topiramate 25 MG tablet Commonly known as:  TOPAMAX Take 25 mg by mouth daily.   Vitamin D 50 MCG (2000 UT) Caps Take 2,000 Units by mouth daily.   VITAMIN E PO Take 1 capsule by mouth daily.   Xarelto  15 MG Tabs tablet Generic drug: Rivaroxaban  Take 15 mg by mouth daily.        Follow-up Information     Gaither Juba, MD Follow up in 1 week(s).   Specialty: Family Medicine Contact information: [address] [place] [address] [phone]                Allergies  Allergen Reactions   Lovenox  [Enoxaparin ] Itching   Pantoprazole  Anaphylaxis and Anxiety    kidney   Proton Pump Inhibitors     Biopsy proven acute interstitial nephritis; required dialysis in 09/2016   Penicillins Rash and Other (See Comments)    Tolerates rocephin    Sulfa Antibiotics Rash    Consultations: None   Procedures/Studies: VAS US  LOWER EXTREMITY VENOUS (DVT) Result Date: 02/16/2024  Lower Venous DVT Study Patient Name:  Rachael [name]  Date of Exam:   02/16/2024 Medical Rec #: [id number]    Accession #:    [id number] Date of Birth: December 17, 1955   Patient Gender: F Patient Age:   68 years Exam Location:  [facility] Procedure:      VAS US  LOWER EXTREMITY VENOUS (DVT) Referring Phys: Corky Diener RATHORE --------------------------------------------------------------------------------  Indications: Swelling.  Risk Factors: None identified. Limitations: Body habitus and poor ultrasound/tissue interface. Comparison Study: No prior studies. Performing Technologist: Lerry Ransom RVT  Examination Guidelines: A complete evaluation includes B-mode imaging, spectral Doppler, color Doppler, and power Doppler as needed of all accessible portions of each vessel. Bilateral testing is considered an integral part of a complete examination. Limited examinations for reoccurring indications may be performed as noted. The reflux portion of the exam is performed with the patient in reverse Trendelenburg.   +---------+---------------+---------+-----------+----------+--------------+ RIGHT    CompressibilityPhasicitySpontaneityPropertiesThrombus Aging +---------+---------------+---------+-----------+----------+--------------+ CFV      Full           Yes      Yes                                 +---------+---------------+---------+-----------+----------+--------------+ SFJ      Full                                                        +---------+---------------+---------+-----------+----------+--------------+ FV Prox  Full                                                        +---------+---------------+---------+-----------+----------+--------------+  FV Mid   Full                                                        +---------+---------------+---------+-----------+----------+--------------+ FV DistalFull                                                        +---------+---------------+---------+-----------+----------+--------------+ PFV      Full                                                        +---------+---------------+---------+-----------+----------+--------------+ POP      Full           Yes      Yes                                 +---------+---------------+---------+-----------+----------+--------------+ PTV      Full                                                        +---------+---------------+---------+-----------+----------+--------------+ PERO     Full                                                        +---------+---------------+---------+-----------+----------+--------------+   +---------+---------------+---------+-----------+----------+--------------+ LEFT     CompressibilityPhasicitySpontaneityPropertiesThrombus Aging +---------+---------------+---------+-----------+----------+--------------+ CFV      Full           Yes      Yes                                  +---------+---------------+---------+-----------+----------+--------------+ SFJ      Full                                                        +---------+---------------+---------+-----------+----------+--------------+ FV Prox  Full                                                        +---------+---------------+---------+-----------+----------+--------------+ FV Mid   Full                                                        +---------+---------------+---------+-----------+----------+--------------+  FV DistalFull                                                        +---------+---------------+---------+-----------+----------+--------------+ PFV      Full                                                        +---------+---------------+---------+-----------+----------+--------------+ POP      Full           Yes      Yes                                 +---------+---------------+---------+-----------+----------+--------------+ PTV      Full                                                        +---------+---------------+---------+-----------+----------+--------------+ PERO     Full                                                        +---------+---------------+---------+-----------+----------+--------------+    Summary: RIGHT: - There is no evidence of deep vein thrombosis in the lower extremity. However, portions of this examination were limited- see technologist comments above.  - No cystic structure found in the popliteal fossa.  LEFT: - There is no evidence of deep vein thrombosis in the lower extremity. However, portions of this examination were limited- see technologist comments above.  - No cystic structure found in the popliteal fossa.  *See table(s) above for measurements and observations.    Preliminary    NM Pulmonary Perfusion Result Date: 02/15/2024 CLINICAL DATA:  Pulmonary embolism (PE) suspected, high prob EXAM: NUCLEAR MEDICINE  PERFUSION LUNG SCAN TECHNIQUE: Perfusion images were obtained in multiple projections after intravenous injection of radiopharmaceutical. No ventilation imaging performed. RADIOPHARMACEUTICALS:  4.2 mCi Tc-7m MAA IV COMPARISON:  The patient has had multiple prior nuclear medicine pulmonary perfusion studies, most recently done 12/30/2022 and 07/07/2021. Noncontrast chest CT 12/30/2022. Chest radiographs 02/14/2024. FINDINGS: There are chronic perfusion defects in both lungs which are similar to the previous studies. There is a large perfusion defect involving the right upper lobe. No definite change is seen from the previous study. IMPRESSION: Chronically abnormal study, similar to previous nuclear medicine perfusion studies and likely secondary to previous pulmonary thromboembolic disease. No new perfusion defects are identified to suggest acute pulmonary embolism. Electronically Signed   By: Elmon Hagedorn M.D.   On: 02/15/2024 14:34   DG Chest 2 View Result Date: 02/14/2024 CLINICAL DATA:  Chest pain EXAM: CHEST - 2 VIEW COMPARISON:  11/17/2023 FINDINGS: Cardiac shadow is stable. The lungs are well aerated bilaterally. No focal infiltrate or effusion is seen. No bony abnormality is noted. IMPRESSION: No active cardiopulmonary disease. Electronically Signed   By:  Violeta Grey M.D.   On: 02/14/2024 20:21    Subjective: Patient was seen and examined at bedside.  Overnight events noted.   Patient reports doing much better and wants to be discharged.  Discharge Exam: Vitals:   02/16/24 0952 02/16/24 1200  BP: (!) 151/60 (!) 144/63  Pulse: 63 62  Resp: 18 15  Temp:  97.8 F (36.6 C)  SpO2: 95% 98%   Vitals:   02/16/24 0534 02/16/24 0753 02/16/24 0952 02/16/24 1200  BP: (!) 140/62 (!) 144/72 (!) 151/60 (!) 144/63  Pulse: 65 61 63 62  Resp:  16 18 15   Temp: 97.9 F (36.6 C) 98.1 F (36.7 C)  97.8 F (36.6 C)  TempSrc: Oral Oral  Oral  SpO2: 98% 94% 95% 98%  Weight:      Height:         General: Pt is alert, awake, not in acute distress Cardiovascular: RRR, S1/S2 +, no rubs, no gallops Respiratory: CTA bilaterally, no wheezing, no rhonchi Abdominal: Soft, NT, ND, bowel sounds + Extremities: no edema, no cyanosis    The results of significant diagnostics from this hospitalization (including imaging, microbiology, ancillary and laboratory) are listed below for reference.     Microbiology: No results found for this or any previous visit (from the past 240 hours).   Labs: BNP (last 3 results) No results for input(s): "BNP" in the last 8760 hours. Basic Metabolic Panel: Recent Labs  Lab 02/14/24 1953  NA 139  K 3.8  CL 106  CO2 22  GLUCOSE 148*  BUN 20  CREATININE 2.35*  CALCIUM  8.6*   Liver Function Tests: Recent Labs  Lab 02/14/24 1953  AST 21  ALT 10  ALKPHOS 42  BILITOT 0.7  PROT 6.7  ALBUMIN  3.3*   Recent Labs  Lab 02/14/24 1953  LIPASE 41   No results for input(s): "AMMONIA" in the last 168 hours. CBC: Recent Labs  Lab 02/14/24 1953 02/16/24 0437  WBC 9.0 5.9  HGB 13.1 12.5  HCT 42.2 40.3  MCV 85.8 85.4  PLT 188 155   Cardiac Enzymes: No results for input(s): "CKTOTAL", "CKMB", "CKMBINDEX", "TROPONINI" in the last 168 hours. BNP: Invalid input(s): "POCBNP" CBG: No results for input(s): "GLUCAP" in the last 168 hours. D-Dimer No results for input(s): "DDIMER" in the last 72 hours. Hgb A1c No results for input(s): "HGBA1C" in the last 72 hours. Lipid Profile No results for input(s): "CHOL", "HDL", "LDLCALC", "TRIG", "CHOLHDL", "LDLDIRECT" in the last 72 hours. Thyroid  function studies No results for input(s): "TSH", "T4TOTAL", "T3FREE", "THYROIDAB" in the last 72 hours.  Invalid input(s): "FREET3" Anemia work up No results for input(s): "VITAMINB12", "FOLATE", "FERRITIN", "TIBC", "IRON", "RETICCTPCT" in the last 72 hours. Urinalysis    Component Value Date/Time   COLORURINE STRAW (A) 01/16/2017 0917   APPEARANCEUR  CLEAR 01/16/2017 0917   LABSPEC 1.005 01/16/2017 0917   PHURINE 5.0 01/16/2017 0917   GLUCOSEU NEGATIVE 01/16/2017 0917   HGBUR SMALL (A) 01/16/2017 0917   BILIRUBINUR NEGATIVE 01/16/2017 0917   KETONESUR NEGATIVE 01/16/2017 0917   PROTEINUR NEGATIVE 01/16/2017 0917   NITRITE NEGATIVE 01/16/2017 0917   LEUKOCYTESUR NEGATIVE 01/16/2017 0917   Sepsis Labs Recent Labs  Lab 02/14/24 1953 02/16/24 0437  WBC 9.0 5.9   Microbiology No results found for this or any previous visit (from the past 240 hours).   Time coordinating discharge: Over 30 minutes  SIGNED:   Magdalene School, MD  [facility] 02/16/2024, 1:56 PM Pager  If 7PM-7AM, please contact night-coverage

## 2024-02-16 NOTE — TOC CM/SW Note (Signed)
 Transition of Care [facility]) - Inpatient Brief Assessment   Patient Details  Name: Rachael [name] MRN: [id number] Date of Birth: Jun 23, 1956  [facility] CM/SW Contact:    Cosimo Diones, RN Phone Number: 02/16/2024, 11:17 AM   Clinical Narrative: Patient presented for chest pain. PTA patient states she was independent from home with her brother. Patient states she has DME cane and rolling walker; however, does not need to use them. Patient states she drives to PCP appointments and gets her medications without any issues. No home needs identified at this time.    Transition of Care Asessment: Insurance and Status: Insurance coverage has been reviewed Patient has primary care physician: Yes Home environment has been reviewed: reviewed Prior level of function:: independent Prior/Current Home Services: No current home services Social Drivers of Health Review: SDOH reviewed no interventions necessary Readmission risk has been reviewed: Yes Transition of care needs: no transition of care needs at this time

## 2024-02-16 NOTE — Progress Notes (Signed)
 PHARMACY - ANTICOAGULATION CONSULT NOTE  Pharmacy Consult for heparin  Indication: chest pain/ACS, hx of DVT/PE given lupus anticoagulant (PTA Xarelto )  Allergies  Allergen Reactions   Lovenox  [Enoxaparin ] Itching   Pantoprazole  Anaphylaxis and Anxiety    kidney   Proton Pump Inhibitors     Biopsy proven acute interstitial nephritis; required dialysis in 09/2016   Penicillins Rash and Other (See Comments)    Tolerates rocephin    Sulfa Antibiotics Rash    Patient Measurements: Height: 5' 2.5" (158.8 cm) Weight: 97.9 kg (215 lb 13.3 oz) IBW/kg (Calculated) : 51.25 HEPARIN  DW (KG): 74.2  Vital Signs: Temp: 97.9 F (36.6 C) (06/10 0534) Temp Source: Oral (06/10 0534) BP: 140/62 (06/10 0534) Pulse Rate: 65 (06/10 0534)  Labs: Recent Labs    02/14/24 1953 02/14/24 2130 02/15/24 1000 02/15/24 2003 02/16/24 0437  HGB 13.1  --   --   --  12.5  HCT 42.2  --   --   --  40.3  PLT 188  --   --   --  155  APTT  --   --  82* 99* 131*  HEPARINUNFRC  --   --  1.04* 1.00* >1.10*  CREATININE 2.35*  --   --   --   --   TROPONINIHS 3 4  --   --   --     Estimated Creatinine Clearance: 24.9 mL/min (A) (by C-G formula based on SCr of 2.35 mg/dL (H)).  Medications:  -Xarelto  15mg  PO every day (Last dose 6/7 PM)  Assessment: 44 yoF presented to ED with chest pain. PMH includes history of PE/DVT given lupus anticoagulant (07/2021) on Xarelto . Pharmacy consulted to dose heparin  until patient can undergo VQ scan  6/10 AM - aPTT 131 sec is supra-therapeutic, heparin  level >1.1 (still impacted by PTA DOAC).  Will hold for 1 hour then resume at lower rate. No overt s/sx of bleeding per RN.  Goal of Therapy:  Heparin  level 0.3-0.7 units/ml aPTT 66-102 seconds Monitor platelets by anticoagulation protocol: Yes   Plan:  Hold heparin  infusion x1 hour Resume heparin  infusion at 1050 units/hr  Daily CBC, heparin  level and aPTT until correlates with heparin  levels Continue to monitor H&H  and platelets F/u LE dopplers  Thank you for involving pharmacy in the patient's care.   Mohammed Andrew, PharmD Clinical Pharmacist 02/16/2024 6:38 AM Please check AMION for all [facility] Pharmacy numbers

## 2024-02-16 NOTE — Discharge Instructions (Signed)
 Advised to follow-up with primary care physician in 1 week. Advised to continue Xarelto  as prescribed. Workup for chest pain has been unremarkable.

## 2024-02-16 NOTE — Progress Notes (Signed)
 Discharge instructions (including medications) discussed with and copy provided to patient/caregiver

## 2024-02-25 DIAGNOSIS — D519 Vitamin B12 deficiency anemia, unspecified: Secondary | ICD-10-CM | POA: Diagnosis not present

## 2024-03-04 DIAGNOSIS — E039 Hypothyroidism, unspecified: Secondary | ICD-10-CM | POA: Diagnosis not present

## 2024-03-04 DIAGNOSIS — Z79899 Other long term (current) drug therapy: Secondary | ICD-10-CM | POA: Diagnosis not present

## 2024-03-04 DIAGNOSIS — Z0000 Encounter for general adult medical examination without abnormal findings: Secondary | ICD-10-CM | POA: Diagnosis not present

## 2024-03-28 DIAGNOSIS — D519 Vitamin B12 deficiency anemia, unspecified: Secondary | ICD-10-CM | POA: Diagnosis not present

## 2024-05-11 DIAGNOSIS — D519 Vitamin B12 deficiency anemia, unspecified: Secondary | ICD-10-CM | POA: Diagnosis not present

## 2024-06-01 DIAGNOSIS — R252 Cramp and spasm: Secondary | ICD-10-CM | POA: Diagnosis not present

## 2024-06-01 DIAGNOSIS — N644 Mastodynia: Secondary | ICD-10-CM | POA: Diagnosis not present

## 2024-06-23 DIAGNOSIS — N644 Mastodynia: Secondary | ICD-10-CM | POA: Diagnosis not present

## 2024-06-23 DIAGNOSIS — D519 Vitamin B12 deficiency anemia, unspecified: Secondary | ICD-10-CM | POA: Diagnosis not present

## 2024-06-23 DIAGNOSIS — R92323 Mammographic fibroglandular density, bilateral breasts: Secondary | ICD-10-CM | POA: Diagnosis not present

## 2024-07-05 DIAGNOSIS — J069 Acute upper respiratory infection, unspecified: Secondary | ICD-10-CM | POA: Diagnosis not present

## 2024-08-22 ENCOUNTER — Other Ambulatory Visit: Payer: Self-pay

## 2024-08-22 DIAGNOSIS — G43909 Migraine, unspecified, not intractable, without status migrainosus: Secondary | ICD-10-CM | POA: Insufficient documentation

## 2024-08-24 ENCOUNTER — Encounter: Payer: Self-pay | Admitting: Cardiology

## 2024-08-24 ENCOUNTER — Ambulatory Visit: Attending: Cardiology | Admitting: Cardiology

## 2024-08-24 VITALS — BP 138/88 | HR 76 | Ht 62.6 in | Wt 213.6 lb

## 2024-08-24 DIAGNOSIS — Z8673 Personal history of transient ischemic attack (TIA), and cerebral infarction without residual deficits: Secondary | ICD-10-CM

## 2024-08-24 DIAGNOSIS — I10 Essential (primary) hypertension: Secondary | ICD-10-CM

## 2024-08-24 DIAGNOSIS — E782 Mixed hyperlipidemia: Secondary | ICD-10-CM | POA: Diagnosis not present

## 2024-08-24 DIAGNOSIS — Z86711 Personal history of pulmonary embolism: Secondary | ICD-10-CM | POA: Diagnosis not present

## 2024-08-24 DIAGNOSIS — N289 Disorder of kidney and ureter, unspecified: Secondary | ICD-10-CM | POA: Insufficient documentation

## 2024-08-24 MED ORDER — ISOSORBIDE MONONITRATE ER 30 MG PO TB24: 30.0000 mg | ORAL_TABLET | Freq: Every day | ORAL | 3 refills | Status: AC

## 2024-08-24 NOTE — Patient Instructions (Signed)

## 2024-08-24 NOTE — Progress Notes (Signed)
 Cardiology Office Note:    Date:  08/24/2024   ID:  Rachael [name], DOB 06-05-1956, MRN [id number]  PCP:  Rachael Marcellus RAMAN, MD  Cardiologist:  Jennifer JONELLE Crape, MD   Referring MD: Rachael Marcellus RAMAN, MD    ASSESSMENT:    1. History of CVA (cerebrovascular accident)   2. History of pulmonary embolism   3. Primary hypertension   4. Mixed hyperlipidemia   5. Renal insufficiency    PLAN:    In order of problems listed above:  Primary prevention stressed with the patient.  Importance of compliance with diet medication stressed and patient verbalized standing. Essential hypertension: Blood pressure stable and diet was emphasized.  Lifestyle modification urged.  She mentions to me that her blood pressure range and the numbers of 130/70 at home.  Salt intake issues were discussed. History of PE: On anticoagulation followed by primary care.  Benefits risks of anticoagulation discussed. Dyslipidemia: On lipid-lowering medications.  Lipids discussed.  Diet emphasized. Renal insufficiency: Stable managed by primary care.  I cautioned her about taking nephrotoxic drugs such as NSAIDs over-the-counter.  She understands. Patient will be seen in follow-up appointment in 6 months or earlier if the patient has any concerns.    Medication Adjustments/Labs and Tests Ordered: Current medicines are reviewed at length with the patient today.  Concerns regarding medicines are outlined above.  No orders of the defined types were placed in this encounter.  Meds ordered this encounter  Medications   isosorbide  mononitrate (IMDUR ) 30 MG 24 hr tablet    Sig: Take 1 tablet (30 mg total) by mouth daily.    Dispense:  90 tablet    Refill:  3     No chief complaint on file.    History of Present Illness:    Rachael [name] is a 68 y.o. female.  Patient has past medical history of pulmonary embolism and history of stroke.  She has history of essential hypertension mixed dyslipidemia.  Renal insufficiency.  She  leads a sedentary lifestyle.  She is obese.  She denies any chest pain orthopnea or PND.  She is here for routine visit.  At the time of my evaluation, the patient is alert awake oriented and in no distress.    Past Medical History:  Diagnosis Date    RV dilation by echocardiogram in setting of PE 01/23/2017   Acute deep vein thrombosis (DVT) of both lower extremities (HCC)    Acute renal failure superimposed on stage 5 chronic kidney disease, not on chronic dialysis (HCC) 09/22/2016   Acute respiratory failure with hypoxia (HCC)    AKI (acute kidney injury) 09/22/2016   Bacteremia 10/23/2016   BMI 39.0-39.9,adult    Chest pain 02/14/2024   Chest pain at rest    CHF (congestive heart failure) (HCC)    Heart cath in 2006   CHF exacerbation (HCC) 01/16/2017   Chronic heart failure with preserved ejection fraction (HFpEF) (HCC) 02/15/2024   Chronic kidney disease (CKD), stage V (HCC) 10/23/2016   CKD (chronic kidney disease)    Baseline creat 1.4-1.7   CKD (chronic kidney disease), stage IV (HCC) 01/16/2017   Community acquired pneumonia    CVA (cerebral vascular accident) (HCC)    No residual deficit     Depression 10/24/2016   Dyspnea    Elevated troponin 01/16/2017   Hemodialysis catheter infection    History of CVA (cerebrovascular accident) 10/24/2016   History of pulmonary embolism 12/02/2023   HTN (hypertension)  Hyperlipidemia 10/24/2016   Hypotension 01/21/2017   Hypothyroidism    Interstitial nephritis    Lupus anticoagulant positive 08/07/2021   Migraines    Nausea and vomiting 09/22/2016   Orthostasis 01/23/2017   Paroxysmal atrial fibrillation (HCC) 10/30/2016   Pruritus 05/16/2015   Pulmonary embolus (HCC)    Sepsis (HCC) 01/16/2017   SOB (shortness of breath)    Supratherapeutic INR 01/25/2017   Urticaria 05/16/2015   UTI (urinary tract infection) 09/22/2016    Past Surgical History:  Procedure Laterality Date   CARDIAC CATHETERIZATION  2006    CHOLECYSTECTOMY     IR GENERIC HISTORICAL  09/29/2016   IR FLUORO GUIDE CV LINE RIGHT 09/29/2016 [name], MD [facility]   IR GENERIC HISTORICAL  09/29/2016   IR US  GUIDE VASC ACCESS RIGHT [facility]   TEE WITHOUT CARDIOVERSION N/A 10/27/2016   Procedure: TRANSESOPHAGEAL ECHOCARDIOGRAM (TEE);  Surgeon: [name], MD;  Location: [facility] ENDOSCOPY;  Service: Cardiovascular;  Laterality: N/A;    Current Medications: Active Medications[1]   Allergies:   Lovenox  [enoxaparin ], Pantoprazole , Proton pump inhibitors, Penicillins, and Sulfa antibiotics   Social History   Socioeconomic History   Marital status: Single    Spouse name: Not on file   Number of children: Not on file   Years of education: Not on file   Highest education level: Not on file  Occupational History   Not on file  Tobacco Use   Smoking status: Never   Smokeless tobacco: Never  Vaping Use   Vaping status: Never Used  Substance and Sexual Activity   Alcohol use: No   Drug use: Never   Sexual activity: Not Currently  Other Topics Concern   Not on file  Social History Narrative   Not on file   Social Drivers of Health   Tobacco Use: Low Risk (08/24/2024)   Patient History    Smoking Tobacco Use: Never    Smokeless Tobacco Use: Never    Passive Exposure: Not on file  Financial Resource Strain: Not on file  Food Insecurity: No Food Insecurity (02/16/2024)   Hunger Vital Sign    Worried About Running Out of Food in the Last Year: Never true    Ran Out of Food in the Last Year: Never true  Transportation Needs: No Transportation Needs (02/16/2024)   PRAPARE - Administrator, Civil Service (Medical): No    Lack of Transportation (Non-Medical): No  Physical Activity: Not on file  Stress: Not on file  Social Connections: Moderately Isolated (02/16/2024)   Social Connection and Isolation Panel    Frequency of Communication with Friends and Family: More than three times a week     Frequency of Social Gatherings with Friends and Family: More than three times a week    Attends Religious Services: More than 4 times per year    Active Member of [employer] or Organizations: No    Attends Banker Meetings: Never    Marital Status: Divorced  Depression (PHQ2-9): Not on file  Alcohol Screen: Not on file  Housing: Low Risk (02/16/2024)   Housing Stability Vital Sign    Unable to Pay for Housing in the Last Year: No    Number of Times Moved in the Last Year: 1    Homeless in the Last Year: No  Utilities: Not At Risk (02/16/2024)   AHC Utilities    Threatened with loss of utilities: No  Health Literacy: Not on file  Family History: The patient's family history includes Coronary artery disease in her father and mother; Hypertension in her brother, father, mother, and sister; Stroke in her mother.  ROS:   Please see the history of present illness.    All other systems reviewed and are negative.  EKGs/Labs/Other Studies Reviewed:    The following studies were reviewed today: .SABRA   I discussed my findings with the patient at length   Recent Labs: 09/04/2023: TSH 1.50 02/14/2024: ALT 10; BUN 20; Creatinine, Ser 2.35; Potassium 3.8; Sodium 139 02/16/2024: Hemoglobin 12.5; Platelets 155  Recent Lipid Panel No results found for: CHOL, TRIG, HDL, CHOLHDL, VLDL, LDLCALC, LDLDIRECT  Physical Exam:    VS:  BP 138/88   Pulse 76   Ht 5' 2.6 (1.59 m)   Wt 213 lb 9.6 oz (96.9 kg)   SpO2 96%   BMI 38.32 kg/m     Wt Readings from Last 3 Encounters:  08/24/24 213 lb 9.6 oz (96.9 kg)  02/15/24 215 lb 13.3 oz (97.9 kg)  12/02/23 215 lb 3.2 oz (97.6 kg)     GEN: Patient is in no acute distress HEENT: Normal NECK: No JVD; No carotid bruits LYMPHATICS: No lymphadenopathy CARDIAC: Hear sounds regular, 2/6 systolic murmur at the apex. RESPIRATORY:  Clear to auscultation without rales, wheezing or rhonchi  ABDOMEN: Soft, non-tender,  non-distended MUSCULOSKELETAL:  No edema; No deformity  SKIN: Warm and dry NEUROLOGIC:  Alert and oriented x 3 PSYCHIATRIC:  Normal affect   Signed, Jennifer JONELLE Crape, MD  08/24/2024 3:41 PM     [facility]      [1]  Current Meds  Medication Sig   Cholecalciferol (VITAMIN D) 50 MCG (2000 UT) CAPS Take 2,000 Units by mouth daily.   citalopram  (CELEXA ) 40 MG tablet Take 40 mg by mouth daily.   Cyanocobalamin  (B-12 COMPLIANCE INJECTION) 1000 MCG/ML KIT Inject 1,000 mcg as directed every 30 (thirty) days.   famotidine  (PEPCID ) 20 MG tablet Take 20 mg by mouth daily.   furosemide  (LASIX ) 40 MG tablet Take 40 mg by mouth daily as needed for fluid or edema.   levothyroxine  (SYNTHROID ) 100 MCG tablet Take 100 mcg by mouth daily.   Melatonin 10 MG TABS Take 10 mg by mouth as needed (sleep).   Multiple Vitamin (MULTIVITAMIN WITH MINERALS) TABS tablet Take 1 tablet by mouth daily.   nitroGLYCERIN  (NITROSTAT ) 0.4 MG SL tablet Place 1 tablet (0.4 mg total) under the tongue every 5 (five) minutes as needed for chest pain.   rosuvastatin  (CRESTOR ) 5 MG tablet Take 5 mg by mouth daily.   topiramate  (TOPAMAX ) 25 MG tablet Take 25 mg by mouth daily.   VITAMIN E PO Take 1 capsule by mouth daily.   XARELTO  15 MG TABS tablet Take 15 mg by mouth daily.
